# Patient Record
Sex: Female | Born: 1945 | Race: White | Hispanic: No | Smoking: Never smoker
Health system: Southern US, Community
[De-identification: ages and names within clinical notes are randomized; demographics above are authoritative.]

## PROBLEM LIST (undated history)

## (undated) DIAGNOSIS — E119 Type 2 diabetes mellitus without complications: Secondary | ICD-10-CM

## (undated) DIAGNOSIS — E785 Hyperlipidemia, unspecified: Secondary | ICD-10-CM

## (undated) DIAGNOSIS — L570 Actinic keratosis: Secondary | ICD-10-CM

## (undated) DIAGNOSIS — Z6841 Body Mass Index (BMI) 40.0 and over, adult: Secondary | ICD-10-CM

## (undated) DIAGNOSIS — Z87442 Personal history of urinary calculi: Secondary | ICD-10-CM

## (undated) DIAGNOSIS — K219 Gastro-esophageal reflux disease without esophagitis: Secondary | ICD-10-CM

## (undated) DIAGNOSIS — D649 Anemia, unspecified: Secondary | ICD-10-CM

## (undated) DIAGNOSIS — R011 Cardiac murmur, unspecified: Secondary | ICD-10-CM

## (undated) DIAGNOSIS — I10 Essential (primary) hypertension: Secondary | ICD-10-CM

## (undated) DIAGNOSIS — I350 Nonrheumatic aortic (valve) stenosis: Secondary | ICD-10-CM

## (undated) DIAGNOSIS — F419 Anxiety disorder, unspecified: Secondary | ICD-10-CM

## (undated) DIAGNOSIS — Z794 Long term (current) use of insulin: Secondary | ICD-10-CM

## (undated) DIAGNOSIS — H532 Diplopia: Secondary | ICD-10-CM

## (undated) DIAGNOSIS — I499 Cardiac arrhythmia, unspecified: Secondary | ICD-10-CM

## (undated) DIAGNOSIS — I4891 Unspecified atrial fibrillation: Secondary | ICD-10-CM

## (undated) HISTORY — DX: Hyperlipidemia, unspecified: E78.5

## (undated) HISTORY — DX: Diplopia: H53.2

## (undated) HISTORY — DX: Anxiety disorder, unspecified: F41.9

## (undated) HISTORY — DX: Type 2 diabetes mellitus without complications: Z79.4

## (undated) HISTORY — DX: Type 2 diabetes mellitus without complications: E11.9

## (undated) HISTORY — DX: Actinic keratosis: L57.0

## (undated) HISTORY — DX: Essential (primary) hypertension: I10

## (undated) HISTORY — DX: Gastro-esophageal reflux disease without esophagitis: K21.9

## (undated) HISTORY — DX: Morbid (severe) obesity due to excess calories: E66.01

## (undated) HISTORY — DX: Body Mass Index (BMI) 40.0 and over, adult: Z684

---

## 1995-03-30 HISTORY — PX: BREAST BIOPSY: SHX20

## 1997-07-30 ENCOUNTER — Other Ambulatory Visit: Admission: RE | Admit: 1997-07-30 | Discharge: 1997-07-30 | Payer: Self-pay | Admitting: Obstetrics and Gynecology

## 1997-10-27 ENCOUNTER — Emergency Department (HOSPITAL_COMMUNITY): Admission: EM | Admit: 1997-10-27 | Discharge: 1997-10-27 | Payer: Self-pay | Admitting: Emergency Medicine

## 1997-10-28 ENCOUNTER — Ambulatory Visit (HOSPITAL_COMMUNITY): Admission: RE | Admit: 1997-10-28 | Discharge: 1997-10-28 | Payer: Self-pay | Admitting: Emergency Medicine

## 1997-11-25 ENCOUNTER — Ambulatory Visit (HOSPITAL_COMMUNITY): Admission: RE | Admit: 1997-11-25 | Discharge: 1997-11-25 | Payer: Self-pay | Admitting: Urology

## 1998-08-01 ENCOUNTER — Other Ambulatory Visit: Admission: RE | Admit: 1998-08-01 | Discharge: 1998-08-01 | Payer: Self-pay | Admitting: Obstetrics and Gynecology

## 1999-03-03 ENCOUNTER — Encounter: Admission: RE | Admit: 1999-03-03 | Discharge: 1999-03-03 | Payer: Self-pay | Admitting: Family Medicine

## 1999-03-03 ENCOUNTER — Encounter: Payer: Self-pay | Admitting: Family Medicine

## 1999-08-03 ENCOUNTER — Other Ambulatory Visit: Admission: RE | Admit: 1999-08-03 | Discharge: 1999-08-03 | Payer: Self-pay | Admitting: Obstetrics and Gynecology

## 1999-09-18 ENCOUNTER — Other Ambulatory Visit: Admission: RE | Admit: 1999-09-18 | Discharge: 1999-09-18 | Payer: Self-pay | Admitting: Obstetrics and Gynecology

## 1999-09-18 ENCOUNTER — Encounter (INDEPENDENT_AMBULATORY_CARE_PROVIDER_SITE_OTHER): Payer: Self-pay

## 2000-03-03 ENCOUNTER — Encounter: Admission: RE | Admit: 2000-03-03 | Discharge: 2000-03-03 | Payer: Self-pay | Admitting: Obstetrics and Gynecology

## 2000-03-03 ENCOUNTER — Encounter: Payer: Self-pay | Admitting: Obstetrics and Gynecology

## 2000-08-08 ENCOUNTER — Other Ambulatory Visit: Admission: RE | Admit: 2000-08-08 | Discharge: 2000-08-08 | Payer: Self-pay | Admitting: Obstetrics and Gynecology

## 2001-03-14 ENCOUNTER — Encounter: Payer: Self-pay | Admitting: Obstetrics and Gynecology

## 2001-03-14 ENCOUNTER — Encounter: Admission: RE | Admit: 2001-03-14 | Discharge: 2001-03-14 | Payer: Self-pay | Admitting: Obstetrics and Gynecology

## 2001-07-24 ENCOUNTER — Encounter: Admission: RE | Admit: 2001-07-24 | Discharge: 2001-10-22 | Payer: Self-pay | Admitting: Family Medicine

## 2001-09-01 ENCOUNTER — Other Ambulatory Visit: Admission: RE | Admit: 2001-09-01 | Discharge: 2001-09-01 | Payer: Self-pay | Admitting: Obstetrics and Gynecology

## 2002-03-19 ENCOUNTER — Encounter: Payer: Self-pay | Admitting: Obstetrics and Gynecology

## 2002-03-19 ENCOUNTER — Encounter: Admission: RE | Admit: 2002-03-19 | Discharge: 2002-03-19 | Payer: Self-pay | Admitting: Obstetrics and Gynecology

## 2002-10-22 ENCOUNTER — Other Ambulatory Visit: Admission: RE | Admit: 2002-10-22 | Discharge: 2002-10-22 | Payer: Self-pay | Admitting: Obstetrics and Gynecology

## 2003-03-21 ENCOUNTER — Encounter: Admission: RE | Admit: 2003-03-21 | Discharge: 2003-03-21 | Payer: Self-pay | Admitting: Obstetrics and Gynecology

## 2003-11-12 ENCOUNTER — Other Ambulatory Visit: Admission: RE | Admit: 2003-11-12 | Discharge: 2003-11-12 | Payer: Self-pay | Admitting: Obstetrics and Gynecology

## 2004-03-27 ENCOUNTER — Ambulatory Visit (HOSPITAL_COMMUNITY): Admission: RE | Admit: 2004-03-27 | Discharge: 2004-03-27 | Payer: Self-pay | Admitting: Family Medicine

## 2004-12-09 ENCOUNTER — Other Ambulatory Visit: Admission: RE | Admit: 2004-12-09 | Discharge: 2004-12-09 | Payer: Self-pay | Admitting: Obstetrics and Gynecology

## 2005-04-01 ENCOUNTER — Ambulatory Visit (HOSPITAL_COMMUNITY): Admission: RE | Admit: 2005-04-01 | Discharge: 2005-04-01 | Payer: Self-pay | Admitting: Obstetrics and Gynecology

## 2007-02-09 ENCOUNTER — Ambulatory Visit (HOSPITAL_COMMUNITY): Admission: RE | Admit: 2007-02-09 | Discharge: 2007-02-09 | Payer: Self-pay | Admitting: Family Medicine

## 2008-02-12 ENCOUNTER — Ambulatory Visit (HOSPITAL_COMMUNITY): Admission: RE | Admit: 2008-02-12 | Discharge: 2008-02-12 | Payer: Self-pay | Admitting: Family Medicine

## 2009-02-12 ENCOUNTER — Ambulatory Visit (HOSPITAL_COMMUNITY): Admission: RE | Admit: 2009-02-12 | Discharge: 2009-02-12 | Payer: Self-pay | Admitting: Family Medicine

## 2011-07-12 DIAGNOSIS — G619 Inflammatory polyneuropathy, unspecified: Secondary | ICD-10-CM | POA: Diagnosis not present

## 2011-07-12 DIAGNOSIS — E119 Type 2 diabetes mellitus without complications: Secondary | ICD-10-CM | POA: Diagnosis not present

## 2011-07-12 DIAGNOSIS — G622 Polyneuropathy due to other toxic agents: Secondary | ICD-10-CM | POA: Diagnosis not present

## 2011-07-12 DIAGNOSIS — K219 Gastro-esophageal reflux disease without esophagitis: Secondary | ICD-10-CM | POA: Diagnosis not present

## 2011-07-12 DIAGNOSIS — I10 Essential (primary) hypertension: Secondary | ICD-10-CM | POA: Diagnosis not present

## 2011-07-12 DIAGNOSIS — E782 Mixed hyperlipidemia: Secondary | ICD-10-CM | POA: Diagnosis not present

## 2011-10-21 DIAGNOSIS — Z85828 Personal history of other malignant neoplasm of skin: Secondary | ICD-10-CM | POA: Diagnosis not present

## 2011-11-09 DIAGNOSIS — E119 Type 2 diabetes mellitus without complications: Secondary | ICD-10-CM | POA: Diagnosis not present

## 2011-11-09 DIAGNOSIS — H25019 Cortical age-related cataract, unspecified eye: Secondary | ICD-10-CM | POA: Diagnosis not present

## 2011-11-09 DIAGNOSIS — H2510 Age-related nuclear cataract, unspecified eye: Secondary | ICD-10-CM | POA: Diagnosis not present

## 2012-01-10 DIAGNOSIS — E782 Mixed hyperlipidemia: Secondary | ICD-10-CM | POA: Diagnosis not present

## 2012-01-10 DIAGNOSIS — G622 Polyneuropathy due to other toxic agents: Secondary | ICD-10-CM | POA: Diagnosis not present

## 2012-01-10 DIAGNOSIS — K219 Gastro-esophageal reflux disease without esophagitis: Secondary | ICD-10-CM | POA: Diagnosis not present

## 2012-01-10 DIAGNOSIS — Z23 Encounter for immunization: Secondary | ICD-10-CM | POA: Diagnosis not present

## 2012-01-10 DIAGNOSIS — G619 Inflammatory polyneuropathy, unspecified: Secondary | ICD-10-CM | POA: Diagnosis not present

## 2012-01-10 DIAGNOSIS — I10 Essential (primary) hypertension: Secondary | ICD-10-CM | POA: Diagnosis not present

## 2012-01-10 DIAGNOSIS — E119 Type 2 diabetes mellitus without complications: Secondary | ICD-10-CM | POA: Diagnosis not present

## 2012-04-18 DIAGNOSIS — Z124 Encounter for screening for malignant neoplasm of cervix: Secondary | ICD-10-CM | POA: Diagnosis not present

## 2012-04-18 DIAGNOSIS — Z1231 Encounter for screening mammogram for malignant neoplasm of breast: Secondary | ICD-10-CM | POA: Diagnosis not present

## 2012-06-30 DIAGNOSIS — E119 Type 2 diabetes mellitus without complications: Secondary | ICD-10-CM | POA: Diagnosis not present

## 2012-06-30 DIAGNOSIS — E782 Mixed hyperlipidemia: Secondary | ICD-10-CM | POA: Diagnosis not present

## 2012-06-30 DIAGNOSIS — I10 Essential (primary) hypertension: Secondary | ICD-10-CM | POA: Diagnosis not present

## 2012-10-23 DIAGNOSIS — L819 Disorder of pigmentation, unspecified: Secondary | ICD-10-CM | POA: Diagnosis not present

## 2012-10-23 DIAGNOSIS — D235 Other benign neoplasm of skin of trunk: Secondary | ICD-10-CM | POA: Diagnosis not present

## 2012-10-23 DIAGNOSIS — L570 Actinic keratosis: Secondary | ICD-10-CM | POA: Diagnosis not present

## 2012-10-23 DIAGNOSIS — Z85828 Personal history of other malignant neoplasm of skin: Secondary | ICD-10-CM | POA: Diagnosis not present

## 2012-11-08 DIAGNOSIS — E119 Type 2 diabetes mellitus without complications: Secondary | ICD-10-CM | POA: Diagnosis not present

## 2013-01-01 DIAGNOSIS — E782 Mixed hyperlipidemia: Secondary | ICD-10-CM | POA: Diagnosis not present

## 2013-01-01 DIAGNOSIS — I10 Essential (primary) hypertension: Secondary | ICD-10-CM | POA: Diagnosis not present

## 2013-01-01 DIAGNOSIS — Z23 Encounter for immunization: Secondary | ICD-10-CM | POA: Diagnosis not present

## 2013-01-01 DIAGNOSIS — IMO0001 Reserved for inherently not codable concepts without codable children: Secondary | ICD-10-CM | POA: Diagnosis not present

## 2013-04-24 DIAGNOSIS — Z01419 Encounter for gynecological examination (general) (routine) without abnormal findings: Secondary | ICD-10-CM | POA: Diagnosis not present

## 2013-04-24 DIAGNOSIS — Z1231 Encounter for screening mammogram for malignant neoplasm of breast: Secondary | ICD-10-CM | POA: Diagnosis not present

## 2013-04-25 DIAGNOSIS — M899 Disorder of bone, unspecified: Secondary | ICD-10-CM | POA: Diagnosis not present

## 2013-04-25 DIAGNOSIS — M949 Disorder of cartilage, unspecified: Secondary | ICD-10-CM | POA: Diagnosis not present

## 2013-04-25 DIAGNOSIS — N959 Unspecified menopausal and perimenopausal disorder: Secondary | ICD-10-CM | POA: Diagnosis not present

## 2013-07-09 DIAGNOSIS — I10 Essential (primary) hypertension: Secondary | ICD-10-CM | POA: Diagnosis not present

## 2013-07-09 DIAGNOSIS — K219 Gastro-esophageal reflux disease without esophagitis: Secondary | ICD-10-CM | POA: Diagnosis not present

## 2013-07-09 DIAGNOSIS — E1142 Type 2 diabetes mellitus with diabetic polyneuropathy: Secondary | ICD-10-CM | POA: Diagnosis not present

## 2013-07-09 DIAGNOSIS — E1149 Type 2 diabetes mellitus with other diabetic neurological complication: Secondary | ICD-10-CM | POA: Diagnosis not present

## 2013-07-09 DIAGNOSIS — E782 Mixed hyperlipidemia: Secondary | ICD-10-CM | POA: Diagnosis not present

## 2013-07-09 DIAGNOSIS — Z23 Encounter for immunization: Secondary | ICD-10-CM | POA: Diagnosis not present

## 2013-10-24 DIAGNOSIS — D235 Other benign neoplasm of skin of trunk: Secondary | ICD-10-CM | POA: Diagnosis not present

## 2013-10-24 DIAGNOSIS — L570 Actinic keratosis: Secondary | ICD-10-CM | POA: Diagnosis not present

## 2013-10-24 DIAGNOSIS — Z85828 Personal history of other malignant neoplasm of skin: Secondary | ICD-10-CM | POA: Diagnosis not present

## 2013-10-24 DIAGNOSIS — L819 Disorder of pigmentation, unspecified: Secondary | ICD-10-CM | POA: Diagnosis not present

## 2013-11-08 DIAGNOSIS — E119 Type 2 diabetes mellitus without complications: Secondary | ICD-10-CM | POA: Diagnosis not present

## 2014-01-15 DIAGNOSIS — E780 Pure hypercholesterolemia: Secondary | ICD-10-CM | POA: Diagnosis not present

## 2014-01-15 DIAGNOSIS — E1142 Type 2 diabetes mellitus with diabetic polyneuropathy: Secondary | ICD-10-CM | POA: Diagnosis not present

## 2014-01-15 DIAGNOSIS — I10 Essential (primary) hypertension: Secondary | ICD-10-CM | POA: Diagnosis not present

## 2014-02-06 DIAGNOSIS — Z23 Encounter for immunization: Secondary | ICD-10-CM | POA: Diagnosis not present

## 2014-02-28 DIAGNOSIS — J029 Acute pharyngitis, unspecified: Secondary | ICD-10-CM | POA: Diagnosis not present

## 2014-04-15 DIAGNOSIS — L570 Actinic keratosis: Secondary | ICD-10-CM | POA: Diagnosis not present

## 2014-05-07 DIAGNOSIS — N390 Urinary tract infection, site not specified: Secondary | ICD-10-CM | POA: Diagnosis not present

## 2014-05-15 DIAGNOSIS — N390 Urinary tract infection, site not specified: Secondary | ICD-10-CM | POA: Diagnosis not present

## 2014-06-11 DIAGNOSIS — Z124 Encounter for screening for malignant neoplasm of cervix: Secondary | ICD-10-CM | POA: Diagnosis not present

## 2014-06-11 DIAGNOSIS — Z1231 Encounter for screening mammogram for malignant neoplasm of breast: Secondary | ICD-10-CM | POA: Diagnosis not present

## 2014-06-11 DIAGNOSIS — Z6841 Body Mass Index (BMI) 40.0 and over, adult: Secondary | ICD-10-CM | POA: Diagnosis not present

## 2014-07-08 DIAGNOSIS — E1142 Type 2 diabetes mellitus with diabetic polyneuropathy: Secondary | ICD-10-CM | POA: Diagnosis not present

## 2014-07-08 DIAGNOSIS — F419 Anxiety disorder, unspecified: Secondary | ICD-10-CM | POA: Diagnosis not present

## 2014-07-08 DIAGNOSIS — E782 Mixed hyperlipidemia: Secondary | ICD-10-CM | POA: Diagnosis not present

## 2014-07-08 DIAGNOSIS — E119 Type 2 diabetes mellitus without complications: Secondary | ICD-10-CM | POA: Diagnosis not present

## 2014-07-08 DIAGNOSIS — I10 Essential (primary) hypertension: Secondary | ICD-10-CM | POA: Diagnosis not present

## 2014-07-08 DIAGNOSIS — J302 Other seasonal allergic rhinitis: Secondary | ICD-10-CM | POA: Diagnosis not present

## 2014-07-08 DIAGNOSIS — K219 Gastro-esophageal reflux disease without esophagitis: Secondary | ICD-10-CM | POA: Diagnosis not present

## 2014-07-23 DIAGNOSIS — Z1389 Encounter for screening for other disorder: Secondary | ICD-10-CM | POA: Diagnosis not present

## 2014-07-23 DIAGNOSIS — R350 Frequency of micturition: Secondary | ICD-10-CM | POA: Diagnosis not present

## 2014-07-23 DIAGNOSIS — R310 Gross hematuria: Secondary | ICD-10-CM | POA: Diagnosis not present

## 2014-07-23 DIAGNOSIS — N390 Urinary tract infection, site not specified: Secondary | ICD-10-CM | POA: Diagnosis not present

## 2014-10-30 DIAGNOSIS — E119 Type 2 diabetes mellitus without complications: Secondary | ICD-10-CM | POA: Diagnosis not present

## 2014-10-30 DIAGNOSIS — R319 Hematuria, unspecified: Secondary | ICD-10-CM | POA: Diagnosis not present

## 2014-10-30 DIAGNOSIS — E782 Mixed hyperlipidemia: Secondary | ICD-10-CM | POA: Diagnosis not present

## 2014-11-14 DIAGNOSIS — H2513 Age-related nuclear cataract, bilateral: Secondary | ICD-10-CM | POA: Diagnosis not present

## 2014-12-23 DIAGNOSIS — L570 Actinic keratosis: Secondary | ICD-10-CM | POA: Diagnosis not present

## 2014-12-23 DIAGNOSIS — L821 Other seborrheic keratosis: Secondary | ICD-10-CM | POA: Diagnosis not present

## 2014-12-23 DIAGNOSIS — D225 Melanocytic nevi of trunk: Secondary | ICD-10-CM | POA: Diagnosis not present

## 2014-12-23 DIAGNOSIS — L814 Other melanin hyperpigmentation: Secondary | ICD-10-CM | POA: Diagnosis not present

## 2014-12-30 DIAGNOSIS — E782 Mixed hyperlipidemia: Secondary | ICD-10-CM | POA: Diagnosis not present

## 2014-12-30 DIAGNOSIS — Z1211 Encounter for screening for malignant neoplasm of colon: Secondary | ICD-10-CM | POA: Diagnosis not present

## 2014-12-30 DIAGNOSIS — K219 Gastro-esophageal reflux disease without esophagitis: Secondary | ICD-10-CM | POA: Diagnosis not present

## 2014-12-30 DIAGNOSIS — F419 Anxiety disorder, unspecified: Secondary | ICD-10-CM | POA: Diagnosis not present

## 2014-12-30 DIAGNOSIS — Z23 Encounter for immunization: Secondary | ICD-10-CM | POA: Diagnosis not present

## 2014-12-30 DIAGNOSIS — I10 Essential (primary) hypertension: Secondary | ICD-10-CM | POA: Diagnosis not present

## 2014-12-30 DIAGNOSIS — E1142 Type 2 diabetes mellitus with diabetic polyneuropathy: Secondary | ICD-10-CM | POA: Diagnosis not present

## 2015-02-26 DIAGNOSIS — K5730 Diverticulosis of large intestine without perforation or abscess without bleeding: Secondary | ICD-10-CM | POA: Diagnosis not present

## 2015-02-26 DIAGNOSIS — K5520 Angiodysplasia of colon without hemorrhage: Secondary | ICD-10-CM | POA: Diagnosis not present

## 2015-02-26 DIAGNOSIS — K640 First degree hemorrhoids: Secondary | ICD-10-CM | POA: Diagnosis not present

## 2015-02-26 DIAGNOSIS — Z1211 Encounter for screening for malignant neoplasm of colon: Secondary | ICD-10-CM | POA: Diagnosis not present

## 2015-05-07 DIAGNOSIS — I10 Essential (primary) hypertension: Secondary | ICD-10-CM | POA: Diagnosis not present

## 2015-05-07 DIAGNOSIS — Z7984 Long term (current) use of oral hypoglycemic drugs: Secondary | ICD-10-CM | POA: Diagnosis not present

## 2015-05-07 DIAGNOSIS — Z6841 Body Mass Index (BMI) 40.0 and over, adult: Secondary | ICD-10-CM | POA: Diagnosis not present

## 2015-05-07 DIAGNOSIS — K219 Gastro-esophageal reflux disease without esophagitis: Secondary | ICD-10-CM | POA: Diagnosis not present

## 2015-05-07 DIAGNOSIS — E1142 Type 2 diabetes mellitus with diabetic polyneuropathy: Secondary | ICD-10-CM | POA: Diagnosis not present

## 2015-07-21 DIAGNOSIS — E782 Mixed hyperlipidemia: Secondary | ICD-10-CM | POA: Diagnosis not present

## 2015-07-21 DIAGNOSIS — E1142 Type 2 diabetes mellitus with diabetic polyneuropathy: Secondary | ICD-10-CM | POA: Diagnosis not present

## 2015-07-21 DIAGNOSIS — K219 Gastro-esophageal reflux disease without esophagitis: Secondary | ICD-10-CM | POA: Diagnosis not present

## 2015-07-21 DIAGNOSIS — Z23 Encounter for immunization: Secondary | ICD-10-CM | POA: Diagnosis not present

## 2015-07-21 DIAGNOSIS — Z6841 Body Mass Index (BMI) 40.0 and over, adult: Secondary | ICD-10-CM | POA: Diagnosis not present

## 2015-07-21 DIAGNOSIS — F411 Generalized anxiety disorder: Secondary | ICD-10-CM | POA: Diagnosis not present

## 2015-07-21 DIAGNOSIS — I10 Essential (primary) hypertension: Secondary | ICD-10-CM | POA: Diagnosis not present

## 2015-07-21 DIAGNOSIS — J309 Allergic rhinitis, unspecified: Secondary | ICD-10-CM | POA: Diagnosis not present

## 2015-08-04 DIAGNOSIS — N390 Urinary tract infection, site not specified: Secondary | ICD-10-CM | POA: Diagnosis not present

## 2015-08-06 DIAGNOSIS — Z1231 Encounter for screening mammogram for malignant neoplasm of breast: Secondary | ICD-10-CM | POA: Diagnosis not present

## 2015-08-06 DIAGNOSIS — M8588 Other specified disorders of bone density and structure, other site: Secondary | ICD-10-CM | POA: Diagnosis not present

## 2015-08-06 DIAGNOSIS — Z01419 Encounter for gynecological examination (general) (routine) without abnormal findings: Secondary | ICD-10-CM | POA: Diagnosis not present

## 2015-08-06 DIAGNOSIS — Z6841 Body Mass Index (BMI) 40.0 and over, adult: Secondary | ICD-10-CM | POA: Diagnosis not present

## 2015-08-06 DIAGNOSIS — N958 Other specified menopausal and perimenopausal disorders: Secondary | ICD-10-CM | POA: Diagnosis not present

## 2015-08-12 DIAGNOSIS — R6883 Chills (without fever): Secondary | ICD-10-CM | POA: Diagnosis not present

## 2015-11-18 DIAGNOSIS — E119 Type 2 diabetes mellitus without complications: Secondary | ICD-10-CM | POA: Diagnosis not present

## 2015-11-18 DIAGNOSIS — Z0100 Encounter for examination of eyes and vision without abnormal findings: Secondary | ICD-10-CM | POA: Diagnosis not present

## 2015-11-18 DIAGNOSIS — H25013 Cortical age-related cataract, bilateral: Secondary | ICD-10-CM | POA: Diagnosis not present

## 2015-11-18 DIAGNOSIS — H2513 Age-related nuclear cataract, bilateral: Secondary | ICD-10-CM | POA: Diagnosis not present

## 2016-01-14 DIAGNOSIS — I10 Essential (primary) hypertension: Secondary | ICD-10-CM | POA: Diagnosis not present

## 2016-01-14 DIAGNOSIS — Z6841 Body Mass Index (BMI) 40.0 and over, adult: Secondary | ICD-10-CM | POA: Diagnosis not present

## 2016-01-14 DIAGNOSIS — E782 Mixed hyperlipidemia: Secondary | ICD-10-CM | POA: Diagnosis not present

## 2016-01-14 DIAGNOSIS — E1142 Type 2 diabetes mellitus with diabetic polyneuropathy: Secondary | ICD-10-CM | POA: Diagnosis not present

## 2016-01-14 DIAGNOSIS — F411 Generalized anxiety disorder: Secondary | ICD-10-CM | POA: Diagnosis not present

## 2016-01-14 DIAGNOSIS — Z23 Encounter for immunization: Secondary | ICD-10-CM | POA: Diagnosis not present

## 2016-01-14 DIAGNOSIS — K219 Gastro-esophageal reflux disease without esophagitis: Secondary | ICD-10-CM | POA: Diagnosis not present

## 2016-04-21 DIAGNOSIS — E1142 Type 2 diabetes mellitus with diabetic polyneuropathy: Secondary | ICD-10-CM | POA: Diagnosis not present

## 2016-07-14 DIAGNOSIS — Z794 Long term (current) use of insulin: Secondary | ICD-10-CM | POA: Diagnosis not present

## 2016-07-14 DIAGNOSIS — E1142 Type 2 diabetes mellitus with diabetic polyneuropathy: Secondary | ICD-10-CM | POA: Diagnosis not present

## 2016-07-14 DIAGNOSIS — Z6841 Body Mass Index (BMI) 40.0 and over, adult: Secondary | ICD-10-CM | POA: Diagnosis not present

## 2016-07-14 DIAGNOSIS — Z0000 Encounter for general adult medical examination without abnormal findings: Secondary | ICD-10-CM | POA: Diagnosis not present

## 2016-07-14 DIAGNOSIS — I10 Essential (primary) hypertension: Secondary | ICD-10-CM | POA: Diagnosis not present

## 2016-07-14 DIAGNOSIS — E782 Mixed hyperlipidemia: Secondary | ICD-10-CM | POA: Diagnosis not present

## 2016-07-14 DIAGNOSIS — F411 Generalized anxiety disorder: Secondary | ICD-10-CM | POA: Diagnosis not present

## 2016-07-14 DIAGNOSIS — K219 Gastro-esophageal reflux disease without esophagitis: Secondary | ICD-10-CM | POA: Diagnosis not present

## 2016-11-17 DIAGNOSIS — H5203 Hypermetropia, bilateral: Secondary | ICD-10-CM | POA: Diagnosis not present

## 2016-11-17 DIAGNOSIS — H524 Presbyopia: Secondary | ICD-10-CM | POA: Diagnosis not present

## 2016-11-17 DIAGNOSIS — E119 Type 2 diabetes mellitus without complications: Secondary | ICD-10-CM | POA: Diagnosis not present

## 2016-11-17 DIAGNOSIS — H2513 Age-related nuclear cataract, bilateral: Secondary | ICD-10-CM | POA: Diagnosis not present

## 2016-12-13 DIAGNOSIS — Z6841 Body Mass Index (BMI) 40.0 and over, adult: Secondary | ICD-10-CM | POA: Diagnosis not present

## 2016-12-13 DIAGNOSIS — R351 Nocturia: Secondary | ICD-10-CM | POA: Diagnosis not present

## 2016-12-13 DIAGNOSIS — R3915 Urgency of urination: Secondary | ICD-10-CM | POA: Diagnosis not present

## 2016-12-13 DIAGNOSIS — R350 Frequency of micturition: Secondary | ICD-10-CM | POA: Diagnosis not present

## 2016-12-13 DIAGNOSIS — Z1231 Encounter for screening mammogram for malignant neoplasm of breast: Secondary | ICD-10-CM | POA: Diagnosis not present

## 2016-12-13 DIAGNOSIS — Z01419 Encounter for gynecological examination (general) (routine) without abnormal findings: Secondary | ICD-10-CM | POA: Diagnosis not present

## 2016-12-29 DIAGNOSIS — I200 Unstable angina: Secondary | ICD-10-CM | POA: Diagnosis not present

## 2016-12-30 ENCOUNTER — Telehealth: Payer: Self-pay | Admitting: Cardiology

## 2016-12-30 NOTE — Telephone Encounter (Signed)
Received records from [place] for appointment on 12/31/16 with Dr Ellyn Hack.  Records put with Dr Allison Quarry schedule for 12/31/16. lp

## 2016-12-31 ENCOUNTER — Encounter: Payer: Self-pay | Admitting: Cardiology

## 2016-12-31 ENCOUNTER — Ambulatory Visit (INDEPENDENT_AMBULATORY_CARE_PROVIDER_SITE_OTHER): Payer: Medicare Other | Admitting: Cardiology

## 2016-12-31 ENCOUNTER — Telehealth: Payer: Self-pay | Admitting: *Deleted

## 2016-12-31 VITALS — BP 146/70 | HR 76 | Ht 62.0 in | Wt 245.0 lb

## 2016-12-31 DIAGNOSIS — Z794 Long term (current) use of insulin: Secondary | ICD-10-CM | POA: Diagnosis not present

## 2016-12-31 DIAGNOSIS — E785 Hyperlipidemia, unspecified: Secondary | ICD-10-CM | POA: Diagnosis not present

## 2016-12-31 DIAGNOSIS — E118 Type 2 diabetes mellitus with unspecified complications: Secondary | ICD-10-CM | POA: Diagnosis not present

## 2016-12-31 DIAGNOSIS — I10 Essential (primary) hypertension: Secondary | ICD-10-CM | POA: Diagnosis not present

## 2016-12-31 DIAGNOSIS — D689 Coagulation defect, unspecified: Secondary | ICD-10-CM | POA: Diagnosis not present

## 2016-12-31 DIAGNOSIS — Z01818 Encounter for other preprocedural examination: Secondary | ICD-10-CM | POA: Diagnosis not present

## 2016-12-31 DIAGNOSIS — R079 Chest pain, unspecified: Secondary | ICD-10-CM | POA: Insufficient documentation

## 2016-12-31 DIAGNOSIS — I200 Unstable angina: Secondary | ICD-10-CM | POA: Diagnosis not present

## 2016-12-31 MED ORDER — CARVEDILOL 3.125 MG PO TABS: 3.1250 mg | ORAL_TABLET | Freq: Two times a day (BID) | ORAL | 3 refills | Status: DC

## 2016-12-31 MED ORDER — NITROGLYCERIN 0.4 MG SL SUBL: 0.4000 mg | SUBLINGUAL_TABLET | SUBLINGUAL | 5 refills | Status: AC | PRN

## 2016-12-31 NOTE — Progress Notes (Signed)
PCP: Orpah Melter, MD  Clinic Note: Chief Complaint  Patient presents with  . New Patient (Initial Visit)    "Unstable Angina"  . Chest Pain    Pressure with right jaw pain.    HPI: [name] is a 71 y.o. female who is being seen today for the evaluation of "Unstable angina " at the request of Starlyn Skeans, [place]. [name] is a morbidly obese woman with cardiac risk factors of insulin-dependent type 2 diabetes, hypertension, hyperlipidemia. She has a family history notable for her mother having a stroke and one of her 3 brothers having coronary disease as well as PAD. All of her brothers (3) and one sister have at least diabetes if not accommodation of diabetes, hypertension and hyperlipidemia.  [name] was last seen on October 3 by Starlyn Skeans, PA-C to evaluate jaw pain and chest heaviness. She was urgently referred for cardiology consultation.  Recent Hospitalizations: None  Studies Personally Reviewed - (if available, images/films reviewed: From Epic Chart or Care Everywhere)  None  Interval History: [name] presents here today stating that about a week ago she had a prolonged episode (5-10 minutes) of a heavy pressure sensation in the center of her chest that started off actually with pain in her right jaw. She initially thought that maybe this was a symptom of her blood sugars being reduced because she started feeling sweaty shaky and clammy. The significant discomfort went away after about 5 minutes, but her general feeling of the knees and jitteriness lasted about 2 hours. (He did not mention to me, but she did mention to [facility] that her chest heaviness and jaw pain then recurred later on that afternoon. At that time both her blood pressure heart rate and blood sugars were fine. This episode did not last as long and was not associated with nearly as much of the sweating and shakiness. The first episode occurred while she was crocheting a quilt. The  second episode was also at rest She did not notice significant dyspnea associated with these episodes, because she was really more focusing on her uneasiness. Her sister however did indicate that the one episode she saw (the longer lasting one) was associated with what looked like somewhat labored breathing. The second episode was not as prominent.  She has not had any further episodes, but has really been scared to do much of anything since. We didn't do much of any type of activity while she was feeling the symptoms therefore she is not able to tell me if they were exacerbated by exertion or not.  She has noted some shortness of breath lying down flat but has not had any PND symptoms. She has mild stable lower extremity swelling that goes down at night.   She notes occasional chest fluttering symptoms, but has not really noted any rapid irregular heartbeats or palpitations.  No syncope/near syncope. No TIA/amaurosis fugax symptoms. No melena, hematochezia, hematuria, or epstaxis. No claudication.  She notes that she was starting to have leg myalgias on atorvastatin and has recently switched to Livalo.  ROS: A comprehensive was performed. Review of Systems  Constitutional: Negative for malaise/fatigue and weight loss.  HENT: Negative for congestion.   Respiratory: Negative for cough and shortness of breath.   Cardiovascular:       Per history of present illness  Gastrointestinal: Negative for heartburn, nausea (Although she did feel somewhat nauseated when she was having the initial episode noted in history of present illness.) and vomiting.  Musculoskeletal: Negative  for joint pain and myalgias.  Neurological: Negative for dizziness and focal weakness.  Psychiatric/Behavioral: Negative for memory loss. The patient is nervous/anxious. The patient does not have insomnia.   All other systems reviewed and are negative.  I have reviewed and (if needed) personally updated the patient's  problem list, medications, allergies, past medical and surgical history, social and family history.   Past Medical History:  Diagnosis Date  . Actinic keratosis    Followed by Dr. Allyson Sabal  . Anxiety   . Diabetes mellitus, type II, insulin dependent ([place])    Associated with obesity: She takes Lantus, Victoza & Actos  . Essential hypertension   . GERD (gastroesophageal reflux disease)   . Hyperlipidemia   . Morbid obesity with BMI of 40.0-44.9, adult [facility])     Past Surgical History:  Procedure Laterality Date  . BREAST BIOPSY  1997    Current Meds  Medication Sig  . aspirin 325 MG tablet Take 325 mg by mouth daily as needed (chest pains).   Marland Kitchen LANTUS SOLOSTAR 100 UNIT/ML Solostar Pen Inject 50 Units into the skin daily. Before breakfast  . lisinopril-hydrochlorothiazide (PRINZIDE,ZESTORETIC) 20-12.5 MG tablet Take 1 tablet by mouth daily.  . pioglitazone (ACTOS) 30 MG tablet Take 30 mg by mouth every evening.   . [DISCONTINUED] Cholecalciferol (VITAMIN D PO) Take 1 tablet by mouth daily.  . [DISCONTINUED] Liraglutide (VICTOZA [place]) Inject 1.2 mg into the skin daily.  . [DISCONTINUED] Omega-3 Fatty Acids (FISH OIL PO) Take 1 tablet by mouth daily.  . [DISCONTINUED] Pitavastatin Calcium (LIVALO PO) Take 1 tablet by mouth daily.  . [DISCONTINUED] VITAMIN E PO Take 1 tablet by mouth daily.    Allergies  Allergen Reactions  . Sulfa Antibiotics Other (See Comments)    Itching and red spots.    Social History   Social History  . Marital status: Widowed    Spouse name: N/A  . Number of children: 3  . Years of education: 12   Occupational History  .      Retired   Social History Main Topics  . Smoking status: Never Smoker  . Smokeless tobacco: Never Used  . Alcohol use No  . Drug use: No  . Sexual activity: Not Currently   Other Topics Concern  . None   Social History Narrative   She is a widowed mother of 3 with 5 grandchildren. She lives with one of her daughters and  grandson. She is a coming by her sister. She tries to walk, but does not do it regularly.   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    family history includes Bladder Cancer in her brother; Coronary artery disease in her brother; Depression in her sister; Diabetes in her brother, brother, mother, and sister; Hyperlipidemia in her mother and sister; Hypertension in her sister; Kidney disease in her brother; Liver cancer in her father; Peripheral Artery Disease in her brother; Stroke (age of onset: 33) in her brother; Stroke (age of onset: 51) in her mother.  Wt Readings from Last 3 Encounters:  12/31/16 245 lb (111.1 kg)    PHYSICAL EXAM BP (!) 146/70   Pulse 76   Ht 5\' 2"  (1.575 m)   Wt 245 lb (111.1 kg)   BMI 44.81 kg/m  Physical Exam  Constitutional: She is oriented to person, place, and time. She appears well-developed and well-nourished. No distress.  Morbidly obese. Well groomed  HENT:  Head: Normocephalic and atraumatic.  Mouth/Throat: Oropharynx is  clear and moist. No oropharyngeal exudate.  Mallampati score = 2  Eyes: Pupils are equal, round, and reactive to light. Conjunctivae and EOM are normal. No scleral icterus.  Neck: Normal range of motion. Neck supple. No JVD (Unable to assess due to body habitus) present. Carotid bruit is not present.  Cardiovascular: Normal rate, regular rhythm, S1 normal, S2 normal, intact distal pulses and normal pulses.   No extrasystoles are present. PMI is not displaced (Unable to palpate).  Exam reveals distant heart sounds. Exam reveals no gallop.   No murmur heard. Pulmonary/Chest: Effort normal. No respiratory distress. She has no wheezes. She has no rales.  Distant breath sounds, but clear  Abdominal: Soft. Bowel sounds are normal. She exhibits no distension. There is no tenderness. There is no rebound.  Morbidly obese  Musculoskeletal: Normal range of motion. She exhibits no edema or deformity.  Neurological: She is alert  and oriented to person, place, and time. No cranial nerve deficit. She exhibits normal muscle tone. Coordination normal.  Skin: Skin is warm and dry. No rash noted. No erythema.  Psychiatric: She has a normal mood and affect. Her behavior is normal. Judgment and thought content normal.  Somewhat anxious  Nursing note and vitals reviewed.    Adult ECG Report Interpretation of EKG from PCP 12/29/2016: Rate: 69 ;  Rhythm: normal sinus rhythm and borderline low voltage in precordial leads (likely related to have his). As such there are nonspecific ST and T-wave changes.;   Narrative Interpretation: Essentially normal EKG.  Today: Rate: 76 ;  Rhythm: normal sinus rhythm and borderline voltage for LVH in limb leads. Nonspecific, but otherwise no significant ST and T-wave changes.;   Narrative Interpretation: Relatively normal EKG   Other studies Reviewed: Additional studies/ records that were reviewed today include:  Recent Labs:  None available   ASSESSMENT / PLAN: Problem List Items Addressed This Visit    DM (diabetes mellitus), type 2 with complications ([employer]) (Chronic)    She is on Lantus, Actos and Victoza  She will need to hold the Lantus and Actos, but not necessarily Victoza for her cath.      Relevant Medications   lisinopril-hydrochlorothiazide (PRINZIDE,ZESTORETIC) 20-12.5 MG tablet   pioglitazone (ACTOS) 30 MG tablet   LANTUS SOLOSTAR 100 UNIT/ML Solostar Pen   aspirin 325 MG tablet   Other Relevant Orders   EKG 12-Lead   LEFT HEART CATHETERIZATION WITH CORONARY ANGIOGRAM   Essential hypertension (Chronic)    Her current blood pressure is low but high and she is on lisinopril and HCTZ. I will add carvedilol 3.125 mg twice a day mostly for cardiac protection.      Relevant Medications   lisinopril-hydrochlorothiazide (PRINZIDE,ZESTORETIC) 20-12.5 MG tablet   aspirin 325 MG tablet   nitroGLYCERIN (NITROSTAT) 0.4 MG SL tablet   carvedilol (COREG) 3.125 MG tablet    Hyperlipidemia with target low density lipoprotein (LDL) cholesterol less than 70 mg/dL (Chronic)    Converted from atorvastatin to Livalo secondary to myalgias. She is also on fish oil.      Relevant Medications   lisinopril-hydrochlorothiazide (PRINZIDE,ZESTORETIC) 20-12.5 MG tablet   aspirin 325 MG tablet   nitroGLYCERIN (NITROSTAT) 0.4 MG SL tablet   carvedilol (COREG) 3.125 MG tablet   Other Relevant Orders   LEFT HEART CATHETERIZATION WITH CORONARY ANGIOGRAM   Unstable angina (HCC) - Primary    71 year old woman with multiple cardiac risk factors (hypertension, hyperlipidemia, diabetes mellitus on insulin and obesity = metabolic syndrome) with 1 prolonged followed  by a second less notable episode of substernal chest pressure radiating to the jaw associated with some dyspnea and other classic symptoms for angina at rest. She has not had any further episodes which is somewhat atypical, however these episodes were quite convincing.  I am concerned that she has a high likelihood for having coronary disease based on these symptoms and her risk factors. I'm also concerned that we may not get adequate evaluation from noninvasive testing: Likely poor images on CT as well as Myoview. Cannot guarantee good images on echocardiogram.  After discussing various options with the patient and voicing my opinion a preference, we decided to agree that the best course of action would be to proceed with diagnostic cardiac catheterization.  Plan: Schedule cardiac catheterization for next week.  start carvedilol 3.125 mg twice a day and provide sublingual nitroglycerin.   Continue daily aspirin  I also asked that she refrain from any significant activity until after the catheterization.  Hold Lantus AM of cath, but ok to take Victoza.  Hold Actos AM of cath.  CONSENT:  Performing MD:  Glenetta Hew, M.D., M.S.  Procedure:  LEFT HEART CATHETERIZATION WITH CORONARY ANGIOGRAPHY and possible  PERCUTANEOUS CORONARY INTERVENTION.  The procedure with Risks/Benefits/Alternatives and Indications was reviewed with the patient and her sister.  All questions were answered.    Risks / Complications include, but not limited to: Death, MI, CVA/TIA, VF/VT (with defibrillation), Bradycardia (need for temporary pacer placement), contrast induced nephropathy, bleeding / bruising / hematoma / pseudoaneurysm, vascular or coronary injury (with possible emergent CT or Vascular Surgery), adverse medication reactions, infection.  Additional risks involving the use of radiation with the possibility of radiation burns and cancer were explained in detail.  The patient (and SISTER) voice understanding and agree to proceed.         Relevant Medications   lisinopril-hydrochlorothiazide (PRINZIDE,ZESTORETIC) 20-12.5 MG tablet   aspirin 325 MG tablet   nitroGLYCERIN (NITROSTAT) 0.4 MG SL tablet   carvedilol (COREG) 3.125 MG tablet   Other Relevant Orders   EKG 12-Lead   LEFT HEART CATHETERIZATION WITH CORONARY ANGIOGRAM   Basic metabolic panel (Completed)   Protime-INR (Completed)   CBC (Completed)    Other Visit Diagnoses    Pre-op testing       Relevant Orders   Basic metabolic panel (Completed)   Protime-INR (Completed)   CBC (Completed)   Clotting disorder ([place])       Relevant Orders   Protime-INR (Completed)      Current medicines are reviewed at length with the patient today. (+/- concerns) none The following changes have been made: Add Coreg & PRN NTG.   Patient Instructions    [place] [address] [place] [address] Dept: [phone] Loc: [phone]  [id number]  12/31/2016  You are scheduled for a Cardiac Catheterization on Wednesday, October 10 with Dr. Glenetta Hew.  1. Please arrive at the [facility] (Main Entrance A) at [facility]: [address]  [place], [place] [address] at 6:30 AM (two hours before your procedure to ensure your preparation). Free valet parking service is available.   Special note: Every effort is made to have your procedure done on time. Please understand that emergencies sometimes delay scheduled procedures.  2. Diet: Do not eat or drink anything after midnight prior to your procedure except sips of water to take medications.  3. Labs: DO LABS TODAY -- BMP , CBC ,  PT/INR  4. Medication instructions in preparation for your procedure:  START TAKING CARVEDILOL 3.125 MG TWICE A DAY STARTING 12/31/16 Stop taking, ACTOS on Wednesday, October 9.    Do not take  Insulin dose(Victoza) the night before your procedure. Do not take any insulin ( LANTUS) on the day of the procedure.   On the morning of your procedure, take your Aspirin 325 MG and any morning medicines NOT listed above.  You may use sips of water.  5. Plan for one night stay--bring personal belongings. 6. Bring a current list of your medications and current insurance cards. 7. You MUST have a responsible person to drive you home. 8. Someone MUST be with you the first 24 hours after you arrive home or your discharge will be delayed. 9. Please wear clothes that are easy to get on and off and wear slip-on shoes.  Thank you for allowing [place] to care for you!   -- [facility]    MAY USE  NITROGLYCERIN 0.4 MG SUBLINGUAL IF NEEDED   Your physician recommends that you schedule a follow-up appointment in 2 [place].    Nitroglycerin sublingual tablets What is this medicine? NITROGLYCERIN (nye troe GLI ser in) is a type of vasodilator. It relaxes blood vessels, increasing the blood and oxygen supply to your heart. This medicine is used to relieve chest pain caused by angina. It is also used to prevent chest pain before activities like climbing stairs, going outdoors in cold weather, or sexual activity. This medicine may be  used for other purposes; ask your health care provider or pharmacist if you have questions. COMMON BRAND NAME(S): Nitroquick, Nitrostat, Nitrotab What should I tell my health care provider before I take this medicine? They need to know if you have any of these conditions: -anemia -head injury, recent stroke, or bleeding in the brain -liver disease -previous heart attack -an unusual or allergic reaction to nitroglycerin, other medicines, foods, dyes, or preservatives -pregnant or trying to get pregnant -breast-feeding How should I use this medicine? Take this medicine by mouth as needed. At the first sign of an angina attack (chest pain or tightness) place one tablet under your tongue. You can also take this medicine 5 to 10 minutes before an event likely to produce chest pain. Follow the directions on the prescription label. Let the tablet dissolve under the tongue. Do not swallow whole. Replace the dose if you accidentally swallow it. It will help if your mouth is not dry. Saliva around the tablet will help it to dissolve more quickly. Do not eat or drink, smoke or chew tobacco while a tablet is dissolving. If you are not better within 5 minutes after taking ONE dose of nitroglycerin, call 9-1-1 immediately to seek emergency medical care. Do not take more than 3 nitroglycerin tablets over 15 minutes. If you take this medicine often to relieve symptoms of angina, your doctor or health care professional may provide you with different instructions to manage your symptoms. If symptoms do not go away after following these instructions, it is important to call 9-1-1 immediately. Do not take more than 3 nitroglycerin tablets over 15 minutes. Talk to your pediatrician regarding the use of this medicine in children. Special care may be needed. Overdosage: If you think you have taken too much of this medicine contact a poison control center or emergency room at once. NOTE: This medicine is only for you. Do  not share this medicine with others. What  if I miss a dose? This does not apply. This medicine is only used as needed. What may interact with this medicine? Do not take this medicine with any of the following medications: -certain migraine medicines like ergotamine and dihydroergotamine (DHE) -medicines used to treat erectile dysfunction like sildenafil, tadalafil, and vardenafil -riociguat This medicine may also interact with the following medications: -alteplase -aspirin -heparin -medicines for high blood pressure -medicines for mental depression -other medicines used to treat angina -phenothiazines like chlorpromazine, mesoridazine, prochlorperazine, thioridazine This list may not describe all possible interactions. Give your health care provider a list of all the medicines, herbs, non-prescription drugs, or dietary supplements you use. Also tell them if you smoke, drink alcohol, or use illegal drugs. Some items may interact with your medicine. What should I watch for while using this medicine? Tell your doctor or health care professional if you feel your medicine is no longer working. Keep this medicine with you at all times. Sit or lie down when you take your medicine to prevent falling if you feel dizzy or faint after using it. Try to remain calm. This will help you to feel better faster. If you feel dizzy, take several deep breaths and lie down with your feet propped up, or bend forward with your head resting between your knees. You may get drowsy or dizzy. Do not drive, use machinery, or do anything that needs mental alertness until you know how this drug affects you. Do not stand or sit up quickly, especially if you are an older patient. This reduces the risk of dizzy or fainting spells. Alcohol can make you more drowsy and dizzy. Avoid alcoholic drinks. Do not treat yourself for coughs, colds, or pain while you are taking this medicine without asking your doctor or health care  professional for advice. Some ingredients may increase your blood pressure. What side effects may I notice from receiving this medicine? Side effects that you should report to your doctor or health care professional as soon as possible: -blurred vision -dry mouth -skin rash -sweating -the feeling of extreme pressure in the head -unusually weak or tired Side effects that usually do not require medical attention (report to your doctor or health care professional if they continue or are bothersome): -flushing of the face or neck -headache -irregular heartbeat, palpitations -nausea, vomiting This list may not describe all possible side effects. Call your doctor for medical advice about side effects. You may report side effects to FDA at 1-800-FDA-1088. Where should I keep my medicine? Keep out of the reach of children. Store at room temperature between 20 and 25 degrees C (68 and 77 degrees F). Store in [employer]. Protect from light and moisture. Keep tightly closed. Throw away any unused medicine after the expiration date. NOTE: This sheet is a summary. It may not cover all possible information. If you have questions about this medicine, talk to your doctor, pharmacist, or health care provider.  2018 Elsevier/Gold Standard (2013-01-11 17:57:36)    Studies Ordered:   Orders Placed This Encounter  Procedures  . Basic metabolic panel  . Protime-INR  . CBC  . EKG 12-Lead  . LEFT HEART CATHETERIZATION WITH CORONARY Illene Silver, M.D., M.S. Interventional Cardiologist   Pager # [phone] Phone # [phone] [address] [place], [place] [address]

## 2016-12-31 NOTE — Telephone Encounter (Signed)
Spoke to patient and sister after office visit.  informed not to take  VIctoza or Lantus  Doses  The night before or the morning of the left heart cath .  Per instruction of extender.   verbalized understanding.

## 2016-12-31 NOTE — Patient Instructions (Addendum)
[facility] [address] [place] [address] Dept: [phone] Loc: [phone]  [id number]  12/31/2016  You are scheduled for a Cardiac Catheterization on Wednesday, October 10 with Dr. Glenetta Hew.  1. Please arrive at the [facility] (Main Entrance A) at [facility]: [address] [place], [place] [address] at 6:30 AM (two hours before your procedure to ensure your preparation). Free valet parking service is available.   Special note: Every effort is made to have your procedure done on time. Please understand that emergencies sometimes delay scheduled procedures.  2. Diet: Do not eat or drink anything after midnight prior to your procedure except sips of water to take medications.  3. Labs: DO LABS TODAY -- BMP , CBC , PT/INR  4. Medication instructions in preparation for your procedure:  START TAKING CARVEDILOL 3.125 MG TWICE A DAY STARTING 12/31/16 Stop taking, ACTOS on Wednesday, October 9.    Do not take  Insulin dose(Victoza) the night before your procedure. Do not take any insulin ( LANTUS) on the day of the procedure.   On the morning of your procedure, take your Aspirin 325 MG and any morning medicines NOT listed above.  You may use sips of water.  5. Plan for one night stay--bring personal belongings. 6. Bring a current list of your medications and current insurance cards. 7. You MUST have a responsible person to drive you home. 8. Someone MUST be with you the first 24 hours after you arrive home or your discharge will be delayed. 9. Please wear clothes that are easy to get on and off and wear slip-on shoes.  Thank you for allowing [place] to care for you!   -- [facility]    MAY USE  NITROGLYCERIN 0.4 MG SUBLINGUAL IF NEEDED   Your physician recommends that you schedule a follow-up appointment in 2 [place].    Nitroglycerin sublingual tablets What is this medicine? NITROGLYCERIN (nye troe GLI ser in) is a type of vasodilator. It relaxes blood vessels, increasing the blood and oxygen supply to your heart. This medicine is used to relieve chest pain caused by angina. It is also used to prevent chest pain before activities like climbing stairs, going outdoors in cold weather, or sexual activity. This medicine may be used for other purposes; ask your health care provider or pharmacist if you have questions. COMMON BRAND NAME(S): Nitroquick, Nitrostat, Nitrotab What should I tell my health care provider before I take this medicine? They need to know if you have any of these conditions: -anemia -head injury, recent stroke, or bleeding in the brain -liver disease -previous heart attack -an unusual or allergic reaction to nitroglycerin, other medicines, foods, dyes, or preservatives -pregnant or trying to get pregnant -breast-feeding How should I use this medicine? Take this medicine by mouth as needed. At the first sign of an angina attack (chest pain or tightness) place one tablet under your tongue. You can also take this medicine 5 to 10 minutes before an event likely to produce chest pain. Follow the directions on the prescription label. Let the tablet dissolve under the tongue. Do not swallow whole. Replace the dose if you accidentally swallow it. It will help if your mouth is not dry. Saliva around the tablet will help it to dissolve more quickly. Do not eat or drink, smoke or chew tobacco while a tablet is dissolving. If you are  not better within 5 minutes after taking ONE dose of nitroglycerin, call 9-1-1 immediately to seek emergency medical care. Do not take more than 3 nitroglycerin tablets over 15 minutes. If you take this medicine often to relieve symptoms of angina, your doctor or health care professional may provide you with different instructions to manage your symptoms. If symptoms  do not go away after following these instructions, it is important to call 9-1-1 immediately. Do not take more than 3 nitroglycerin tablets over 15 minutes. Talk to your pediatrician regarding the use of this medicine in children. Special care may be needed. Overdosage: If you think you have taken too much of this medicine contact a poison control center or emergency room at once. NOTE: This medicine is only for you. Do not share this medicine with others. What if I miss a dose? This does not apply. This medicine is only used as needed. What may interact with this medicine? Do not take this medicine with any of the following medications: -certain migraine medicines like ergotamine and dihydroergotamine (DHE) -medicines used to treat erectile dysfunction like sildenafil, tadalafil, and vardenafil -riociguat This medicine may also interact with the following medications: -alteplase -aspirin -heparin -medicines for high blood pressure -medicines for mental depression -other medicines used to treat angina -phenothiazines like chlorpromazine, mesoridazine, prochlorperazine, thioridazine This list may not describe all possible interactions. Give your health care provider a list of all the medicines, herbs, non-prescription drugs, or dietary supplements you use. Also tell them if you smoke, drink alcohol, or use illegal drugs. Some items may interact with your medicine. What should I watch for while using this medicine? Tell your doctor or health care professional if you feel your medicine is no longer working. Keep this medicine with you at all times. Sit or lie down when you take your medicine to prevent falling if you feel dizzy or faint after using it. Try to remain calm. This will help you to feel better faster. If you feel dizzy, take several deep breaths and lie down with your feet propped up, or bend forward with your head resting between your knees. You may get drowsy or dizzy. Do not drive,  use machinery, or do anything that needs mental alertness until you know how this drug affects you. Do not stand or sit up quickly, especially if you are an older patient. This reduces the risk of dizzy or fainting spells. Alcohol can make you more drowsy and dizzy. Avoid alcoholic drinks. Do not treat yourself for coughs, colds, or pain while you are taking this medicine without asking your doctor or health care professional for advice. Some ingredients may increase your blood pressure. What side effects may I notice from receiving this medicine? Side effects that you should report to your doctor or health care professional as soon as possible: -blurred vision -dry mouth -skin rash -sweating -the feeling of extreme pressure in the head -unusually weak or tired Side effects that usually do not require medical attention (report to your doctor or health care professional if they continue or are bothersome): -flushing of the face or neck -headache -irregular heartbeat, palpitations -nausea, vomiting This list may not describe all possible side effects. Call your doctor for medical advice about side effects. You may report side effects to FDA at 1-800-FDA-1088. Where should I keep my medicine? Keep out of the reach of children. Store at room temperature between 20 and 25 degrees C (68 and 77 degrees F). Store in [employer]. Protect from light  and moisture. Keep tightly closed. Throw away any unused medicine after the expiration date. NOTE: This sheet is a summary. It may not cover all possible information. If you have questions about this medicine, talk to your doctor, pharmacist, or health care provider.  2018 Elsevier/Gold Standard (2013-01-11 17:57:36)

## 2017-01-01 LAB — BASIC METABOLIC PANEL
BUN / CREAT RATIO: 30 — AB (ref 12–28)
BUN: 24 mg/dL (ref 8–27)
CALCIUM: 9.5 mg/dL (ref 8.7–10.3)
CO2: 20 mmol/L (ref 20–29)
CREATININE: 0.79 mg/dL (ref 0.57–1.00)
Chloride: 104 mmol/L (ref 96–106)
GFR calc non Af Amer: 75 mL/min/{1.73_m2} (ref >59)
GFR, EST AFRICAN AMERICAN: 86 mL/min/{1.73_m2} (ref >59)
Glucose: 96 mg/dL (ref 65–99)
Potassium: 4.4 mmol/L (ref 3.5–5.2)
Sodium: 142 mmol/L (ref 134–144)

## 2017-01-01 LAB — CBC
HEMOGLOBIN: 13 g/dL (ref 11.1–15.9)
Hematocrit: 39.4 % (ref 34.0–46.6)
MCH: 30.5 pg (ref 26.6–33.0)
MCHC: 33 g/dL (ref 31.5–35.7)
MCV: 93 fL (ref 79–97)
Platelets: 281 10*3/uL (ref 150–379)
RBC: 4.26 x10E6/uL (ref 3.77–5.28)
RDW: 14 % (ref 12.3–15.4)
WBC: 9.2 10*3/uL (ref 3.4–10.8)

## 2017-01-01 LAB — PROTIME-INR
INR: 1.1 (ref 0.8–1.2)
Prothrombin Time: 11.2 s (ref 9.1–12.0)

## 2017-01-02 ENCOUNTER — Encounter: Payer: Self-pay | Admitting: Cardiology

## 2017-01-02 NOTE — Assessment & Plan Note (Addendum)
71 year old woman with multiple cardiac risk factors (hypertension, hyperlipidemia, diabetes mellitus on insulin and obesity = metabolic syndrome) with 1 prolonged followed by a second less notable episode of substernal chest pressure radiating to the jaw associated with some dyspnea and other classic symptoms for angina at rest. She has not had any further episodes which is somewhat atypical, however these episodes were quite convincing.  I am concerned that she has a high likelihood for having coronary disease based on these symptoms and her risk factors. I'm also concerned that we may not get adequate evaluation from noninvasive testing: Likely poor images on CT as well as Myoview. Cannot guarantee good images on echocardiogram.  After discussing various options with the patient and voicing my opinion a preference, we decided to agree that the best course of action would be to proceed with diagnostic cardiac catheterization.  Plan: Schedule cardiac catheterization for next week.  start carvedilol 3.125 mg twice a day and provide sublingual nitroglycerin.   Continue daily aspirin  I also asked that she refrain from any significant activity until after the catheterization.  Hold Lantus AM of cath, but ok to take Victoza.  Hold Actos AM of cath.  CONSENT:  Performing MD:  Glenetta Hew, M.D., M.S.  Procedure:  LEFT HEART CATHETERIZATION WITH CORONARY ANGIOGRAPHY and possible PERCUTANEOUS CORONARY INTERVENTION.  The procedure with Risks/Benefits/Alternatives and Indications was reviewed with the patient and her sister.  All questions were answered.    Risks / Complications include, but not limited to: Death, MI, CVA/TIA, VF/VT (with defibrillation), Bradycardia (need for temporary pacer placement), contrast induced nephropathy, bleeding / bruising / hematoma / pseudoaneurysm, vascular or coronary injury (with possible emergent CT or Vascular Surgery), adverse medication reactions,  infection.  Additional risks involving the use of radiation with the possibility of radiation burns and cancer were explained in detail.  The patient (and SISTER) voice understanding and agree to proceed.

## 2017-01-02 NOTE — Assessment & Plan Note (Signed)
Her current blood pressure is low but high and she is on lisinopril and HCTZ. I will add carvedilol 3.125 mg twice a day mostly for cardiac protection.

## 2017-01-02 NOTE — Assessment & Plan Note (Signed)
Converted from atorvastatin to Livalo secondary to myalgias. She is also on fish oil.

## 2017-01-02 NOTE — Assessment & Plan Note (Signed)
She is on Lantus, Actos and Victoza  She will need to hold the Lantus and Actos, but not necessarily Victoza for her cath.

## 2017-01-03 ENCOUNTER — Other Ambulatory Visit: Payer: Self-pay | Admitting: *Deleted

## 2017-01-03 DIAGNOSIS — I200 Unstable angina: Secondary | ICD-10-CM

## 2017-01-03 DIAGNOSIS — Z01818 Encounter for other preprocedural examination: Secondary | ICD-10-CM

## 2017-01-04 ENCOUNTER — Telehealth: Payer: Self-pay

## 2017-01-04 NOTE — Telephone Encounter (Signed)
Patient contacted pre-catheterization at [facility] scheduled for:  01/05/2017 @ 0830 Verified arrival time and place:  [facility] @ 0630 Confirmed AM meds to be taken pre-cath with sip of water: Take ASA Hold lisi/hctz, actos, lantus Night before-hold victoza Confirmed patient has responsible person to drive home post procedure and observe patient for 24 hours:  yes Addl concerns:  none

## 2017-01-05 ENCOUNTER — Encounter (HOSPITAL_COMMUNITY): Payer: Self-pay | Admitting: Cardiology

## 2017-01-05 ENCOUNTER — Ambulatory Visit (HOSPITAL_COMMUNITY)
Admission: RE | Disposition: A | Discharge status: Home / Self Care | Payer: Self-pay | Source: Ambulatory Visit | Attending: Cardiology

## 2017-01-05 ENCOUNTER — Ambulatory Visit (HOSPITAL_COMMUNITY)
Admission: RE | Admit: 2017-01-05 | Discharge: 2017-01-05 | Disposition: A | Discharge status: Home / Self Care | Payer: Medicare Other | Source: Ambulatory Visit | Attending: Cardiology | Admitting: Cardiology

## 2017-01-05 DIAGNOSIS — Z8052 Family history of malignant neoplasm of bladder: Secondary | ICD-10-CM | POA: Diagnosis not present

## 2017-01-05 DIAGNOSIS — Z01818 Encounter for other preprocedural examination: Secondary | ICD-10-CM

## 2017-01-05 DIAGNOSIS — I10 Essential (primary) hypertension: Secondary | ICD-10-CM | POA: Diagnosis present

## 2017-01-05 DIAGNOSIS — E118 Type 2 diabetes mellitus with unspecified complications: Secondary | ICD-10-CM | POA: Diagnosis not present

## 2017-01-05 DIAGNOSIS — Z7982 Long term (current) use of aspirin: Secondary | ICD-10-CM | POA: Diagnosis not present

## 2017-01-05 DIAGNOSIS — Z833 Family history of diabetes mellitus: Secondary | ICD-10-CM | POA: Insufficient documentation

## 2017-01-05 DIAGNOSIS — E8881 Metabolic syndrome: Secondary | ICD-10-CM | POA: Insufficient documentation

## 2017-01-05 DIAGNOSIS — R079 Chest pain, unspecified: Secondary | ICD-10-CM | POA: Diagnosis present

## 2017-01-05 DIAGNOSIS — I200 Unstable angina: Secondary | ICD-10-CM | POA: Insufficient documentation

## 2017-01-05 DIAGNOSIS — E785 Hyperlipidemia, unspecified: Secondary | ICD-10-CM | POA: Diagnosis not present

## 2017-01-05 DIAGNOSIS — Z882 Allergy status to sulfonamides status: Secondary | ICD-10-CM | POA: Diagnosis not present

## 2017-01-05 DIAGNOSIS — Z8249 Family history of ischemic heart disease and other diseases of the circulatory system: Secondary | ICD-10-CM | POA: Diagnosis not present

## 2017-01-05 DIAGNOSIS — Z818 Family history of other mental and behavioral disorders: Secondary | ICD-10-CM | POA: Insufficient documentation

## 2017-01-05 DIAGNOSIS — Z823 Family history of stroke: Secondary | ICD-10-CM | POA: Diagnosis not present

## 2017-01-05 DIAGNOSIS — Z794 Long term (current) use of insulin: Secondary | ICD-10-CM | POA: Diagnosis not present

## 2017-01-05 DIAGNOSIS — Z79899 Other long term (current) drug therapy: Secondary | ICD-10-CM | POA: Diagnosis not present

## 2017-01-05 DIAGNOSIS — Z800 Family history of malignant neoplasm of digestive organs: Secondary | ICD-10-CM | POA: Diagnosis not present

## 2017-01-05 DIAGNOSIS — F419 Anxiety disorder, unspecified: Secondary | ICD-10-CM | POA: Diagnosis not present

## 2017-01-05 DIAGNOSIS — Z6841 Body Mass Index (BMI) 40.0 and over, adult: Secondary | ICD-10-CM | POA: Diagnosis not present

## 2017-01-05 DIAGNOSIS — K219 Gastro-esophageal reflux disease without esophagitis: Secondary | ICD-10-CM | POA: Diagnosis not present

## 2017-01-05 DIAGNOSIS — R6884 Jaw pain: Secondary | ICD-10-CM | POA: Diagnosis not present

## 2017-01-05 HISTORY — PX: LEFT HEART CATH AND CORONARY ANGIOGRAPHY: CATH118249

## 2017-01-05 LAB — GLUCOSE, CAPILLARY
GLUCOSE-CAPILLARY: 119 mg/dL — AB (ref 65–99)
GLUCOSE-CAPILLARY: 116 mg/dL — AB (ref 65–99)

## 2017-01-05 SURGERY — LEFT HEART CATH AND CORONARY ANGIOGRAPHY
Anesthesia: LOCAL

## 2017-01-05 MED ORDER — HEPARIN SODIUM (PORCINE) 1000 UNIT/ML IJ SOLN
INTRAMUSCULAR | Status: AC
  Filled 2017-01-05: qty 1

## 2017-01-05 MED ORDER — VERAPAMIL HCL 2.5 MG/ML IV SOLN
INTRAVENOUS | Status: AC
  Filled 2017-01-05: qty 2

## 2017-01-05 MED ORDER — VERAPAMIL HCL 2.5 MG/ML IV SOLN
INTRAVENOUS | Status: DC | PRN
  Administered 2017-01-05: 10 mL via INTRA_ARTERIAL

## 2017-01-05 MED ORDER — HEPARIN (PORCINE) IN NACL 2-0.9 UNIT/ML-% IJ SOLN
INTRAMUSCULAR | Status: AC
  Filled 2017-01-05: qty 1000

## 2017-01-05 MED ORDER — IOPAMIDOL (ISOVUE-370) INJECTION 76%
INTRAVENOUS | Status: DC | PRN
  Administered 2017-01-05: 60 mL

## 2017-01-05 MED ORDER — SODIUM CHLORIDE 0.9% FLUSH: 3.0000 mL | INTRAVENOUS | Status: DC | PRN

## 2017-01-05 MED ORDER — IOPAMIDOL (ISOVUE-370) INJECTION 76%
INTRAVENOUS | Status: AC
  Filled 2017-01-05: qty 100

## 2017-01-05 MED ORDER — SODIUM CHLORIDE 0.9 % IV SOLN
INTRAVENOUS | Status: DC
  Administered 2017-01-05: 08:00:00 via INTRAVENOUS

## 2017-01-05 MED ORDER — LIDOCAINE HCL 2 % IJ SOLN
INTRAMUSCULAR | Status: AC
  Filled 2017-01-05: qty 10

## 2017-01-05 MED ORDER — MIDAZOLAM HCL 2 MG/2ML IJ SOLN
INTRAMUSCULAR | Status: AC
  Filled 2017-01-05: qty 2

## 2017-01-05 MED ORDER — ACETAMINOPHEN 325 MG PO TABS: 650.0000 mg | ORAL_TABLET | ORAL | Status: DC | PRN

## 2017-01-05 MED ORDER — SODIUM CHLORIDE 0.9 % IV SOLN: INTRAVENOUS | Status: AC

## 2017-01-05 MED ORDER — HEPARIN SODIUM (PORCINE) 1000 UNIT/ML IJ SOLN
INTRAMUSCULAR | Status: DC | PRN
  Administered 2017-01-05: 6000 [IU] via INTRAVENOUS

## 2017-01-05 MED ORDER — HEPARIN (PORCINE) IN NACL 2-0.9 UNIT/ML-% IJ SOLN
INTRAMUSCULAR | Status: AC | PRN
  Administered 2017-01-05: 1000 mL

## 2017-01-05 MED ORDER — LIDOCAINE HCL (PF) 1 % IJ SOLN
INTRAMUSCULAR | Status: DC | PRN
  Administered 2017-01-05: 3 mL

## 2017-01-05 MED ORDER — SODIUM CHLORIDE 0.9% FLUSH: 3.0000 mL | Freq: Two times a day (BID) | INTRAVENOUS | Status: DC

## 2017-01-05 MED ORDER — MIDAZOLAM HCL 2 MG/2ML IJ SOLN
INTRAMUSCULAR | Status: DC | PRN
  Administered 2017-01-05: 2 mg via INTRAVENOUS

## 2017-01-05 MED ORDER — FENTANYL CITRATE (PF) 100 MCG/2ML IJ SOLN
INTRAMUSCULAR | Status: AC
  Filled 2017-01-05: qty 2

## 2017-01-05 MED ORDER — SODIUM CHLORIDE 0.9 % IV SOLN: 250.0000 mL | INTRAVENOUS | Status: DC | PRN

## 2017-01-05 MED ORDER — ONDANSETRON HCL 4 MG/2ML IJ SOLN: 4.0000 mg | Freq: Four times a day (QID) | INTRAMUSCULAR | Status: DC | PRN

## 2017-01-05 MED ORDER — FENTANYL CITRATE (PF) 100 MCG/2ML IJ SOLN
INTRAMUSCULAR | Status: DC | PRN
  Administered 2017-01-05: 25 ug via INTRAVENOUS

## 2017-01-05 SURGICAL SUPPLY — 9 items
CATH IMPULSE 5F ANG/FL3.5 (CATHETERS) ×1 IMPLANT
DEVICE RAD COMP TR BAND LRG (VASCULAR PRODUCTS) ×1 IMPLANT
GLIDESHEATH SLEND SS 6F .021 (SHEATH) ×1 IMPLANT
GUIDEWIRE INQWIRE 1.5J.035X260 (WIRE) IMPLANT
INQWIRE 1.5J .035X260CM (WIRE) ×2
KIT HEART LEFT (KITS) ×2 IMPLANT
PACK CARDIAC CATHETERIZATION (CUSTOM PROCEDURE TRAY) ×2 IMPLANT
TRANSDUCER W/STOPCOCK (MISCELLANEOUS) ×2 IMPLANT
TUBING CIL FLEX 10 FLL-RA (TUBING) ×2 IMPLANT

## 2017-01-05 NOTE — H&P (View-Only) (Signed)
PCP: Kerry Melter, MD  Clinic Note: Chief Complaint  Patient presents with  . New Patient (Initial Visit)    "Unstable Angina"  . Chest Pain    Pressure with right jaw pain.    HPI: [name] is a 71 y.o. female who is being seen today for the evaluation of "Unstable angina " at the request of [name], [place]. [name] is a morbidly obese woman with cardiac risk factors of insulin-dependent type 2 diabetes, hypertension, hyperlipidemia. She has a family history notable for her mother having a stroke and one of her 3 brothers having coronary disease as well as PAD. All of her brothers (3) and one sister have at least diabetes if not accommodation of diabetes, hypertension and hyperlipidemia.  [name] was last seen on October 3 by [name] to evaluate jaw pain and chest heaviness. She was urgently referred for cardiology consultation.  Recent Hospitalizations: None  Studies Personally Reviewed - (if available, images/films reviewed: From Epic Chart or Care Everywhere)  None  Interval History: [name] presents here today stating that about a week ago she had a prolonged episode (5-10 minutes) of a heavy pressure sensation in the center of her chest that started off actually with pain in her right jaw. She initially thought that maybe this was a symptom of her blood sugars being reduced because she started feeling sweaty shaky and clammy. The significant discomfort went away after about 5 minutes, but her general feeling of the knees and jitteriness lasted about 2 hours. (He did not mention to me, but she did mention to [name] that her chest heaviness and jaw pain then recurred later on that afternoon. At that time both her blood pressure heart rate and blood sugars were fine. This episode did not last as long and was not associated with nearly as much of the sweating and shakiness. The first episode occurred while she was crocheting a quilt. The  second episode was also at rest She did not notice significant dyspnea associated with these episodes, because she was really more focusing on her uneasiness. Her sister however did indicate that the one episode she saw (the longer lasting one) was associated with what looked like somewhat labored breathing. The second episode was not as prominent.  She has not had any further episodes, but has really been scared to do much of anything since. We didn't do much of any type of activity while she was feeling the symptoms therefore she is not able to tell me if they were exacerbated by exertion or not.  She has noted some shortness of breath lying down flat but has not had any PND symptoms. She has mild stable lower extremity swelling that goes down at night.   She notes occasional chest fluttering symptoms, but has not really noted any rapid irregular heartbeats or palpitations.  No syncope/near syncope. No TIA/amaurosis fugax symptoms. No melena, hematochezia, hematuria, or epstaxis. No claudication.  She notes that she was starting to have leg myalgias on atorvastatin and has recently switched to Livalo.  ROS: A comprehensive was performed. Review of Systems  Constitutional: Negative for malaise/fatigue and weight loss.  HENT: Negative for congestion.   Respiratory: Negative for cough and shortness of breath.   Cardiovascular:       Per history of present illness  Gastrointestinal: Negative for heartburn, nausea (Although she did feel somewhat nauseated when she was having the initial episode noted in history of present illness.) and vomiting.  Musculoskeletal: Negative  for joint pain and myalgias.  Neurological: Negative for dizziness and focal weakness.  Psychiatric/Behavioral: Negative for memory loss. The patient is nervous/anxious. The patient does not have insomnia.   All other systems reviewed and are negative.  I have reviewed and (if needed) personally updated the patient's  problem list, medications, allergies, past medical and surgical history, social and family history.   Past Medical History:  Diagnosis Date  . Actinic keratosis    Followed by [name]  . Anxiety   . Diabetes mellitus, type II, insulin dependent ([place])    Associated with obesity: She takes Lantus, Victoza & Actos  . Essential hypertension   . GERD (gastroesophageal reflux disease)   . Hyperlipidemia   . Morbid obesity with BMI of 40.0-44.9, adult [facility])     Past Surgical History:  Procedure Laterality Date  . BREAST BIOPSY  1997    Current Meds  Medication Sig  . aspirin 325 MG tablet Take 325 mg by mouth daily as needed (chest pains).   Marland Kitchen LANTUS SOLOSTAR 100 UNIT/ML Solostar Pen Inject 50 Units into the skin daily. Before breakfast  . lisinopril-hydrochlorothiazide (PRINZIDE,ZESTORETIC) 20-12.5 MG tablet Take 1 tablet by mouth daily.  . pioglitazone (ACTOS) 30 MG tablet Take 30 mg by mouth every evening.   . [DISCONTINUED] Cholecalciferol (VITAMIN D PO) Take 1 tablet by mouth daily.  . [DISCONTINUED] Liraglutide (VICTOZA [place]) Inject 1.2 mg into the skin daily.  . [DISCONTINUED] Omega-3 Fatty Acids (FISH OIL PO) Take 1 tablet by mouth daily.  . [DISCONTINUED] Pitavastatin Calcium (LIVALO PO) Take 1 tablet by mouth daily.  . [DISCONTINUED] VITAMIN E PO Take 1 tablet by mouth daily.    Allergies  Allergen Reactions  . Sulfa Antibiotics Other (See Comments)    Itching and red spots.    Social History   Social History  . Marital status: Widowed    Spouse name: N/A  . Number of children: 3  . Years of education: 12   Occupational History  .      Retired   Social History Main Topics  . Smoking status: Never Smoker  . Smokeless tobacco: Never Used  . Alcohol use No  . Drug use: No  . Sexual activity: Not Currently   Other Topics Concern  . None   Social History Narrative   She is a widowed mother of 3 with 5 grandchildren. She lives with one of her daughters and  grandson. She is a coming by her sister. She tries to walk, but does not do it regularly.   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    family history includes Bladder Cancer in her brother; Coronary artery disease in her brother; Depression in her sister; Diabetes in her brother, brother, mother, and sister; Hyperlipidemia in her mother and sister; Hypertension in her sister; Kidney disease in her brother; Liver cancer in her father; Peripheral Artery Disease in her brother; Stroke (age of onset: 26) in her brother; Stroke (age of onset: 96) in her mother.  Wt Readings from Last 3 Encounters:  12/31/16 245 lb (111.1 kg)    PHYSICAL EXAM BP (!) 146/70   Pulse 76   Ht 5\' 2"  (1.575 m)   Wt 245 lb (111.1 kg)   BMI 44.81 kg/m  Physical Exam  Constitutional: She is oriented to person, place, and time. She appears well-developed and well-nourished. No distress.  Morbidly obese. Well groomed  HENT:  Head: Normocephalic and atraumatic.  Mouth/Throat: Oropharynx is  clear and moist. No oropharyngeal exudate.  Mallampati score = 2  Eyes: Pupils are equal, round, and reactive to light. Conjunctivae and EOM are normal. No scleral icterus.  Neck: Normal range of motion. Neck supple. No JVD (Unable to assess due to body habitus) present. Carotid bruit is not present.  Cardiovascular: Normal rate, regular rhythm, S1 normal, S2 normal, intact distal pulses and normal pulses.   No extrasystoles are present. PMI is not displaced (Unable to palpate).  Exam reveals distant heart sounds. Exam reveals no gallop.   No murmur heard. Pulmonary/Chest: Effort normal. No respiratory distress. She has no wheezes. She has no rales.  Distant breath sounds, but clear  Abdominal: Soft. Bowel sounds are normal. She exhibits no distension. There is no tenderness. There is no rebound.  Morbidly obese  Musculoskeletal: Normal range of motion. She exhibits no edema or deformity.  Neurological: She is alert  and oriented to person, place, and time. No cranial nerve deficit. She exhibits normal muscle tone. Coordination normal.  Skin: Skin is warm and dry. No rash noted. No erythema.  Psychiatric: She has a normal mood and affect. Her behavior is normal. Judgment and thought content normal.  Somewhat anxious  Nursing note and vitals reviewed.    Adult ECG Report Interpretation of EKG from PCP 12/29/2016: Rate: 69 ;  Rhythm: normal sinus rhythm and borderline low voltage in precordial leads (likely related to have his). As such there are nonspecific ST and T-wave changes.;   Narrative Interpretation: Essentially normal EKG.  Today: Rate: 76 ;  Rhythm: normal sinus rhythm and borderline voltage for LVH in limb leads. Nonspecific, but otherwise no significant ST and T-wave changes.;   Narrative Interpretation: Relatively normal EKG   Other studies Reviewed: Additional studies/ records that were reviewed today include:  Recent Labs:  None available   ASSESSMENT / PLAN: Problem List Items Addressed This Visit    DM (diabetes mellitus), type 2 with complications ([place]) (Chronic)    She is on Lantus, Actos and Victoza  She will need to hold the Lantus and Actos, but not necessarily Victoza for her cath.      Relevant Medications   lisinopril-hydrochlorothiazide (PRINZIDE,ZESTORETIC) 20-12.5 MG tablet   pioglitazone (ACTOS) 30 MG tablet   LANTUS SOLOSTAR 100 UNIT/ML Solostar Pen   aspirin 325 MG tablet   Other Relevant Orders   EKG 12-Lead   LEFT HEART CATHETERIZATION WITH CORONARY ANGIOGRAM   Essential hypertension (Chronic)    Her current blood pressure is low but high and she is on lisinopril and HCTZ. I will add carvedilol 3.125 mg twice a day mostly for cardiac protection.      Relevant Medications   lisinopril-hydrochlorothiazide (PRINZIDE,ZESTORETIC) 20-12.5 MG tablet   aspirin 325 MG tablet   nitroGLYCERIN (NITROSTAT) 0.4 MG SL tablet   carvedilol (COREG) 3.125 MG tablet    Hyperlipidemia with target low density lipoprotein (LDL) cholesterol less than 70 mg/dL (Chronic)    Converted from atorvastatin to Livalo secondary to myalgias. She is also on fish oil.      Relevant Medications   lisinopril-hydrochlorothiazide (PRINZIDE,ZESTORETIC) 20-12.5 MG tablet   aspirin 325 MG tablet   nitroGLYCERIN (NITROSTAT) 0.4 MG SL tablet   carvedilol (COREG) 3.125 MG tablet   Other Relevant Orders   LEFT HEART CATHETERIZATION WITH CORONARY ANGIOGRAM   Unstable angina (HCC) - Primary    71 year old woman with multiple cardiac risk factors (hypertension, hyperlipidemia, diabetes mellitus on insulin and obesity = metabolic syndrome) with 1 prolonged followed  by a second less notable episode of substernal chest pressure radiating to the jaw associated with some dyspnea and other classic symptoms for angina at rest. She has not had any further episodes which is somewhat atypical, however these episodes were quite convincing.  I am concerned that she has a high likelihood for having coronary disease based on these symptoms and her risk factors. I'm also concerned that we may not get adequate evaluation from noninvasive testing: Likely poor images on CT as well as Myoview. Cannot guarantee good images on echocardiogram.  After discussing various options with the patient and voicing my opinion a preference, we decided to agree that the best course of action would be to proceed with diagnostic cardiac catheterization.  Plan: Schedule cardiac catheterization for next week.  start carvedilol 3.125 mg twice a day and provide sublingual nitroglycerin.   Continue daily aspirin  I also asked that she refrain from any significant activity until after the catheterization.  Hold Lantus AM of cath, but ok to take Victoza.  Hold Actos AM of cath.  CONSENT:  Performing MD:  Glenetta Hew, M.D., M.S.  Procedure:  LEFT HEART CATHETERIZATION WITH CORONARY ANGIOGRAPHY and possible  PERCUTANEOUS CORONARY INTERVENTION.  The procedure with Risks/Benefits/Alternatives and Indications was reviewed with the patient and her sister.  All questions were answered.    Risks / Complications include, but not limited to: Death, MI, CVA/TIA, VF/VT (with defibrillation), Bradycardia (need for temporary pacer placement), contrast induced nephropathy, bleeding / bruising / hematoma / pseudoaneurysm, vascular or coronary injury (with possible emergent CT or Vascular Surgery), adverse medication reactions, infection.  Additional risks involving the use of radiation with the possibility of radiation burns and cancer were explained in detail.  The patient (and SISTER) voice understanding and agree to proceed.         Relevant Medications   lisinopril-hydrochlorothiazide (PRINZIDE,ZESTORETIC) 20-12.5 MG tablet   aspirin 325 MG tablet   nitroGLYCERIN (NITROSTAT) 0.4 MG SL tablet   carvedilol (COREG) 3.125 MG tablet   Other Relevant Orders   EKG 12-Lead   LEFT HEART CATHETERIZATION WITH CORONARY ANGIOGRAM   Basic metabolic panel (Completed)   Protime-INR (Completed)   CBC (Completed)    Other Visit Diagnoses    Pre-op testing       Relevant Orders   Basic metabolic panel (Completed)   Protime-INR (Completed)   CBC (Completed)   Clotting disorder ([place])       Relevant Orders   Protime-INR (Completed)      Current medicines are reviewed at length with the patient today. (+/- concerns) none The following changes have been made: Add Coreg & PRN NTG.   Patient Instructions    [facility] [address] [place] [address] Dept: [phone] Loc: [phone]  [id number]  12/31/2016  You are scheduled for a Cardiac Catheterization on Wednesday, October 10 with Dr. Glenetta Hew.  1. Please arrive at the [facility] (Main Entrance A) at [facility]: [address]  [place], [place] [address] at 6:30 AM (two hours before your procedure to ensure your preparation). Free valet parking service is available.   Special note: Every effort is made to have your procedure done on time. Please understand that emergencies sometimes delay scheduled procedures.  2. Diet: Do not eat or drink anything after midnight prior to your procedure except sips of water to take medications.  3. Labs: DO LABS TODAY -- BMP , CBC ,  PT/INR  4. Medication instructions in preparation for your procedure:  START TAKING CARVEDILOL 3.125 MG TWICE A DAY STARTING 12/31/16 Stop taking, ACTOS on Wednesday, October 9.    Do not take  Insulin dose(Victoza) the night before your procedure. Do not take any insulin ( LANTUS) on the day of the procedure.   On the morning of your procedure, take your Aspirin 325 MG and any morning medicines NOT listed above.  You may use sips of water.  5. Plan for one night stay--bring personal belongings. 6. Bring a current list of your medications and current insurance cards. 7. You MUST have a responsible person to drive you home. 8. Someone MUST be with you the first 24 hours after you arrive home or your discharge will be delayed. 9. Please wear clothes that are easy to get on and off and wear slip-on shoes.  Thank you for allowing [place] to care for you!   -- [facility]    MAY USE  NITROGLYCERIN 0.4 MG SUBLINGUAL IF NEEDED   Your physician recommends that you schedule a follow-up appointment in 2 [place].    Nitroglycerin sublingual tablets What is this medicine? NITROGLYCERIN (nye troe GLI ser in) is a type of vasodilator. It relaxes blood vessels, increasing the blood and oxygen supply to your heart. This medicine is used to relieve chest pain caused by angina. It is also used to prevent chest pain before activities like climbing stairs, going outdoors in cold weather, or sexual activity. This medicine may be  used for other purposes; ask your health care provider or pharmacist if you have questions. COMMON BRAND NAME(S): Nitroquick, Nitrostat, Nitrotab What should I tell my health care provider before I take this medicine? They need to know if you have any of these conditions: -anemia -head injury, recent stroke, or bleeding in the brain -liver disease -previous heart attack -an unusual or allergic reaction to nitroglycerin, other medicines, foods, dyes, or preservatives -pregnant or trying to get pregnant -breast-feeding How should I use this medicine? Take this medicine by mouth as needed. At the first sign of an angina attack (chest pain or tightness) place one tablet under your tongue. You can also take this medicine 5 to 10 minutes before an event likely to produce chest pain. Follow the directions on the prescription label. Let the tablet dissolve under the tongue. Do not swallow whole. Replace the dose if you accidentally swallow it. It will help if your mouth is not dry. Saliva around the tablet will help it to dissolve more quickly. Do not eat or drink, smoke or chew tobacco while a tablet is dissolving. If you are not better within 5 minutes after taking ONE dose of nitroglycerin, call 9-1-1 immediately to seek emergency medical care. Do not take more than 3 nitroglycerin tablets over 15 minutes. If you take this medicine often to relieve symptoms of angina, your doctor or health care professional may provide you with different instructions to manage your symptoms. If symptoms do not go away after following these instructions, it is important to call 9-1-1 immediately. Do not take more than 3 nitroglycerin tablets over 15 minutes. Talk to your pediatrician regarding the use of this medicine in children. Special care may be needed. Overdosage: If you think you have taken too much of this medicine contact a poison control center or emergency room at once. NOTE: This medicine is only for you. Do  not share this medicine with others. What  if I miss a dose? This does not apply. This medicine is only used as needed. What may interact with this medicine? Do not take this medicine with any of the following medications: -certain migraine medicines like ergotamine and dihydroergotamine (DHE) -medicines used to treat erectile dysfunction like sildenafil, tadalafil, and vardenafil -riociguat This medicine may also interact with the following medications: -alteplase -aspirin -heparin -medicines for high blood pressure -medicines for mental depression -other medicines used to treat angina -phenothiazines like chlorpromazine, mesoridazine, prochlorperazine, thioridazine This list may not describe all possible interactions. Give your health care provider a list of all the medicines, herbs, non-prescription drugs, or dietary supplements you use. Also tell them if you smoke, drink alcohol, or use illegal drugs. Some items may interact with your medicine. What should I watch for while using this medicine? Tell your doctor or health care professional if you feel your medicine is no longer working. Keep this medicine with you at all times. Sit or lie down when you take your medicine to prevent falling if you feel dizzy or faint after using it. Try to remain calm. This will help you to feel better faster. If you feel dizzy, take several deep breaths and lie down with your feet propped up, or bend forward with your head resting between your knees. You may get drowsy or dizzy. Do not drive, use machinery, or do anything that needs mental alertness until you know how this drug affects you. Do not stand or sit up quickly, especially if you are an older patient. This reduces the risk of dizzy or fainting spells. Alcohol can make you more drowsy and dizzy. Avoid alcoholic drinks. Do not treat yourself for coughs, colds, or pain while you are taking this medicine without asking your doctor or health care  professional for advice. Some ingredients may increase your blood pressure. What side effects may I notice from receiving this medicine? Side effects that you should report to your doctor or health care professional as soon as possible: -blurred vision -dry mouth -skin rash -sweating -the feeling of extreme pressure in the head -unusually weak or tired Side effects that usually do not require medical attention (report to your doctor or health care professional if they continue or are bothersome): -flushing of the face or neck -headache -irregular heartbeat, palpitations -nausea, vomiting This list may not describe all possible side effects. Call your doctor for medical advice about side effects. You may report side effects to FDA at 1-800-FDA-1088. Where should I keep my medicine? Keep out of the reach of children. Store at room temperature between 20 and 25 degrees C (68 and 77 degrees F). Store in [facility]. Protect from light and moisture. Keep tightly closed. Throw away any unused medicine after the expiration date. NOTE: This sheet is a summary. It may not cover all possible information. If you have questions about this medicine, talk to your doctor, pharmacist, or health care provider.  2018 Elsevier/Gold Standard (2013-01-11 17:57:36)    Studies Ordered:   Orders Placed This Encounter  Procedures  . Basic metabolic panel  . Protime-INR  . CBC  . EKG 12-Lead  . LEFT HEART CATHETERIZATION WITH CORONARY Illene Silver, M.D., M.S. Interventional Cardiologist   Pager # [phone] Phone # [phone] [address] [place], [place] [address]

## 2017-01-05 NOTE — Interval H&P Note (Signed)
History and Physical Interval Note:  01/05/2017 8:26 AM  [name]  has presented today for surgery, with the diagnosis of unstable angina.  The various methods of treatment have been discussed with the patient and family. After consideration of risks, benefits and other options for treatment, the patient has consented to  Procedure(s): LEFT HEART CATH AND CORONARY ANGIOGRAPHY (N/A) with possible PERCUTANEOUS CORONARY INTERVENTION as a surgical intervention .  The patient's history has been reviewed, patient examined, no change in status, stable for surgery.  I have reviewed the patient's chart and labs.  Questions were answered to the patient's satisfaction.     Cath Lab Visit (complete for each Cath Lab visit)  Clinical Evaluation Leading to the Procedure:   ACS: No.  Non-ACS:  MORE C/W PROGRESSIVE ANGINA  Anginal Classification: CCS IV  Anti-ischemic medical therapy: Minimal Therapy (1 class of medications)  Non-Invasive Test Results: No non-invasive testing performed  Prior CABG: No previous CABG   Glenetta Hew

## 2017-01-05 NOTE — Discharge Instructions (Signed)

## 2017-01-06 MED FILL — Lidocaine HCl Local Inj 2%: INTRAMUSCULAR | Qty: 10 | Status: AC

## 2017-01-12 DIAGNOSIS — E1142 Type 2 diabetes mellitus with diabetic polyneuropathy: Secondary | ICD-10-CM | POA: Diagnosis not present

## 2017-01-12 DIAGNOSIS — Z23 Encounter for immunization: Secondary | ICD-10-CM | POA: Diagnosis not present

## 2017-01-12 DIAGNOSIS — Z794 Long term (current) use of insulin: Secondary | ICD-10-CM | POA: Diagnosis not present

## 2017-01-12 DIAGNOSIS — E782 Mixed hyperlipidemia: Secondary | ICD-10-CM | POA: Diagnosis not present

## 2017-01-12 DIAGNOSIS — Z6841 Body Mass Index (BMI) 40.0 and over, adult: Secondary | ICD-10-CM | POA: Diagnosis not present

## 2017-01-12 DIAGNOSIS — F411 Generalized anxiety disorder: Secondary | ICD-10-CM | POA: Diagnosis not present

## 2017-01-12 DIAGNOSIS — I10 Essential (primary) hypertension: Secondary | ICD-10-CM | POA: Diagnosis not present

## 2017-01-24 ENCOUNTER — Ambulatory Visit (INDEPENDENT_AMBULATORY_CARE_PROVIDER_SITE_OTHER): Payer: Medicare Other | Admitting: Cardiology

## 2017-01-24 DIAGNOSIS — R079 Chest pain, unspecified: Secondary | ICD-10-CM

## 2017-01-24 DIAGNOSIS — I10 Essential (primary) hypertension: Secondary | ICD-10-CM

## 2017-01-24 DIAGNOSIS — I200 Unstable angina: Secondary | ICD-10-CM | POA: Diagnosis not present

## 2017-01-24 DIAGNOSIS — E785 Hyperlipidemia, unspecified: Secondary | ICD-10-CM

## 2017-01-24 NOTE — Patient Instructions (Addendum)
NO CHANGES WITH CURRENT MEDICATIONS  YOU MAY HOLD A DOSE OF CARVEDILOL IF YOU SOME DIZZINESS.   Your physician wants you to follow-up in [place]. IF YOU FEEL WELL,YOU MAY CANCEL APPOINTMENT. You will receive a reminder letter in the mail two months in advance. If you don't receive a letter, please call our office to schedule the follow-up appointment.

## 2017-01-24 NOTE — Progress Notes (Signed)
PCP: Orpah Melter, MD  Clinic Note: Chief Complaint  Patient presents with  . Follow-up    HPI: [name] is a 71 y.o. female with a PMH below who presents today for Post-cath follow-up.  [name] was initially seen on October 5 for the evaluation of chest pain concerning for unstable angina at the request of Orpah Melter, MD.  She had 2 concerning episodes for possible unstable angina and has had exertional dyspnea.  Therefore based on her presenting symptoms, we felt that the best course of action will be to proceed with cardiac catheterization that was done on October 10.  Recent Hospitalizations: Cardiac catheterization  Studies Personally Reviewed - (if available, images/films reviewed: From Epic Chart or Care Everywhere) LEFT HEART CATH AND CORONARY ANGIOGRAPHY: 01/05/2017  Angiographically normal coronary arteries with a right dominant system.  The left ventricular systolic function is normal.  LV end diastolic pressure is normal.  The left ventricular ejection fraction is 55-65% by visual estimate.  There is no mitral valve regurgitation.  There is no aortic valve stenosis. Angiographically normal coronary arteries premature excluding unstable angina.  Cannot exclude coronary spasm however.   Interval History: [name] returns today overall feeling quite well.  She has not had any further episodes of chest tightness or pressure with rest or exertion.  She is getting back to her baseline activity and denies any significant symptoms. No heart failure symptoms of PND, orthopnea or edema.  She actually feels better having started her beta-blocker. She still has occasional palpitations, but no rapid irregular heartbeats associated with lightheadedness, dizziness or syncope/near syncope.  No TIA or amaurosis fugax symptoms.  No claudication.  ROS: A comprehensive was performed. Review of Systems  Constitutional: Negative for malaise/fatigue.  HENT:  Negative for congestion and nosebleeds.   Respiratory: Negative for cough, shortness of breath and wheezing.   Cardiovascular:       Per HPI  Gastrointestinal: Negative for blood in stool, constipation and melena.  Genitourinary: Negative for hematuria.  Musculoskeletal: Negative for myalgias.  Neurological: Negative for dizziness and weakness.    I have reviewed and (if needed) personally updated the patient's problem list, medications, allergies, past medical and surgical history, social and family history.   Past Medical History:  Diagnosis Date  . Actinic keratosis    Followed by Dr. Allyson Sabal  . Anxiety   . Diabetes mellitus, type II, insulin dependent ([place])    Associated with obesity: She takes Lantus, Victoza & Actos  . Essential hypertension   . GERD (gastroesophageal reflux disease)   . Hyperlipidemia   . Morbid obesity with BMI of 40.0-44.9, adult [facility])     Past Surgical History:  Procedure Laterality Date  . BREAST BIOPSY  1997  . LEFT HEART CATH AND CORONARY ANGIOGRAPHY N/A 01/05/2017   Procedure: LEFT HEART CATH AND CORONARY ANGIOGRAPHY;  Surgeon: Leonie Man, MD;  Location: [place] [facility];  Service: Cardiovascular: Angiographically normal coronary arteries with a right dominant system.  Normal EF 55-65%.    Current Meds  Medication Sig  . aspirin 325 MG tablet Take 325 mg by mouth daily as needed (chest pains).   Marland Kitchen aspirin EC 81 MG tablet Take 81 mg by mouth daily.  . Calcium Carb-Cholecalciferol (HM CALCIUM 600 & VITAMIN D3) 600-800 MG-UNIT TABS Take 1 tablet by mouth 2 (two) times daily.  . carvedilol (COREG) 3.125 MG tablet Take 1 tablet (3.125 mg total) by mouth 2 (two) times daily.  . cholecalciferol (VITAMIN  D) 1000 units tablet Take 1,000 Units by mouth daily.  . Cranberry-Vitamin C (AZO CRANBERRY URINARY TRACT PO) Take 1 tablet by mouth 2 (two) times daily.  Marland Kitchen LANTUS SOLOSTAR 100 UNIT/ML Solostar Pen Inject 50 Units into the skin daily. Before  breakfast  . liraglutide (VICTOZA) 18 MG/3ML SOPN Inject 1.2 mg into the skin every evening.  Marland Kitchen lisinopril-hydrochlorothiazide (PRINZIDE,ZESTORETIC) 20-12.5 MG tablet Take 1 tablet by mouth daily.  . Naproxen Sod-Diphenhydramine (ALEVE PM) 220-25 MG TABS Take 2 tablets by mouth at bedtime as needed (sleep).  . nitroGLYCERIN (NITROSTAT) 0.4 MG SL tablet Place 1 tablet (0.4 mg total) under the tongue every 5 (five) minutes as needed for chest pain.  . Omega-3 Fatty Acids (FISH OIL) 1000 MG CAPS Take 1,000 mg by mouth every evening.  . pioglitazone (ACTOS) 30 MG tablet Take 30 mg by mouth every evening.   . ranitidine (ZANTAC) 150 MG tablet Take 300 mg by mouth 2 (two) times daily.  . Vitamin A 8000 units TABS Take 1 tablet by mouth daily.  . vitamin E 400 UNIT capsule Take 400 Units by mouth daily.    Allergies  Allergen Reactions  . Livalo [Pitavastatin]     MYALGIAS   . Sulfa Antibiotics Itching    Itching and red spots.    Social History   Social History  . Marital status: Widowed    Spouse name: N/A  . Number of children: 3  . Years of education: 12   Occupational History  .      Retired   Social History Main Topics  . Smoking status: Never Smoker  . Smokeless tobacco: Never Used  . Alcohol use No  . Drug use: No  . Sexual activity: Not Currently   Other Topics Concern  . None   Social History Narrative   She is a widowed mother of 3 with 5 grandchildren. She lives with one of her daughters and grandson. She is a coming by her sister. She tries to walk, but does not do it regularly.   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    family history includes Bladder Cancer in her brother; Coronary artery disease in her brother; Depression in her sister; Diabetes in her brother, brother, mother, and sister; Hyperlipidemia in her mother and sister; Hypertension in her sister; Kidney disease in her brother; Liver cancer in her father; Peripheral Artery Disease in  her brother; Stroke (age of onset: 21) in her brother; Stroke (age of onset: 16) in her mother.  Wt Readings from Last 3 Encounters:  01/24/17 248 lb (112.5 kg)  01/05/17 244 lb (110.7 kg)  12/31/16 245 lb (111.1 kg)    PHYSICAL EXAM BP 110/68   Pulse 74   Ht 5\' 2"  (1.575 m)   Wt 248 lb (112.5 kg)   BMI 45.36 kg/m  Physical Exam  Constitutional: She is oriented to person, place, and time. She appears well-developed and well-nourished. No distress.  Morbidly obese  HENT:  Head: Normocephalic and atraumatic.  Mouth/Throat: No oropharyngeal exudate.  Eyes: EOM are normal.  Neck: No hepatojugular reflux and no JVD present. Carotid bruit is not present.  Cardiovascular: Normal rate, regular rhythm and intact distal pulses.  Exam reveals no gallop and no friction rub.   No murmur heard. Pulmonary/Chest: Effort normal and breath sounds normal. No respiratory distress. She has no wheezes. She has no rales.  Musculoskeletal: Normal range of motion. She exhibits no edema.  Neurological: She is  alert and oriented to person, place, and time.  Skin: Skin is warm and dry. No erythema.  Psychiatric: She has a normal mood and affect. Her behavior is normal. Judgment and thought content normal.  Nursing note and vitals reviewed.   Adult ECG Report Not checked  Other studies Reviewed: Additional studies/ records that were reviewed today include:  Recent Labs: N/A    ASSESSMENT / PLAN: Problem List Items Addressed This Visit    Chest pain with low risk for cardiac etiology (Chronic)    Chest pain symptoms that were concerning for unstable angina, however normal coronary arteries.  Cannot exclude coronary spasm.  Her symptoms seem to have improved since starting the beta-blocker. We will continue current dose of beta-blocker, and aspirin.      Essential hypertension (Chronic)    Stable blood pressure on lisinopril and HCTZ along with carvedilol.      Relevant Medications   ezetimibe  (ZETIA) 10 MG tablet   Hyperlipidemia with target low density lipoprotein (LDL) cholesterol less than 70 mg/dL (Chronic)    Remains on Livalo and fish oil.  Followed by PCP.  Target LDL should be close to 100.      Relevant Medications   ezetimibe (ZETIA) 10 MG tablet      Current medicines are reviewed at length with the patient today. (+/- concerns) N/A The following changes have been made:N/A  Patient Instructions  NO CHANGES WITH CURRENT MEDICATIONS  YOU MAY HOLD A DOSE OF CARVEDILOL IF YOU SOME DIZZINESS.   Your physician wants you to follow-up in [place]. IF YOU FEEL WELL,YOU MAY CANCEL APPOINTMENT. You will receive a reminder letter in the mail two months in advance. If you don't receive a letter, please call our office to schedule the follow-up appointment.    Studies Ordered:   No orders of the defined types were placed in this encounter.     Glenetta Hew, M.D., M.S. Interventional Cardiologist   Pager # [phone] Phone # [phone] [address]. [place], [place] [address]

## 2017-01-25 ENCOUNTER — Encounter: Payer: Self-pay | Admitting: Cardiology

## 2017-01-25 NOTE — Assessment & Plan Note (Signed)
Stable blood pressure on lisinopril and HCTZ along with carvedilol.

## 2017-01-25 NOTE — Assessment & Plan Note (Signed)
Chest pain symptoms that were concerning for unstable angina, however normal coronary arteries.  Cannot exclude coronary spasm.  Her symptoms seem to have improved since starting the beta-blocker. We will continue current dose of beta-blocker, and aspirin.

## 2017-01-25 NOTE — Assessment & Plan Note (Signed)
Remains on Livalo and fish oil.  Followed by PCP.  Target LDL should be close to 100.

## 2017-07-18 DIAGNOSIS — E782 Mixed hyperlipidemia: Secondary | ICD-10-CM | POA: Diagnosis not present

## 2017-07-18 DIAGNOSIS — F411 Generalized anxiety disorder: Secondary | ICD-10-CM | POA: Diagnosis not present

## 2017-07-18 DIAGNOSIS — Z23 Encounter for immunization: Secondary | ICD-10-CM | POA: Diagnosis not present

## 2017-07-18 DIAGNOSIS — Z0000 Encounter for general adult medical examination without abnormal findings: Secondary | ICD-10-CM | POA: Diagnosis not present

## 2017-07-18 DIAGNOSIS — I10 Essential (primary) hypertension: Secondary | ICD-10-CM | POA: Diagnosis not present

## 2017-07-18 DIAGNOSIS — G720 Drug-induced myopathy: Secondary | ICD-10-CM | POA: Diagnosis not present

## 2017-07-18 DIAGNOSIS — E1142 Type 2 diabetes mellitus with diabetic polyneuropathy: Secondary | ICD-10-CM | POA: Diagnosis not present

## 2017-11-16 DIAGNOSIS — H524 Presbyopia: Secondary | ICD-10-CM | POA: Diagnosis not present

## 2017-11-16 DIAGNOSIS — H2513 Age-related nuclear cataract, bilateral: Secondary | ICD-10-CM | POA: Diagnosis not present

## 2017-11-16 DIAGNOSIS — H5203 Hypermetropia, bilateral: Secondary | ICD-10-CM | POA: Diagnosis not present

## 2017-11-16 DIAGNOSIS — E119 Type 2 diabetes mellitus without complications: Secondary | ICD-10-CM | POA: Diagnosis not present

## 2017-12-14 DIAGNOSIS — Z1231 Encounter for screening mammogram for malignant neoplasm of breast: Secondary | ICD-10-CM | POA: Diagnosis not present

## 2017-12-14 DIAGNOSIS — Z6841 Body Mass Index (BMI) 40.0 and over, adult: Secondary | ICD-10-CM | POA: Diagnosis not present

## 2017-12-14 DIAGNOSIS — Z01419 Encounter for gynecological examination (general) (routine) without abnormal findings: Secondary | ICD-10-CM | POA: Diagnosis not present

## 2017-12-14 DIAGNOSIS — N958 Other specified menopausal and perimenopausal disorders: Secondary | ICD-10-CM | POA: Diagnosis not present

## 2018-01-18 DIAGNOSIS — Z23 Encounter for immunization: Secondary | ICD-10-CM | POA: Diagnosis not present

## 2018-01-18 DIAGNOSIS — E782 Mixed hyperlipidemia: Secondary | ICD-10-CM | POA: Diagnosis not present

## 2018-01-18 DIAGNOSIS — I10 Essential (primary) hypertension: Secondary | ICD-10-CM | POA: Diagnosis not present

## 2018-01-18 DIAGNOSIS — E1142 Type 2 diabetes mellitus with diabetic polyneuropathy: Secondary | ICD-10-CM | POA: Diagnosis not present

## 2018-01-25 ENCOUNTER — Encounter: Payer: Self-pay | Admitting: Cardiology

## 2018-01-25 ENCOUNTER — Ambulatory Visit (INDEPENDENT_AMBULATORY_CARE_PROVIDER_SITE_OTHER): Payer: Medicare Other | Admitting: Cardiology

## 2018-01-25 VITALS — BP 142/70 | HR 86 | Ht 62.0 in | Wt 252.8 lb

## 2018-01-25 DIAGNOSIS — I10 Essential (primary) hypertension: Secondary | ICD-10-CM

## 2018-01-25 DIAGNOSIS — E785 Hyperlipidemia, unspecified: Secondary | ICD-10-CM

## 2018-01-25 MED ORDER — ROSUVASTATIN CALCIUM 5 MG PO TABS: 5.0000 mg | ORAL_TABLET | Freq: Every day | ORAL | 11 refills | Status: DC

## 2018-01-25 NOTE — Patient Instructions (Signed)
Medication Instructions:  CONTINUE CURRENT MEDICATIONS  --START ROSUVASTATIN ( CRESTOR) 5 MG  - FOR THE FIRST MONTH TAKE  1 TABLET TWICE A WEEK ,AFTER THE THE FIRST MONTH  INCREASE BY ONE TABLET  A DAY  EVERY 2 WEEKS UNTIL YOU ARE TAKING 5 MG DAILY. IF YOU START FEELING BAD AFTER THE INCREASE GO BACK TO THE PREVIOUS DOSE WEEKLY.   If you need a refill on your cardiac medications before your next appointment, please call your pharmacy.   Lab work: LIPID  LIVER PROFILE IN 4 MONTHS - WILL MAIL LABSLIP If you have labs (blood work) drawn today and your tests are completely normal, you will receive your results only by: Marland Kitchen MyChart Message (if you have MyChart) OR . A paper copy in the mail If you have any lab test that is abnormal or we need to change your treatment, we will call you to review the results.  Testing/Procedures: NOT NEEED  Follow-Up: At [facility], you and your health needs are our priority.  As part of our continuing mission to provide you with exceptional heart care, we have created designated Provider Care Teams.  These Care Teams include your primary Cardiologist (physician) and Advanced Practice Providers (APPs -  Physician Assistants and Nurse Practitioners) who all work together to provide you with the care you need, when you need it. You will need a follow up appointment in 12 months.  Please call our office 2 months in advance to schedule this appointment.  You may see Glenetta Hew, MD or one of the following Advanced Practice Providers on your designated Care Team:   Rosaria Ferries, PA-C . Jory Sims, DNP, ANP  Any Other Special Instructions Will Be Listed Below (If Applicable).  KEEP  RECORD OF  BLOOD PRESSURE - BLOOD PRESSURES SHOULD READ 140 OR LESS THE TOP NUMBER - IF CONCSISTENTLY ABOVE DISCUU WITH PRIMARY.

## 2018-01-25 NOTE — Progress Notes (Signed)
PCP: Orpah Melter, MD  Clinic Note: Chief Complaint  Patient presents with  . Follow-up    No more chest pain    HPI: [name] is a 72 y.o. female with a PMH below who presents today for annual Cardiology f/u.  [name] was initially seen on October 5 for the evaluation of chest pain concerning for unstable angina at the request of Orpah Melter, MD.  She had 2 concerning episodes for possible unstable angina and has had exertional dyspnea.  Therefore based on her presenting symptoms, we felt that the best course of action will be to proceed with cardiac catheterization that was done on January 05 2017 revealing no significant coronary disease.  Normal EF.Marland Kitchen  She was last seen in Late Oct 2018 following her Cardiac Cath.  She was doing quite well with no further episodes chest pain or pressure.  Was pre-much back to baseline.  Recent Hospitalizations: None  Studies Personally Reviewed - (if available, images/films reviewed: From Epic Chart or Care Everywhere)  None  Interval History: [name] returns today still doing quite well with no major cardiac complaints.  She has not had any further chest pain or pressure with rest or exertion.  Was so happy about the results of her test last year that she has become more active.  Despite this, she has not really lost any weight.  Her diabetes doctor has changed some medications on her, but otherwise not much is happening last year.  One new medicine was that she just started Zetia. No heart failure symptoms of PND, orthopnea or edema.  No rapid irregular heartbeats or palpitations.  Feels much better on beta-blocker than before. No syncope or near syncope, no TIA or amaurosis fugax. No claudication.  ROS: A comprehensive was performed. Review of Systems  Constitutional: Negative for malaise/fatigue.  HENT: Negative for congestion and nosebleeds.   Respiratory: Negative for cough, shortness of breath and wheezing.     Cardiovascular:       Per HPI  Gastrointestinal: Negative for blood in stool, constipation and melena.  Genitourinary: Negative for hematuria.  Musculoskeletal: Negative for myalgias.  Neurological: Negative for dizziness and weakness.    I have reviewed and (if needed) personally updated the patient's problem list, medications, allergies, past medical and surgical history, social and family history.   Past Medical History:  Diagnosis Date  . Actinic keratosis    Followed by Dr. Allyson Sabal  . Anxiety   . Diabetes mellitus, type II, insulin dependent ([place])    Associated with obesity: She takes Lantus, Victoza & Actos  . Essential hypertension   . GERD (gastroesophageal reflux disease)   . Hyperlipidemia   . Morbid obesity with BMI of 40.0-44.9, adult [facility])     Past Surgical History:  Procedure Laterality Date  . BREAST BIOPSY  1997  . LEFT HEART CATH AND CORONARY ANGIOGRAPHY N/A 01/05/2017   Procedure: LEFT HEART CATH AND CORONARY ANGIOGRAPHY;  Surgeon: Leonie Man, MD;  Location: [place] [facility];  Service: Cardiovascular: Angiographically normal coronary arteries with a right dominant system.  Normal EF 55-65%.    Current Meds  Medication Sig  . aspirin EC 81 MG tablet Take 81 mg by mouth daily.  . Calcium Carb-Cholecalciferol (HM CALCIUM 600 & VITAMIN D3) 600-800 MG-UNIT TABS Take 1 tablet by mouth 2 (two) times daily.  . carvedilol (COREG) 3.125 MG tablet Take 1 tablet (3.125 mg total) by mouth 2 (two) times daily.  . cholecalciferol (VITAMIN D)  1000 units tablet Take 1,000 Units by mouth daily.  . Cranberry-Vitamin C (AZO CRANBERRY URINARY TRACT PO) Take 1 tablet by mouth 2 (two) times daily.  Marland Kitchen ezetimibe (ZETIA) 10 MG tablet Take 10 mg by mouth daily.  Marland Kitchen LANTUS SOLOSTAR 100 UNIT/ML Solostar Pen Inject 50 Units into the skin daily. Before breakfast  . liraglutide (VICTOZA) 18 MG/3ML SOPN Inject 1.2 mg into the skin every evening.  Marland Kitchen lisinopril-hydrochlorothiazide  (PRINZIDE,ZESTORETIC) 20-12.5 MG tablet Take 1 tablet by mouth daily.  . Naproxen Sod-Diphenhydramine (ALEVE PM) 220-25 MG TABS Take 2 tablets by mouth at bedtime as needed (sleep).  . Omega-3 Fatty Acids (FISH OIL) 1000 MG CAPS Take 1,000 mg by mouth every evening.  . pioglitazone (ACTOS) 30 MG tablet Take 30 mg by mouth every evening.   . Vitamin A 8000 units TABS Take 1 tablet by mouth daily.  . vitamin E 400 UNIT capsule Take 400 Units by mouth daily.    Allergies  Allergen Reactions  . Livalo [Pitavastatin]     MYALGIAS   . Sulfa Antibiotics Itching    Itching and red spots.    Social History   Tobacco Use  . Smoking status: Never Smoker  . Smokeless tobacco: Never Used  Substance Use Topics  . Alcohol use: No  . Drug use: No   Social History   Social History Narrative   She is a widowed mother of 3 with 5 grandchildren. She lives with one of her daughters and grandson. She is a coming by her sister. She tries to walk, but does not do it regularly.   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   Family History family history includes Bladder Cancer in her brother; Coronary artery disease in her brother; Depression in her sister; Diabetes in her brother, brother, mother, and sister; Hyperlipidemia in her mother and sister; Hypertension in her sister; Kidney disease in her brother; Liver cancer in her father; Peripheral Artery Disease in her brother; Stroke (age of onset: 45) in her brother; Stroke (age of onset: 6) in her mother.  Wt Readings from Last 3 Encounters:  01/25/18 252 lb 12.8 oz (114.7 kg)  01/24/17 248 lb (112.5 kg)  01/05/17 244 lb (110.7 kg)    PHYSICAL EXAM BP (!) 142/70   Pulse 86   Ht 5\' 2"  (1.575 m)   Wt 252 lb 12.8 oz (114.7 kg)   BMI 46.24 kg/m  Physical Exam  Constitutional: She is oriented to person, place, and time. She appears well-developed and well-nourished. No distress.  Morbidly obese  HENT:  Head: Normocephalic and  atraumatic.  Mouth/Throat: No oropharyngeal exudate.  Eyes: EOM are normal.  Neck: Normal range of motion. Neck supple. No hepatojugular reflux and no JVD present. Carotid bruit is not present.  Cardiovascular: Normal rate, regular rhythm, S1 normal, S2 normal and intact distal pulses.  No extrasystoles are present. PMI is not displaced (Unable to palpate). Exam reveals distant heart sounds. Exam reveals no gallop and no friction rub.  No murmur heard. Pulmonary/Chest: Effort normal and breath sounds normal. No respiratory distress. She has no wheezes. She has no rales.  Abdominal: Soft. Bowel sounds are normal. She exhibits no distension. There is no tenderness.  Musculoskeletal: Normal range of motion. She exhibits no edema (Trivial ankle).  Neurological: She is alert and oriented to person, place, and time.  Psychiatric: She has a normal mood and affect. Her behavior is normal. Judgment and thought content normal.  Nursing note  and vitals reviewed.   Adult ECG Report NSR, rate 86.  Cannot rule out anterior MI, age undetermined with poor R wave progression.  Stable EKG  Other studies Reviewed: Additional studies/ records that were reviewed today include:  Recent Labs: From [facility] January 18, 2017  A1c 7.3.  Creatinine 0.7; TC 220, TG 147, HDL 48, LDL 142   ASSESSMENT / PLAN: Problem List Items Addressed This Visit    Essential hypertension (Chronic)    Borderline blood pressure control today with current dose of carvedilol, lisinopril and HCTZ.  Low threshold to increase carvedilol to 6.25 twice daily.      Relevant Medications   rosuvastatin (CRESTOR) 5 MG tablet   Other Relevant Orders   EKG 12-Lead (Completed)   Hyperlipidemia with target low density lipoprotein (LDL) cholesterol less than 100 mg/dL - Primary (Chronic)    Previously on Livalo.  No longer on it.  She is only on Zetia which was just started. Her LDL is 142, there is no way to get down without treatment. Plan:  Start rosuvastatin and titrate up.  --START ROSUVASTATIN ( CRESTOR) 5 MG  - FOR THE FIRST MONTH TAKE  1 TABLET TWICE A WEEK ,AFTER THE THE FIRST MONTH  INCREASE BY ONE TABLET  A DAY  EVERY 2 WEEKS UNTIL YOU ARE TAKING 5 MG DAILY. IF YOU START FEELING BAD AFTER THE INCREASE GO BACK TO THE PREVIOUS DOSE WEEKLY. Recheck lipids in 4 months      Relevant Medications   rosuvastatin (CRESTOR) 5 MG tablet   Other Relevant Orders   Lipid panel   Hepatic function panel   Morbid obesity ([place]) (Chronic)    With hypertension hyperlipidemia and diabetes, she does have metabolic syndrome which puts her at high risk for cardiac conditions.  We discussed the importance of actually really getting consistent with exercise and watching her diet.      Relevant Orders   EKG 12-Lead (Completed)   Lipid panel   Hepatic function panel     She is indicated that she would want to continue to follow-up with me at least annually.  Current medicines are reviewed at length with the patient today. (+/- concerns) N/A The following changes have been made:N/A  Patient Instructions  Medication Instructions:  CONTINUE CURRENT MEDICATIONS  --START ROSUVASTATIN ( CRESTOR) 5 MG  - FOR THE FIRST MONTH TAKE  1 TABLET TWICE A WEEK ,AFTER THE THE FIRST MONTH  INCREASE BY ONE TABLET  A DAY  EVERY 2 WEEKS UNTIL YOU ARE TAKING 5 MG DAILY. IF YOU START FEELING BAD AFTER THE INCREASE GO BACK TO THE PREVIOUS DOSE WEEKLY.   If you need a refill on your cardiac medications before your next appointment, please call your pharmacy.   Lab work: LIPID  LIVER PROFILE IN 4 MONTHS - WILL MAIL LABSLIP If you have labs (blood work) drawn today and your tests are completely normal, you will receive your results only by: Marland Kitchen MyChart Message (if you have MyChart) OR . A paper copy in the mail If you have any lab test that is abnormal or we need to change your treatment, we will call you to review the results.  Testing/Procedures: NOT  NEEED  Follow-Up: At [facility], you and your health needs are our priority.  As part of our continuing mission to provide you with exceptional heart care, we have created designated Provider Care Teams.  These Care Teams include your primary Cardiologist (physician) and Advanced Practice Providers (APPs -  Physician Assistants and Nurse Practitioners) who all work together to provide you with the care you need, when you need it. You will need a follow up appointment in 12 months.  Please call our office 2 months in advance to schedule this appointment.  You may see Glenetta Hew, MD or one of the following Advanced Practice Providers on your designated Care Team:   Rosaria Ferries, PA-C . Jory Sims, DNP, ANP  Any Other Special Instructions Will Be Listed Below (If Applicable).  KEEP  RECORD OF  BLOOD PRESSURE - BLOOD PRESSURES SHOULD READ 140 OR LESS THE TOP NUMBER - IF CONCSISTENTLY ABOVE DISCUU WITH PRIMARY.   Studies Ordered:   Orders Placed This Encounter  Procedures  . Lipid panel  . Hepatic function panel  . EKG 12-Lead      Glenetta Hew, M.D., M.S. Interventional Cardiologist   Pager # [phone] Phone # [phone] [address] [place], [place] [address]

## 2018-01-27 ENCOUNTER — Encounter: Payer: Self-pay | Admitting: Cardiology

## 2018-01-27 NOTE — Assessment & Plan Note (Signed)
Previously on Livalo.  No longer on it.  She is only on Zetia which was just started. Her LDL is 142, there is no way to get down without treatment. Plan: Start rosuvastatin and titrate up.  --START ROSUVASTATIN ( CRESTOR) 5 MG  - FOR THE FIRST MONTH TAKE  1 TABLET TWICE A WEEK ,AFTER THE THE FIRST MONTH  INCREASE BY ONE TABLET  A DAY  EVERY 2 WEEKS UNTIL YOU ARE TAKING 5 MG DAILY. IF YOU START FEELING BAD AFTER THE INCREASE GO BACK TO THE PREVIOUS DOSE WEEKLY. Recheck lipids in 4 months

## 2018-01-27 NOTE — Assessment & Plan Note (Signed)
Borderline blood pressure control today with current dose of carvedilol, lisinopril and HCTZ.  Low threshold to increase carvedilol to 6.25 twice daily.

## 2018-01-27 NOTE — Assessment & Plan Note (Signed)
With hypertension hyperlipidemia and diabetes, she does have metabolic syndrome which puts her at high risk for cardiac conditions.  We discussed the importance of actually really getting consistent with exercise and watching her diet.

## 2018-05-08 ENCOUNTER — Other Ambulatory Visit: Payer: Self-pay | Admitting: *Deleted

## 2018-05-08 ENCOUNTER — Telehealth: Payer: Self-pay | Admitting: *Deleted

## 2018-05-08 DIAGNOSIS — E785 Hyperlipidemia, unspecified: Secondary | ICD-10-CM

## 2018-05-08 NOTE — Telephone Encounter (Signed)
-----   Message from Raiford Simmonds, RN sent at 01/25/2018 11:32 AM EDT ----- LAB LIPID ,LIVER PROFILE @3 /1/20 WILL  MAIL @ 04/29/18

## 2018-05-08 NOTE — Telephone Encounter (Signed)
MAILED LETTER AND LABSLIP 

## 2018-05-22 DIAGNOSIS — E785 Hyperlipidemia, unspecified: Secondary | ICD-10-CM | POA: Diagnosis not present

## 2018-05-22 LAB — LIPID PANEL
CHOL/HDL RATIO: 3.4 ratio (ref 0.0–4.4)
CHOLESTEROL TOTAL: 161 mg/dL (ref 100–199)
HDL: 48 mg/dL (ref >39)
LDL Calculated: 91 mg/dL (ref 0–99)
TRIGLYCERIDES: 112 mg/dL (ref 0–149)
VLDL Cholesterol Cal: 22 mg/dL (ref 5–40)

## 2018-05-22 LAB — HEPATIC FUNCTION PANEL
ALK PHOS: 92 IU/L (ref 39–117)
ALT: 19 IU/L (ref 0–32)
AST: 11 IU/L (ref 0–40)
Albumin: 4 g/dL (ref 3.7–4.7)
BILIRUBIN TOTAL: 0.5 mg/dL (ref 0.0–1.2)
BILIRUBIN, DIRECT: 0.14 mg/dL (ref 0.00–0.40)
Total Protein: 6.3 g/dL (ref 6.0–8.5)

## 2018-11-20 DIAGNOSIS — H52203 Unspecified astigmatism, bilateral: Secondary | ICD-10-CM | POA: Diagnosis not present

## 2018-11-20 DIAGNOSIS — E119 Type 2 diabetes mellitus without complications: Secondary | ICD-10-CM | POA: Diagnosis not present

## 2018-11-20 DIAGNOSIS — H5203 Hypermetropia, bilateral: Secondary | ICD-10-CM | POA: Diagnosis not present

## 2018-12-08 DIAGNOSIS — I10 Essential (primary) hypertension: Secondary | ICD-10-CM | POA: Diagnosis not present

## 2018-12-08 DIAGNOSIS — E1165 Type 2 diabetes mellitus with hyperglycemia: Secondary | ICD-10-CM | POA: Diagnosis not present

## 2018-12-08 DIAGNOSIS — E1142 Type 2 diabetes mellitus with diabetic polyneuropathy: Secondary | ICD-10-CM | POA: Diagnosis not present

## 2018-12-08 DIAGNOSIS — Z0000 Encounter for general adult medical examination without abnormal findings: Secondary | ICD-10-CM | POA: Diagnosis not present

## 2018-12-08 DIAGNOSIS — E782 Mixed hyperlipidemia: Secondary | ICD-10-CM | POA: Diagnosis not present

## 2018-12-08 DIAGNOSIS — Z23 Encounter for immunization: Secondary | ICD-10-CM | POA: Diagnosis not present

## 2019-01-03 DIAGNOSIS — Z6839 Body mass index (BMI) 39.0-39.9, adult: Secondary | ICD-10-CM | POA: Diagnosis not present

## 2019-01-03 DIAGNOSIS — Z1231 Encounter for screening mammogram for malignant neoplasm of breast: Secondary | ICD-10-CM | POA: Diagnosis not present

## 2019-01-03 DIAGNOSIS — Z01419 Encounter for gynecological examination (general) (routine) without abnormal findings: Secondary | ICD-10-CM | POA: Diagnosis not present

## 2019-01-12 ENCOUNTER — Telehealth: Payer: Self-pay | Admitting: *Deleted

## 2019-01-12 NOTE — Telephone Encounter (Signed)
SPOKE TO PATIENT - IN REGARDS TO  SWITCHING 10/19/ 20  VISIT TO VIRTUAL  PATIENT PREFERRED TO HAVE IN OFFICE VISIT. APPOINTMENT RESCHEDULE TO 02/12/19  AT 9 AM   PATIENT VERBALIZED UNDERSTANDING

## 2019-01-15 ENCOUNTER — Ambulatory Visit: Payer: Medicare Other | Admitting: Cardiology

## 2019-02-12 ENCOUNTER — Ambulatory Visit (INDEPENDENT_AMBULATORY_CARE_PROVIDER_SITE_OTHER): Payer: Medicare Other | Admitting: Cardiology

## 2019-02-12 ENCOUNTER — Other Ambulatory Visit: Payer: Self-pay

## 2019-02-12 VITALS — BP 155/81 | HR 63 | Temp 97.0°F | Ht 61.0 in | Wt 204.0 lb

## 2019-02-12 DIAGNOSIS — E785 Hyperlipidemia, unspecified: Secondary | ICD-10-CM

## 2019-02-12 DIAGNOSIS — R079 Chest pain, unspecified: Secondary | ICD-10-CM | POA: Diagnosis not present

## 2019-02-12 DIAGNOSIS — I10 Essential (primary) hypertension: Secondary | ICD-10-CM | POA: Diagnosis not present

## 2019-02-12 NOTE — Assessment & Plan Note (Signed)
No more CP symptoms.
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
## 2019-02-12 NOTE — Assessment & Plan Note (Signed)
~  48 lb wgt loss since last year -- Excellent work!!  Congratulated her on her efforts, keep it up.  Feels much better.
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
## 2019-02-12 NOTE — Patient Instructions (Signed)
Medication Instructions:  NO CHANGES   Lab Work: NOT NEEDED  Testing/Procedures: NOT NEEDED  Follow-Up: At [facility], you and your health needs are our priority.  As part of our continuing mission to provide you with exceptional heart care, we have created designated Provider Care Teams.  These Care Teams include your primary Cardiologist (physician) and Advanced Practice Providers (APPs -  Physician Assistants and Nurse Practitioners) who all work together to provide you with the care you need, when you need it.  Your next appointment:   AS NEEDED  The format for your next appointment:   In Person AS NEEDED  Provider:   Glenetta Hew, MD  Other Instructions

## 2019-02-12 NOTE — Assessment & Plan Note (Signed)
Lipid panel looks great -- likely related to combination of Zetia/rosuvastatin & ~48 lb wgt loss.  Tolerating Rx - no change.

## 2019-02-14 ENCOUNTER — Encounter: Payer: Self-pay | Admitting: Cardiology

## 2019-03-30 DIAGNOSIS — I639 Cerebral infarction, unspecified: Secondary | ICD-10-CM

## 2019-03-30 HISTORY — DX: Cerebral infarction, unspecified: I63.9

## 2019-07-31 ENCOUNTER — Encounter (HOSPITAL_COMMUNITY): Payer: Self-pay | Admitting: Emergency Medicine

## 2019-07-31 ENCOUNTER — Inpatient Hospital Stay (HOSPITAL_COMMUNITY)
Admission: EM | Admit: 2019-07-31 | Discharge: 2019-08-02 | DRG: 042 | Disposition: A | Discharge status: Home / Self Care | Payer: Medicare Other | Source: Ambulatory Visit | Attending: Internal Medicine | Admitting: Internal Medicine

## 2019-07-31 ENCOUNTER — Emergency Department (HOSPITAL_COMMUNITY): Payer: Medicare Other

## 2019-07-31 ENCOUNTER — Other Ambulatory Visit: Payer: Self-pay

## 2019-07-31 DIAGNOSIS — I6389 Other cerebral infarction: Secondary | ICD-10-CM | POA: Diagnosis not present

## 2019-07-31 DIAGNOSIS — R824 Acetonuria: Secondary | ICD-10-CM | POA: Diagnosis present

## 2019-07-31 DIAGNOSIS — Z794 Long term (current) use of insulin: Secondary | ICD-10-CM

## 2019-07-31 DIAGNOSIS — I63441 Cerebral infarction due to embolism of right cerebellar artery: Secondary | ICD-10-CM | POA: Diagnosis present

## 2019-07-31 DIAGNOSIS — Z8249 Family history of ischemic heart disease and other diseases of the circulatory system: Secondary | ICD-10-CM | POA: Diagnosis not present

## 2019-07-31 DIAGNOSIS — R29700 NIHSS score 0: Secondary | ICD-10-CM | POA: Diagnosis present

## 2019-07-31 DIAGNOSIS — Z20822 Contact with and (suspected) exposure to covid-19: Secondary | ICD-10-CM | POA: Diagnosis present

## 2019-07-31 DIAGNOSIS — Z882 Allergy status to sulfonamides status: Secondary | ICD-10-CM | POA: Diagnosis not present

## 2019-07-31 DIAGNOSIS — Z79899 Other long term (current) drug therapy: Secondary | ICD-10-CM | POA: Diagnosis not present

## 2019-07-31 DIAGNOSIS — R2689 Other abnormalities of gait and mobility: Secondary | ICD-10-CM | POA: Diagnosis present

## 2019-07-31 DIAGNOSIS — Z83438 Family history of other disorder of lipoprotein metabolism and other lipidemia: Secondary | ICD-10-CM

## 2019-07-31 DIAGNOSIS — E785 Hyperlipidemia, unspecified: Secondary | ICD-10-CM | POA: Diagnosis present

## 2019-07-31 DIAGNOSIS — Z841 Family history of disorders of kidney and ureter: Secondary | ICD-10-CM | POA: Diagnosis not present

## 2019-07-31 DIAGNOSIS — I361 Nonrheumatic tricuspid (valve) insufficiency: Secondary | ICD-10-CM | POA: Diagnosis not present

## 2019-07-31 DIAGNOSIS — E118 Type 2 diabetes mellitus with unspecified complications: Secondary | ICD-10-CM | POA: Diagnosis present

## 2019-07-31 DIAGNOSIS — Z888 Allergy status to other drugs, medicaments and biological substances status: Secondary | ICD-10-CM | POA: Diagnosis not present

## 2019-07-31 DIAGNOSIS — Z800 Family history of malignant neoplasm of digestive organs: Secondary | ICD-10-CM | POA: Diagnosis not present

## 2019-07-31 DIAGNOSIS — Z823 Family history of stroke: Secondary | ICD-10-CM | POA: Diagnosis not present

## 2019-07-31 DIAGNOSIS — I071 Rheumatic tricuspid insufficiency: Secondary | ICD-10-CM | POA: Diagnosis present

## 2019-07-31 DIAGNOSIS — I10 Essential (primary) hypertension: Secondary | ICD-10-CM | POA: Diagnosis present

## 2019-07-31 DIAGNOSIS — Z8052 Family history of malignant neoplasm of bladder: Secondary | ICD-10-CM | POA: Diagnosis not present

## 2019-07-31 DIAGNOSIS — I639 Cerebral infarction, unspecified: Secondary | ICD-10-CM | POA: Diagnosis not present

## 2019-07-31 DIAGNOSIS — E1165 Type 2 diabetes mellitus with hyperglycemia: Secondary | ICD-10-CM | POA: Diagnosis present

## 2019-07-31 DIAGNOSIS — D72829 Elevated white blood cell count, unspecified: Secondary | ICD-10-CM | POA: Diagnosis present

## 2019-07-31 DIAGNOSIS — K219 Gastro-esophageal reflux disease without esophagitis: Secondary | ICD-10-CM | POA: Diagnosis present

## 2019-07-31 DIAGNOSIS — Z833 Family history of diabetes mellitus: Secondary | ICD-10-CM | POA: Diagnosis not present

## 2019-07-31 DIAGNOSIS — R81 Glycosuria: Secondary | ICD-10-CM | POA: Diagnosis present

## 2019-07-31 DIAGNOSIS — R42 Dizziness and giddiness: Secondary | ICD-10-CM

## 2019-07-31 LAB — URINALYSIS, ROUTINE W REFLEX MICROSCOPIC
Bacteria, UA: NONE SEEN
Bilirubin Urine: NEGATIVE
Glucose, UA: >=500 mg/dL — AB
Hgb urine dipstick: NEGATIVE
Ketones, ur: 20 mg/dL — AB
Leukocytes,Ua: NEGATIVE
Nitrite: NEGATIVE
Protein, ur: NEGATIVE mg/dL
Specific Gravity, Urine: 1.038 — ABNORMAL HIGH (ref 1.005–1.030)
pH: 6 (ref 5.0–8.0)

## 2019-07-31 LAB — CBC
HCT: 44.7 % (ref 36.0–46.0)
Hemoglobin: 14.5 g/dL (ref 12.0–15.0)
MCH: 31.2 pg (ref 26.0–34.0)
MCHC: 32.4 g/dL (ref 30.0–36.0)
MCV: 96.1 fL (ref 80.0–100.0)
Platelets: 291 10*3/uL (ref 150–400)
RBC: 4.65 MIL/uL (ref 3.87–5.11)
RDW: 12 % (ref 11.5–15.5)
WBC: 12.3 10*3/uL — ABNORMAL HIGH (ref 4.0–10.5)
nRBC: 0 % (ref 0.0–0.2)

## 2019-07-31 LAB — BASIC METABOLIC PANEL
Anion gap: 14 (ref 5–15)
BUN: 15 mg/dL (ref 8–23)
CO2: 22 mmol/L (ref 22–32)
Calcium: 9.3 mg/dL (ref 8.9–10.3)
Chloride: 99 mmol/L (ref 98–111)
Creatinine, Ser: 0.66 mg/dL (ref 0.44–1.00)
GFR calc Af Amer: >60 mL/min (ref >60)
GFR calc non Af Amer: >60 mL/min (ref >60)
Glucose, Bld: 392 mg/dL — ABNORMAL HIGH (ref 70–99)
Potassium: 4.2 mmol/L (ref 3.5–5.1)
Sodium: 135 mmol/L (ref 135–145)

## 2019-07-31 LAB — GLUCOSE, CAPILLARY: Glucose-Capillary: 233 mg/dL — ABNORMAL HIGH (ref 70–99)

## 2019-07-31 LAB — CBG MONITORING, ED: Glucose-Capillary: 375 mg/dL — ABNORMAL HIGH (ref 70–99)

## 2019-07-31 MED ORDER — ASPIRIN 325 MG PO TABS
325.0000 mg | ORAL_TABLET | Freq: Every day | ORAL | Status: DC
  Administered 2019-07-31: 325 mg via ORAL
  Filled 2019-07-31: qty 1

## 2019-07-31 MED ORDER — ASPIRIN 300 MG RE SUPP: 300.0000 mg | Freq: Every day | RECTAL | Status: DC

## 2019-07-31 MED ORDER — SODIUM CHLORIDE 0.9 % IV BOLUS
1000.0000 mL | Freq: Once | INTRAVENOUS | Status: AC
  Administered 2019-07-31: 1000 mL via INTRAVENOUS

## 2019-07-31 MED ORDER — INSULIN ASPART 100 UNIT/ML ~~LOC~~ SOLN
0.0000 [IU] | Freq: Three times a day (TID) | SUBCUTANEOUS | Status: DC
  Administered 2019-08-01 (×2): 3 [IU] via SUBCUTANEOUS
  Administered 2019-08-02: 5 [IU] via SUBCUTANEOUS
  Administered 2019-08-02: 8 [IU] via SUBCUTANEOUS

## 2019-07-31 MED ORDER — STROKE: EARLY STAGES OF RECOVERY BOOK
Freq: Once | Status: AC
  Administered 2019-07-31: 1
  Filled 2019-07-31: qty 1

## 2019-07-31 MED ORDER — MECLIZINE HCL 25 MG PO TABS
12.5000 mg | ORAL_TABLET | Freq: Once | ORAL | Status: AC
  Administered 2019-07-31: 12.5 mg via ORAL
  Filled 2019-07-31: qty 1

## 2019-07-31 MED ORDER — SODIUM CHLORIDE 0.9% FLUSH
3.0000 mL | Freq: Once | INTRAVENOUS | Status: AC
  Administered 2019-07-31: 14:00:00 3 mL via INTRAVENOUS

## 2019-07-31 NOTE — H&P (Signed)
History and Physical    [name] DOB: 04-24-1945 DOA: 07/31/2019  PCP: Orpah Melter, MD  Patient coming from: Home.  I have personally briefly reviewed patient's old medical records in [place]  Chief Complaint: Dizziness.  HPI: [name] is a 74 y.o. female with medical history significant of actinic keratosis, type 2 diabetes, essential hypertension, GERD, hyperlipidemia, morbid obesity who is coming to the emergency department due to on and off dizziness for the past few months, but this has been particularly very intense over the last 2 days.  She denies nausea, emesis, vertigo sensation, does state that she feels unsteady when walking.  No headache, blurred vision, slurred speech, confusion, asymmetrical facial expression or focal weakness.  She denies fever, chills, sore throat, rhinorrhea, wheezing or hemoptysis.  No chest pain, palpitations, diaphoresis, PND, orthopnea or recent pitting edema of the lower extremities.  She denies abdominal pain, diarrhea, constipation, melena or hematochezia.  No dysuria, frequency or materia.  No polyuria, polydipsia, polyphagia or blurred vision.  ED Course: Initial vital signs temperature 97.7 F, pulse 74, respirations 18, blood pressure 154/84 mmHg and O2 sat 94% on room air.  The patient was given 12.5 mg of Antivert and a 1000 mL NS bolus.  Urinalysis showed increase in a specific gravity, glucosuria more than 500 and ketonuria of 20 mg/dL.  CBC shows a white count 12.3, hemoglobin 14.5 g/dL and platelets 291.BMP shows a glucose of 392 mg/dL, but all other values are normal.  SARS coronavirus 2 was negative.  CT head without contrast did not show any acute intracranial findings.  However, MRI of brain without contrast showed multiple small acute right cerebellar infarcts.  Minor chronic microvascular ischemic changes.  Please see images and full detailed report for further detail.  Review of Systems: As per  HPI otherwise all other systems reviewed and are negative.  Past Medical History:  Diagnosis Date  . Actinic keratosis    Followed by Dr. Allyson Sabal  . Anxiety   . Diabetes mellitus, type II, insulin dependent ([place])    Associated with obesity: She takes Lantus, Victoza & Actos  . Essential hypertension   . GERD (gastroesophageal reflux disease)   . Hyperlipidemia   . Morbid obesity with BMI of 40.0-44.9, adult [facility])     Past Surgical History:  Procedure Laterality Date  . BREAST BIOPSY  1997  . LEFT HEART CATH AND CORONARY ANGIOGRAPHY N/A 01/05/2017   Procedure: LEFT HEART CATH AND CORONARY ANGIOGRAPHY;  Surgeon: Leonie Man, MD;  Location: [facility];  Service: Cardiovascular: Angiographically normal coronary arteries with a right dominant system.  Normal EF 55-65%.    Social History  reports that she has never smoked. She has never used smokeless tobacco. She reports that she does not drink alcohol or use drugs.  Allergies  Allergen Reactions  . Livalo [Pitavastatin]     MYALGIAS   . Sulfa Antibiotics Itching    Itching and red spots.    Family History  Problem Relation Age of Onset  . Diabetes Mother   . Hyperlipidemia Mother   . Stroke Mother 49  . Liver cancer Father   . Hypertension Sister   . Hyperlipidemia Sister   . Diabetes Sister        On insulin  . Depression Sister   . Kidney disease Brother        After dialysis he had renal transplant  . Diabetes Brother   . Coronary artery disease Brother   .  Peripheral Artery Disease Brother   . Stroke Brother 78  . Bladder Cancer Brother   . Diabetes Brother    Prior to Admission medications   Medication Sig Start Date End Date Taking? Authorizing Provider  LANTUS SOLOSTAR 100 UNIT/ML Solostar Pen Inject 50 Units into the skin daily. Before breakfast 12/17/16  Yes [provider]  liraglutide (VICTOZA) 18 MG/3ML SOPN Inject 1.2 mg into the skin every evening.   Yes [provider]    Naproxen Sod-Diphenhydramine (ALEVE PM) 220-25 MG TABS Take 2 tablets by mouth at bedtime as needed (sleep).   Yes [provider]  carvedilol (COREG) 3.125 MG tablet Take 1 tablet (3.125 mg total) by mouth 2 (two) times daily. 12/31/16 02/12/19  Leonie Man, MD  lisinopril-hydrochlorothiazide (PRINZIDE,ZESTORETIC) 20-12.5 MG tablet Take 1 tablet by mouth daily. 12/27/16   [provider]  nitroGLYCERIN (NITROSTAT) 0.4 MG SL tablet Place 1 tablet (0.4 mg total) under the tongue every 5 (five) minutes as needed for chest pain. 12/31/16 02/12/19  Leonie Man, MD  pioglitazone (ACTOS) 30 MG tablet Take 30 mg by mouth every evening.  12/27/16   [provider]    Physical Exam: Vitals:   07/31/19 2000 07/31/19 2100 07/31/19 2116 07/31/19 2221  BP:   (!) 176/70 (!) 158/51  Pulse: 67 65 74 70  Resp: 11 13 17 18   Temp:    98.3 F (36.8 C)  TempSrc:    Axillary  SpO2: 100% 97% 98% 97%    Constitutional: NAD, calm, comfortable Eyes: PERRL, lids and conjunctivae normal ENMT: Mucous membranes are moist. Posterior pharynx clear of any exudate or lesions. Neck: normal, supple, no masses, no thyromegaly Respiratory: clear to auscultation bilaterally, no wheezing, no crackles. Normal respiratory effort. No accessory muscle use.  Cardiovascular: Regular rate and rhythm, no murmurs / rubs / gallops. No extremity edema. 2+ pedal pulses. No carotid bruits.  Abdomen: Obese, nondistended.  BS positive, soft no tenderness, no masses palpated. No hepatosplenomegaly. Musculoskeletal: no clubbing / cyanosis. Good ROM, no contractures. Normal muscle tone.  Skin: no clinically significant rashes, lesions, ulcers on very limited dermatological examination. Neurologic: CN 2-12 grossly intact. Sensation intact, DTR normal. Strength 5/5 in all 4.  Psychiatric: Normal judgment and insight. Alert and oriented x 3. Normal mood.  Romberg test and gait deferred.  Labs on Admission: I  have personally reviewed following labs and imaging studies  CBC: Recent Labs  Lab 07/31/19 1113  WBC 12.3*  HGB 14.5  HCT 44.7  MCV 96.1  PLT [id number]    Basic Metabolic Panel: Recent Labs  Lab 07/31/19 1113  NA 135  K 4.2  CL 99  CO2 22  GLUCOSE 392*  BUN 15  CREATININE 0.66  CALCIUM 9.3    GFR: CrCl cannot be calculated (Unknown ideal weight.).  Liver Function Tests: No results for input(s): AST, ALT, ALKPHOS, BILITOT, PROT, ALBUMIN in the last 168 hours.  Urine analysis:    Component Value Date/Time   COLORURINE YELLOW 07/31/2019 1245   APPEARANCEUR CLEAR 07/31/2019 1245   LABSPEC 1.038 (H) 07/31/2019 1245   PHURINE 6.0 07/31/2019 1245   GLUCOSEU >=500 (A) 07/31/2019 1245   HGBUR NEGATIVE 07/31/2019 1245   BILIRUBINUR NEGATIVE 07/31/2019 1245   KETONESUR 20 (A) 07/31/2019 1245   PROTEINUR NEGATIVE 07/31/2019 1245   NITRITE NEGATIVE 07/31/2019 1245   LEUKOCYTESUR NEGATIVE 07/31/2019 1245    Radiological Exams on Admission: CT Head Wo Contrast  Result Date: 07/31/2019 CLINICAL DATA:  Dizziness EXAM: CT HEAD WITHOUT CONTRAST TECHNIQUE: Contiguous axial images were obtained from the base of the skull through the vertex without intravenous contrast. COMPARISON:  None. FINDINGS: Brain: No evidence of acute infarction, hemorrhage, hydrocephalus, extra-axial collection or mass lesion/mass effect. Cavum septum pellucidum. Mild cerebral volume loss. Vascular: Atherosclerotic calcifications involving the large vessels of the skull base. No unexpected hyperdense vessel. Skull: Normal. Negative for fracture or focal lesion. Sinuses/Orbits: No acute finding. Other: None. IMPRESSION: 1. No acute intracranial findings. 2. Mild age related cerebral volume loss. Electronically Signed   By: Davina Poke D.O.   On: 07/31/2019 14:11   MR BRAIN WO CONTRAST  Result Date: 07/31/2019 CLINICAL DATA:  Worsening dizziness EXAM: MRI HEAD WITHOUT CONTRAST TECHNIQUE: Multiplanar, multiecho  pulse sequences of the brain and surrounding structures were obtained without intravenous contrast. COMPARISON:  None. FINDINGS: Brain: Small subcentimeter foci of restricted diffusion are present within the right cerebellar hemisphere. There is no evidence of intracranial hemorrhage. Patchy small foci of T2 hyperintensity in the supratentorial white matter are nonspecific but may reflect minor chronic microvascular ischemic changes. There is no intracranial mass, mass effect, or edema. There is no hydrocephalus or extra-axial fluid collection. Ventricles and sulci are within normal limits in size and configuration, noting presence of a cavum septum pellucidum et vergae. Vascular: Major vessel flow voids at the skull base are preserved. Skull and upper cervical spine: Normal marrow signal is preserved. Sinuses/Orbits: Minor mucosal thickening.  Orbits are unremarkable. Other: Sella is unremarkable.  Mastoid air cells are clear. IMPRESSION: Multiple small acute right cerebellar infarcts. Minor chronic microvascular ischemic changes. Electronically Signed   By: Macy Mis M.D.   On: 07/31/2019 16:51   EKG: Independently reviewed.  Vent. rate 77 BPM PR interval 160 ms QRS duration 80 ms QT/QTc 378/427 ms P-R-T axes 63 7 59 Normal sinus rhythm Cannot rule out Anterior infarct , age undetermined Abnormal ECG (Normal coronary arteries on 01/05/2017 cardiac cath)  Assessment/Plan Principal Problem:   Acute cerebrovascular accident of cerebellum ([place]) Admit to telemetry/inpatient. Frequent neuro checks. Check swallow screen. PT/OT/SLP. Check fasting lipids. Check hemoglobin A1c. Check carotid Doppler. Check echocardiogram. CTA head/neck. Start aspirin. Neuro thinks that she may need DAPT.  Active Problems:   Essential hypertension Allow permissive hypertension. Resume antihypertensives per neurology.    Hyperlipidemia Check fasting lipids in a.m. Start atorvastatin 80 mg p.o. daily per  neuro.    DM (diabetes mellitus), type 2 with complications (HCC) Carbohydrate modified diet. Continue Lantus 15 units nightly in the morning. Continue Victoza in the evenings. CBG monitoring with RI SS. Check hemoglobin A1c.    Morbid obesity ([place]) Needs lifestyle modifications.    GERD (gastroesophageal reflux disease) Famotidine 20 mg p.o. twice daily.    Leukocytosis No fever, no chills or infectious process. Likely stress-induced. Follow WBC.    DVT prophylaxis: SCDs. Code Status:   Full code. Family Communication: Disposition Plan:   Patient is from:  Home.  Anticipated DC to:  Home.  Anticipated DC date:  08/02/2019.  Anticipated DC barriers: Work-up and clinical improvement.  Consults called:  Neuro hospitalist team. Admission status:  Inpatient/telemetry.  Severity of Illness:  Moderate severity.  Reubin Milan MD [facility]  How to contact the [facility] Attending or Consulting provider [place] or covering provider during after hours [place], for this patient?   1. Check the care team in [facility] and look for a) attending/consulting TR[facility] provider listed and b) the [facility] team listed 2.  Log into [email or web address] and use [place]'s universal password to access. If you do not have the password, please contact the hospital operator. 3. Locate the [facility] provider you are looking for under [facility] and page to a number that you can be directly reached. 4. If you still have difficulty reaching the provider, please page the [facility] (Director on Call) for the Hospitalists listed on amion for assistance.  07/31/2019, 10:28 PM   This document was prepared using Dragon voice recognition software and may contain some unintended transcription errors.

## 2019-07-31 NOTE — ED Notes (Signed)
Helped patient to the bathroom patient did well patient is now back in bed on the monitor call bell in reach and family at bedside

## 2019-07-31 NOTE — ED Triage Notes (Signed)
Pt states that she has been dizzy off and on for the past few months with dizziness being worse the past few days, went to pcp and BP was elevated into the 190s so she was sent here for further eval. Pt a/ox4, speech clear, face symmetrical, grip strength equal.

## 2019-07-31 NOTE — ED Notes (Signed)
Attempted IV stick twice without success, another RN to try

## 2019-07-31 NOTE — ED Provider Notes (Signed)
Patient received in sign out from K. Abrizze, PA-C. Patient presents for evaluation of dizziness x 2 weeks, described as "off-balance". No acute findings on CT. MRI pending. Anticipate discharge home if negative.  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 Discussed with neurology ([place])--will see in consultation. Admit to medicine.  Results for orders placed or performed during the hospital encounter of 0000000  Basic metabolic panel  Result Value Ref Range   Sodium 135 135 - 145 mmol/L   Potassium 4.2 3.5 - 5.1 mmol/L   Chloride 99 98 - 111 mmol/L   CO2 22 22 - 32 mmol/L   Glucose, Bld 392 (H) 70 - 99 mg/dL   BUN 15 8 - 23 mg/dL   Creatinine, Ser 0.66 0.44 - 1.00 mg/dL   Calcium 9.3 8.9 - 10.3 mg/dL   GFR calc non Af Amer >60 >60 mL/min   GFR calc Af Amer >60 >60 mL/min   Anion gap 14 5 - 15  CBC  Result Value Ref Range   WBC 12.3 (H) 4.0 - 10.5 K/uL   RBC 4.65 3.87 - 5.11 MIL/uL   Hemoglobin 14.5 12.0 - 15.0 g/dL   HCT 44.7 36.0 - 46.0 %   MCV 96.1 80.0 - 100.0 fL   MCH 31.2 26.0 - 34.0 pg   MCHC 32.4 30.0 - 36.0 g/dL   RDW 12.0 11.5 - 15.5 %   Platelets 291 150 - 400 K/uL   nRBC 0.0 0.0 - 0.2 %  Urinalysis, Routine w reflex microscopic  Result Value Ref Range   Color, Urine YELLOW YELLOW   APPearance CLEAR CLEAR   Specific Gravity, Urine 1.038 (H) 1.005 - 1.030   pH 6.0 5.0 - 8.0   Glucose, UA >=500 (A) NEGATIVE mg/dL   Hgb urine dipstick NEGATIVE NEGATIVE   Bilirubin Urine NEGATIVE NEGATIVE   Ketones, ur 20 (A) NEGATIVE mg/dL   Protein, ur NEGATIVE NEGATIVE mg/dL   Nitrite NEGATIVE NEGATIVE   Leukocytes,Ua NEGATIVE NEGATIVE   RBC / HPF 0-5 0 - 5 RBC/hpf   WBC, UA 0-5 0 - 5 WBC/hpf   Bacteria, UA NONE SEEN NONE SEEN   Squamous Epithelial / LPF 0-5 0 - 5  Glucose, capillary  Result Value Ref Range   Glucose-Capillary 233 (H) 70 - 99 mg/dL  CBG monitoring, ED  Result Value Ref Range   Glucose-Capillary 375 (H) 70 - 99 mg/dL   CT Head Wo  Contrast  Result Date: 07/31/2019 CLINICAL DATA:  Dizziness EXAM: CT HEAD WITHOUT CONTRAST TECHNIQUE: Contiguous axial images were obtained from the base of the skull through the vertex without intravenous contrast. COMPARISON:  None. FINDINGS: Brain: No evidence of acute infarction, hemorrhage, hydrocephalus, extra-axial collection or mass lesion/mass effect. Cavum septum pellucidum. Mild cerebral volume loss. Vascular: Atherosclerotic calcifications involving the large vessels of the skull base. No unexpected hyperdense vessel. Skull: Normal. Negative for fracture or focal lesion. Sinuses/Orbits: No acute finding. Other: None. IMPRESSION: 1. No acute intracranial findings. 2. Mild age related cerebral volume loss. Electronically Signed   By: Davina Poke D.O.   On: 07/31/2019 14:11   MR BRAIN WO CONTRAST  Result Date: 07/31/2019 CLINICAL DATA:  Worsening dizziness EXAM: MRI HEAD WITHOUT CONTRAST TECHNIQUE: Multiplanar, multiecho pulse sequences of the brain and surrounding structures were obtained without intravenous contrast. COMPARISON:  None. FINDINGS: Brain: Small subcentimeter foci of restricted diffusion are present within the right cerebellar hemisphere. There is no evidence of intracranial hemorrhage. Patchy small foci of T2 hyperintensity in the  supratentorial white matter are nonspecific but may reflect minor chronic microvascular ischemic changes. There is no intracranial mass, mass effect, or edema. There is no hydrocephalus or extra-axial fluid collection. Ventricles and sulci are within normal limits in size and configuration, noting presence of a cavum septum pellucidum et vergae. Vascular: Major vessel flow voids at the skull base are preserved. Skull and upper cervical spine: Normal marrow signal is preserved. Sinuses/Orbits: Minor mucosal thickening.  Orbits are unremarkable. Other: Sella is unremarkable.  Mastoid air cells are clear. IMPRESSION: Multiple small acute right cerebellar  infarcts. Minor chronic microvascular ischemic changes. Electronically Signed   By: Macy Mis M.D.   On: 07/31/2019 16:51     Etta Quill, NP 07/31/19 2324    Tegeler, Gwenyth Allegra, MD 08/01/19 [phone]

## 2019-07-31 NOTE — ED Provider Notes (Signed)
[facility] EMERGENCY DEPARTMENT Provider Note   CSN: [id number] Arrival date & time: 07/31/19  1100     History Chief Complaint  Patient presents with  . Dizziness    [name] is a 74 y.o. female with past medical history significant for anxiety, type 2 diabetes, hypertension, hyperlipidemia, GERD presents to emergency department today with chief complaint of progressively worsening dizziness x 2 days. Patient states the intermittent dizziness actually started x 2 weeks ago. She describes it as feeling off balance as if she is going to fall. She reports the dizziness is worse when changing positions. She has history of similar sensation in the distant past but states it never lasted more than a day. She has not had evaluation of dizziness in the past. She went to her pcp prior to arrival and was found to have systolic pressure of [id number]. She was sent to the emergency department for further evaluation. She did not take any medications for her symptoms prior to arrival. She denies fever, chills, headache, neck pain, weakness, numbness, double vision, syncope, palpitations, abdominal pain, nausea, vomiting, urinary symptoms, diarrhea.   Past Medical History:  Diagnosis Date  . Actinic keratosis    Followed by Dr. Allyson Sabal  . Anxiety   . Diabetes mellitus, type II, insulin dependent ([place])    Associated with obesity: She takes Lantus, Victoza & Actos  . Essential hypertension   . GERD (gastroesophageal reflux disease)   . Hyperlipidemia   . Morbid obesity with BMI of 40.0-44.9, adult [facility])     Patient Active Problem List   Diagnosis Date Noted  . Morbid obesity ([place]) 01/25/2018  . Chest pain with low risk for cardiac etiology 12/31/2016  . Essential hypertension 12/31/2016  . Hyperlipidemia with target low density lipoprotein (LDL) cholesterol less than 100 mg/dL 12/31/2016  . DM (diabetes mellitus), type 2 with complications ([place]) [id number]    Past Surgical  History:  Procedure Laterality Date  . BREAST BIOPSY  1997  . LEFT HEART CATH AND CORONARY ANGIOGRAPHY N/A 01/05/2017   Procedure: LEFT HEART CATH AND CORONARY ANGIOGRAPHY;  Surgeon: Leonie Man, MD;  Location: [facility];  Service: Cardiovascular: Angiographically normal coronary arteries with a right dominant system.  Normal EF 55-65%.     OB History   No obstetric history on file.     Family History  Problem Relation Age of Onset  . Diabetes Mother   . Hyperlipidemia Mother   . Stroke Mother 43  . Liver cancer Father   . Hypertension Sister   . Hyperlipidemia Sister   . Diabetes Sister        On insulin  . Depression Sister   . Kidney disease Brother        After dialysis he had renal transplant  . Diabetes Brother   . Coronary artery disease Brother   . Peripheral Artery Disease Brother   . Stroke Brother 26  . Bladder Cancer Brother   . Diabetes Brother     Social History   Tobacco Use  . Smoking status: Never Smoker  . Smokeless tobacco: Never Used  Substance Use Topics  . Alcohol use: No  . Drug use: No    Home Medications Prior to Admission medications   Medication Sig Start Date End Date Taking? Authorizing Provider  LANTUS SOLOSTAR 100 UNIT/ML Solostar Pen Inject 50 Units into the skin daily. Before breakfast 12/17/16  Yes [provider]  liraglutide (VICTOZA) 18 MG/3ML SOPN Inject 1.2  mg into the skin every evening.   Yes [provider]  Naproxen Sod-Diphenhydramine (ALEVE PM) 220-25 MG TABS Take 2 tablets by mouth at bedtime as needed (sleep).   Yes [provider]  carvedilol (COREG) 3.125 MG tablet Take 1 tablet (3.125 mg total) by mouth 2 (two) times daily. 12/31/16 02/12/19  [place]ie Man, MD  lisinopril-hydrochlorothiazide (PRINZIDE,ZESTORETIC) 20-12.5 MG tablet Take 1 tablet by mouth daily. 12/27/16   [provider]  nitroGLYCERIN (NITROSTAT) 0.4 MG SL tablet Place 1 tablet (0.4 mg total) under  the tongue every 5 (five) minutes as needed for chest pain. 12/31/16 02/12/19  [place]ie Man, MD  pioglitazone (ACTOS) 30 MG tablet Take 30 mg by mouth every evening.  12/27/16   [provider]    Allergies    Livalo [pitavastatin] and Sulfa antibiotics  Review of Systems   Review of Systems  All other systems are reviewed and are negative for acute change except as noted in the HPI.   Physical Exam Updated Vital Signs BP (!) 168/68   Pulse 71   Temp 97.7 F (36.5 C) (Oral)   Resp 12   SpO2 97%   Physical Exam Vitals and nursing note reviewed.  Constitutional:      General: She is not in acute distress.    Appearance: She is not ill-appearing.  HENT:     Head: Normocephalic and atraumatic.     Right Ear: Tympanic membrane and external ear normal.     Left Ear: Tympanic membrane and external ear normal.     Nose: Nose normal.     Mouth/Throat:     Mouth: Mucous membranes are moist.     Pharynx: Oropharynx is clear.  Eyes:     General: No scleral icterus.       Right eye: No discharge.        Left eye: No discharge.     Extraocular Movements: Extraocular movements intact.     Conjunctiva/sclera: Conjunctivae normal.     Pupils: Pupils are equal, round, and reactive to light.     Comments: No nystagmus  Neck:     Vascular: No JVD.  Cardiovascular:     Rate and Rhythm: Normal rate and regular rhythm.     Pulses: Normal pulses.          Radial pulses are 2+ on the right side and 2+ on the left side.     Heart sounds: Normal heart sounds.  Pulmonary:     Comments: Lungs clear to auscultation in all fields. Symmetric chest rise. No wheezing, rales, or rhonchi. Abdominal:     Comments: Abdomen is soft, non-distended, and non-tender in all quadrants. No rigidity, no guarding. No peritoneal signs.  Musculoskeletal:        General: Normal range of motion.     Cervical back: Normal range of motion.  Skin:    General: Skin is warm and dry.     Capillary Refill:  Capillary refill takes less than 2 seconds.  Neurological:     Mental Status: She is oriented to person, place, and time.     GCS: GCS eye subscore is 4. GCS verbal subscore is 5. GCS motor subscore is 6.     Comments: Mental Status:  Alert, oriented, thought content appropriate, able to give a coherent history. Speech fluent without evidence of aphasia. Able to follow 2 step commands without difficulty.  Cranial Nerves:  II:  Peripheral visual fields grossly normal, pupils equal, round,  reactive to light III,IV, VI: ptosis not present, extra-ocular motions intact bilaterally  V,VII: smile symmetric, facial light touch sensation equal VIII: hearing grossly normal to voice  X: uvula elevates symmetrically  XI: bilateral shoulder shrug symmetric and strong XII: midline tongue extension without fassiculations Motor:  Normal tone. 5/5 in upper and lower extremities bilaterally including strong and equal grip strength and dorsiflexion/plantar flexion Sensory: Pinprick and light touch normal in all extremities.  Deep Tendon Reflexes: 2+ and symmetric in the biceps and patella Cerebellar: normal finger-to-nose with bilateral upper extremities Patient feels off balance when attempting to walk CV: distal pulses palpable throughout      Psychiatric:        Behavior: Behavior normal.     ED Results / Procedures / Treatments   Labs (all labs ordered are listed, but only abnormal results are displayed) Labs Reviewed  BASIC METABOLIC PANEL - Abnormal; Notable for the following components:      Result Value   Glucose, Bld 392 (*)    All other components within normal limits  CBC - Abnormal; Notable for the following components:   WBC 12.3 (*)    All other components within normal limits  URINALYSIS, ROUTINE W REFLEX MICROSCOPIC - Abnormal; Notable for the following components:   Specific Gravity, Urine 1.038 (*)    Glucose, UA >=500 (*)    Ketones, ur 20 (*)    All other components  within normal limits  CBG MONITORING, ED - Abnormal; Notable for the following components:   Glucose-Capillary 375 (*)    All other components within normal limits    EKG EKG Interpretation  Date/Time:  Tuesday Jul 31 2019 11:02:26 EDT Ventricular Rate:  77 PR Interval:  160 QRS Duration: 80 QT Interval:  378 QTC Calculation: 427 R Axis:   7 Text Interpretation: Normal sinus rhythm Cannot rule out Anterior infarct , age undetermined Abnormal ECG Confirmed by Gerlene Fee [phone]) on 07/31/2019 12:32:40 PM   Radiology CT Head Wo Contrast  Result Date: 07/31/2019 CLINICAL DATA:  Dizziness EXAM: CT HEAD WITHOUT CONTRAST TECHNIQUE: Contiguous axial images were obtained from the base of the skull through the vertex without intravenous contrast. COMPARISON:  None. FINDINGS: Brain: No evidence of acute infarction, hemorrhage, hydrocephalus, extra-axial collection or mass lesion/mass effect. Cavum septum pellucidum. Mild cerebral volume loss. Vascular: Atherosclerotic calcifications involving the large vessels of the skull base. No unexpected hyperdense vessel. Skull: Normal. Negative for fracture or focal lesion. Sinuses/Orbits: No acute finding. Other: None. IMPRESSION: 1. No acute intracranial findings. 2. Mild age related cerebral volume loss. Electronically Signed   By: Davina Poke D.O.   On: 07/31/2019 14:11    Procedures Procedures (including critical care time)  Medications Ordered in ED Medications  sodium chloride flush (NS) 0.9 % injection 3 mL (3 mLs Intravenous Given 07/31/19 1422)  sodium chloride 0.9 % bolus 1,000 mL (1,000 mLs Intravenous New Bag/Given 07/31/19 1418)  meclizine (ANTIVERT) tablet 12.5 mg (12.5 mg Oral Given 07/31/19 1500)    ED Course  I have reviewed the triage vital signs and the nursing notes.  Pertinent labs & imaging results that were available during my care of the patient were reviewed by me and considered in my medical decision making (see chart for  details).    MDM Rules/Calculators/A&P                      History provided by patient with additional history obtained from chart review.  Patient seen and examined. Patient presents awake, alert, hemodynamically stable, afebrile, non toxic. Attempted to ambulate patient and she is unable to ambulate because she feels unbalanced. Neuro exam is otherwise normal. Labs show leukocytosis 12.3, no anemia, no severe electrolyte derangements. UA without signs of infection. There is possible dehydration, IVF given. Head CT is negative for bleed or cva. Given her continued symptoms will proceed with MR brain. Patient also given 12.5 mg Antivert in attempt to control symptoms. The patient was discussed with and seen by Dr. Sedonia Small who agrees with the treatment plan.  Patient care transferred to D. Smith NP at the end of my shift pending MR brain. Patient presentation, ED course, and plan of care discussed with review of all pertinent labs and imaging. Please see his note for further details regarding further ED course and disposition. If MRI negative anticipate discharge home.   Portions of this note were generated with [employer]. Dictation errors may occur despite best attempts at proofreading.  Final Clinical Impression(s) / ED Diagnoses Final diagnoses:  Dizziness    Rx / DC Orders ED Discharge Orders    None       Cherre Robins, PA-C 07/31/19 1610    Maudie Flakes, MD 08/03/19 1529

## 2019-07-31 NOTE — ED Notes (Signed)
Pt transported to CT ?

## 2019-07-31 NOTE — ED Notes (Signed)
Got patient undress into a gown on the monitor patient is resting with call bell in reach and family at bedside

## 2019-07-31 NOTE — ED Notes (Signed)
Pt in MRI.

## 2019-07-31 NOTE — Consult Note (Signed)
Neurology Consultation  Reason for Consult: Dizziness, stroke Referring Physician: Dr. Sherry Ruffing, EDP  CC: Dizziness  History is obtained from: Patient, chart  HPI: [name] is a 74 y.o. female past medical history of diabetes, obesity, hypertension, hyperlipidemia, off-and-on dizziness for many months to years presenting to the emergency room for evaluation of acute episode of dizziness that has not resolved. She reports that she was last known normal Saturday night Jul 28, 2019 when she went to bed and woke up the next morning with some dizziness and gait instability. She felt like she was swerving to the right and then swerving to the left and attempting to walk. She also reported as if she was on a boat and was feeling unsteady on her feet. The symptoms did not resolve and that is why she came into the emergency room. An MRI of the brain was done that showed right cerebellar acute infarcts. Neurological consultation for further management Denies any visual changes.  Denies any clumsiness of the hands.  Denies any tingling.  Denies any focal weakness.  Denies any headaches.  Denies chest pain shortness of breath fevers chills nausea vomiting.  LKW: 2 to 3 days ago minimum. tpa given?: no, outside the window Premorbid modified Rankin scale (mRS):0  ROS: Review of systems performed and negative except for as noted in HPI  Past Medical History:  Diagnosis Date  . Actinic keratosis    Followed by Dr. Allyson Sabal  . Anxiety   . Diabetes mellitus, type II, insulin dependent ([place])    Associated with obesity: She takes Lantus, Victoza & Actos  . Essential hypertension   . GERD (gastroesophageal reflux disease)   . Hyperlipidemia   . Morbid obesity with BMI of 40.0-44.9, adult ([facility])     Family History  Problem Relation Age of Onset  . Diabetes Mother   . Hyperlipidemia Mother   . Stroke Mother 75  . Liver cancer Father   . Hypertension Sister   . Hyperlipidemia Sister   .  Diabetes Sister        On insulin  . Depression Sister   . Kidney disease Brother        After dialysis he had renal transplant  . Diabetes Brother   . Coronary artery disease Brother   . Peripheral Artery Disease Brother   . Stroke Brother 23  . Bladder Cancer Brother   . Diabetes Brother     Social History:   reports that she has never smoked. She has never used smokeless tobacco. She reports that she does not drink alcohol or use drugs.  Medications No current facility-administered medications for this encounter.  Current Outpatient Medications:  .  LANTUS SOLOSTAR 100 UNIT/ML Solostar Pen, Inject 50 Units into the skin daily. Before breakfast, Disp: , Rfl:  .  liraglutide (VICTOZA) 18 MG/3ML SOPN, Inject 1.2 mg into the skin every evening., Disp: , Rfl:  .  Naproxen Sod-Diphenhydramine (ALEVE PM) 220-25 MG TABS, Take 2 tablets by mouth at bedtime as needed (sleep)., Disp: , Rfl:  .  carvedilol (COREG) 3.125 MG tablet, Take 1 tablet (3.125 mg total) by mouth 2 (two) times daily., Disp: 180 tablet, Rfl: 3 .  lisinopril-hydrochlorothiazide (PRINZIDE,ZESTORETIC) 20-12.5 MG tablet, Take 1 tablet by mouth daily., Disp: , Rfl:  .  nitroGLYCERIN (NITROSTAT) 0.4 MG SL tablet, Place 1 tablet (0.4 mg total) under the tongue every 5 (five) minutes as needed for chest pain., Disp: 25 tablet, Rfl: 5 .  pioglitazone (ACTOS) 30 MG tablet,  Take 30 mg by mouth every evening. , Disp: , Rfl:   Exam: Current vital signs: BP (!) 150/58   Pulse 65   Temp 97.7 F (36.5 C) (Oral)   Resp (!) 25   SpO2 98%  Vital signs in last 24 hours: Temp:  [97.7 F (36.5 C)] 97.7 F (36.5 C) (05/04 1103) Pulse Rate:  [65-79] 65 (05/04 1800) Resp:  [12-25] 25 (05/04 1800) BP: (150-168)/(58-84) 150/58 (05/04 1800) SpO2:  [94 %-99 %] 98 % (05/04 1800) GENERAL: Awake, alert in NAD HEENT: - Normocephalic and atraumatic, dry mm, no LN++, no Thyromegally LUNGS - Clear to auscultation bilaterally with no  wheezes CV - S1S2 RRR, no m/r/g, equal pulses bilaterally. ABDOMEN - Soft, nontender, nondistended with normoactive BS Ext: warm, well perfused, intact peripheral pulses, is pedal edema bilaterally, chronic venous stasis changes seen. NEURO:  Mental Status: AA&Ox3  Language: speech is clear with no evidence of aphasia or dysarthria.  Naming, repetition, fluency, and comprehension intact. Cranial Nerves: PERRLmm/brisk. EOMI, visual fields full, no facial asymmetry, facial sensation intact, hearing intact, tongue/uvula/soft palate midline, normal sternocleidomastoid and trapezius muscle strength. No evidence of tongue atrophy or fibrillations Motor: No drift in any of the 4 extremities on raising them against gravity. Tone: is normal and bulk is normal Sensation- Intact to light touch bilaterally with no extinction Coordination: FTN intact bilaterally, no ataxia in BLE. Gait- deferred  NIHSS-0   Labs I have reviewed labs in epic and the results pertinent to this consultation are:  CBC    Component Value Date/Time   WBC 12.3 (H) 07/31/2019 1113   RBC 4.65 07/31/2019 1113   HGB 14.5 07/31/2019 1113   HGB 13.0 12/31/2016 1410   HCT 44.7 07/31/2019 1113   HCT 39.4 12/31/2016 1410   PLT 291 07/31/2019 1113   PLT 281 12/31/2016 1410   MCV 96.1 07/31/2019 1113   MCV 93 12/31/2016 1410   MCH 31.2 07/31/2019 1113   MCHC 32.4 07/31/2019 1113   RDW 12.0 07/31/2019 1113   RDW 14.0 12/31/2016 1410    CMP     Component Value Date/Time   NA 135 07/31/2019 1113   NA 142 12/31/2016 1410   K 4.2 07/31/2019 1113   CL 99 07/31/2019 1113   CO2 22 07/31/2019 1113   GLUCOSE 392 (H) 07/31/2019 1113   BUN 15 07/31/2019 1113   BUN 24 12/31/2016 1410   CREATININE 0.66 07/31/2019 1113   CALCIUM 9.3 07/31/2019 1113   PROT 6.3 05/22/2018 1013   ALBUMIN 4.0 05/22/2018 1013   AST 11 05/22/2018 1013   ALT 19 05/22/2018 1013   ALKPHOS 92 05/22/2018 1013   BILITOT 0.5 05/22/2018 1013   GFRNONAA  >60 07/31/2019 1113   GFRAA >60 07/31/2019 1113    Lipid Panel     Component Value Date/Time   CHOL 161 05/22/2018 1013   TRIG 112 05/22/2018 1013   HDL 48 05/22/2018 1013   CHOLHDL 3.4 05/22/2018 1013   LDLCALC 91 05/22/2018 1013     Imaging I have reviewed the images obtained: MRI examination of the brain shows multiple acute right cerebellar infarcts.  Assessment: 74 year old woman with past medical history as above with 3 to 4 days worth of dizziness and gait instability found to have multiple acute right cerebellar infarct. No mass-effect or edema. Likely atheroembolic versus cardioembolic versus small vessel disease. Needs further work-up.  Impression: Acute ischemic stroke-etiology under investigation  Recommendations: Admit to hospitalist Frequent neurochecks Telemetry CTA head and  neck 2D echo A1c Lipid panel Allow for permissive hypertension for next 24 to 48 hours, treating only if systolic blood pressures are greater than 220 on a as needed basis. Prophylactic medication: Aspirin 325 for now.  Based on vascular imaging, might need dual antiplatelets. Statin-high intensity statin-atorvastatin 80 mg now and daily. PT OT Speech therapy Bedside swallow eval-n.p.o. until clears the bedside swallow evaluation. Counseled on the importance of regular exercise and weight loss along with carb modified heart healthy diet.  Stroke team will follow with you. -- Amie Portland, MD [facility] Pager: [phone] If 7pm to 7am, please call on call as listed on AMION.'

## 2019-07-31 NOTE — Discharge Instructions (Addendum)
Wound care instructions Keep incision clean and dry for 3 days. You can remove outer dressing tomorrow. Leave steri-strips (little pieces of tape) on until seen in the office for wound check appointment. Call the office ([phone]) for redness, drainage, swelling, or fever.  

## 2019-08-01 ENCOUNTER — Inpatient Hospital Stay (HOSPITAL_COMMUNITY): Payer: Medicare Other

## 2019-08-01 ENCOUNTER — Encounter (HOSPITAL_COMMUNITY)
Admission: EM | Disposition: A | Discharge status: Home / Self Care | Payer: Self-pay | Source: Ambulatory Visit | Attending: Internal Medicine

## 2019-08-01 ENCOUNTER — Encounter (HOSPITAL_COMMUNITY): Payer: Self-pay | Admitting: Internal Medicine

## 2019-08-01 DIAGNOSIS — I6389 Other cerebral infarction: Secondary | ICD-10-CM

## 2019-08-01 DIAGNOSIS — I639 Cerebral infarction, unspecified: Secondary | ICD-10-CM

## 2019-08-01 DIAGNOSIS — I10 Essential (primary) hypertension: Secondary | ICD-10-CM

## 2019-08-01 DIAGNOSIS — I361 Nonrheumatic tricuspid (valve) insufficiency: Secondary | ICD-10-CM | POA: Diagnosis not present

## 2019-08-01 HISTORY — PX: LOOP RECORDER INSERTION: EP1214

## 2019-08-01 LAB — LIPID PANEL
Cholesterol: 193 mg/dL (ref 0–200)
HDL: 56 mg/dL (ref >40)
LDL Cholesterol: 118 mg/dL — ABNORMAL HIGH (ref 0–99)
Total CHOL/HDL Ratio: 3.4 RATIO
Triglycerides: 97 mg/dL (ref <150)
VLDL: 19 mg/dL (ref 0–40)

## 2019-08-01 LAB — COMPREHENSIVE METABOLIC PANEL
ALT: 19 U/L (ref 0–44)
AST: 17 U/L (ref 15–41)
Albumin: 2.9 g/dL — ABNORMAL LOW (ref 3.5–5.0)
Alkaline Phosphatase: 83 U/L (ref 38–126)
Anion gap: 11 (ref 5–15)
BUN: 17 mg/dL (ref 8–23)
CO2: 24 mmol/L (ref 22–32)
Calcium: 9 mg/dL (ref 8.9–10.3)
Chloride: 104 mmol/L (ref 98–111)
Creatinine, Ser: 0.59 mg/dL (ref 0.44–1.00)
GFR calc Af Amer: >60 mL/min (ref >60)
GFR calc non Af Amer: >60 mL/min (ref >60)
Glucose, Bld: 207 mg/dL — ABNORMAL HIGH (ref 70–99)
Potassium: 4.1 mmol/L (ref 3.5–5.1)
Sodium: 139 mmol/L (ref 135–145)
Total Bilirubin: 0.9 mg/dL (ref 0.3–1.2)
Total Protein: 5.7 g/dL — ABNORMAL LOW (ref 6.5–8.1)

## 2019-08-01 LAB — GLUCOSE, CAPILLARY
Glucose-Capillary: 171 mg/dL — ABNORMAL HIGH (ref 70–99)
Glucose-Capillary: 183 mg/dL — ABNORMAL HIGH (ref 70–99)
Glucose-Capillary: 239 mg/dL — ABNORMAL HIGH (ref 70–99)
Glucose-Capillary: 221 mg/dL — ABNORMAL HIGH (ref 70–99)

## 2019-08-01 LAB — CBC
HCT: 40.2 % (ref 36.0–46.0)
Hemoglobin: 13.4 g/dL (ref 12.0–15.0)
MCH: 31.9 pg (ref 26.0–34.0)
MCHC: 33.3 g/dL (ref 30.0–36.0)
MCV: 95.7 fL (ref 80.0–100.0)
Platelets: 264 10*3/uL (ref 150–400)
RBC: 4.2 MIL/uL (ref 3.87–5.11)
RDW: 12.3 % (ref 11.5–15.5)
WBC: 9.4 10*3/uL (ref 4.0–10.5)
nRBC: 0 % (ref 0.0–0.2)

## 2019-08-01 LAB — ECHOCARDIOGRAM COMPLETE

## 2019-08-01 LAB — HEMOGLOBIN A1C
Hgb A1c MFr Bld: 10.4 % — ABNORMAL HIGH (ref 4.8–5.6)
Mean Plasma Glucose: 251.78 mg/dL

## 2019-08-01 LAB — SARS CORONAVIRUS 2 (TAT 6-24 HRS): SARS Coronavirus 2: NEGATIVE

## 2019-08-01 SURGERY — LOOP RECORDER INSERTION

## 2019-08-01 MED ORDER — ATORVASTATIN CALCIUM 40 MG PO TABS
40.0000 mg | ORAL_TABLET | Freq: Every day | ORAL | Status: DC
  Administered 2019-08-01 – 2019-08-02 (×2): 40 mg via ORAL
  Filled 2019-08-01 (×2): qty 1

## 2019-08-01 MED ORDER — IOHEXOL 350 MG/ML SOLN
75.0000 mL | Freq: Once | INTRAVENOUS | Status: AC | PRN
  Administered 2019-08-01: 75 mL via INTRAVENOUS

## 2019-08-01 MED ORDER — CLOPIDOGREL BISULFATE 75 MG PO TABS
75.0000 mg | ORAL_TABLET | Freq: Every day | ORAL | Status: DC
  Administered 2019-08-01 – 2019-08-02 (×2): 75 mg via ORAL
  Filled 2019-08-01 (×2): qty 1

## 2019-08-01 MED ORDER — ASPIRIN EC 81 MG PO TBEC
81.0000 mg | DELAYED_RELEASE_TABLET | Freq: Every day | ORAL | Status: DC
  Administered 2019-08-01 – 2019-08-02 (×2): 81 mg via ORAL
  Filled 2019-08-01 (×2): qty 1

## 2019-08-01 MED ORDER — LIDOCAINE-EPINEPHRINE 1 %-1:100000 IJ SOLN
INTRAMUSCULAR | Status: AC
  Filled 2019-08-01: qty 1

## 2019-08-01 MED ORDER — LIDOCAINE-EPINEPHRINE 1 %-1:100000 IJ SOLN
INTRAMUSCULAR | Status: DC | PRN
  Administered 2019-08-01: 30 mL

## 2019-08-01 SURGICAL SUPPLY — 2 items
MONITOR REVEAL LINQ II (Prosthesis & Implant Heart) ×2 IMPLANT
PACK LOOP INSERTION (CUSTOM PROCEDURE TRAY) ×3 IMPLANT

## 2019-08-01 NOTE — Evaluation (Signed)
Speech Language Pathology Evaluation Patient Details Name: Miria Finnie MRN: [id number] DOB: 1945/05/04 Today's Date: 08/01/2019 Time: [id number] SLP Time Calculation (min) (ACUTE ONLY): 28 min  Problem List:  Patient Active Problem List   Diagnosis Date Noted  . Acute cerebrovascular accident of cerebellum ([place]) 07/31/2019  . GERD (gastroesophageal reflux disease)   . Leukocytosis   . Morbid obesity ([place]) 01/25/2018  . Chest pain with low risk for cardiac etiology 12/31/2016  . Essential hypertension 12/31/2016  . Hyperlipidemia 12/31/2016  . DM (diabetes mellitus), type 2 with complications ([place]) 123XX123   Past Medical History:  Past Medical History:  Diagnosis Date  . Actinic keratosis    Followed by Dr. Allyson Sabal  . Anxiety   . Diabetes mellitus, type II, insulin dependent ([place])    Associated with obesity: She takes Lantus, Victoza & Actos  . Essential hypertension   . GERD (gastroesophageal reflux disease)   . Hyperlipidemia   . Morbid obesity with BMI of 40.0-44.9, adult [facility])    Past Surgical History:  Past Surgical History:  Procedure Laterality Date  . BREAST BIOPSY  1997  . LEFT HEART CATH AND CORONARY ANGIOGRAPHY N/A 01/05/2017   Procedure: LEFT HEART CATH AND CORONARY ANGIOGRAPHY;  Surgeon: Leonie Man, MD;  Location: [place] [facility];  Service: Cardiovascular: Angiographically normal coronary arteries with a right dominant system.  Normal EF 55-65%.   HPI:  Genovia Moldenhauer is a 74 y.o. female with medical history significant of actinic keratosis, type 2 diabetes, essential hypertension, GERD, hyperlipidemia, morbid obesity who is coming to the emergency department due to on and off dizziness for the past few months, but this has been particularly very intense over the last 2 days. CT revealed R cerebellar infarct.   Assessment / Plan / Recommendation Clinical Impression  Mrs. Veazie was seen for a cognitive-linguistic evaluation, which  revealed cognitive functions WNL for age and education. Pt was assessed with the [facility] Mental Status Examination, with a total score of 27/30, falling WNL. Errors noted were with calculations and paragraph memory recall. Pt was noted with good insight and self-monitoring. She i'ly corrected her clock drawing prior to handing it back to SLP after she had switched the hands. Pt/caregiver deny any changes in cognition or speech/language abilities. She also denies any swallowing difficulty and she previously passed the [employer] Protocol 3 oz challenge. Based on today's findings, no further ST indicated.    SLP Assessment  SLP Recommendation/Assessment: Patient does not need any further Speech Lanaguage Pathology Services SLP Visit Diagnosis: Cognitive communication deficit (R41.841)   SLUMS: Orientation: 3/3 Calculations: 2/3 Naming: 3/3 Attention: 2/2 Visuospatial Processing: 6/6 Paragraph Recall: 6/8 Total: 27/30; WNL   Follow Up Recommendations  None       SLP Evaluation Cognition  Overall Cognitive Status: Within Functional Limits for tasks assessed Arousal/Alertness: Awake/alert Orientation Level: Oriented X4 Attention: Focused Focused Attention: Appears intact Memory: Appears intact Awareness: Appears intact Problem Solving: Appears intact Executive Function: Reasoning;Sequencing;Decision Making;Initiating;Self Correcting;Self Monitoring Reasoning: Appears intact Sequencing: Appears intact Decision Making: Appears intact Initiating: Appears intact Self Monitoring: Appears intact Self Correcting: Appears intact Safety/Judgment: Appears intact [employer] Scales of Cognitive Functioning: Purposeful/appropriate       Expression Written Expression Dominant Hand: Right   Oral / Motor  Oral Motor/Sensory Function Overall Oral Motor/Sensory Function: Within functional limits Motor Speech Overall Motor Speech: Appears within functional limits for  tasks assessed Respiration: Within functional limits Phonation: Normal Resonance: Within functional limits Articulation: Within  functional limitis Intelligibility: Intelligible Motor Planning: Witnin functional limits    P. , M.S., CCC-SLP Speech-Language Pathologist Acute Rehabilitation Services Pager: [place] 08/01/2019, 10:23 AM

## 2019-08-01 NOTE — Progress Notes (Signed)
Inpatient Diabetes Program Recommendations  AACE/ADA: New Consensus Statement on Inpatient Glycemic Control (2015)  Target Ranges:  Prepandial:   less than 140 mg/dL      Peak postprandial:   less than 180 mg/dL (1-2 hours)      Critically ill patients:  140 - 180 mg/dL   Lab Results  Component Value Date   GLUCAP 183 (H) 08/01/2019   HGBA1C 10.4 (H) 08/01/2019    Review of Glycemic Control Results for [name], [name] (MRN [id number]) as of 08/01/2019 15:13  Ref. Range 07/31/2019 11:54 07/31/2019 22:36 08/01/2019 06:19 08/01/2019 12:19  Glucose-Capillary Latest Ref Range: 70 - 99 mg/dL 375 (H) 233 (H) 171 (H) 183 (H)   Diabetes history: DM 2 Outpatient Diabetes medications:  Lantus 50 units daily, Victoza 1.2 mg q PM, Actos 30 mg daily Current orders for Inpatient glycemic control:  Novolog 0-15 units tid with meals Inpatient Diabetes Program Recommendations:   Please consider adding Lantus 20 units daily.   Thanks,  Adah Perl, RN, BC-ADM Inpatient Diabetes Coordinator Pager [phone] (8a-5p)

## 2019-08-01 NOTE — Progress Notes (Signed)
  Echocardiogram 2D Echocardiogram has been performed.  [name] 08/01/2019, 11:06 AM

## 2019-08-01 NOTE — Evaluation (Signed)
Occupational Therapy Evaluation Patient Details Name: [name] MRN: [id number] DOB: 08-06-1945 Today's Date: 08/01/2019    History of Present Illness [name] is a 74 y.o. female with medical history significant of actinic keratosis, type 2 diabetes, essential hypertension, GERD, hyperlipidemia, morbid obesity who is coming to the emergency department due to on and off dizziness for the past few months, but this has been particularly very intense over the last 2 days. CT revealed R cerebellar infarct.   Clinical Impression   This 74 y/o female presents with the above. PTA pt reports independence with ADL, iADL and functional mobility. Pt completing room level mobility without AD and overall close minguard assist level this session; pt with x1 LOB to her left requiring minA to correct. Pt requiring supervision for toileting and standing grooming ADL, minA for LB ADL. Pt reports she lives with family at home who can assist intermittently PRN. She will benefit from continued acute OT services to maximize her overall safety and independence with ADL and mobility; do not anticipate pt will require follow up OT services.     Follow Up Recommendations  No OT follow up;Supervision/Assistance - 24 hour(close to 24hr initially)    Equipment Recommendations  None recommended by OT(pt has seat she can use in shower)           Precautions / Restrictions Precautions Precautions: Fall Restrictions Weight Bearing Restrictions: No      Mobility Bed Mobility Overal bed mobility: Modified Independent             General bed mobility comments: HOB elevated (has sleep number at home) increased time, no use of bed rail, partly lAbored by body habitus  Transfers Overall transfer level: Needs assistance Equipment used: None Transfers: Sit to/from Stand Sit to Stand: Min guard         General transfer comment: min guard for safety due to noted dizziness, pt guarded but no  physical assist needed    Balance Overall balance assessment: Needs assistance Sitting-balance support: Feet unsupported;No upper extremity supported Sitting balance-Leahy Scale: Fair     Standing balance support: No upper extremity supported Standing balance-Leahy Scale: Fair Standing balance comment: pt with episode of LOB requiring minA                           ADL either performed or assessed with clinical judgement   ADL Overall ADL's : Needs assistance/impaired Eating/Feeding: Modified independent;Sitting   Grooming: Wash/dry hands;Supervision/safety;Standing   Upper Body Bathing: Set up;Supervision/ safety;Sitting   Lower Body Bathing: Supervison/ safety;Sitting/lateral leans;Sit to/from stand   Upper Body Dressing : Set up;Sitting   Lower Body Dressing: Minimal assistance;Sit to/from stand Lower Body Dressing Details (indicate cue type and reason): pt reports increased difficulty with socks this AM, difficulty this PM due to LUE iv site uncomfortable Toilet Transfer: Min guard;Minimal assistance;Ambulation Toilet Transfer Details (indicate cue type and reason): x1 LOB to the L with mobility to bathroom, minA to correct Toileting- Clothing Manipulation and Hygiene: Supervision/safety;Sitting/lateral lean;Sit to/from stand     Tub/Shower Transfer Details (indicate cue type and reason): discussed use of seat in her shower given ongoing dizziness and LOB, pt in agreement, she reports she has a chair at home she can use  Functional mobility during ADLs: Min guard;Minimal assistance       Vision Baseline Vision/History: Wears glasses Wears Glasses: At all times Patient Visual Report: Diplopia(intermittent diplopia) Vision Assessment?: Yes Eye Alignment: Within Functional Limits  Ocular Range of Motion: Within Functional Limits Alignment/Gaze Preference: Within Defined Limits Tracking/Visual Pursuits: Decreased smoothness of horizontal tracking Additional  Comments: x1 instance of beating nystagmus with horizontal tracking from R>L     Perception     Praxis      Pertinent Vitals/Pain Pain Assessment: Faces Faces Pain Scale: Hurts a little bit Pain Location: LUE (at iv site) Pain Descriptors / Indicators: Discomfort Pain Intervention(s): Monitored during session     Hand Dominance Right   Extremity/Trunk Assessment Upper Extremity Assessment Upper Extremity Assessment: Overall WFL for tasks assessed;LUE deficits/detail LUE Deficits / Details: mild weakness, grossly 4/5 compared to R, limited ROM due to IV site   Lower Extremity Assessment Lower Extremity Assessment: Defer to PT evaluation   Cervical / Trunk Assessment Cervical / Trunk Assessment: Normal   Communication Communication Communication: No difficulties   Cognition Arousal/Alertness: Awake/alert Behavior During Therapy: WFL for tasks assessed/performed Overall Cognitive Status: Within Functional Limits for tasks assessed                                     General Comments  HR in the 80s-90s with activity     Exercises     Shoulder Instructions      Home Living Family/patient expects to be discharged to:: Private residence Living Arrangements: Children(dtr and grandson) Available Help at Discharge: Family;Available 24 hours/day Type of Home: House Home Access: Stairs to enter [employer] of Steps: 3 Entrance Stairs-Rails: Right Home Layout: One level;Laundry or work area in basement     [employer]: Occupational psychologist: [place]: None          Prior Functioning/Environment Level of Independence: Independent        Comments: driving        OT Problem List: Decreased activity tolerance;Impaired balance (sitting and/or standing);Impaired vision/perception;Obesity      OT Treatment/Interventions: Self-care/ADL training;Therapeutic exercise;Energy conservation;DME and/or AE  instruction;Therapeutic activities;Cognitive remediation/compensation;Patient/family education;Balance training    OT Goals(Current goals can be found in the care plan section) Acute Rehab OT Goals Patient Stated Goal: home OT Goal Formulation: With patient Time For Goal Achievement: 08/15/19 Potential to Achieve Goals: Good  OT Frequency: Min 2X/week   Barriers to D/C:            Co-evaluation              AM-PAC OT "6 Clicks" Daily Activity     Outcome Measure Help from another person eating meals?: None Help from another person taking care of personal grooming?: A Little Help from another person toileting, which includes using toliet, bedpan, or urinal?: A Little Help from another person bathing (including washing, rinsing, drying)?: A Little Help from another person to put on and taking off regular upper body clothing?: A Little Help from another person to put on and taking off regular lower body clothing?: A Little 6 Click Score: 19   End of Session Equipment Utilized During Treatment: Gait belt Nurse Communication: Mobility status  Activity Tolerance: Patient tolerated treatment well Patient left: in bed;with call bell/phone within reach;with bed alarm set  OT Visit Diagnosis: Unsteadiness on feet (R26.81);Dizziness and giddiness (R42)                Time: [id number] OT Time Calculation (min): 16 min Charges:  OT General Charges $OT Visit: 1 Visit OT Evaluation $OT Eval Moderate  Complexity: [place], OT Acute Rehabilitation Services Pager [phone] Office [phone]   Raymondo Band 08/01/2019, 3:58 PM

## 2019-08-01 NOTE — TOC Initial Note (Signed)
Transition of Care [facility]) - Initial/Assessment Note    Patient Details  Name: [name] MRN: [id number] Date of Birth: 1945-05-26  Transition of Care [facility]) CM/SW Contact:    Pollie Friar, RN Phone Number: 08/01/2019, 11:32 AM  Clinical Narrative:                 PT recommending Outpatient therapy. Pt is in agreement and would like to attend at [facility]. Orders in Epic and information on the AVS.  Awaiting OT eval.  Currently no DME needs.  Pt states she will have transportation to rehab and can have supervision at home.  Pt states she has been noncompliant with her medications at home and plans on doing better in the future.  PCP: [facility] Pt has transportation home when medically ready.   Expected Discharge Plan: OP Rehab Barriers to Discharge: Continued Medical Work up   Patient Goals and CMS Choice     Choice offered to / list presented to : Patient  Expected Discharge Plan and Services Expected Discharge Plan: OP Rehab   Discharge Planning Services: CM Consult   Living arrangements for the past 2 months: Single Family Home                                      Prior Living Arrangements/Services Living arrangements for the past 2 months: Single Family Home Lives with:: Self Patient language and need for interpreter reviewed:: Yes Do you feel safe going back to the place where you live?: Yes        Care giver support system in place?: Yes (comment)(pt states she has family that can provided needed supervision)   Criminal Activity/Legal Involvement Pertinent to Current Situation/Hospitalization: No - Comment as needed  Activities of Daily Living Home Assistive Devices/Equipment: Bedside commode/3-in-1 ADL Screening (condition at time of admission) Patient's cognitive ability adequate to safely complete daily activities?: Yes Is the patient deaf or have difficulty hearing?: No Does the patient have difficulty seeing,  even when wearing glasses/contacts?: No Does the patient have difficulty concentrating, remembering, or making decisions?: No Patient able to express need for assistance with ADLs?: Yes Does the patient have difficulty dressing or bathing?: No Independently performs ADLs?: Yes (appropriate for developmental age) Does the patient have difficulty walking or climbing stairs?: No Weakness of Legs: Both Weakness of Arms/Hands: None  Permission Sought/Granted                  Emotional Assessment Appearance:: Appears stated age Attitude/Demeanor/Rapport: Engaged Affect (typically observed): Accepting Orientation: : Oriented to Self, Oriented to Place, Oriented to  Time, Oriented to Situation   Psych Involvement: No (comment)  Admission diagnosis:  Dizziness [R42] Cerebellar infarct [facility]) [I63.9] Acute cerebrovascular accident of cerebellum [facility]) [I63.9] Patient Active Problem List   Diagnosis Date Noted  . Acute cerebrovascular accident of cerebellum ([place]) 07/31/2019  . GERD (gastroesophageal reflux disease)   . Leukocytosis   . Morbid obesity ([place]) 01/25/2018  . Chest pain with low risk for cardiac etiology 12/31/2016  . Essential hypertension 12/31/2016  . Hyperlipidemia 12/31/2016  . DM (diabetes mellitus), type 2 with complications ([place]) [id number]   PCP:  Orpah Melter, MD Pharmacy:   [place], [place] - [place] [address] [place] [address] [place] [address] Phone: [phone] Fax: [phone]     Social Determinants of Health (SDOH) Interventions  Readmission Risk Interventions No flowsheet data found.

## 2019-08-01 NOTE — Plan of Care (Signed)
  Problem: Education: Goal: Knowledge of disease or condition will improve Outcome: Progressing Goal: Knowledge of secondary prevention will improve Outcome: Progressing   Problem: Coping: Goal: Will verbalize positive feelings about self Outcome: Progressing   Problem: Self-Care: Goal: Ability to communicate needs accurately will improve Outcome: Progressing

## 2019-08-01 NOTE — Consult Note (Addendum)
ELECTROPHYSIOLOGY CONSULT NOTE  Patient ID: [name] MRN: [id number], DOB/AGE: [birth year]   Admit date: 07/31/2019 Date of Consult: 08/01/2019  Primary Physician: Orpah Melter, MD Primary Cardiologist: Dr. Ellyn Hack Reason for Consultation: Cryptogenic stroke ; recommendations regarding Implantable Loop Recorder, requested by Dr. Erlinda Hong  History of Present Illness [name] was admitted on 07/31/2019 with recent dizziness with acute increase in the couple days prior    PMHx noted for actinic keratosis, type 2 diabetes, essential hypertension, GERD, hyperlipidemia, morbid obesity  Imaging demonstrated Multiple small acute right cerebellar infarcts. Minor chronic microvascular ischemic changes.  she has undergone workup for stroke including echocardiogram and carotid angio.  The patient has been monitored on telemetry which has demonstrated sinus rhythm with no arrhythmias.    Neurology has deferred TEE.  I have spoken with neurology NP, who confirms request/recommendation for loop implant.     Echocardiogram this admission demonstrated   IMPRESSIONS   1. Left ventricular ejection fraction, by estimation, is 60 to 65%. The  left ventricle has normal function. The left ventricle has no regional  wall motion abnormalities. Left ventricular diastolic parameters are  consistent with Grade I diastolic  dysfunction (impaired relaxation). Elevated left atrial pressure.   2. Right ventricular systolic function is normal. The right ventricular  size is normal. There is normal pulmonary artery systolic pressure. The  estimated right ventricular systolic pressure is [id number] mmHg.   3. The mitral valve is normal in structure. Trivial mitral valve  regurgitation. No evidence of mitral stenosis.   4. Tricuspid valve regurgitation is moderate.   5. The aortic valve is normal in structure. Aortic valve regurgitation is  not visualized. No aortic stenosis is present.   6. The inferior  vena cava is normal in size with greater than 50%  respiratory variability, suggesting right atrial pressure of 3 mmHg.   Conclusion(s)/Recommendation(s): No intracardiac source of embolism  detected on this transthoracic study. A transesophageal echocardiogram is  recommended to exclude cardiac source of embolism if clinically indicated.    Lab work is reviewed.    Prior to admission, the patient denies chest pain, shortness of breath, dizziness, palpitations, or syncope.  They are recovering from their stroke with plans to home at discharge.      Past Medical History:  Diagnosis Date   Actinic keratosis    Followed by Dr. Allyson Sabal   Anxiety    Diabetes mellitus, type II, insulin dependent ([place])    Associated with obesity: She takes Lantus, Victoza & Actos   Essential hypertension    GERD (gastroesophageal reflux disease)    Hyperlipidemia    Morbid obesity with BMI of 40.0-44.9, adult [facility])      Surgical History:  Past Surgical History:  Procedure Laterality Date   BREAST BIOPSY  1997   LEFT HEART CATH AND CORONARY ANGIOGRAPHY N/A 01/05/2017   Procedure: LEFT HEART CATH AND CORONARY ANGIOGRAPHY;  Surgeon: Leonie Man, MD;  Location: [facility];  Service: Cardiovascular: Angiographically normal coronary arteries with a right dominant system.  Normal EF 55-65%.     Medications Prior to Admission  Medication Sig Dispense Refill Last Dose   LANTUS SOLOSTAR 100 UNIT/ML Solostar Pen Inject 50 Units into the skin daily. Before breakfast   07/31/2019 at Unknown time   liraglutide (VICTOZA) 18 MG/3ML SOPN Inject 1.2 mg into the skin every evening.   07/30/2019 at Unknown time   Naproxen Sod-Diphenhydramine (ALEVE PM) 220-25 MG TABS Take 2 tablets by mouth at  bedtime as needed (sleep).   Past Month at Unknown time   carvedilol (COREG) 3.125 MG tablet Take 1 tablet (3.125 mg total) by mouth 2 (two) times daily. 180 tablet 3    lisinopril-hydrochlorothiazide  (PRINZIDE,ZESTORETIC) 20-12.5 MG tablet Take 1 tablet by mouth daily.   Not Taking at Unknown time   nitroGLYCERIN (NITROSTAT) 0.4 MG SL tablet Place 1 tablet (0.4 mg total) under the tongue every 5 (five) minutes as needed for chest pain. 25 tablet 5    pioglitazone (ACTOS) 30 MG tablet Take 30 mg by mouth every evening.    Not Taking at Unknown time    Inpatient Medications:   aspirin EC  81 mg Oral Daily   atorvastatin  40 mg Oral Daily   clopidogrel  75 mg Oral Daily   insulin aspart  0-15 Units Subcutaneous TID WC    Allergies:  Allergies  Allergen Reactions   Livalo [Pitavastatin]     MYALGIAS    Sulfa Antibiotics Itching    Itching and red spots.    Social History   Socioeconomic History   Marital status: Widowed    Spouse name: Not on file   Number of children: 3   Years of education: 11   Highest education level: Not on file  Occupational History    Comment: Retired  Tobacco Use   Smoking status: Never Smoker   Smokeless tobacco: Never Used  Substance and Sexual Activity   Alcohol use: No   Drug use: No   Sexual activity: Not Currently  Other Topics Concern   Not on file  Social History Narrative   She is a widowed mother of 3 with 5 grandchildren. She lives with one of her daughters and grandson. She is a coming by her sister. She tries to walk, but does not do it regularly.   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   Social Determinants of Health   Financial Resource Strain:    Difficulty of Paying Living Expenses:   Food Insecurity:    Worried About Charity fundraiser in the Last Year:    Arboriculturist in the Last Year:   Transportation Needs:    Film/video editor (Medical):    Lack of Transportation (Non-Medical):   Physical Activity:    Days of Exercise per Week:    Minutes of Exercise per Session:   Stress:    Feeling of Stress :   Social Connections:    Frequency of Communication with Friends and Family:    Frequency  of Social Gatherings with Friends and Family:    Attends Religious Services:    Active Member of Clubs or Organizations:    Attends Music therapist:    Marital Status:   Intimate Partner Violence:    Fear of Current or Ex-Partner:    Emotionally Abused:    Physically Abused:    Sexually Abused:      Family History  Problem Relation Age of Onset   Diabetes Mother    Hyperlipidemia Mother    Stroke Mother 27   Liver cancer Father    Hypertension Sister    Hyperlipidemia Sister    Diabetes Sister        On insulin   Depression Sister    Kidney disease Brother        After dialysis he had renal transplant   Diabetes Brother    Coronary artery disease Brother    Peripheral Artery  Disease Brother    Stroke Brother 75   Bladder Cancer Brother    Diabetes Brother       Review of Systems: All other systems reviewed and are otherwise negative except as noted above.  Physical Exam: Vitals:   08/01/19 0423 08/01/19 0630 08/01/19 0737 08/01/19 1218  BP: (!) 130/52 (!) 137/52 (!) 148/54 (!) 152/53  Pulse:   72 61  Resp: 16 16 16 16   Temp: 98 F (36.7 C) 98.2 F (36.8 C) 98.2 F (36.8 C) 98.1 F (36.7 C)  TempSrc: Oral Oral Oral Oral  SpO2: 97% 98% 95% 100%    GEN- The patient is well appearing, alert and oriented x 3 today.   Head- normocephalic, atraumatic Eyes-  Sclera clear, conjunctiva pink Ears- hearing intact Oropharynx- clear Neck- supple Lungs- CTA b/l, normal work of breathing Heart- RRR, no murmurs, rubs or gallops  GI- soft, NT, ND Extremities- no clubbing, cyanosis, or edema MS- no significant deformity or atrophy Skin- no rash or lesion Psych- euthymic mood, full affect   Labs:   Lab Results  Component Value Date   WBC 9.4 08/01/2019   HGB 13.4 08/01/2019   HCT 40.2 08/01/2019   MCV 95.7 08/01/2019   PLT 264 08/01/2019    Recent Labs  Lab 08/01/19 0456  NA 139  K 4.1  CL 104  CO2 24  BUN 17  CREATININE 0.59  CALCIUM  9.0  PROT 5.7*  BILITOT 0.9  ALKPHOS 83  ALT 19  AST 17  GLUCOSE 207*   No results found for: CKTOTAL, CKMB, CKMBINDEX, TROPONINI Lab Results  Component Value Date   CHOL 193 08/01/2019   CHOL 161 05/22/2018   Lab Results  Component Value Date   HDL 56 08/01/2019   HDL 48 05/22/2018   Lab Results  Component Value Date   LDLCALC 118 (H) 08/01/2019   LDLCALC 91 05/22/2018   Lab Results  Component Value Date   TRIG 97 08/01/2019   TRIG 112 05/22/2018   Lab Results  Component Value Date   CHOLHDL 3.4 08/01/2019   CHOLHDL 3.4 05/22/2018   No results found for: LDLDIRECT  No results found for: DDIMER   Radiology/Studies:   CT ANGIO HEAD W OR WO CONTRAST Result Date: 08/01/2019 CLINICAL DATA:  Follow-up examination for acute stroke. EXAM: CT ANGIOGRAPHY HEAD AND NECK TECHNIQUE: Multidetector CT imaging of the head and neck was performed using the standard protocol during bolus administration of intravenous contrast. Multiplanar CT image reconstructions and MIPs were obtained to evaluate the vascular anatomy. Carotid stenosis measurements (when applicable) are obtained utilizing NASCET criteria, using the distal internal carotid diameter as the denominator. CONTRAST:  26mL OMNIPAQUE IOHEXOL 350 MG/ML SOLN COMPARISON:  Prior MRI from 07/31/2019. FINDINGS: CTA NECK FINDINGS Aortic arch: Partially visualized aortic arch of normal caliber with normal branch pattern. Origin of the great vessels incompletely evaluated on this exam. Mild atheromatous plaque within the arch itself. Visualized subclavian arteries widely patent. Right carotid system: Right common and internal carotid arteries widely patent without stenosis, dissection, or occlusion. Left carotid system: Left common and internal carotid arteries widely patent without stenosis, dissection or occlusion. Vertebral arteries: Left vertebral artery hypoplastic and arises directly from the aortic arch. Strongly dominant right vertebral  artery. Vertebral arteries widely patent without stenosis, dissection or occlusion. Skeleton: No acute osseous abnormality. No discrete or worrisome osseous lesions. Moderate cervical spondylosis noted at C5-6 and C6-7, with grade 1 anterolisthesis of C4 on C5. Other neck:  No other acute soft tissue abnormality within the neck. No mass lesion or adenopathy. Upper chest: 5 mm right upper lobe nodule (series 5, image 146), indeterminate. Visualized upper chest otherwise unremarkable without acute abnormality. Review of the MIP images confirms the above findings CTA HEAD FINDINGS Anterior circulation: Petrous segments widely patent bilaterally. Scattered atheromatous plaque within the cavernous/supraclinoid ICAs with associated mild to moderate multifocal narrowing, most pronounced at the para clinoid right ICA. ICA termini well perfused. A1 segments patent bilaterally. Normal anterior communicating artery. Anterior cerebral arteries widely patent to their distal aspects. No M1 stenosis or occlusion. Normal MCA bifurcations. Distal MCA branches well perfused and symmetric. Posterior circulation: Minimal non stenotic plaque noted within the proximal right V4 segment without stenosis. Vertebral arteries otherwise widely patent to the vertebrobasilar junction. Both picas patent proximally. Basilar patent to its distal aspect without stenosis. Superior cerebral arteries patent bilaterally. Left PCA supplied via the basilar. Fetal type origin of the right PCA. Both PCAs well perfused to their distal aspects. Venous sinuses: Grossly patent allowing for timing the contrast bolus. Anatomic variants: Fetal type right PCA.  No intracranial aneurysm. Review of the MIP images confirms the above findings IMPRESSION: 1. Negative CTA for large vessel occlusion or other acute vascular abnormality. 2. Atheromatous change about the carotid siphons with associated mild to moderate multifocal narrowing, greater on the right. 3. No other  hemodynamically significant or correctable stenosis about the major arterial vasculature of the head and neck. 4. 5 mm right upper lobe nodule, indeterminate. No follow-up needed if patient is low-risk. Non-contrast chest CT can be considered in 12 months if patient is high-risk. This recommendation follows the consensus statement: Guidelines for Management of Incidental Pulmonary Nodules Detected on CT Images: From the Fleischner Society 2017; Radiology 2017; 284:228-243. Electronically Signed   By: Jeannine Boga M.D.   On: 08/01/2019 03:55    CT Head Wo Contrast Result Date: 07/31/2019 CLINICAL DATA:  Dizziness EXAM: CT HEAD WITHOUT CONTRAST TECHNIQUE: Contiguous axial images were obtained from the base of the skull through the vertex without intravenous contrast. COMPARISON:  None. FINDINGS: Brain: No evidence of acute infarction, hemorrhage, hydrocephalus, extra-axial collection or mass lesion/mass effect. Cavum septum pellucidum. Mild cerebral volume loss. Vascular: Atherosclerotic calcifications involving the large vessels of the skull base. No unexpected hyperdense vessel. Skull: Normal. Negative for fracture or focal lesion. Sinuses/Orbits: No acute finding. Other: None. IMPRESSION: 1. No acute intracranial findings. 2. Mild age related cerebral volume loss. Electronically Signed   By: Davina Poke D.O.   On: 07/31/2019 14:11     MR BRAIN WO CONTRAST Result Date: 07/31/2019 CLINICAL DATA:  Worsening dizziness EXAM: MRI HEAD WITHOUT CONTRAST TECHNIQUE: Multiplanar, multiecho pulse sequences of the brain and surrounding structures were obtained without intravenous contrast. COMPARISON:  None. FINDINGS: Brain: Small subcentimeter foci of restricted diffusion are present within the right cerebellar hemisphere. There is no evidence of intracranial hemorrhage. Patchy small foci of T2 hyperintensity in the supratentorial white matter are nonspecific but may reflect minor chronic microvascular  ischemic changes. There is no intracranial mass, mass effect, or edema. There is no hydrocephalus or extra-axial fluid collection. Ventricles and sulci are within normal limits in size and configuration, noting presence of a cavum septum pellucidum et vergae. Vascular: Major vessel flow voids at the skull base are preserved. Skull and upper cervical spine: Normal marrow signal is preserved. Sinuses/Orbits: Minor mucosal thickening.  Orbits are unremarkable. Other: Sella is unremarkable.  Mastoid air cells are clear. IMPRESSION: Multiple  small acute right cerebellar infarcts. Minor chronic microvascular ischemic changes. Electronically Signed   By: Macy Mis M.D.   On: 07/31/2019 16:51     VAS [place] LOWER EXTREMITY VENOUS (DVT) Result Date: 08/01/2019  Lower Venous DVTStudy Indications: Stroke.  Comparison Study: No prior study Performing Technologist: Maudry Mayhew MHA, RDMS, RVT, RDCS  Examination Guidelines: A complete evaluation includes B-mode imaging, spectral Doppler, color Doppler, and power Doppler as needed of all accessible portions of each vessel. Bilateral testing is considered an integral part of a complete examination. Limited examinations for reoccurring indications may be performed as noted. The reflux portion of the exam is performed with the patient in reverse Trendelenburg.                       Summary: RIGHT: - There is no evidence of deep vein thrombosis in the lower extremity.  - No cystic structure found in the popliteal fossa.  LEFT: - There is no evidence of deep vein thrombosis in the lower extremity.  - No cystic structure found in the popliteal fossa.  *See table(s) above for measurements and observations.    Preliminary     12-lead ECG SR All prior EKG's in EPIC reviewed with no documented atrial fibrillation  Telemetry SR  Assessment and Plan:  1. Cryptogenic stroke The patient presents with cryptogenic stroke.  Dr. Curt Bears has seen and examined the patient.  he  spoke at length with the patient about monitoring for afib with either a 30 day event monitor or an implantable loop recorder.  Risks, benefits, and alteratives to implantable loop recorder were discussed with the patient today.   At this time, the patient is very clear in her decision to proceed with implantable loop recorder.   Wound care was reviewed with the patient (keep incision clean and dry for 3 days).  Wound check  be scheduled for the patient  Please call with questions.   Baldwin Jamaica, PA-C 08/01/2019  I have seen and examined this patient with Tommye Standard.  Agree with above, note added to reflect my findings.  On exam, RRR, no murmurs, lungs clear.  Patient presented to the hospital with cryptogenic stroke. To date, no cause has been found. TEE planned for today. If unrevealing,  plan for LINQ monitor to look for atrial fibrillation. Risks and benefits discussed. Risks include but not limited to bleeding and infection. The patient understands the risks and has agreed to the procedure.   M.  MD 08/01/2019 1:16 PM

## 2019-08-01 NOTE — Social Work (Signed)
CSW met with pt at bedside. CSW introduced self and explained her role. CSW completed sbirt with pt.  Pt scored a 0 on the sbirt scale. Pt denied alcohol use. Pt denied substance use. Pt did not need resources at this time.  Emeterio Reeve, Latanya Presser, [place] Social Worker [phone]

## 2019-08-01 NOTE — Evaluation (Signed)
Physical Therapy Evaluation Patient Details Name: [name] MRN: [id number] DOB: 10-23-1945 Today's Date: 08/01/2019   History of Present Illness  [name] is a 74 y.o. female with medical history significant of actinic keratosis, type 2 diabetes, essential hypertension, GERD, hyperlipidemia, morbid obesity who is coming to the emergency department due to on and off dizziness for the past few months, but this has been particularly very intense over the last 2 days. CT revealed R cerebellar infarct.    Clinical Impression  Pt admitted with above. Pt presenting with altered equilibrium and impaired balance requiring assist for safe ambulation. Pt at moderate falls risk as indicated by score of 14 on the DGI. Son present and discussed that pt will need 24/7 assist initially and would need a ride to outpatient PT. Son agreed. Acute PT to cont to follow.    Follow Up Recommendations Outpatient PT;Supervision/Assistance - 24 hour    Equipment Recommendations  None recommended by PT    Recommendations for Other Services       Precautions / Restrictions Precautions Precautions: Fall Restrictions Weight Bearing Restrictions: No      Mobility  Bed Mobility Overal bed mobility: Modified Independent             General bed mobility comments: HOB elevated (has sleep number at home) increased time, no use of bed rail, partly lAbored by body habitus  Transfers Overall transfer level: Needs assistance Equipment used: None Transfers: Sit to/from Stand Sit to Stand: Min guard         General transfer comment: min guard for safety due to noted dizziness, pt guarded but no physical assist needed  Ambulation/Gait Ambulation/Gait assistance: Min guard;Min assist Gait Distance (Feet): 200 Feet Assistive device: None Gait Pattern/deviations: Step-through pattern;Decreased stride length;Drifts right/left Gait velocity: decreased Gait velocity interpretation: <1.31  ft/sec, indicative of household ambulator General Gait Details: pt slow and guarded compared to normal, pt unable to lift head up to ceiling without LOB, pt vearing to the L predominately, pt with 2 episdoes of dizziness causing LOB requiring minA to maintain balance.   Stairs Stairs: Yes Stairs assistance: Min guard Stair Management: One rail Right Number of Stairs: 12 General stair comments: educated on ascending with stronger leg and descending the weaker leg as pt reports "my R knee is bad".   Wheelchair Mobility    Modified Rankin (Stroke Patients Only) Modified Rankin (Stroke Patients Only) Pre-Morbid Rankin Score: No symptoms Modified Rankin: Moderate disability     Balance Overall balance assessment: Needs assistance Sitting-balance support: Feet unsupported;No upper extremity supported Sitting balance-Leahy Scale: Fair Sitting balance - Comments: pt able to don socks at EOB but had to bring LE up to opposite knee so pt not bending over   Standing balance support: No upper extremity supported Standing balance-Leahy Scale: Fair Standing balance comment: pt with episodes of LOB requiring minA                 Standardized Balance Assessment Standardized Balance Assessment : Dynamic Gait Index   Dynamic Gait Index Level Surface: Mild Impairment Change in Gait Speed: Moderate Impairment Gait with Horizontal Head Turns: Mild Impairment Gait with Vertical Head Turns: Moderate Impairment Gait and Pivot Turn: Mild Impairment Step Over Obstacle: Mild Impairment Step Around Obstacles: Mild Impairment Steps: Mild Impairment Total Score: 14       Pertinent Vitals/Pain Pain Assessment: No/denies pain    Home Living Family/patient expects to be discharged to:: Private residence Living Arrangements: Parent(dtr and grandson) Available  Help at Discharge: Family;Available 24 hours/day Type of Home: House Home Access: Stairs to enter Entrance Stairs-Rails:  Right Entrance Stairs-Number of Steps: 3 Home Layout: One level;Laundry or work area in [employer]: None      Prior Function Level of Independence: Independent         Comments: driving     Hand Dominance   Dominant Hand: Right    Extremity/Trunk Assessment   Upper Extremity Assessment Upper Extremity Assessment: LUE deficits/detail LUE Deficits / Details: mild weakness, grossly 4/5 compared to R    Lower Extremity Assessment Lower Extremity Assessment: Overall WFL for tasks assessed    Cervical / Trunk Assessment Cervical / Trunk Assessment: Normal  Communication   Communication: No difficulties  Cognition Arousal/Alertness: Awake/alert Behavior During Therapy: WFL for tasks assessed/performed Overall Cognitive Status: Within Functional Limits for tasks assessed                                 General Comments: pt fearful of falling due to onset of dizziness      General Comments General comments (skin integrity, edema, etc.): pt reports some blurred vision, occasional double vision, pt assisted to bathroom, pt indep with pericare in sitting. pt able to stand at sink and wash hands with min guard assist    Exercises     Assessment/Plan    PT Assessment Patient needs continued PT services  PT Problem List Decreased strength;Decreased activity tolerance;Decreased balance;Decreased mobility;Decreased coordination;Decreased cognition;Decreased knowledge of use of DME;Decreased safety awareness       PT Treatment Interventions DME instruction;Gait training;Stair training;Functional mobility training;Therapeutic activities;Therapeutic exercise;Balance training;Neuromuscular re-education    PT Goals (Current goals can be found in the Care Plan section)  Acute Rehab PT Goals Patient Stated Goal: home PT Goal Formulation: With patient Time For Goal Achievement: 08/15/19 Potential to Achieve Goals: Good Additional Goals Additional Goal  #1: Pt to score >19 on DGI to indicate minimal falls risk.    Frequency Min 4X/week   Barriers to discharge        Co-evaluation               AM-PAC PT "6 Clicks" Mobility  Outcome Measure Help needed turning from your back to your side while in a flat bed without using bedrails?: None Help needed moving from lying on your back to sitting on the side of a flat bed without using bedrails?: None Help needed moving to and from a bed to a chair (including a wheelchair)?: None Help needed standing up from a chair using your arms (e.g., wheelchair or bedside chair)?: None Help needed to walk in hospital room?: A Little Help needed climbing 3-5 steps with a railing? : A Little 6 Click Score: 22    End of Session Equipment Utilized During Treatment: Gait belt Activity Tolerance: Patient tolerated treatment well Patient left: in chair;with call bell/phone within reach;with family/visitor present Nurse Communication: Mobility status PT Visit Diagnosis: Unsteadiness on feet (R26.81);Difficulty in walking, not elsewhere classified (R26.2)    Time: [id number] PT Time Calculation (min) (ACUTE ONLY): 28 min   Charges:   PT Evaluation $PT Eval Moderate Complexity: 1 Mod PT Treatments $Gait Training: 8-22 mins        Kittie Plater, PT, DPT Acute Rehabilitation Services Pager #: [phone] Office #: [phone]   Berline Lopes 08/01/2019, 8:50 AM

## 2019-08-01 NOTE — Progress Notes (Signed)
Bilateral lower extremity venous duplex completed. Refer to "CV Proc" under chart review to view preliminary results.  08/01/2019 11:16 AM Kelby Aline., MHA, RVT, RDCS, RDMS

## 2019-08-01 NOTE — Progress Notes (Signed)
OT Cancellation Note  Patient Details Name: [name] MRN: [id number] DOB: 1945/06/19   Cancelled Treatment:    Reason Eval/Treat Not Completed: Patient at procedure or test/ unavailable. Plan to reattempt.  Tyrone Schimke, OT Acute [facility] Services Pager: [phone] Office: [phone]  08/01/2019, 10:38 AM

## 2019-08-01 NOTE — Progress Notes (Signed)
PROGRESS NOTE    [name]  [id number] DOB: 03-06-1946 DOA: 07/31/2019 PCP: Orpah Melter, MD    Brief Narrative:  74 year old female with history of diabetes, obesity, hypertension hyperlipidemia who was having on and off dizziness for last many months presented to the emergency room for evaluation of acute episode of dizziness that has been persistent for the last few days.  She was basically having difficulty balancing on either side while walking.  Persistent symptoms brought her to the ER. In the emergency room, hemodynamically stable.  No focal motor and sensory deficit.  An MRI of the brain showed right cerebellar acute infarcts.  Outside of TPA window.  Admitted to the hospital for stroke work-up.   Assessment & Plan:   Principal Problem:   Acute cerebrovascular accident of cerebellum ([place]) Active Problems:   Essential hypertension   Hyperlipidemia   DM (diabetes mellitus), type 2 with complications (HCC)   Morbid obesity (HCC)   GERD (gastroesophageal reflux disease)   Leukocytosis  Acute ischemic stroke: Multiple right cerebellar stroke: Clinical findings, dizziness and gait instability. CT head findings, no acute findings. MRI of the brain, multiple small acute right cerebellar infarcts.  Minor chronic ischemic changes. CTA of the head and neck: Negative for large vessel occlusion.  Atherosclerotic changes. 2D echocardiogram, pending. Antiplatelet therapy, not on any treatment at home.  Currently on aspirin and Plavix. LDL 118.  Started on high-dose statin. Hemoglobin A1c, 10.4.  On insulin at home. DVT prophylaxis, Lovenox subcu Therapy recommendations, outpatient therapies Planned for implantable loop recorder today. Follow neurology.  Essential hypertension: Fairly stable.  Allowing permissive hypertension.  Hyperlipidemia: Not on treatment at home.  LDL 118.  Goal less than 70.  Started on atorvastatin.  Type 2 diabetes, uncontrolled with  hyperglycemia: Recently on increasing dose of Lantus and Victoza.  Continue insulin.  Hemoglobin A1c is 10.  Goal is less than 7.  Leukocytosis: With no evidence of localized infection.  Probably stress reaction.  Normalized.   DVT prophylaxis: SCDs Code Status: Full code Family Communication: Son at the bedside Disposition Plan: Status is: Inpatient  Remains inpatient appropriate because:Ongoing diagnostic testing needed not appropriate for outpatient work up   Dispo: The patient is from: Home              Anticipated d/c is to: Home              Anticipated d/c date is: 1 day              Patient currently is not medically stable to d/c.          Consultants:   Neurology  Cardiology  Procedures:   None  Antimicrobials:   None   Subjective: Patient seen and examined.  No overnight events.  She did work with therapies, she was able to ambulate still with some balance issues.  Son at the bedside.  Objective: Vitals:   08/01/19 0423 08/01/19 0630 08/01/19 0737 08/01/19 1218  BP: (!) 130/52 (!) 137/52 (!) 148/54 (!) 152/53  Pulse:   72 61  Resp: 16 16 16 16   Temp: 98 F (36.7 C) 98.2 F (36.8 C) 98.2 F (36.8 C) 98.1 F (36.7 C)  TempSrc: Oral Oral Oral Oral  SpO2: 97% 98% 95% 100%    Intake/Output Summary (Last 24 hours) at 08/01/2019 1326 Last data filed at 08/01/2019 0738 Gross per 24 hour  Intake 1480 ml  Output --  Net 1480 ml   There were no vitals  filed for this visit.  Examination:  General exam: Appears calm and comfortable  Respiratory system: Clear to auscultation. Respiratory effort normal. Cardiovascular system: S1 & S2 heard, RRR. No JVD, murmurs, rubs, gallops or clicks. No pedal edema. Gastrointestinal system: Abdomen is nondistended, soft and nontender. No organomegaly or masses felt. Normal bowel sounds heard. Central nervous system: Alert and oriented. No focal neurological deficits. Gait and balance not examined at this  time. Extremities: Symmetric 5 x 5 power. Skin: No rashes, lesions or ulcers Psychiatry: Judgement and insight appear normal. Mood & affect appropriate.     Data Reviewed: I have personally reviewed following labs and imaging studies  CBC: Recent Labs  Lab 07/31/19 1113 08/01/19 0456  WBC 12.3* 9.4  HGB 14.5 13.4  HCT 44.7 40.2  MCV 96.1 95.7  PLT 291 [id number]   Basic Metabolic Panel: Recent Labs  Lab 07/31/19 1113 08/01/19 0456  NA 135 139  K 4.2 4.1  CL 99 104  CO2 22 24  GLUCOSE 392* 207*  BUN 15 17  CREATININE 0.66 0.59  CALCIUM 9.3 9.0   GFR: CrCl cannot be calculated (Unknown ideal weight.). Liver Function Tests: Recent Labs  Lab 08/01/19 0456  AST 17  ALT 19  ALKPHOS 83  BILITOT 0.9  PROT 5.7*  ALBUMIN 2.9*   No results for input(s): LIPASE, AMYLASE in the last 168 hours. No results for input(s): AMMONIA in the last 168 hours. Coagulation Profile: No results for input(s): INR, PROTIME in the last 168 hours. Cardiac Enzymes: No results for input(s): CKTOTAL, CKMB, CKMBINDEX, TROPONINI in the last 168 hours. BNP (last 3 results) No results for input(s): PROBNP in the last 8760 hours. HbA1C: Recent Labs    08/01/19 0456  HGBA1C 10.4*   CBG: Recent Labs  Lab 07/31/19 1154 07/31/19 2236 08/01/19 0619 08/01/19 1219  GLUCAP 375* 233* 171* 183*   Lipid Profile: Recent Labs    08/01/19 0456  CHOL 193  HDL 56  LDLCALC 118*  TRIG 97  CHOLHDL 3.4   Thyroid Function Tests: No results for input(s): TSH, T4TOTAL, FREET4, T3FREE, THYROIDAB in the last 72 hours. Anemia Panel: No results for input(s): VITAMINB12, FOLATE, FERRITIN, TIBC, IRON, RETICCTPCT in the last 72 hours. Sepsis Labs: No results for input(s): PROCALCITON, LATICACIDVEN in the last 168 hours.  Recent Results (from the past 240 hour(s))  SARS CORONAVIRUS 2 (TAT 6-24 HRS) Nasopharyngeal Nasopharyngeal Swab     Status: None   Collection Time: 07/31/19  9:00 PM   Specimen:  Nasopharyngeal Swab  Result Value Ref Range Status   SARS Coronavirus 2 NEGATIVE NEGATIVE Final    Comment: (NOTE) SARS-CoV-2 target nucleic acids are NOT DETECTED. The SARS-CoV-2 RNA is generally detectable in upper and lower respiratory specimens during the acute phase of infection. Negative results do not preclude SARS-CoV-2 infection, do not rule out co-infections with other pathogens, and should not be used as the sole basis for treatment or other patient management decisions. Negative results must be combined with clinical observations, patient history, and epidemiological information. The expected result is Negative. Fact Sheet for Patients: SugarRoll.be Fact Sheet for Healthcare Providers: https://www.woods-mathews.com/ This test is not yet approved or cleared by the [place] FDA and  has been authorized for detection and/or diagnosis of SARS-CoV-2 by FDA under an Emergency Use Authorization (EUA). This EUA will remain  in effect (meaning this test can be used) for the duration of the COVID-19 declaration under Section 56 4(b)(1) of the Act, 21 U.S.C. section  360bbb-3(b)(1), unless the authorization is terminated or revoked sooner. Performed at [facility], [place] [address], [place], [place] [address]          Radiology Studies: CT ANGIO HEAD W OR WO CONTRAST  Result Date: 08/01/2019 CLINICAL DATA:  Follow-up examination for acute stroke. EXAM: CT ANGIOGRAPHY HEAD AND NECK TECHNIQUE: Multidetector CT imaging of the head and neck was performed using the standard protocol during bolus administration of intravenous contrast. Multiplanar CT image reconstructions and MIPs were obtained to evaluate the vascular anatomy. Carotid stenosis measurements (when applicable) are obtained utilizing NASCET criteria, using the distal internal carotid diameter as the denominator. CONTRAST:  15mL OMNIPAQUE IOHEXOL 350 MG/ML SOLN COMPARISON:   Prior MRI from 07/31/2019. FINDINGS: CTA NECK FINDINGS Aortic arch: Partially visualized aortic arch of normal caliber with normal branch pattern. Origin of the great vessels incompletely evaluated on this exam. Mild atheromatous plaque within the arch itself. Visualized subclavian arteries widely patent. Right carotid system: Right common and internal carotid arteries widely patent without stenosis, dissection, or occlusion. Left carotid system: Left common and internal carotid arteries widely patent without stenosis, dissection or occlusion. Vertebral arteries: Left vertebral artery hypoplastic and arises directly from the aortic arch. Strongly dominant right vertebral artery. Vertebral arteries widely patent without stenosis, dissection or occlusion. Skeleton: No acute osseous abnormality. No discrete or worrisome osseous lesions. Moderate cervical spondylosis noted at C5-6 and C6-7, with grade 1 anterolisthesis of C4 on C5. Other neck: No other acute soft tissue abnormality within the neck. No mass lesion or adenopathy. Upper chest: 5 mm right upper lobe nodule (series 5, image 146), indeterminate. Visualized upper chest otherwise unremarkable without acute abnormality. Review of the MIP images confirms the above findings CTA HEAD FINDINGS Anterior circulation: Petrous segments widely patent bilaterally. Scattered atheromatous plaque within the cavernous/supraclinoid ICAs with associated mild to moderate multifocal narrowing, most pronounced at the para clinoid right ICA. ICA termini well perfused. A1 segments patent bilaterally. Normal anterior communicating artery. Anterior cerebral arteries widely patent to their distal aspects. No M1 stenosis or occlusion. Normal MCA bifurcations. Distal MCA branches well perfused and symmetric. Posterior circulation: Minimal non stenotic plaque noted within the proximal right V4 segment without stenosis. Vertebral arteries otherwise widely patent to the vertebrobasilar  junction. Both picas patent proximally. Basilar patent to its distal aspect without stenosis. Superior cerebral arteries patent bilaterally. Left PCA supplied via the basilar. Fetal type origin of the right PCA. Both PCAs well perfused to their distal aspects. Venous sinuses: Grossly patent allowing for timing the contrast bolus. Anatomic variants: Fetal type right PCA.  No intracranial aneurysm. Review of the MIP images confirms the above findings IMPRESSION: 1. Negative CTA for large vessel occlusion or other acute vascular abnormality. 2. Atheromatous change about the carotid siphons with associated mild to moderate multifocal narrowing, greater on the right. 3. No other hemodynamically significant or correctable stenosis about the major arterial vasculature of the head and neck. 4. 5 mm right upper lobe nodule, indeterminate. No follow-up needed if patient is low-risk. Non-contrast chest CT can be considered in 12 months if patient is high-risk. This recommendation follows the consensus statement: Guidelines for Management of Incidental Pulmonary Nodules Detected on CT Images: From the Fleischner Society 2017; Radiology 2017; 284:228-243. Electronically Signed   By: Jeannine Boga M.D.   On: 08/01/2019 03:55   CT Head Wo Contrast  Result Date: 07/31/2019 CLINICAL DATA:  Dizziness EXAM: CT HEAD WITHOUT CONTRAST TECHNIQUE: Contiguous axial images were obtained from the  base of the skull through the vertex without intravenous contrast. COMPARISON:  None. FINDINGS: Brain: No evidence of acute infarction, hemorrhage, hydrocephalus, extra-axial collection or mass lesion/mass effect. Cavum septum pellucidum. Mild cerebral volume loss. Vascular: Atherosclerotic calcifications involving the large vessels of the skull base. No unexpected hyperdense vessel. Skull: Normal. Negative for fracture or focal lesion. Sinuses/Orbits: No acute finding. Other: None. IMPRESSION: 1. No acute intracranial findings. 2. Mild age  related cerebral volume loss. Electronically Signed   By: Davina Poke D.O.   On: 07/31/2019 14:11   CT ANGIO NECK W OR WO CONTRAST  Result Date: 08/01/2019 CLINICAL DATA:  Follow-up examination for acute stroke. EXAM: CT ANGIOGRAPHY HEAD AND NECK TECHNIQUE: Multidetector CT imaging of the head and neck was performed using the standard protocol during bolus administration of intravenous contrast. Multiplanar CT image reconstructions and MIPs were obtained to evaluate the vascular anatomy. Carotid stenosis measurements (when applicable) are obtained utilizing NASCET criteria, using the distal internal carotid diameter as the denominator. CONTRAST:  45mL OMNIPAQUE IOHEXOL 350 MG/ML SOLN COMPARISON:  Prior MRI from 07/31/2019. FINDINGS: CTA NECK FINDINGS Aortic arch: Partially visualized aortic arch of normal caliber with normal branch pattern. Origin of the great vessels incompletely evaluated on this exam. Mild atheromatous plaque within the arch itself. Visualized subclavian arteries widely patent. Right carotid system: Right common and internal carotid arteries widely patent without stenosis, dissection, or occlusion. Left carotid system: Left common and internal carotid arteries widely patent without stenosis, dissection or occlusion. Vertebral arteries: Left vertebral artery hypoplastic and arises directly from the aortic arch. Strongly dominant right vertebral artery. Vertebral arteries widely patent without stenosis, dissection or occlusion. Skeleton: No acute osseous abnormality. No discrete or worrisome osseous lesions. Moderate cervical spondylosis noted at C5-6 and C6-7, with grade 1 anterolisthesis of C4 on C5. Other neck: No other acute soft tissue abnormality within the neck. No mass lesion or adenopathy. Upper chest: 5 mm right upper lobe nodule (series 5, image 146), indeterminate. Visualized upper chest otherwise unremarkable without acute abnormality. Review of the MIP images confirms the  above findings CTA HEAD FINDINGS Anterior circulation: Petrous segments widely patent bilaterally. Scattered atheromatous plaque within the cavernous/supraclinoid ICAs with associated mild to moderate multifocal narrowing, most pronounced at the para clinoid right ICA. ICA termini well perfused. A1 segments patent bilaterally. Normal anterior communicating artery. Anterior cerebral arteries widely patent to their distal aspects. No M1 stenosis or occlusion. Normal MCA bifurcations. Distal MCA branches well perfused and symmetric. Posterior circulation: Minimal non stenotic plaque noted within the proximal right V4 segment without stenosis. Vertebral arteries otherwise widely patent to the vertebrobasilar junction. Both picas patent proximally. Basilar patent to its distal aspect without stenosis. Superior cerebral arteries patent bilaterally. Left PCA supplied via the basilar. Fetal type origin of the right PCA. Both PCAs well perfused to their distal aspects. Venous sinuses: Grossly patent allowing for timing the contrast bolus. Anatomic variants: Fetal type right PCA.  No intracranial aneurysm. Review of the MIP images confirms the above findings IMPRESSION: 1. Negative CTA for large vessel occlusion or other acute vascular abnormality. 2. Atheromatous change about the carotid siphons with associated mild to moderate multifocal narrowing, greater on the right. 3. No other hemodynamically significant or correctable stenosis about the major arterial vasculature of the head and neck. 4. 5 mm right upper lobe nodule, indeterminate. No follow-up needed if patient is low-risk. Non-contrast chest CT can be considered in 12 months if patient is high-risk. This recommendation follows the consensus statement:  Guidelines for Management of Incidental Pulmonary Nodules Detected on CT Images: From the Fleischner Society 2017; Radiology 2017; 284:228-243. Electronically Signed   By: Jeannine Boga M.D.   On: 08/01/2019  03:55   MR BRAIN WO CONTRAST  Result Date: 07/31/2019 CLINICAL DATA:  Worsening dizziness EXAM: MRI HEAD WITHOUT CONTRAST TECHNIQUE: Multiplanar, multiecho pulse sequences of the brain and surrounding structures were obtained without intravenous contrast. COMPARISON:  None. FINDINGS: Brain: Small subcentimeter foci of restricted diffusion are present within the right cerebellar hemisphere. There is no evidence of intracranial hemorrhage. Patchy small foci of T2 hyperintensity in the supratentorial white matter are nonspecific but may reflect minor chronic microvascular ischemic changes. There is no intracranial mass, mass effect, or edema. There is no hydrocephalus or extra-axial fluid collection. Ventricles and sulci are within normal limits in size and configuration, noting presence of a cavum septum pellucidum et vergae. Vascular: Major vessel flow voids at the skull base are preserved. Skull and upper cervical spine: Normal marrow signal is preserved. Sinuses/Orbits: Minor mucosal thickening.  Orbits are unremarkable. Other: Sella is unremarkable.  Mastoid air cells are clear. IMPRESSION: Multiple small acute right cerebellar infarcts. Minor chronic microvascular ischemic changes. Electronically Signed   By: Macy Mis M.D.   On: 07/31/2019 16:51   ECHOCARDIOGRAM COMPLETE  Result Date: 08/01/2019    ECHOCARDIOGRAM REPORT   Patient Name:   SHERYLYN BLICK Date of Exam: 08/01/2019 Medical Rec #:  [id number]          Height:       61.0 in Accession #:    [id number]         Weight:       204.0 lb Date of Birth:  02-Aug-1945          BSA:          1.905 m Patient Age:    60 years           BP:           148/54 mmHg Patient Gender: F                  HR:           61 bpm. Exam Location:  Inpatient Procedure: 2D Echo, Cardiac Doppler and Color Doppler Indications:    Stroke 434.91 / I163.9  History:        Patient has no prior history of Echocardiogram examinations.                 Signs/Symptoms:Chest Pain;  Risk Factors:Hypertension, Diabetes,                 Dyslipidemia and Non-Smoker. GERD.  Sonographer:    Vickie Epley RDCS Referring Phys: [id number] [place]  1. Left ventricular ejection fraction, by estimation, is 60 to 65%. The left ventricle has normal function. The left ventricle has no regional wall motion abnormalities. Left ventricular diastolic parameters are consistent with Grade I diastolic dysfunction (impaired relaxation). Elevated left atrial pressure.  2. Right ventricular systolic function is normal. The right ventricular size is normal. There is normal pulmonary artery systolic pressure. The estimated right ventricular systolic pressure is Q000111Q mmHg.  3. The mitral valve is normal in structure. Trivial mitral valve regurgitation. No evidence of mitral stenosis.  4. Tricuspid valve regurgitation is moderate.  5. The aortic valve is normal in structure. Aortic valve regurgitation is not visualized. No aortic stenosis is present.  6. The inferior vena cava is normal in  size with greater than 50% respiratory variability, suggesting right atrial pressure of 3 mmHg. Conclusion(s)/Recommendation(s): No intracardiac source of embolism detected on this transthoracic study. A transesophageal echocardiogram is recommended to exclude cardiac source of embolism if clinically indicated. FINDINGS  Left Ventricle: Left ventricular ejection fraction, by estimation, is 60 to 65%. The left ventricle has normal function. The left ventricle has no regional wall motion abnormalities. The left ventricular internal cavity size was normal in size. There is  no left ventricular hypertrophy. Left ventricular diastolic parameters are consistent with Grade I diastolic dysfunction (impaired relaxation). Elevated left atrial pressure. Right Ventricle: The right ventricular size is normal. No increase in right ventricular wall thickness. Right ventricular systolic function is normal. There is normal pulmonary  artery systolic pressure. The tricuspid regurgitant velocity is 2.60 m/s, and  with an assumed right atrial pressure of 3 mmHg, the estimated right ventricular systolic pressure is Q000111Q mmHg. Left Atrium: Left atrial size was normal in size. Right Atrium: Right atrial size was normal in size. Pericardium: Trivial pericardial effusion is present. The pericardial effusion is posterior to the left ventricle. Mitral Valve: The mitral valve is normal in structure. Normal mobility of the mitral valve leaflets. Moderate mitral annular calcification. Trivial mitral valve regurgitation. No evidence of mitral valve stenosis. Tricuspid Valve: The tricuspid valve is normal in structure. Tricuspid valve regurgitation is moderate . No evidence of tricuspid stenosis. Aortic Valve: The aortic valve is normal in structure. Aortic valve regurgitation is not visualized. No aortic stenosis is present. Pulmonic Valve: The pulmonic valve was normal in structure. Pulmonic valve regurgitation is trivial. No evidence of pulmonic stenosis. Aorta: The aortic root is normal in size and structure. Venous: The inferior vena cava is normal in size with greater than 50% respiratory variability, suggesting right atrial pressure of 3 mmHg. IAS/Shunts: No atrial level shunt detected by color flow Doppler.  LEFT VENTRICLE PLAX 2D LVIDd:         4.70 cm      Diastology LVIDs:         2.80 cm      LV e' lateral:   5.44 cm/s LV PW:         0.80 cm      LV E/e' lateral: 21.0 LV IVS:        0.80 cm      LV e' medial:    3.92 cm/s LVOT diam:     1.80 cm      LV E/e' medial:  29.1 LV SV:         64 LV SV Index:   34 LVOT Area:     2.54 cm  LV Volumes (MOD) LV vol d, MOD A2C: 102.0 ml LV vol d, MOD A4C: 87.6 ml LV vol s, MOD A2C: 32.1 ml LV vol s, MOD A4C: 30.5 ml LV SV MOD A2C:     69.9 ml LV SV MOD A4C:     87.6 ml LV SV MOD BP:      66.6 ml RIGHT VENTRICLE RV S prime:     18.20 cm/s TAPSE (M-mode): 2.3 cm LEFT ATRIUM             Index       RIGHT ATRIUM            Index LA diam:        3.80 cm 2.00 cm/m  RA Area:     11.50 cm LA Vol (A2C):   40.7 ml 21.37 ml/m RA  Volume:   24.20 ml  12.71 ml/m LA Vol (A4C):   30.8 ml 16.17 ml/m LA Biplane Vol: 35.9 ml 18.85 ml/m  AORTIC VALVE LVOT Vmax:   102.00 cm/s LVOT Vmean:  71.800 cm/s LVOT VTI:    0.251 m  AORTA Ao Root diam: 2.80 cm MITRAL VALVE                TRICUSPID VALVE MV Area (PHT): 2.87 cm     TR Peak grad:   27.0 mmHg MV Decel Time: 264 msec     TR Vmax:        260.00 cm/s MV E velocity: 114.00 cm/s MV A velocity: 121.00 cm/s  SHUNTS MV E/A ratio:  0.94         Systemic VTI:  0.25 m                             Systemic Diam: 1.80 cm Candee Furbish MD Electronically signed by Candee Furbish MD Signature Date/Time: 08/01/2019/11:48:52 AM    Final    VAS [place] LOWER EXTREMITY VENOUS (DVT)  Result Date: 08/01/2019  Lower Venous DVTStudy Indications: Stroke.  Comparison Study: No prior study Performing Technologist: Maudry Mayhew MHA, RDMS, RVT, RDCS  Examination Guidelines: A complete evaluation includes B-mode imaging, spectral Doppler, color Doppler, and power Doppler as needed of all accessible portions of each vessel. Bilateral testing is considered an integral part of a complete examination. Limited examinations for reoccurring indications may be performed as noted. The reflux portion of the exam is performed with the patient in reverse Trendelenburg.  +---------+---------------+---------+-----------+----------+--------------+ RIGHT    CompressibilityPhasicitySpontaneityPropertiesThrombus Aging +---------+---------------+---------+-----------+----------+--------------+ CFV      Full           Yes      Yes                                 +---------+---------------+---------+-----------+----------+--------------+ SFJ      Full                                                        +---------+---------------+---------+-----------+----------+--------------+ FV Prox  Full                                                         +---------+---------------+---------+-----------+----------+--------------+ FV Mid   Full                                                        +---------+---------------+---------+-----------+----------+--------------+ FV DistalFull                                                        +---------+---------------+---------+-----------+----------+--------------+ PFV      Full                                                        +---------+---------------+---------+-----------+----------+--------------+  POP      Full           Yes      Yes                                 +---------+---------------+---------+-----------+----------+--------------+ PTV      Full                                                        +---------+---------------+---------+-----------+----------+--------------+ PERO     Full                                                        +---------+---------------+---------+-----------+----------+--------------+   +---------+---------------+---------+-----------+----------+--------------+ LEFT     CompressibilityPhasicitySpontaneityPropertiesThrombus Aging +---------+---------------+---------+-----------+----------+--------------+ CFV      Full           Yes      Yes                                 +---------+---------------+---------+-----------+----------+--------------+ SFJ      Full                                                        +---------+---------------+---------+-----------+----------+--------------+ FV Prox  Full                                                        +---------+---------------+---------+-----------+----------+--------------+ FV Mid   Full                                                        +---------+---------------+---------+-----------+----------+--------------+ FV DistalFull                                                         +---------+---------------+---------+-----------+----------+--------------+ PFV      Full                                                        +---------+---------------+---------+-----------+----------+--------------+ POP      Full           Yes      Yes                                 +---------+---------------+---------+-----------+----------+--------------+  PTV      Full                                                        +---------+---------------+---------+-----------+----------+--------------+ PERO     Full                                                        +---------+---------------+---------+-----------+----------+--------------+     Summary: RIGHT: - There is no evidence of deep vein thrombosis in the lower extremity.  - No cystic structure found in the popliteal fossa.  LEFT: - There is no evidence of deep vein thrombosis in the lower extremity.  - No cystic structure found in the popliteal fossa.  *See table(s) above for measurements and observations.    Preliminary         Scheduled Meds: . aspirin EC  81 mg Oral Daily  . atorvastatin  40 mg Oral Daily  . clopidogrel  75 mg Oral Daily  . insulin aspart  0-15 Units Subcutaneous TID WC   Continuous Infusions:   LOS: 1 day    Time spent: 25 minutes    Barb Merino, MD [facility] Pager [phone]

## 2019-08-01 NOTE — Progress Notes (Signed)
STROKE TEAM PROGRESS NOTE   INTERVAL HISTORY Pt lying in bed, daughter at bedside.  Patient awake alert, recounted HPI with me.  She stated that she has baseline on and off imbalance on walking, sometimes veering to the left and sometimes veering to the right, mostly at a time when she standing up walking.  2-3 times per day, each time lasting seconds to minutes.  No nausea vomiting or headache.  Monday, she was walking outside started feeling nauseous and dizziness.  She went home started to have nauseous vomiting, she slept all day long but second day she is to have dizzy but not room spinning, she came to ER for evaluation.  Currently, she denies any dizziness, vertigo or lightheadedness.  She felt she is back to baseline.  PT OT recommend outpatient PT/OT.  I discussed about MRI findings with her and recommend loop recorder.  She is in agreement.  Of note, her A1c 10.4 and patient is educated on stroke risk factor modification.  Vitals:   08/01/19 0423 08/01/19 0630 08/01/19 0737 08/01/19 1218  BP: (!) 130/52 (!) 137/52 (!) 148/54 (!) 152/53  Pulse:   72 61  Resp: 16 16 16 16   Temp: 98 F (36.7 C) 98.2 F (36.8 C) 98.2 F (36.8 C) 98.1 F (36.7 C)  TempSrc: Oral Oral Oral Oral  SpO2: 97% 98% 95% 100%    CBC:  Recent Labs  Lab 07/31/19 1113 08/01/19 0456  WBC 12.3* 9.4  HGB 14.5 13.4  HCT 44.7 40.2  MCV 96.1 95.7  PLT 291 [id number]    Basic Metabolic Panel:  Recent Labs  Lab 07/31/19 1113 08/01/19 0456  NA 135 139  K 4.2 4.1  CL 99 104  CO2 22 24  GLUCOSE 392* 207*  BUN 15 17  CREATININE 0.66 0.59  CALCIUM 9.3 9.0   Lipid Panel:     Component Value Date/Time   CHOL 193 08/01/2019 0456   CHOL 161 05/22/2018 1013   TRIG 97 08/01/2019 0456   HDL 56 08/01/2019 0456   HDL 48 05/22/2018 1013   CHOLHDL 3.4 08/01/2019 0456   VLDL 19 08/01/2019 0456   LDLCALC 118 (H) 08/01/2019 0456   LDLCALC 91 05/22/2018 1013   HgbA1c:  Lab Results  Component Value Date   HGBA1C  10.4 (H) 08/01/2019   Urine Drug Screen: No results found for: LABOPIA, COCAINSCRNUR, LABBENZ, AMPHETMU, THCU, LABBARB  Alcohol Level No results found for: ETH  IMAGING past 24 hours CT ANGIO HEAD W OR WO CONTRAST  Result Date: 08/01/2019 CLINICAL DATA:  Follow-up examination for acute stroke. EXAM: CT ANGIOGRAPHY HEAD AND NECK TECHNIQUE: Multidetector CT imaging of the head and neck was performed using the standard protocol during bolus administration of intravenous contrast. Multiplanar CT image reconstructions and MIPs were obtained to evaluate the vascular anatomy. Carotid stenosis measurements (when applicable) are obtained utilizing NASCET criteria, using the distal internal carotid diameter as the denominator. CONTRAST:  68mL OMNIPAQUE IOHEXOL 350 MG/ML SOLN COMPARISON:  Prior MRI from 07/31/2019. FINDINGS: CTA NECK FINDINGS Aortic arch: Partially visualized aortic arch of normal caliber with normal branch pattern. Origin of the great vessels incompletely evaluated on this exam. Mild atheromatous plaque within the arch itself. Visualized subclavian arteries widely patent. Right carotid system: Right common and internal carotid arteries widely patent without stenosis, dissection, or occlusion. Left carotid system: Left common and internal carotid arteries widely patent without stenosis, dissection or occlusion. Vertebral arteries: Left vertebral artery hypoplastic and arises directly from the  aortic arch. Strongly dominant right vertebral artery. Vertebral arteries widely patent without stenosis, dissection or occlusion. Skeleton: No acute osseous abnormality. No discrete or worrisome osseous lesions. Moderate cervical spondylosis noted at C5-6 and C6-7, with grade 1 anterolisthesis of C4 on C5. Other neck: No other acute soft tissue abnormality within the neck. No mass lesion or adenopathy. Upper chest: 5 mm right upper lobe nodule (series 5, image 146), indeterminate. Visualized upper chest otherwise  unremarkable without acute abnormality. Review of the MIP images confirms the above findings CTA HEAD FINDINGS Anterior circulation: Petrous segments widely patent bilaterally. Scattered atheromatous plaque within the cavernous/supraclinoid ICAs with associated mild to moderate multifocal narrowing, most pronounced at the para clinoid right ICA. ICA termini well perfused. A1 segments patent bilaterally. Normal anterior communicating artery. Anterior cerebral arteries widely patent to their distal aspects. No M1 stenosis or occlusion. Normal MCA bifurcations. Distal MCA branches well perfused and symmetric. Posterior circulation: Minimal non stenotic plaque noted within the proximal right V4 segment without stenosis. Vertebral arteries otherwise widely patent to the vertebrobasilar junction. Both picas patent proximally. Basilar patent to its distal aspect without stenosis. Superior cerebral arteries patent bilaterally. Left PCA supplied via the basilar. Fetal type origin of the right PCA. Both PCAs well perfused to their distal aspects. Venous sinuses: Grossly patent allowing for timing the contrast bolus. Anatomic variants: Fetal type right PCA.  No intracranial aneurysm. Review of the MIP images confirms the above findings IMPRESSION: 1. Negative CTA for large vessel occlusion or other acute vascular abnormality. 2. Atheromatous change about the carotid siphons with associated mild to moderate multifocal narrowing, greater on the right. 3. No other hemodynamically significant or correctable stenosis about the major arterial vasculature of the head and neck. 4. 5 mm right upper lobe nodule, indeterminate. No follow-up needed if patient is low-risk. Non-contrast chest CT can be considered in 12 months if patient is high-risk. This recommendation follows the consensus statement: Guidelines for Management of Incidental Pulmonary Nodules Detected on CT Images: From the Fleischner Society 2017; Radiology 2017;  284:228-243. Electronically Signed   By: Jeannine Boga M.D.   On: 08/01/2019 03:55   CT ANGIO NECK W OR WO CONTRAST  Result Date: 08/01/2019 CLINICAL DATA:  Follow-up examination for acute stroke. EXAM: CT ANGIOGRAPHY HEAD AND NECK TECHNIQUE: Multidetector CT imaging of the head and neck was performed using the standard protocol during bolus administration of intravenous contrast. Multiplanar CT image reconstructions and MIPs were obtained to evaluate the vascular anatomy. Carotid stenosis measurements (when applicable) are obtained utilizing NASCET criteria, using the distal internal carotid diameter as the denominator. CONTRAST:  15mL OMNIPAQUE IOHEXOL 350 MG/ML SOLN COMPARISON:  Prior MRI from 07/31/2019. FINDINGS: CTA NECK FINDINGS Aortic arch: Partially visualized aortic arch of normal caliber with normal branch pattern. Origin of the great vessels incompletely evaluated on this exam. Mild atheromatous plaque within the arch itself. Visualized subclavian arteries widely patent. Right carotid system: Right common and internal carotid arteries widely patent without stenosis, dissection, or occlusion. Left carotid system: Left common and internal carotid arteries widely patent without stenosis, dissection or occlusion. Vertebral arteries: Left vertebral artery hypoplastic and arises directly from the aortic arch. Strongly dominant right vertebral artery. Vertebral arteries widely patent without stenosis, dissection or occlusion. Skeleton: No acute osseous abnormality. No discrete or worrisome osseous lesions. Moderate cervical spondylosis noted at C5-6 and C6-7, with grade 1 anterolisthesis of C4 on C5. Other neck: No other acute soft tissue abnormality within the neck. No mass lesion or  adenopathy. Upper chest: 5 mm right upper lobe nodule (series 5, image 146), indeterminate. Visualized upper chest otherwise unremarkable without acute abnormality. Review of the MIP images confirms the above findings  CTA HEAD FINDINGS Anterior circulation: Petrous segments widely patent bilaterally. Scattered atheromatous plaque within the cavernous/supraclinoid ICAs with associated mild to moderate multifocal narrowing, most pronounced at the para clinoid right ICA. ICA termini well perfused. A1 segments patent bilaterally. Normal anterior communicating artery. Anterior cerebral arteries widely patent to their distal aspects. No M1 stenosis or occlusion. Normal MCA bifurcations. Distal MCA branches well perfused and symmetric. Posterior circulation: Minimal non stenotic plaque noted within the proximal right V4 segment without stenosis. Vertebral arteries otherwise widely patent to the vertebrobasilar junction. Both picas patent proximally. Basilar patent to its distal aspect without stenosis. Superior cerebral arteries patent bilaterally. Left PCA supplied via the basilar. Fetal type origin of the right PCA. Both PCAs well perfused to their distal aspects. Venous sinuses: Grossly patent allowing for timing the contrast bolus. Anatomic variants: Fetal type right PCA.  No intracranial aneurysm. Review of the MIP images confirms the above findings IMPRESSION: 1. Negative CTA for large vessel occlusion or other acute vascular abnormality. 2. Atheromatous change about the carotid siphons with associated mild to moderate multifocal narrowing, greater on the right. 3. No other hemodynamically significant or correctable stenosis about the major arterial vasculature of the head and neck. 4. 5 mm right upper lobe nodule, indeterminate. No follow-up needed if patient is low-risk. Non-contrast chest CT can be considered in 12 months if patient is high-risk. This recommendation follows the consensus statement: Guidelines for Management of Incidental Pulmonary Nodules Detected on CT Images: From the Fleischner Society 2017; Radiology 2017; 284:228-243. Electronically Signed   By: Jeannine Boga M.D.   On: 08/01/2019 03:55   MR BRAIN  WO CONTRAST  Result Date: 07/31/2019 CLINICAL DATA:  Worsening dizziness EXAM: MRI HEAD WITHOUT CONTRAST TECHNIQUE: Multiplanar, multiecho pulse sequences of the brain and surrounding structures were obtained without intravenous contrast. COMPARISON:  None. FINDINGS: Brain: Small subcentimeter foci of restricted diffusion are present within the right cerebellar hemisphere. There is no evidence of intracranial hemorrhage. Patchy small foci of T2 hyperintensity in the supratentorial white matter are nonspecific but may reflect minor chronic microvascular ischemic changes. There is no intracranial mass, mass effect, or edema. There is no hydrocephalus or extra-axial fluid collection. Ventricles and sulci are within normal limits in size and configuration, noting presence of a cavum septum pellucidum et vergae. Vascular: Major vessel flow voids at the skull base are preserved. Skull and upper cervical spine: Normal marrow signal is preserved. Sinuses/Orbits: Minor mucosal thickening.  Orbits are unremarkable. Other: Sella is unremarkable.  Mastoid air cells are clear. IMPRESSION: Multiple small acute right cerebellar infarcts. Minor chronic microvascular ischemic changes. Electronically Signed   By: Macy Mis M.D.   On: 07/31/2019 16:51   ECHOCARDIOGRAM COMPLETE  Result Date: 08/01/2019    ECHOCARDIOGRAM REPORT   Patient Name:   [name] Date of Exam: 08/01/2019 Medical Rec #:  [id number]          Height:       61.0 in Accession #:    [id number]         Weight:       204.0 lb Date of Birth:  06-26-1945          BSA:          1.905 m Patient Age:    40 years  BP:           148/54 mmHg Patient Gender: F                  HR:           61 bpm. Exam Location:  Inpatient Procedure: 2D Echo, Cardiac Doppler and Color Doppler Indications:    Stroke 434.91 / I163.9  History:        Patient has no prior history of Echocardiogram examinations.                 Signs/Symptoms:Chest Pain; Risk  Factors:Hypertension, Diabetes,                 Dyslipidemia and Non-Smoker. GERD.  Sonographer:    Vickie Epley RDCS Referring Phys: [id number] [place]  1. Left ventricular ejection fraction, by estimation, is 60 to 65%. The left ventricle has normal function. The left ventricle has no regional wall motion abnormalities. Left ventricular diastolic parameters are consistent with Grade I diastolic dysfunction (impaired relaxation). Elevated left atrial pressure.  2. Right ventricular systolic function is normal. The right ventricular size is normal. There is normal pulmonary artery systolic pressure. The estimated right ventricular systolic pressure is Q000111Q mmHg.  3. The mitral valve is normal in structure. Trivial mitral valve regurgitation. No evidence of mitral stenosis.  4. Tricuspid valve regurgitation is moderate.  5. The aortic valve is normal in structure. Aortic valve regurgitation is not visualized. No aortic stenosis is present.  6. The inferior vena cava is normal in size with greater than 50% respiratory variability, suggesting right atrial pressure of 3 mmHg. Conclusion(s)/Recommendation(s): No intracardiac source of embolism detected on this transthoracic study. A transesophageal echocardiogram is recommended to exclude cardiac source of embolism if clinically indicated. FINDINGS  Left Ventricle: Left ventricular ejection fraction, by estimation, is 60 to 65%. The left ventricle has normal function. The left ventricle has no regional wall motion abnormalities. The left ventricular internal cavity size was normal in size. There is  no left ventricular hypertrophy. Left ventricular diastolic parameters are consistent with Grade I diastolic dysfunction (impaired relaxation). Elevated left atrial pressure. Right Ventricle: The right ventricular size is normal. No increase in right ventricular wall thickness. Right ventricular systolic function is normal. There is normal pulmonary artery  systolic pressure. The tricuspid regurgitant velocity is 2.60 m/s, and  with an assumed right atrial pressure of 3 mmHg, the estimated right ventricular systolic pressure is Q000111Q mmHg. Left Atrium: Left atrial size was normal in size. Right Atrium: Right atrial size was normal in size. Pericardium: Trivial pericardial effusion is present. The pericardial effusion is posterior to the left ventricle. Mitral Valve: The mitral valve is normal in structure. Normal mobility of the mitral valve leaflets. Moderate mitral annular calcification. Trivial mitral valve regurgitation. No evidence of mitral valve stenosis. Tricuspid Valve: The tricuspid valve is normal in structure. Tricuspid valve regurgitation is moderate . No evidence of tricuspid stenosis. Aortic Valve: The aortic valve is normal in structure. Aortic valve regurgitation is not visualized. No aortic stenosis is present. Pulmonic Valve: The pulmonic valve was normal in structure. Pulmonic valve regurgitation is trivial. No evidence of pulmonic stenosis. Aorta: The aortic root is normal in size and structure. Venous: The inferior vena cava is normal in size with greater than 50% respiratory variability, suggesting right atrial pressure of 3 mmHg. IAS/Shunts: No atrial level shunt detected by color flow Doppler.  LEFT VENTRICLE PLAX 2D LVIDd:  4.70 cm      Diastology LVIDs:         2.80 cm      LV e' lateral:   5.44 cm/s LV PW:         0.80 cm      LV E/e' lateral: 21.0 LV IVS:        0.80 cm      LV e' medial:    3.92 cm/s LVOT diam:     1.80 cm      LV E/e' medial:  29.1 LV SV:         64 LV SV Index:   34 LVOT Area:     2.54 cm  LV Volumes (MOD) LV vol d, MOD A2C: 102.0 ml LV vol d, MOD A4C: 87.6 ml LV vol s, MOD A2C: 32.1 ml LV vol s, MOD A4C: 30.5 ml LV SV MOD A2C:     69.9 ml LV SV MOD A4C:     87.6 ml LV SV MOD BP:      66.6 ml RIGHT VENTRICLE RV S prime:     18.20 cm/s TAPSE (M-mode): 2.3 cm LEFT ATRIUM             Index       RIGHT ATRIUM            Index LA diam:        3.80 cm 2.00 cm/m  RA Area:     11.50 cm LA Vol (A2C):   40.7 ml 21.37 ml/m RA Volume:   24.20 ml  12.71 ml/m LA Vol (A4C):   30.8 ml 16.17 ml/m LA Biplane Vol: 35.9 ml 18.85 ml/m  AORTIC VALVE LVOT Vmax:   102.00 cm/s LVOT Vmean:  71.800 cm/s LVOT VTI:    0.251 m  AORTA Ao Root diam: 2.80 cm MITRAL VALVE                TRICUSPID VALVE MV Area (PHT): 2.87 cm     TR Peak grad:   27.0 mmHg MV Decel Time: 264 msec     TR Vmax:        260.00 cm/s MV E velocity: 114.00 cm/s MV A velocity: 121.00 cm/s  SHUNTS MV E/A ratio:  0.94         Systemic VTI:  0.25 m                             Systemic Diam: 1.80 cm Candee Furbish MD Electronically signed by Candee Furbish MD Signature Date/Time: 08/01/2019/11:48:52 AM    Final    VAS [place] LOWER EXTREMITY VENOUS (DVT)  Result Date: 08/01/2019  Lower Venous DVTStudy Indications: Stroke.  Comparison Study: No prior study Performing Technologist: Maudry Mayhew MHA, RDMS, RVT, RDCS  Examination Guidelines: A complete evaluation includes B-mode imaging, spectral Doppler, color Doppler, and power Doppler as needed of all accessible portions of each vessel. Bilateral testing is considered an integral part of a complete examination. Limited examinations for reoccurring indications may be performed as noted. The reflux portion of the exam is performed with the patient in reverse Trendelenburg.  +---------+---------------+---------+-----------+----------+--------------+ RIGHT    CompressibilityPhasicitySpontaneityPropertiesThrombus Aging +---------+---------------+---------+-----------+----------+--------------+ CFV      Full           Yes      Yes                                 +---------+---------------+---------+-----------+----------+--------------+  SFJ      Full                                                        +---------+---------------+---------+-----------+----------+--------------+ FV Prox  Full                                                         +---------+---------------+---------+-----------+----------+--------------+ FV Mid   Full                                                        +---------+---------------+---------+-----------+----------+--------------+ FV DistalFull                                                        +---------+---------------+---------+-----------+----------+--------------+ PFV      Full                                                        +---------+---------------+---------+-----------+----------+--------------+ POP      Full           Yes      Yes                                 +---------+---------------+---------+-----------+----------+--------------+ PTV      Full                                                        +---------+---------------+---------+-----------+----------+--------------+ PERO     Full                                                        +---------+---------------+---------+-----------+----------+--------------+   +---------+---------------+---------+-----------+----------+--------------+ LEFT     CompressibilityPhasicitySpontaneityPropertiesThrombus Aging +---------+---------------+---------+-----------+----------+--------------+ CFV      Full           Yes      Yes                                 +---------+---------------+---------+-----------+----------+--------------+ SFJ      Full                                                        +---------+---------------+---------+-----------+----------+--------------+  FV Prox  Full                                                        +---------+---------------+---------+-----------+----------+--------------+ FV Mid   Full                                                        +---------+---------------+---------+-----------+----------+--------------+ FV DistalFull                                                         +---------+---------------+---------+-----------+----------+--------------+ PFV      Full                                                        +---------+---------------+---------+-----------+----------+--------------+ POP      Full           Yes      Yes                                 +---------+---------------+---------+-----------+----------+--------------+ PTV      Full                                                        +---------+---------------+---------+-----------+----------+--------------+ PERO     Full                                                        +---------+---------------+---------+-----------+----------+--------------+     Summary: RIGHT: - There is no evidence of deep vein thrombosis in the lower extremity.  - No cystic structure found in the popliteal fossa.  LEFT: - There is no evidence of deep vein thrombosis in the lower extremity.  - No cystic structure found in the popliteal fossa.  *See table(s) above for measurements and observations.    Preliminary     PHYSICAL EXAM  Temp:  [98 F (36.7 C)-98.6 F (37 C)] 98.6 F (37 C) (05/05 1550) Pulse Rate:  [61-74] 63 (05/05 1550) Resp:  [10-25] 16 (05/05 1550) BP: (125-176)/(37-70) 152/54 (05/05 1550) SpO2:  [95 %-100 %] 97 % (05/05 1550)  General - Well nourished, well developed, in no apparent distress.  Ophthalmologic - fundi not visualized due to noncooperation.  Cardiovascular - Regular rhythm and rate.  Mental Status -  Level of arousal and orientation to time, place, and person were intact. Language including expression, naming, repetition, comprehension was assessed and found intact. Fund of Knowledge was assessed  and was intact.  Cranial Nerves II - XII - II - Visual field intact OU. III, IV, VI - Extraocular movements intact. V - Facial sensation intact bilaterally. VII - Facial movement intact bilaterally. VIII - Hearing & vestibular intact bilaterally. X - Palate  elevates symmetrically. XI - Chin turning & shoulder shrug intact bilaterally. XII - Tongue protrusion intact.  Motor Strength - The patient's strength was normal in all extremities and pronator drift was absent.  Bulk was normal and fasciculations were absent.   Motor Tone - Muscle tone was assessed at the neck and appendages and was normal.  Reflexes - The patient's reflexes were symmetrical in all extremities and she had no pathological reflexes.  Sensory - Light touch, temperature/pinprick were assessed and were symmetrical.    Coordination - The patient had normal movements in the hands and feet with no ataxia or dysmetria.  Tremor was absent.  Gait and Station - deferred.   ASSESSMENT/PLAN [name] is a 74 y.o. female with history of diabetes, obesity, hypertension, hyperlipidemia, off-and-on dizziness for many months to years presenting with unresolved dizziness.   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  CT head No acute abnormality. Atrophy.   MRI  Multiple small R cerebellar infarcts. Minor small vessel disease.   CTA head & neck no LVO. R>L ICA siphon mild to moderate multifocal narrowing.    LE Doppler  No DVT  2D Echo EF 60-65%. No source of embolus   A [facility] [address] electrophysiologist will consult and consider placement of an implantable loop recorder to evaluate for atrial fibrillation as etiology of stroke. This has been explained to patient/family by Dr. Erlinda Hong and they are agreeable.   LDL 118  HgbA1c 10.4  SCDs DVT for VTE prophylaxis  No antithrombotic prior to admission, now on aspirin 81 mg daily and clopidogrel 75 mg daily. Continue DAPT x 3 weeks then aspirin alone    Therapy recommendations:  OP PT  Disposition:  Return home  [place] for d/c following loop placement from stroke perspective Follow-up Stroke Clinic at [facility] in 4 weeks. Office will call  with appointment date and time. Order placed.  Hypertension  Stable . Permissive hypertension (OK if < 220/120) but gradually normalize in 5-7 days . Long-term BP goal normotensive  Hyperlipidemia  Home meds:  No statin  Now on Lipitor 40  LDL 118, goal < 70  Continue statin at discharge  Diabetes type II Uncontrolled  HgbA1c 10.4, goal < 7.0  CBGs  SSI  Close PCP follow-up for better DM control  Other Stroke Risk Factors  Advanced age  Morbid Obesity, There is no height or weight on file to calculate BMI., recommend weight loss, diet and exercise as appropriate   Family hx stroke (mother, brother)  Other Active Problems Leukocytosis WBC 12.3  Hospital day # 1  Neurology will sign off. Please call with questions. Pt will follow up with stroke clinic NP at [facility] in about 4 weeks. Thanks for the consult.  Rosalin Hawking, MD PhD Stroke Neurology 08/01/2019 4:38 PM  To contact Stroke Continuity provider, please refer to [email or web address] After hours, contact General Neurology

## 2019-08-02 LAB — GLUCOSE, CAPILLARY
Glucose-Capillary: 218 mg/dL — ABNORMAL HIGH (ref 70–99)
Glucose-Capillary: 270 mg/dL — ABNORMAL HIGH (ref 70–99)

## 2019-08-02 MED ORDER — NOVOLIN N FLEXPEN RELION 100 UNIT/ML ~~LOC~~ SUPN: 10.0000 [IU] | PEN_INJECTOR | Freq: Two times a day (BID) | SUBCUTANEOUS | 11 refills | Status: DC

## 2019-08-02 MED ORDER — CARVEDILOL 3.125 MG PO TABS: 3.1250 mg | ORAL_TABLET | Freq: Two times a day (BID) | ORAL | 2 refills | Status: DC

## 2019-08-02 MED ORDER — "RELION INSULIN SYRINGE 29G X 1/2"" 0.3 ML MISC": 1.0000 | Freq: Two times a day (BID) | 0 refills | Status: AC

## 2019-08-02 MED ORDER — ASPIRIN 81 MG PO TBEC: 81.0000 mg | DELAYED_RELEASE_TABLET | Freq: Every day | ORAL | 2 refills | Status: DC

## 2019-08-02 MED ORDER — ATORVASTATIN CALCIUM 40 MG PO TABS: 40.0000 mg | ORAL_TABLET | Freq: Every day | ORAL | 2 refills | Status: AC

## 2019-08-02 MED ORDER — BLOOD GLUCOSE MONITOR KIT: PACK | 0 refills | Status: AC

## 2019-08-02 MED ORDER — CLOPIDOGREL BISULFATE 75 MG PO TABS: 75.0000 mg | ORAL_TABLET | Freq: Every day | ORAL | 0 refills | Status: DC

## 2019-08-02 NOTE — Progress Notes (Signed)
Patient IV removed without complications. Personal belongings collected from the room by patient. This author checked closet and drawers no personal belongings in those areas. Discharge instructions reviewed with patient and patient verbalized understanding, all questions were answered. Patient discharged via wheelchair to private vehicle driven by patient's son.

## 2019-08-02 NOTE — Progress Notes (Signed)
Inpatient Diabetes Program Recommendations  AACE/ADA: New Consensus Statement on Inpatient Glycemic Control (2015)  Target Ranges:  Prepandial:   less than 140 mg/dL      Peak postprandial:   less than 180 mg/dL (1-2 hours)      Critically ill patients:  140 - 180 mg/dL   Lab Results  Component Value Date   GLUCAP 270 (H) 08/02/2019   HGBA1C 10.4 (H) 08/01/2019    Review of Glycemic Control Results for [name], [name] (MRN [id number]) as of 08/02/2019 11:29  Ref. Range 08/01/2019 12:19 08/01/2019 15:51 08/01/2019 21:27 08/02/2019 06:11 08/02/2019 11:14  Glucose-Capillary Latest Ref Range: 70 - 99 mg/dL 183 (H) 239 (H) 221 (H) 218 (H) 270 (H)   Diabetes history: DM 2 Outpatient Diabetes medications:  Lantus 50 units daily, Victoza 1.2 mg daily- PATIENT NOT TAKING DUE TO COST Actos 30 mg daily Current orders for Inpatient glycemic control:  Novolog moderate tid with meals  Inpatient Diabetes Program Recommendations:    Per MD patient is unable to afford DM medications and thus she has not taken since December 2020.  Called and discussed with patient.  She states that she went to pick up medicine from pharmacy in December, and the cost was 800$.  She states "it would take my entire social security check to pay this".  It appears that Lantus is on formulary, however ? If she was in the doughnut hole.  There is also no generic for Victoza at this time.      For now, recommend NPH (RELI-ON Insulin pens) 10 units bid.  Discussed the differences between NPH and Lantus and that dose if given BID.  Also discussed that NPH will peak, so it is important to eat consistently.  Patient states "I eat whatever I want" however she does only drink water.  Discussed goal blood sugars and the importance of glycemic control.  Encouraged her to check her blood sugars tid with meals and HS.  Discussed goal blood sugars (80-120 fasting and less than 180 mg/dl post-meal).  We also reviewed signs and symptoms of low blood  sugars and treatment.  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 She now always has snack and juice with her.  Encouraged close f/u with PCP.  Patient verbalized understanding.   Thanks  Adah Perl, RN, BC-ADM Inpatient Diabetes Coordinator Pager [phone] (8a-5p)

## 2019-08-02 NOTE — Discharge Summary (Signed)
Physician Discharge Summary  [name] GGE:[id number] DOB: 1945-09-09 DOA: 07/31/2019  PCP: Orpah Melter, MD  Admit date: 07/31/2019 Discharge date: 08/02/2019  Admitted From: -Home Disposition: Home  Recommendations for Outpatient Follow-up:  1. Follow up with PCP in 1-2 weeks 2. Please obtain BMP/CBC in one week 3. Keep your blood sugar log at home, bring it to primary care physician's office. 4. You have been prescribed generic medications, start taking.  Discharge Condition: Stable CODE STATUS: Full code Diet recommendation: Low-salt, low-carb diet  Discharge summary:  Patient with history of type 2 diabetes, uncontrolled with hyperglycemia currently not taking any treatment, hypertension, not taking any medication since at least Christmas time presented to the emergency room with loss of balance at least ongoing for the last so many days. In the emergency room hemodynamically stable.  MRI with acute right cerebellar stroke.  Hemoglobin A1c more than 10.  Treated for acute thrombotic stroke.  Further plan as below.  Acute ischemic stroke: Multiple right cerebellar stroke: Clinical findings, dizziness and gait instability. CT head findings, no acute findings. MRI of the brain, multiple small acute right cerebellar infarcts.  Minor chronic ischemic changes. CTA of the head and neck: Negative for large vessel occlusion.  Atherosclerotic changes. 2D echocardiogram, normal. Antiplatelet therapy, not on any treatment at home.  Currently on aspirin and Plavix. Plan is for aspirin and Plavix for 21 days, then aspirin alone. LDL 118.  Started on high-dose statin.  Minor intolerance to previous a statin.  We will continue Lipitor. Hemoglobin A1c, 10.4.    Not on treatment at home.  See below for treatment options. Therapy recommendations, outpatient therapies Received implantable loop recorder for event monitoring.  Essential hypertension: Fairly stable.    Resume home  medications including beta-blockers and lisinopril.  Hyperlipidemia: Not on treatment at home.  LDL 118.  Goal less than 70.  Started on atorvastatin.  Type 2 diabetes, uncontrolled with hyperglycemia: Apparently not taking any medicine including insulin or Victoza since Christmas 2020 due to coverage issue.  Hemoglobin A1c is 10.  Goal is less than 7. Discussed in detail.  Different options discussed including generic insulin options.  Seen by diabetic educator. Prescribed NPH pen , 10 units twice daily.this is generic medication.  Continue Actos.  She will also start keeping a log of blood sugars at home and bring it to her primary care physician's office for follow-up.  She may need further escalation of doses depends upon her blood sugars.  She is able to go home today with family.  Referral for outpatient rehab.     Discharge Diagnoses:  Principal Problem:   Acute cerebrovascular accident of cerebellum ([place]) Active Problems:   Essential hypertension   Hyperlipidemia   DM (diabetes mellitus), type 2 with complications ([place])   Morbid obesity ([place])   GERD (gastroesophageal reflux disease)   Leukocytosis    Discharge Instructions  Discharge Instructions    Ambulatory referral to Neurology   Complete by: As directed    Follow up in stroke clinic at [facility] with Frann Rider, NP in about 4 weeks. If not available, consider Dr. Antony Contras, Dr. Bess Harvest, or Dr. Sarina Ill.   Ambulatory referral to Physical Therapy   Complete by: As directed    Call MD for:  persistant dizziness or light-headedness   Complete by: As directed    Diet - low sodium heart healthy   Complete by: As directed    Diet Carb Modified   Complete by:  As directed    Discharge instructions   Complete by: As directed    Start taking new prescription of insulin.  Close follow-up of blood sugars at home. If you have medication not covered by your insurance company,  discussed with your primary care physician for alternatives.   Increase activity slowly   Complete by: As directed      Allergies as of 08/02/2019      Reactions   Livalo [pitavastatin]    MYALGIAS   Sulfa Antibiotics Itching   Itching and red spots.      Medication List    STOP taking these medications   Lantus SoloStar 100 UNIT/ML Solostar Pen Generic drug: insulin glargine   Victoza 18 MG/3ML Sopn Generic drug: liraglutide     TAKE these medications   Aleve PM 220-25 MG Tabs Generic drug: Naproxen Sod-diphenhydrAMINE Take 2 tablets by mouth at bedtime as needed (sleep).   aspirin 81 MG EC tablet Take 1 tablet (81 mg total) by mouth daily. Start taking on: Aug 03, 2019   atorvastatin 40 MG tablet Commonly known as: LIPITOR Take 1 tablet (40 mg total) by mouth daily. Start taking on: Aug 03, 2019   blood glucose meter kit and supplies Kit Dispense based on patient and insurance preference. Use up to four times daily as directed. (FOR ICD-9 250.00, 250.01).   carvedilol 3.125 MG tablet Commonly known as: COREG Take 1 tablet (3.125 mg total) by mouth 2 (two) times daily.   clopidogrel 75 MG tablet Commonly known as: PLAVIX Take 1 tablet (75 mg total) by mouth daily for 21 days. Start taking on: Aug 03, 2019   lisinopril-hydrochlorothiazide 20-12.5 MG tablet Commonly known as: ZESTORETIC Take 1 tablet by mouth daily.   nitroGLYCERIN 0.4 MG SL tablet Commonly known as: NITROSTAT Place 1 tablet (0.4 mg total) under the tongue every 5 (five) minutes as needed for chest pain.   NovoLIN N FlexPen ReliOn 100 UNIT/ML Kiwkpen Generic drug: Insulin NPH (Human) (Isophane) Inject 10 Units into the skin 2 (two) times daily at 8 am and 10 pm.   pioglitazone 30 MG tablet Commonly known as: ACTOS Take 30 mg by mouth every evening.   RELION INSULIN SYR .3CC/29G 29G X 1/2" 0.3 ML Misc Generic drug: Insulin Syringe-Needle U-100 1 each by Does not apply route in the morning  and at bedtime.      Follow-up Information    [facility] up.   Specialty: Rehabilitation Why: The outpatient rehab will contact you for the first appointment Contact information: [place] [phone]       [facility] Follow up.   Specialty: Cardiology Why: 08/09/2019 @ 12:30PM, wound check visit Contact information: [address], [place] [address] [phone]       [facility] Follow up in 4 week(s).   Specialty: Neurology Why: stroke clinic. office will call with appt date and time.  Contact information: [place] [phone]         Allergies  Allergen Reactions  . Livalo [Pitavastatin]     MYALGIAS   . Sulfa Antibiotics Itching    Itching and red spots.    Consultations:  EP cardiology  Neurology  Rehab   Procedures/Studies: CT ANGIO HEAD W OR WO CONTRAST  Result Date: 08/01/2019 CLINICAL DATA:  Follow-up examination for acute stroke. EXAM: CT ANGIOGRAPHY HEAD  AND NECK TECHNIQUE: Multidetector CT imaging of the head and neck was performed using the standard protocol during bolus administration of intravenous contrast. Multiplanar CT image reconstructions and MIPs were obtained to evaluate the vascular anatomy. Carotid stenosis measurements (when applicable) are obtained utilizing NASCET criteria, using the distal internal carotid diameter as the denominator. CONTRAST:  79m OMNIPAQUE IOHEXOL 350 MG/ML SOLN COMPARISON:  Prior MRI from 07/31/2019. FINDINGS: CTA NECK FINDINGS Aortic arch: Partially visualized aortic arch of normal caliber with normal branch pattern. Origin of the great vessels incompletely evaluated on this exam. Mild atheromatous plaque within the arch itself. Visualized subclavian arteries widely patent. Right  carotid system: Right common and internal carotid arteries widely patent without stenosis, dissection, or occlusion. Left carotid system: Left common and internal carotid arteries widely patent without stenosis, dissection or occlusion. Vertebral arteries: Left vertebral artery hypoplastic and arises directly from the aortic arch. Strongly dominant right vertebral artery. Vertebral arteries widely patent without stenosis, dissection or occlusion. Skeleton: No acute osseous abnormality. No discrete or worrisome osseous lesions. Moderate cervical spondylosis noted at C5-6 and C6-7, with grade 1 anterolisthesis of C4 on C5. Other neck: No other acute soft tissue abnormality within the neck. No mass lesion or adenopathy. Upper chest: 5 mm right upper lobe nodule (series 5, image 146), indeterminate. Visualized upper chest otherwise unremarkable without acute abnormality. Review of the MIP images confirms the above findings CTA HEAD FINDINGS Anterior circulation: Petrous segments widely patent bilaterally. Scattered atheromatous plaque within the cavernous/supraclinoid ICAs with associated mild to moderate multifocal narrowing, most pronounced at the para clinoid right ICA. ICA termini well perfused. A1 segments patent bilaterally. Normal anterior communicating artery. Anterior cerebral arteries widely patent to their distal aspects. No M1 stenosis or occlusion. Normal MCA bifurcations. Distal MCA branches well perfused and symmetric. Posterior circulation: Minimal non stenotic plaque noted within the proximal right V4 segment without stenosis. Vertebral arteries otherwise widely patent to the vertebrobasilar junction. Both picas patent proximally. Basilar patent to its distal aspect without stenosis. Superior cerebral arteries patent bilaterally. Left PCA supplied via the basilar. Fetal type origin of the right PCA. Both PCAs well perfused to their distal aspects. Venous sinuses: Grossly patent allowing for timing the  contrast bolus. Anatomic variants: Fetal type right PCA.  No intracranial aneurysm. Review of the MIP images confirms the above findings IMPRESSION: 1. Negative CTA for large vessel occlusion or other acute vascular abnormality. 2. Atheromatous change about the carotid siphons with associated mild to moderate multifocal narrowing, greater on the right. 3. No other hemodynamically significant or correctable stenosis about the major arterial vasculature of the head and neck. 4. 5 mm right upper lobe nodule, indeterminate. No follow-up needed if patient is low-risk. Non-contrast chest CT can be considered in 12 months if patient is high-risk. This recommendation follows the consensus statement: Guidelines for Management of Incidental Pulmonary Nodules Detected on CT Images: From the Fleischner Society 2017; Radiology 2017; 284:228-243. Electronically Signed   By: BJeannine BogaM.D.   On: 08/01/2019 03:55   CT Head Wo Contrast  Result Date: 07/31/2019 CLINICAL DATA:  Dizziness EXAM: CT HEAD WITHOUT CONTRAST TECHNIQUE: Contiguous axial images were obtained from the base of the skull through the vertex without intravenous contrast. COMPARISON:  None. FINDINGS: Brain: No evidence of acute infarction, hemorrhage, hydrocephalus, extra-axial collection or mass lesion/mass effect. Cavum septum pellucidum. Mild cerebral volume loss. Vascular: Atherosclerotic calcifications involving the large vessels of the skull base. No unexpected hyperdense vessel. Skull: Normal. Negative for fracture or  focal lesion. Sinuses/Orbits: No acute finding. Other: None. IMPRESSION: 1. No acute intracranial findings. 2. Mild age related cerebral volume loss. Electronically Signed   By: Davina Poke D.O.   On: 07/31/2019 14:11   CT ANGIO NECK W OR WO CONTRAST  Result Date: 08/01/2019 CLINICAL DATA:  Follow-up examination for acute stroke. EXAM: CT ANGIOGRAPHY HEAD AND NECK TECHNIQUE: Multidetector CT imaging of the head and neck was  performed using the standard protocol during bolus administration of intravenous contrast. Multiplanar CT image reconstructions and MIPs were obtained to evaluate the vascular anatomy. Carotid stenosis measurements (when applicable) are obtained utilizing NASCET criteria, using the distal internal carotid diameter as the denominator. CONTRAST:  92m OMNIPAQUE IOHEXOL 350 MG/ML SOLN COMPARISON:  Prior MRI from 07/31/2019. FINDINGS: CTA NECK FINDINGS Aortic arch: Partially visualized aortic arch of normal caliber with normal branch pattern. Origin of the great vessels incompletely evaluated on this exam. Mild atheromatous plaque within the arch itself. Visualized subclavian arteries widely patent. Right carotid system: Right common and internal carotid arteries widely patent without stenosis, dissection, or occlusion. Left carotid system: Left common and internal carotid arteries widely patent without stenosis, dissection or occlusion. Vertebral arteries: Left vertebral artery hypoplastic and arises directly from the aortic arch. Strongly dominant right vertebral artery. Vertebral arteries widely patent without stenosis, dissection or occlusion. Skeleton: No acute osseous abnormality. No discrete or worrisome osseous lesions. Moderate cervical spondylosis noted at C5-6 and C6-7, with grade 1 anterolisthesis of C4 on C5. Other neck: No other acute soft tissue abnormality within the neck. No mass lesion or adenopathy. Upper chest: 5 mm right upper lobe nodule (series 5, image 146), indeterminate. Visualized upper chest otherwise unremarkable without acute abnormality. Review of the MIP images confirms the above findings CTA HEAD FINDINGS Anterior circulation: Petrous segments widely patent bilaterally. Scattered atheromatous plaque within the cavernous/supraclinoid ICAs with associated mild to moderate multifocal narrowing, most pronounced at the para clinoid right ICA. ICA termini well perfused. A1 segments patent  bilaterally. Normal anterior communicating artery. Anterior cerebral arteries widely patent to their distal aspects. No M1 stenosis or occlusion. Normal MCA bifurcations. Distal MCA branches well perfused and symmetric. Posterior circulation: Minimal non stenotic plaque noted within the proximal right V4 segment without stenosis. Vertebral arteries otherwise widely patent to the vertebrobasilar junction. Both picas patent proximally. Basilar patent to its distal aspect without stenosis. Superior cerebral arteries patent bilaterally. Left PCA supplied via the basilar. Fetal type origin of the right PCA. Both PCAs well perfused to their distal aspects. Venous sinuses: Grossly patent allowing for timing the contrast bolus. Anatomic variants: Fetal type right PCA.  No intracranial aneurysm. Review of the MIP images confirms the above findings IMPRESSION: 1. Negative CTA for large vessel occlusion or other acute vascular abnormality. 2. Atheromatous change about the carotid siphons with associated mild to moderate multifocal narrowing, greater on the right. 3. No other hemodynamically significant or correctable stenosis about the major arterial vasculature of the head and neck. 4. 5 mm right upper lobe nodule, indeterminate. No follow-up needed if patient is low-risk. Non-contrast chest CT can be considered in 12 months if patient is high-risk. This recommendation follows the consensus statement: Guidelines for Management of Incidental Pulmonary Nodules Detected on CT Images: From the Fleischner Society 2017; Radiology 2017; 284:228-243. Electronically Signed   By: BJeannine BogaM.D.   On: 08/01/2019 03:55   MR BRAIN WO CONTRAST  Result Date: 07/31/2019 CLINICAL DATA:  Worsening dizziness EXAM: MRI HEAD WITHOUT CONTRAST TECHNIQUE: Multiplanar, multiecho  pulse sequences of the brain and surrounding structures were obtained without intravenous contrast. COMPARISON:  None. FINDINGS: Brain: Small subcentimeter foci  of restricted diffusion are present within the right cerebellar hemisphere. There is no evidence of intracranial hemorrhage. Patchy small foci of T2 hyperintensity in the supratentorial white matter are nonspecific but may reflect minor chronic microvascular ischemic changes. There is no intracranial mass, mass effect, or edema. There is no hydrocephalus or extra-axial fluid collection. Ventricles and sulci are within normal limits in size and configuration, noting presence of a cavum septum pellucidum et vergae. Vascular: Major vessel flow voids at the skull base are preserved. Skull and upper cervical spine: Normal marrow signal is preserved. Sinuses/Orbits: Minor mucosal thickening.  Orbits are unremarkable. Other: Sella is unremarkable.  Mastoid air cells are clear. IMPRESSION: Multiple small acute right cerebellar infarcts. Minor chronic microvascular ischemic changes. Electronically Signed   By: Macy Mis M.D.   On: 07/31/2019 16:51   EP PPM/ICD IMPLANT  Result Date: 08/01/2019 SURGEON:  Allegra Lai, MD   PREPROCEDURE DIAGNOSIS:  Cryptogenic Stroke   POSTPROCEDURE DIAGNOSIS:  Cryptogenic Stroke    PROCEDURES:  1. Implantable loop recorder implantation   INTRODUCTION:  [name] is a 74 y.o. female with a history of unexplained stroke who presents today for implantable loop implantation.  The patient has had a cryptogenic stroke.  Despite an extensive workup by neurology, no reversible causes have been identified.  she has worn telemetry during which she did not have arrhythmias.  There is significant concern for possible atrial fibrillation as the cause for the patients stroke.  The patient therefore presents today for implantable loop implantation.   DESCRIPTION OF PROCEDURE:  Informed written consent was obtained, and the patient was brought to the electrophysiology lab in a fasting state.  The patient required no sedation for the procedure today.  Mapping over the patient's chest was  performed by the EP lab staff to identify the area where electrograms were most prominent for ILR recording.  This area was found to be the left parasternal region over the 3rd-4th intercostal space. The patients left chest was therefore prepped and draped in the usual sterile fashion by the EP lab staff. The skin overlying the left parasternal region was infiltrated with lidocaine for local analgesia.  A 0.5-cm incision was made over the left parasternal region over the 3rd intercostal space.  A subcutaneous ILR pocket was fashioned using a combination of sharp and blunt dissection.  A Medtronic Reveal [place] model [id number] implantable loop recorder was then placed into the pocket  R waves were very prominent and measured 0.34m. EBL<1 ml.  Steri- Strips and a sterile dressing were then applied.  There were no early apparent complications.   CONCLUSIONS:  1. Successful implantation of a Medtronic Reveal LINQ implantable loop recorder for cryptogenic stroke  2. No early apparent complications.   ECHOCARDIOGRAM COMPLETE  Result Date: 08/01/2019    ECHOCARDIOGRAM REPORT   Patient Name:   Kerry OCONNORDate of Exam: 08/01/2019 Medical Rec #:  [id number]         Height:       61.0 in Accession #:    [id number]        Weight:       204.0 lb Date of Birth:  826-Jun-1947         BSA:          1.905 m Patient Age:    89years  BP:           148/54 mmHg Patient Gender: F                  HR:           61 bpm. Exam Location:  Inpatient Procedure: 2D Echo, Cardiac Doppler and Color Doppler Indications:    Stroke 434.91 / I163.9  History:        Patient has no prior history of Echocardiogram examinations.                 Signs/Symptoms:Chest Pain; Risk Factors:Hypertension, Diabetes,                 Dyslipidemia and Non-Smoker. GERD.  Sonographer:    Vickie Epley RDCS Referring Phys: [id number] [place]  1. Left ventricular ejection fraction, by estimation, is 60 to 65%. The left  ventricle has normal function. The left ventricle has no regional wall motion abnormalities. Left ventricular diastolic parameters are consistent with Grade I diastolic dysfunction (impaired relaxation). Elevated left atrial pressure.  2. Right ventricular systolic function is normal. The right ventricular size is normal. There is normal pulmonary artery systolic pressure. The estimated right ventricular systolic pressure is 81.1 mmHg.  3. The mitral valve is normal in structure. Trivial mitral valve regurgitation. No evidence of mitral stenosis.  4. Tricuspid valve regurgitation is moderate.  5. The aortic valve is normal in structure. Aortic valve regurgitation is not visualized. No aortic stenosis is present.  6. The inferior vena cava is normal in size with greater than 50% respiratory variability, suggesting right atrial pressure of 3 mmHg. Conclusion(s)/Recommendation(s): No intracardiac source of embolism detected on this transthoracic study. A transesophageal echocardiogram is recommended to exclude cardiac source of embolism if clinically indicated. FINDINGS  Left Ventricle: Left ventricular ejection fraction, by estimation, is 60 to 65%. The left ventricle has normal function. The left ventricle has no regional wall motion abnormalities. The left ventricular internal cavity size was normal in size. There is  no left ventricular hypertrophy. Left ventricular diastolic parameters are consistent with Grade I diastolic dysfunction (impaired relaxation). Elevated left atrial pressure. Right Ventricle: The right ventricular size is normal. No increase in right ventricular wall thickness. Right ventricular systolic function is normal. There is normal pulmonary artery systolic pressure. The tricuspid regurgitant velocity is 2.60 m/s, and  with an assumed right atrial pressure of 3 mmHg, the estimated right ventricular systolic pressure is 91.4 mmHg. Left Atrium: Left atrial size was normal in size. Right Atrium:  Right atrial size was normal in size. Pericardium: Trivial pericardial effusion is present. The pericardial effusion is posterior to the left ventricle. Mitral Valve: The mitral valve is normal in structure. Normal mobility of the mitral valve leaflets. Moderate mitral annular calcification. Trivial mitral valve regurgitation. No evidence of mitral valve stenosis. Tricuspid Valve: The tricuspid valve is normal in structure. Tricuspid valve regurgitation is moderate . No evidence of tricuspid stenosis. Aortic Valve: The aortic valve is normal in structure. Aortic valve regurgitation is not visualized. No aortic stenosis is present. Pulmonic Valve: The pulmonic valve was normal in structure. Pulmonic valve regurgitation is trivial. No evidence of pulmonic stenosis. Aorta: The aortic root is normal in size and structure. Venous: The inferior vena cava is normal in size with greater than 50% respiratory variability, suggesting right atrial pressure of 3 mmHg. IAS/Shunts: No atrial level shunt detected by color flow Doppler.  LEFT VENTRICLE PLAX 2D LVIDd:  4.70 cm      Diastology LVIDs:         2.80 cm      LV e' lateral:   5.44 cm/s LV PW:         0.80 cm      LV E/e' lateral: 21.0 LV IVS:        0.80 cm      LV e' medial:    3.92 cm/s LVOT diam:     1.80 cm      LV E/e' medial:  29.1 LV SV:         64 LV SV Index:   34 LVOT Area:     2.54 cm  LV Volumes (MOD) LV vol d, MOD A2C: 102.0 ml LV vol d, MOD A4C: 87.6 ml LV vol s, MOD A2C: 32.1 ml LV vol s, MOD A4C: 30.5 ml LV SV MOD A2C:     69.9 ml LV SV MOD A4C:     87.6 ml LV SV MOD BP:      66.6 ml RIGHT VENTRICLE RV S prime:     18.20 cm/s TAPSE (M-mode): 2.3 cm LEFT ATRIUM             Index       RIGHT ATRIUM           Index LA diam:        3.80 cm 2.00 cm/m  RA Area:     11.50 cm LA Vol (A2C):   40.7 ml 21.37 ml/m RA Volume:   24.20 ml  12.71 ml/m LA Vol (A4C):   30.8 ml 16.17 ml/m LA Biplane Vol: 35.9 ml 18.85 ml/m  AORTIC VALVE LVOT Vmax:   102.00 cm/s  LVOT Vmean:  71.800 cm/s LVOT VTI:    0.251 m  AORTA Ao Root diam: 2.80 cm MITRAL VALVE                TRICUSPID VALVE MV Area (PHT): 2.87 cm     TR Peak grad:   27.0 mmHg MV Decel Time: 264 msec     TR Vmax:        260.00 cm/s MV E velocity: 114.00 cm/s MV A velocity: 121.00 cm/s  SHUNTS MV E/A ratio:  0.94         Systemic VTI:  0.25 m                             Systemic Diam: 1.80 cm Candee Furbish MD Electronically signed by Candee Furbish MD Signature Date/Time: 08/01/2019/11:48:52 AM    Final    VAS [place] LOWER EXTREMITY VENOUS (DVT)  Result Date: 08/01/2019  Lower Venous DVTStudy Indications: Stroke.  Comparison Study: No prior study Performing Technologist: Maudry Mayhew MHA, RDMS, RVT, RDCS  Examination Guidelines: A complete evaluation includes B-mode imaging, spectral Doppler, color Doppler, and power Doppler as needed of all accessible portions of each vessel. Bilateral testing is considered an integral part of a complete examination. Limited examinations for reoccurring indications may be performed as noted. The reflux portion of the exam is performed with the patient in reverse Trendelenburg.  +---------+---------------+---------+-----------+----------+--------------+ RIGHT    CompressibilityPhasicitySpontaneityPropertiesThrombus Aging +---------+---------------+---------+-----------+----------+--------------+ CFV      Full           Yes      Yes                                 +---------+---------------+---------+-----------+----------+--------------+  SFJ      Full                                                        +---------+---------------+---------+-----------+----------+--------------+ FV Prox  Full                                                        +---------+---------------+---------+-----------+----------+--------------+ FV Mid   Full                                                         +---------+---------------+---------+-----------+----------+--------------+ FV DistalFull                                                        +---------+---------------+---------+-----------+----------+--------------+ PFV      Full                                                        +---------+---------------+---------+-----------+----------+--------------+ POP      Full           Yes      Yes                                 +---------+---------------+---------+-----------+----------+--------------+ PTV      Full                                                        +---------+---------------+---------+-----------+----------+--------------+ PERO     Full                                                        +---------+---------------+---------+-----------+----------+--------------+   +---------+---------------+---------+-----------+----------+--------------+ LEFT     CompressibilityPhasicitySpontaneityPropertiesThrombus Aging +---------+---------------+---------+-----------+----------+--------------+ CFV      Full           Yes      Yes                                 +---------+---------------+---------+-----------+----------+--------------+ SFJ      Full                                                        +---------+---------------+---------+-----------+----------+--------------+   FV Prox  Full                                                        +---------+---------------+---------+-----------+----------+--------------+ FV Mid   Full                                                        +---------+---------------+---------+-----------+----------+--------------+ FV DistalFull                                                        +---------+---------------+---------+-----------+----------+--------------+ PFV      Full                                                         +---------+---------------+---------+-----------+----------+--------------+ POP      Full           Yes      Yes                                 +---------+---------------+---------+-----------+----------+--------------+ PTV      Full                                                        +---------+---------------+---------+-----------+----------+--------------+ PERO     Full                                                        +---------+---------------+---------+-----------+----------+--------------+     Summary: RIGHT: - There is no evidence of deep vein thrombosis in the lower extremity.  - No cystic structure found in the popliteal fossa.  LEFT: - There is no evidence of deep vein thrombosis in the lower extremity.  - No cystic structure found in the popliteal fossa.  *See table(s) above for measurements and observations. Electronically signed by Harold Barban MD on 08/01/2019 at 8:41:57 PM.    Final      Subjective: Patient seen and examined.  No overnight events.  Was able to go to the bathroom and walk around with minimal problem with the balance. Today, she explains to me that she has not been taking any medications during blood pressure medicine or diabetes medicine since last Christmas. Eager to go home.  Has adequate family support.   Discharge Exam: Vitals:   08/02/19 0350 08/02/19 0800  BP: (!) 151/70 (!) 144/60  Pulse: 72 78  Resp: 18 16  Temp: 98.5 F (36.9 C) 98 F (36.7 C)  SpO2: 94% 98%   Vitals:   08/01/19 1938 08/02/19 0006 08/02/19 0350 08/02/19 0800  BP: (!) 154/58 (!) 184/75 (!) 151/70 (!) 144/60  Pulse: 75 69 72 78  Resp: _0 Temp: 98.7 F (37.1 C) 98 F (36.7 C) 98.5 F (36.9 C) 98 F (36.7 C)  TempSrc: Oral Oral Oral Oral  SpO2: 97% 96% 94% 98%    General: Pt is alert, awake, not in acute distress Cardiovascular: RRR, S1/S2 +, no rubs, no gallops Respiratory: CTA bilaterally, no wheezing, no rhonchi Abdominal: Soft, NT,  ND, bowel sounds + Extremities: no edema, no cyanosis No obvious neurological deficit on examination.    The results of significant diagnostics from this hospitalization (including imaging, microbiology, ancillary and laboratory) are listed below for reference.     Microbiology: Recent Results (from the past 240 hour(s))  SARS CORONAVIRUS 2 (TAT 6-24 HRS) Nasopharyngeal Nasopharyngeal Swab     Status: None   Collection Time: 07/31/19  9:00 PM   Specimen: Nasopharyngeal Swab  Result Value Ref [place] Status   SARS Coronavirus 2 NEGATIVE NEGATIVE Final    Comment: (NOTE) SARS-CoV-2 target nucleic acids are NOT DETECTED. The SARS-CoV-2 RNA is generally detectable in upper and lower respiratory specimens during the acute phase of infection. Negative results do not preclude SARS-CoV-2 infection, do not rule out co-infections with other pathogens, and should not be used as the sole basis for treatment or other patient management decisions. Negative results must be combined with clinical observations, patient history, and epidemiological information. The expected result is Negative. Fact Sheet for Patients: SugarRoll.be Fact Sheet for Healthcare Providers: https://www.woods-mathews.com/ This test is not yet approved or cleared by the [place] FDA and  has been authorized for detection and/or diagnosis of SARS-CoV-2 by FDA under an Emergency Use Authorization (EUA). This EUA will remain  in effect (meaning this test can be used) for the duration of the COVID-19 declaration under Section 56 4(b)(1) of the Act, 21 U.S.C. section 360bbb-3(b)(1), unless the authorization is terminated or revoked sooner. Performed at [facility], [place] [address] [place], [place] [address]      Labs: BNP (last 3 results) No results for input(s): BNP in the last 8760 hours. Basic Metabolic Panel: Recent Labs  Lab 07/31/19 1113 08/01/19 0456  NA 135  139  K 4.2 4.1  CL 99 104  CO2 22 24  GLUCOSE 392* 207*  BUN 15 17  CREATININE 0.66 0.59  CALCIUM 9.3 9.0   Liver Function Tests: Recent Labs  Lab 08/01/19 0456  AST 17  ALT 19  ALKPHOS 83  BILITOT 0.9  PROT 5.7*  ALBUMIN 2.9*   No results for input(s): LIPASE, AMYLASE in the last 168 hours. No results for input(s): AMMONIA in the last 168 hours. CBC: Recent Labs  Lab 07/31/19 1113 08/01/19 0456  WBC 12.3* 9.4  HGB 14.5 13.4  HCT 44.7 40.2  MCV 96.1 95.7  PLT 291 264   Cardiac Enzymes: No results for input(s): CKTOTAL, CKMB, CKMBINDEX, TROPONINI in the last 168 hours. BNP: Invalid input(s): POCBNP CBG: Recent Labs  Lab 08/01/19 1219 08/01/19 1551 08/01/19 2127 08/02/19 0611 08/02/19 1114  GLUCAP 183* 239* 221* 218* 270*   D-Dimer No results for input(s): DDIMER in the last 72 hours. Hgb A1c Recent Labs    08/01/19 0456  HGBA1C 10.4*   Lipid Profile Recent Labs    08/01/19 0456  CHOL 193  HDL 56  LDLCALC 118*  TRIG  97  CHOLHDL 3.4   Thyroid function studies No results for input(s): TSH, T4TOTAL, T3FREE, THYROIDAB in the last 72 hours.  Invalid input(s): FREET3 Anemia work up No results for input(s): VITAMINB12, FOLATE, FERRITIN, TIBC, IRON, RETICCTPCT in the last 72 hours. Urinalysis    Component Value Date/Time   COLORURINE YELLOW 07/31/2019 1245   APPEARANCEUR CLEAR 07/31/2019 1245   LABSPEC 1.038 (H) 07/31/2019 1245   PHURINE 6.0 07/31/2019 1245   GLUCOSEU >=500 (A) 07/31/2019 1245   HGBUR NEGATIVE 07/31/2019 1245   BILIRUBINUR NEGATIVE 07/31/2019 1245   KETONESUR 20 (A) 07/31/2019 1245   PROTEINUR NEGATIVE 07/31/2019 1245   NITRITE NEGATIVE 07/31/2019 1245   LEUKOCYTESUR NEGATIVE 07/31/2019 1245   Sepsis Labs Invalid input(s): PROCALCITONIN,  WBC,  LACTICIDVEN Microbiology Recent Results (from the past 240 hour(s))  SARS CORONAVIRUS 2 (TAT 6-24 HRS) Nasopharyngeal Nasopharyngeal Swab     Status: None   Collection Time:  07/31/19  9:00 PM   Specimen: Nasopharyngeal Swab  Result Value Ref [place] Status   SARS Coronavirus 2 NEGATIVE NEGATIVE Final    Comment: (NOTE) SARS-CoV-2 target nucleic acids are NOT DETECTED. The SARS-CoV-2 RNA is generally detectable in upper and lower respiratory specimens during the acute phase of infection. Negative results do not preclude SARS-CoV-2 infection, do not rule out co-infections with other pathogens, and should not be used as the sole basis for treatment or other patient management decisions. Negative results must be combined with clinical observations, patient history, and epidemiological information. The expected result is Negative. Fact Sheet for Patients: SugarRoll.be Fact Sheet for Healthcare Providers: https://www.woods-mathews.com/ This test is not yet approved or cleared by the [place] FDA and  has been authorized for detection and/or diagnosis of SARS-CoV-2 by FDA under an Emergency Use Authorization (EUA). This EUA will remain  in effect (meaning this test can be used) for the duration of the COVID-19 declaration under Section 56 4(b)(1) of the Act, 21 U.S.C. section 360bbb-3(b)(1), unless the authorization is terminated or revoked sooner. Performed at [facility], [place] [address] [place], [place] [address]      Time coordinating discharge: 40 minutes  SIGNED:   Barb Merino, MD  [facility] 08/02/2019, 11:40 AM

## 2019-08-02 NOTE — Plan of Care (Signed)
  Problem: Education: Goal: Knowledge of disease or condition will improve Outcome: Progressing Goal: Knowledge of secondary prevention will improve Outcome: Progressing Goal: Knowledge of patient specific risk factors addressed and post discharge goals established will improve Outcome: Progressing Goal: Individualized Educational Video(s) Outcome: Progressing   Problem: Coping: Goal: Will verbalize positive feelings about self Outcome: Progressing   Problem: Health Behavior/Discharge Planning: Goal: Ability to manage health-related needs will improve Outcome: Progressing   Problem: Self-Care: Goal: Ability to communicate needs accurately will improve Outcome: Progressing   Problem: Nutrition: Goal: Dietary intake will improve Outcome: Progressing

## 2019-08-02 NOTE — TOC Transition Note (Signed)
Transition of Care [facility]) - CM/SW Discharge Note   Patient Details  Name: [name] MRN: [id number] Date of Birth: March 06, 1946  Transition of Care [facility]) CM/SW Contact:  Pollie Friar, RN Phone Number: 08/02/2019, 1:34 PM   Clinical Narrative:    Pt discharging home with outpatient therapy arranged through [facility]. Information on the AVS.  Pt has transportation home.    Final next level of care: OP Rehab Barriers to Discharge: No Barriers Identified   Patient Goals and CMS Choice     Choice offered to / list presented to : Patient  Discharge Placement                       Discharge Plan and Services   Discharge Planning Services: CM Consult                                 Social Determinants of Health (SDOH) Interventions     Readmission Risk Interventions No flowsheet data found.

## 2019-08-07 ENCOUNTER — Telehealth: Payer: Self-pay

## 2019-08-07 NOTE — Telephone Encounter (Signed)
[place] alert received- 2 possible AF episodes yesterday 5/10 at 0741 and 1317.  Linq recently implanted following CVA, no documented AF as of yet.  Pt is not on [place].  Current meds include- ASA81 mg, Plavix 75mg  daily, carvedilol 3.125mg  BID.    Spoke with pt, she denies any symptoms yesterday, however today she reports she is symptomatic, her legs feels wobbly and walking is difficult.  When I asked if she is experiencing light headedness or dizziness she stated today she is.  AT time of call pt is at her PCP office waiting to be seen by MD. Advised I would put Strips into chart in case they are able to access her EMR.    Will review strips with covering MD this afternoon.

## 2019-08-07 NOTE — Progress Notes (Signed)
Primary Care Physician: Orpah Melter, MD Primary Cardiologist: Dr Ellyn Hack Primary Electrophysiologist: Dr Curt Bears Referring Physician: Dr Evie Lacks [name] is a 74 y.o. female with a history of DM, HTN, GERD, HLD, prior CVA, and new onset paroxysmal atrial fibrillation who presents for follow up in the [facility]. 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 The device clinic received an alert for two, 4-minute episodes of afib on 08/06/19. Patient has a CHADS2VASC score of 6. She was unaware of her arrhythmia. She does state that since her stroke she has had issues with balance and nausea. She denies significant alcohol use or snoring.   Today, she denies symptoms of palpitations, chest pain, shortness of breath, orthopnea, PND, lower extremity edema, presyncope, syncope, snoring, daytime somnolence, bleeding, or neurologic sequela. The patient is tolerating medications without difficulties and is otherwise without complaint today. +nausea   Atrial Fibrillation Risk Factors:  she does not have symptoms or diagnosis of sleep apnea. she does not have a history of rheumatic fever. she does not have a history of alcohol use. The patient does not have a history of early familial atrial fibrillation or other arrhythmias.  she has a BMI of Body mass index is 37.03 kg/m.Marland Kitchen Filed Weights   08/08/19 0848  Weight: 88.9 kg    Family History  Problem Relation Age of Onset  . Diabetes Mother   . Hyperlipidemia Mother   . Stroke Mother 20  . Liver cancer Father   . Hypertension Sister   . Hyperlipidemia Sister   . Diabetes Sister        On insulin  . Depression Sister   . Kidney disease Brother        After dialysis he had renal transplant  . Diabetes Brother   . Coronary artery disease Brother   . Peripheral Artery Disease Brother   . Stroke Brother 84  . Bladder Cancer Brother   . Diabetes  Brother      Atrial Fibrillation Management history:  Previous antiarrhythmic drugs: none Previous cardioversions: none Previous ablations: none CHADS2VASC score: 6 Anticoagulation history: none, on ASA and Plavix   Past Medical History:  Diagnosis Date  . Actinic keratosis    Followed by Dr. Allyson Sabal  . Anxiety   . Diabetes mellitus, type II, insulin dependent ([place])    Associated with obesity: She takes Lantus, Victoza & Actos  . Essential hypertension   . GERD (gastroesophageal reflux disease)   . Hyperlipidemia   . Morbid obesity with BMI of 40.0-44.9, adult [facility])    Past Surgical History:  Procedure Laterality Date  . BREAST BIOPSY  1997  . LEFT HEART CATH AND CORONARY ANGIOGRAPHY N/A 01/05/2017   Procedure: LEFT HEART CATH AND CORONARY ANGIOGRAPHY;  Surgeon: Leonie Man, MD;  Location: [facility];  Service: Cardiovascular: Angiographically normal coronary arteries with a right dominant system.  Normal EF 55-65%.  Marland Kitchen LOOP RECORDER INSERTION N/A 08/01/2019   Procedure: LOOP RECORDER INSERTION;  Surgeon: Constance Haw, MD;  Location: [facility];  Service: Cardiovascular;  Laterality: N/A;    Current Outpatient Medications  Medication Sig Dispense Refill  . atorvastatin (LIPITOR) 40 MG tablet Take 1 tablet (40 mg total) by mouth daily. 30 tablet 2  . blood glucose meter kit and supplies KIT Dispense based on patient and insurance preference. Use up to four times daily as directed. (FOR ICD-9 250.00, 250.01).  1 each 0  . carvedilol (COREG) 3.125 MG tablet Take 1 tablet (3.125 mg total) by mouth 2 (two) times daily. 60 tablet 2  . Insulin NPH, Human,, Isophane, (NOVOLIN N FLEXPEN RELION) 100 UNIT/ML Kiwkpen Inject 10 Units into the skin 2 (two) times daily at 8 am and 10 pm. 15 mL 11  . Insulin Syringe-Needle U-100 (RELION INSULIN SYR .3CC/29G) 29G X 1/2" 0.3 ML MISC 1 each by Does not apply route in the morning and at bedtime. 100 each 0  . Lancets  (ONETOUCH DELICA PLUS [id number]) MISC USE 1 LANCET TO CHECK GLUCOSE UP TO 4 TIMES DAILY    . lisinopril-hydrochlorothiazide (PRINZIDE,ZESTORETIC) 20-12.5 MG tablet Take 1 tablet by mouth daily.    . Naproxen Sod-Diphenhydramine (ALEVE PM) 220-25 MG TABS Take 2 tablets by mouth at bedtime as needed (sleep).    . nitroGLYCERIN (NITROSTAT) 0.4 MG SL tablet Place 1 tablet (0.4 mg total) under the tongue every 5 (five) minutes as needed for chest pain. 25 tablet 5  . ONETOUCH VERIO test strip USE 1 STRIP TO CHECK GLUCOSE UP TO 4 TIMES DAILY AS DIRECTED    . pioglitazone (ACTOS) 30 MG tablet Take 30 mg by mouth every evening.     Marland Kitchen RELION PEN NEEDLES 31G X 6 MM MISC USE 1 IN THE MORNING AND AT BEDTIME    . apixaban (ELIQUIS) 5 MG TABS tablet Take 1 tablet (5 mg total) by mouth 2 (two) times daily. 60 tablet 3   No current facility-administered medications for this encounter.    Allergies  Allergen Reactions  . Livalo [Pitavastatin]     MYALGIAS   . Sulfa Antibiotics Itching    Itching and red spots.    Social History   Socioeconomic History  . Marital status: Widowed    Spouse name: Not on file  . Number of children: 3  . Years of education: 25  . Highest education level: Not on file  Occupational History    Comment: Retired  Tobacco Use  . Smoking status: Never Smoker  . Smokeless tobacco: Never Used  Substance and Sexual Activity  . Alcohol use: No  . Drug use: No  . Sexual activity: Not Currently  Other Topics Concern  . Not on file  Social History Narrative   She is a widowed mother of 3 with 5 grandchildren. She lives with one of her daughters and grandson. She is a coming by her sister. She tries to walk, but does not do it regularly.   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   Social Determinants of Health   Financial Resource Strain:   . Difficulty of Paying Living Expenses:   Food Insecurity:   . Worried About Charity fundraiser in the Last Year:     . Arboriculturist in the Last Year:   Transportation Needs:   . Film/video editor (Medical):   Marland Kitchen Lack of Transportation (Non-Medical):   Physical Activity:   . Days of Exercise per Week:   . Minutes of Exercise per Session:   Stress:   . Feeling of Stress :   Social Connections:   . Frequency of Communication with Friends and Family:   . Frequency of Social Gatherings with Friends and Family:   . Attends Religious Services:   . Active Member of Clubs or Organizations:   . Attends Archivist Meetings:   Marland Kitchen Marital Status:   Intimate Partner Violence:   .  Fear of Current or Ex-Partner:   . Emotionally Abused:   Marland Kitchen Physically Abused:   . Sexually Abused:      ROS- All systems are reviewed and negative except as per the HPI above.  Physical Exam: Vitals:   08/08/19 0848  BP: 128/78  Pulse: 75  Weight: 88.9 kg  Height: _0  (1.549 m)    GEN- The patient is well appearing obese elderly female, alert and oriented x 3 today.   Head- normocephalic, atraumatic Eyes-  Sclera clear, conjunctiva pink Ears- hearing intact Oropharynx- clear Neck- supple  Lungs- Clear to ausculation bilaterally, normal work of breathing Heart- Regular rate and rhythm, occasional ectopic beat heard, no murmurs, rubs or gallops  GI- soft, NT, ND, + BS Extremities- no clubbing, cyanosis, or edema MS- no significant deformity or atrophy Skin- no rash or lesion Psych- euthymic mood, full affect Neuro- strength and sensation are intact  Wt Readings from Last 3 Encounters:  08/08/19 88.9 kg  02/12/19 92.5 kg  01/25/18 114.7 kg    EKG today demonstrates SR HR 75, frequent PACs, PR 160, QTc 453  Echo 08/01/19 demonstrated  1. Left ventricular ejection fraction, by estimation, is 60 to 65%. The  left ventricle has normal function. The left ventricle has no regional  wall motion abnormalities. Left ventricular diastolic parameters are  consistent with Grade I diastolic  dysfunction  (impaired relaxation). Elevated left atrial pressure.  2. Right ventricular systolic function is normal. The right ventricular  size is normal. There is normal pulmonary artery systolic pressure. The  estimated right ventricular systolic pressure is 38.8 mmHg.  3. The mitral valve is normal in structure. Trivial mitral valve  regurgitation. No evidence of mitral stenosis.  4. Tricuspid valve regurgitation is moderate.  5. The aortic valve is normal in structure. Aortic valve regurgitation is  not visualized. No aortic stenosis is present.  6. The inferior vena cava is normal in size with greater than 50%  respiratory variability, suggesting right atrial pressure of 3 mmHg.   Conclusion(s)/Recommendation(s): No intracardiac source of embolism  detected on this transthoracic study. A transesophageal echocardiogram is  recommended to exclude cardiac source of embolism if clinically indicated.   Epic records are reviewed at length today  CHA2DS2-VASc Score = 6  The patient's score is based upon: CHF History: 0 HTN History: 1 Age : 1 Diabetes History: 1 Stroke History: 2 Vascular Disease History: 0 Gender: 1      ASSESSMENT AND PLAN: 1. Paroxysmal Atrial Fibrillation (ICD10:  I48.0) The patient's CHA2DS2-VASc score is 6, indicating a 9.7% annual risk of stroke.   General education about afib provided and questions answered. We also discussed her stroke risk and the risks and benefits of anticoagulation.  Will plan to start Eliquis 5 mg BID. Will stop ASA and Plavix. Recheck CBC/bmet on follow up. Continue Coreg 3.125 mg BID  2. Secondary Hypercoagulable State (ICD10:  D68.69) The patient is at significant risk for stroke/thromboembolism based upon her CHA2DS2-VASc Score of 6.  Start Apixaban (Eliquis).   3. Obesity Body mass index is 37.03 kg/m. Lifestyle modification was discussed at length including regular exercise and weight reduction.  4. HTN Stable, no changes  today.   Follow up in the AF clinic in one month.    [facility] [address] [place], [place] [address] [phone] 08/08/2019 9:31 AM

## 2019-08-07 NOTE — Telephone Encounter (Signed)
Transmission reviewed by Dr. Caryl Comes, AF confirmed, both short episodes.  AF clinic recommended.  Spoke with pt, she has returned home from PCP office.  States she feels better now, but did need more assistance than usual to get to her car.  She is agreeable to AF clinic, advised she will receive contact to set up consult.

## 2019-08-07 NOTE — Telephone Encounter (Signed)
Called and spoke with patient.  She is aware of appt 5/12 @8 :30 with Adline Peals, PA.

## 2019-08-08 ENCOUNTER — Encounter (HOSPITAL_COMMUNITY): Payer: Self-pay | Admitting: Physician Assistant

## 2019-08-08 ENCOUNTER — Ambulatory Visit (HOSPITAL_COMMUNITY)
Admission: RE | Admit: 2019-08-08 | Discharge: 2019-08-08 | Disposition: A | Discharge status: Home / Self Care | Payer: Medicare Other | Source: Ambulatory Visit | Attending: Physician Assistant | Admitting: Physician Assistant

## 2019-08-08 ENCOUNTER — Other Ambulatory Visit: Payer: Self-pay

## 2019-08-08 VITALS — BP 128/78 | HR 75 | Ht 61.0 in | Wt 196.0 lb

## 2019-08-08 DIAGNOSIS — Z7982 Long term (current) use of aspirin: Secondary | ICD-10-CM | POA: Diagnosis not present

## 2019-08-08 DIAGNOSIS — Z888 Allergy status to other drugs, medicaments and biological substances status: Secondary | ICD-10-CM | POA: Diagnosis not present

## 2019-08-08 DIAGNOSIS — D6869 Other thrombophilia: Secondary | ICD-10-CM

## 2019-08-08 DIAGNOSIS — I480 Paroxysmal atrial fibrillation: Secondary | ICD-10-CM | POA: Insufficient documentation

## 2019-08-08 DIAGNOSIS — Z794 Long term (current) use of insulin: Secondary | ICD-10-CM | POA: Diagnosis not present

## 2019-08-08 DIAGNOSIS — E669 Obesity, unspecified: Secondary | ICD-10-CM | POA: Diagnosis not present

## 2019-08-08 DIAGNOSIS — E119 Type 2 diabetes mellitus without complications: Secondary | ICD-10-CM | POA: Insufficient documentation

## 2019-08-08 DIAGNOSIS — Z881 Allergy status to other antibiotic agents status: Secondary | ICD-10-CM | POA: Insufficient documentation

## 2019-08-08 DIAGNOSIS — Z7902 Long term (current) use of antithrombotics/antiplatelets: Secondary | ICD-10-CM | POA: Insufficient documentation

## 2019-08-08 DIAGNOSIS — E785 Hyperlipidemia, unspecified: Secondary | ICD-10-CM | POA: Insufficient documentation

## 2019-08-08 DIAGNOSIS — I10 Essential (primary) hypertension: Secondary | ICD-10-CM | POA: Insufficient documentation

## 2019-08-08 DIAGNOSIS — Z8673 Personal history of transient ischemic attack (TIA), and cerebral infarction without residual deficits: Secondary | ICD-10-CM | POA: Insufficient documentation

## 2019-08-08 DIAGNOSIS — Z79899 Other long term (current) drug therapy: Secondary | ICD-10-CM | POA: Diagnosis not present

## 2019-08-08 DIAGNOSIS — Z6837 Body mass index (BMI) 37.0-37.9, adult: Secondary | ICD-10-CM | POA: Insufficient documentation

## 2019-08-08 MED ORDER — APIXABAN 5 MG PO TABS
5.0000 mg | ORAL_TABLET | Freq: Two times a day (BID) | ORAL | 3 refills | Status: DC
Start: 2019-08-08 — End: 2019-12-06

## 2019-08-08 NOTE — Patient Instructions (Signed)
Stop aspirin  Stop plavix   Start Eliquis 5mg twice a day 

## 2019-08-09 ENCOUNTER — Other Ambulatory Visit: Payer: Self-pay

## 2019-08-09 ENCOUNTER — Ambulatory Visit (INDEPENDENT_AMBULATORY_CARE_PROVIDER_SITE_OTHER): Payer: Medicare Other | Admitting: Emergency Medicine

## 2019-08-09 DIAGNOSIS — I480 Paroxysmal atrial fibrillation: Secondary | ICD-10-CM

## 2019-08-09 DIAGNOSIS — Z95818 Presence of other cardiac implants and grafts: Secondary | ICD-10-CM

## 2019-08-09 LAB — CUP PACEART INCLINIC DEVICE CHECK
Date Time Interrogation Session: 20210513164139
Implantable Pulse Generator Implant Date: 20210505

## 2019-08-09 NOTE — Patient Instructions (Signed)
Do not come into the office for "remote pacer check" appointments.  These are automatic transmissions through your home monitor.  Please call the [facility] at [phone] with any questions.

## 2019-08-09 NOTE — Progress Notes (Signed)
ILR wound check in clinic. Steri strips removed by patient prior to visit. Wound well healed. Home monitor transmitting nightly. No tachy episodes. 2 AF episodes (<0.1%), both 67min duration and previously reviewed by MD, Eliquis initiated 08/08/19 during AF Clinic appointment. Patient and daughter educated about wound care and Carelink monitor. Monthly summary reports and ROV with Dr. Curt Bears PRN.

## 2019-08-16 ENCOUNTER — Other Ambulatory Visit: Payer: Self-pay

## 2019-08-16 ENCOUNTER — Ambulatory Visit: Payer: Medicare Other | Attending: Internal Medicine

## 2019-08-16 VITALS — BP 140/86 | HR 80

## 2019-08-16 DIAGNOSIS — M6281 Muscle weakness (generalized): Secondary | ICD-10-CM | POA: Insufficient documentation

## 2019-08-16 DIAGNOSIS — R2689 Other abnormalities of gait and mobility: Secondary | ICD-10-CM | POA: Diagnosis not present

## 2019-08-16 DIAGNOSIS — R2681 Unsteadiness on feet: Secondary | ICD-10-CM | POA: Diagnosis present

## 2019-08-16 NOTE — Therapy (Signed)
[name] [address] [place], [place], [address] Phone: [phone]   Fax:  [phone]  Physical Therapy Evaluation  Patient Details  Name: [name] MRN: [id number] Date of Birth: 17-Oct-1945 Referring Provider (PT): [name] will be following   Encounter Date: 08/16/2019  PT End of Session - 08/16/19 1445    Visit Number  1    Number of Visits  17    Date for PT Re-Evaluation  73/53/29   90 day cert, 60 day poc   Authorization Type  [employer] medicare so 10th visit progress note, FOTO    PT Start Time  1400    PT Stop Time  1445    PT Time Calculation (min)  45 min    Equipment Utilized During Treatment  Gait belt    Activity Tolerance  Patient tolerated treatment well    Behavior During Therapy  WFL for tasks assessed/performed       Past Medical History:  Diagnosis Date  . Actinic keratosis    Followed by [name]  . Anxiety   . Diabetes mellitus, type II, insulin dependent ([place])    Associated with obesity: She takes Lantus, Victoza & Actos  . Essential hypertension   . GERD (gastroesophageal reflux disease)   . Hyperlipidemia   . Morbid obesity with BMI of 40.0-44.9, adult [facility])     Past Surgical History:  Procedure Laterality Date  . BREAST BIOPSY  1997  . LEFT HEART CATH AND CORONARY ANGIOGRAPHY N/A 01/05/2017   Procedure: LEFT HEART CATH AND CORONARY ANGIOGRAPHY;  Surgeon: Kerry Man, MD;  Location: [facility];  Service: Cardiovascular: Angiographically normal coronary arteries with a right dominant system.  Normal EF 55-65%.  Marland Kitchen LOOP RECORDER INSERTION N/A 08/01/2019   Procedure: LOOP RECORDER INSERTION;  Surgeon: Kerry Haw, MD;  Location: [facility];  Service: Cardiovascular;  Laterality: N/A;    Vitals:   08/16/19 1427  BP: 140/86  Pulse: 80     Subjective Assessment - 08/16/19 1403    Subjective  Pt was hospitalized 5/4 to 5/6 with cerebellar  infarct. She reports that she had had some balance issues for months. She thought it was her medicine. [facility] been at [facility] and they noted BP very high and sent her to hospital. That was wheen CVA discovered. Had loop recorder placed.  Pt had been using walker when she needed at home some since stroke. Reports that it all depends how she is feeling. She reports balance is worse later in day and always seems to veer to the left. Pt denies any dizziness just describes more as unsteady. Pt was holding son's arm when she came in to therapy.  Pt reports she was independent prior to this.    Patient is accompained by:  Family member   son, Kit   Pertinent History  PMH: type 2 diabetes, uncontrolled with hyperglycemia currently not taking any treatment, hypertension, not taking any medication since at least Christmas time, morbid obesity, A-fib    Patient Stated Goals  Pt wants to get better so she can be independent.    Currently in Pain?  No/denies         [facility] PT Assessment - 08/16/19 1409      Assessment   Medical Diagnosis  cerebellar infarct    Referring Provider (PT)  [name] will be following    Onset Date/Surgical Date  07/31/19    Hand Dominance  Right  Next MD Visit  09/04/19    Prior Therapy  Only PT evaluation in hospital      Precautions   Precautions  Fall    Precaution Comments  pt has loop recorder      Balance Screen   Has the patient fallen in the past 6 months  No    Has the patient had a decrease in activity level because of a fear of falling?   Yes    Is the patient reluctant to leave their home because of a fear of falling?   Yes      [place];Other relatives   daughter and grandaughter   Available Help at Discharge  Family    Type of [place] to enter    Entrance Stairs-Number of Steps  3    Entrance Stairs-Rails  Right    Home Layout  One level    [place] - 2 wheels;Shower seat;Grab bars - tub/shower      Prior Function   Level of Independence  Independent    Vocation  Retired    Leisure  Pt reports that she "kept the road out". She enjoys going to [place]   Overall Cognitive Status  Within Functional Limits for tasks assessed      Observation/Other Assessments   Observations  occasional double vision. None reported today. Peripheral vision intact.      Sensation   Light Touch  Appears Intact    Additional Comments  Intact light touch in feet.      Coordination   Gross Motor Movements are Fluid and Coordinated  Yes   RAMs intact   Fine Motor Movements are Fluid and Coordinated  Yes   finger opposition intact   Finger Nose Finger Test  intact      ROM / Strength   AROM / PROM / Strength  Strength      Strength   Strength Assessment Site  Shoulder;Elbow;Hand;Hip;Knee;Ankle    Right/Left Shoulder  Right;Left    Right Shoulder Flexion  5/5    Left Shoulder Flexion  5/5    Right/Left Elbow  Right;Left    Right Elbow Flexion  5/5    Right Elbow Extension  5/5    Left Elbow Flexion  5/5    Left Elbow Extension  5/5    Right/Left hand  Right;Left    Right Hand Gross Grasp  Functional    Left Hand Gross Grasp  Functional    Right/Left Hip  Right;Left    Right Hip Flexion  5/5    Right Hip ABduction  4+/5    Left Hip Flexion  5/5    Left Hip ABduction  4+/5    Right/Left Knee  Right;Left    Right Knee Flexion  4+/5    Right Knee Extension  5/5    Left Knee Flexion  4+/5    Left Knee Extension  5/5    Right/Left Ankle  Right;Left    Right Ankle Dorsiflexion  5/5    Right Ankle Plantar Flexion  5/5    Left Ankle Dorsiflexion  5/5    Left Ankle Plantar Flexion  5/5      Transfers   Transfers  Sit to Stand;Stand to Sit    Sit to Stand  5: Supervision    Sit  to Stand Details (indicate cue type and reason)  with UE support    Five time sit to stand comments   20.39 sec from mat  with hands    Stand to Sit  5: Supervision      Ambulation/Gait   Ambulation/Gait  Yes    Ambulation/Gait Assistance  4: Min guard    Ambulation Distance (Feet)  100 Feet    Assistive device  None    Gait Pattern  Step-through pattern;Decreased step length - right;Decreased step length - left;Decreased arm swing - right;Decreased arm swing - left;Decreased trunk rotation    Ambulation Surface  Level;Indoor    Gait velocity  11.29 sec=0.47ms    Stairs  Yes    Stairs Assistance  5: Supervision    Stairs Assistance Details (indicate cue type and reason)  Reciprocal up with 1 hand, step-to down with 1 rail or reciprocal if 2 rails    Stair Management Technique  Alternating pattern;Step to pattern;One rail Right;Two rails    Number of Stairs  4      Functional Gait  Assessment   Gait assessed   Yes    Gait Level Surface  Walks 20 ft, slow speed, abnormal gait pattern, evidence for imbalance or deviates 10-15 in outside of the 12 in walkway width. Requires more than 7 sec to ambulate 20 ft.    Change in Gait Speed  Makes only minor adjustments to walking speed, or accomplishes a change in speed with significant gait deviations, deviates 10-15 in outside the 12 in walkway width, or changes speed but loses balance but is able to recover and continue walking.    Gait with Horizontal Head Turns  Performs head turns with moderate changes in gait velocity, slows down, deviates 10-15 in outside 12 in walkway width but recovers, can continue to walk.    Gait with Vertical Head Turns  Performs task with moderate change in gait velocity, slows down, deviates 10-15 in outside 12 in walkway width but recovers, can continue to walk.    Gait and Pivot Turn  Turns slowly, requires verbal cueing, or requires several small steps to catch balance following turn and stop    Step Over Obstacle  Cannot perform without assistance.    Gait with Narrow Base of Support  Ambulates less than 4 steps heel to toe or cannot  perform without assistance.    Gait with Eyes Closed  Cannot walk 20 ft without assistance, severe gait deviations or imbalance, deviates greater than 15 in outside 12 in walkway width or will not attempt task.    Ambulating Backwards  Walks 20 ft, slow speed, abnormal gait pattern, evidence for imbalance, deviates 10-15 in outside 12 in walkway width.    Steps  Two feet to a stair, must use rail.    Total Score  7                  Objective measurements completed on examination: See above findings.              PT Education - 08/16/19 1917    Education Details  PT plan of care.    Person(s) Educated  Patient;Child(ren)    Methods  Explanation    Comprehension  Verbalized understanding       PT Short Term Goals - 08/16/19 1927      PT SHORT TERM GOAL #1   Title  Pt will be able to perform initial HEP with supervision of family for  balance.    Time  4    Period  Weeks    Status  New    Target Date  09/15/19      PT SHORT TERM GOAL #2   Title  Pt will ambulate >300' on level surfaces with LRAD versus no device mod I for improved household mobility.    Time  4    Period  Weeks    Status  New    Target Date  09/15/19      PT SHORT TERM GOAL #3   Title  Pt will increase FGA from 7 to >12/30 for improved balance and gait safety.    Baseline  7/30 on 08/16/19    Time  4    Period  Weeks    Status  New    Target Date  09/15/19        PT Long Term Goals - 08/16/19 1929      PT LONG TERM GOAL #1   Title  Pt will be independent with progressive balance program for continued gains on own.    Time  8    Period  Weeks    Status  New    Target Date  10/15/19      PT LONG TERM GOAL #2   Title  Pt will ambulate >500' on varied surfaces independently for improved community mobility.    Time  8    Period  Weeks    Status  New    Target Date  10/15/19      PT LONG TERM GOAL #3   Title  Pt will decrease 5 x sit to stand from 20.39 sec to <16 sec from mat  with hands for improved balance and functional strength.    Baseline  20.39 sec from mat with hands    Time  8    Period  Weeks    Status  New    Target Date  10/15/19      PT LONG TERM GOAL #4   Title  Pt will increase FGA from 7/30 to >19/30 for improved balance and gait safety.    Baseline  7/30 on 08/16/19    Time  8    Period  Weeks    Status  New    Target Date  10/15/19             Plan - 08/16/19 1919    Clinical Impression Statement  Pt is 74 y/o female who presents after cerebellar infarct with loop recorder placed. Pt with balance deficits as evident by 5 x sit to stand of 20.39 sec and FGA score of 7/30 indicating high fall risk. Pt needs close SBA/CGA with gait for safety without AD with abnormal gait. Pt is ambulating at gait speed of 0.6ms which is decreased from normal community ambulator. Pt will benefit from skilled PT to address balance and functional mobility deficits.    Personal Factors and Comorbidities  Comorbidity 3+    Comorbidities  DM2, HTN, morbid obesity, a-fib    Examination-Activity Limitations  Stairs;Transfers;Locomotion Level    Examination-Participation Restrictions  Community Activity;Cleaning    Stability/Clinical Decision Making  Evolving/Moderate complexity    Clinical Decision Making  Moderate    Rehab Potential  Good    PT Frequency  2x / week    PT Duration  8 weeks    PT Treatment/Interventions  ADLs/Self Care Home Management;Aquatic Therapy;DME Instruction;Therapeutic activities;Functional mobility training;Stair training;Gait training;Therapeutic exercise;Balance training;Neuromuscular re-education;Vestibular;Patient/family education  PT Next Visit Plan  Check standing balance feet together and on compliant surfaces, gait training, balance exercises    Consulted and Agree with Plan of Care  Patient;Family member/caregiver    Family Member Consulted  son, Kit       Patient will benefit from skilled therapeutic intervention in  order to improve the following deficits and impairments:  Abnormal gait, Decreased activity tolerance, Decreased balance, Decreased mobility, Decreased knowledge of use of DME  Visit Diagnosis: Other abnormalities of gait and mobility  Unsteadiness on feet     Problem List Patient Active Problem List   Diagnosis Date Noted  . Paroxysmal atrial fibrillation ([place]) 08/08/2019  . Secondary hypercoagulable state ([place]) 08/08/2019  . Acute cerebrovascular accident of cerebellum ([place]) 07/31/2019  . GERD (gastroesophageal reflux disease)   . Leukocytosis   . Morbid obesity ([place]) 01/25/2018  . Chest pain with low risk for cardiac etiology 12/31/2016  . Essential hypertension 12/31/2016  . Hyperlipidemia 12/31/2016  . DM (diabetes mellitus), type 2 with complications ([place]) 45/05/8880    Electa Sniff, PT, DPT, NCS 08/16/2019, 7:33 PM  [facility] [address] [place], [place], [address] Phone: [phone]   Fax:  [phone]  Name: [name] MRN: [id number] Date of Birth: 1945-09-06

## 2019-08-21 ENCOUNTER — Other Ambulatory Visit: Payer: Self-pay

## 2019-08-21 ENCOUNTER — Ambulatory Visit: Payer: Medicare Other

## 2019-08-21 DIAGNOSIS — R2689 Other abnormalities of gait and mobility: Secondary | ICD-10-CM

## 2019-08-21 DIAGNOSIS — R2681 Unsteadiness on feet: Secondary | ICD-10-CM

## 2019-08-21 DIAGNOSIS — M6281 Muscle weakness (generalized): Secondary | ICD-10-CM

## 2019-08-21 NOTE — Therapy (Signed)
[facility] [address] [place], [place], [address] Phone: [phone]   Fax:  [phone]  Physical Therapy Treatment  Patient Details  Name: [name] MRN: [id number] Date of Birth: Oct 11, 1945 Referring Provider (PT): Frann Rider will be following   Encounter Date: 08/21/2019  PT End of Session - 08/21/19 1706    Visit Number  2    Number of Visits  17    Date for PT Re-Evaluation  54/00/86   90 day cert, 60 day poc   Authorization Type  [employer] medicare so 10th visit progress note, FOTO    PT Start Time  1705    PT Stop Time  1741   stopped early as pt not feeling well   PT Time Calculation (min)  36 min    Equipment Utilized During Treatment  Gait belt    Activity Tolerance  Patient tolerated treatment well    Behavior During Therapy  WFL for tasks assessed/performed       Past Medical History:  Diagnosis Date  . Actinic keratosis    Followed by Dr. Allyson Sabal  . Anxiety   . Diabetes mellitus, type II, insulin dependent ([place])    Associated with obesity: She takes Lantus, Victoza & Actos  . Essential hypertension   . GERD (gastroesophageal reflux disease)   . Hyperlipidemia   . Morbid obesity with BMI of 40.0-44.9, adult [facility])     Past Surgical History:  Procedure Laterality Date  . BREAST BIOPSY  1997  . LEFT HEART CATH AND CORONARY ANGIOGRAPHY N/A 01/05/2017   Procedure: LEFT HEART CATH AND CORONARY ANGIOGRAPHY;  Surgeon: Leonie Man, MD;  Location: [facility];  Service: Cardiovascular: Angiographically normal coronary arteries with a right dominant system.  Normal EF 55-65%.  Marland Kitchen LOOP RECORDER INSERTION N/A 08/01/2019   Procedure: LOOP RECORDER INSERTION;  Surgeon: Constance Haw, MD;  Location: [facility];  Service: Cardiovascular;  Laterality: N/A;    There were no vitals filed for this visit.  Subjective Assessment - 08/21/19 1707    Subjective  Pt reports that she  went to some yard sales this weekend. She still feels "swimmy headed" and less steady but did have a good day on Friday with less dizziness. Pt reports that in general is worse later in the day. Pt reports that she has been sitting quite a bit.    Patient is accompained by:  Family member   son, Kit   Pertinent History  PMH: type 2 diabetes, uncontrolled with hyperglycemia currently not taking any treatment, hypertension, not taking any medication since at least Christmas time, morbid obesity, A-fib    Patient Stated Goals  Pt wants to get better so she can be independent.    Currently in Pain?  No/denies                        [facility] Adult PT Treatment/Exercise - 08/21/19 1710      Ambulation/Gait   Ambulation/Gait  Yes    Ambulation/Gait Assistance  4: Min guard    Ambulation/Gait Assistance Details  Around in clinic for activities with hand held assist of son when walked in.     Assistive device  None    Gait Pattern  Step-through pattern;Decreased step length - right;Decreased step length - left;Wide base of support    Ambulation Surface  Level;Indoor      High Level Balance   High Level Balance Comments  Modified CTSIB 1/4 with standing on firm surface the only one she held 30 sec but had sway with that as well.      Neuro Re-ed    Neuro Re-ed Details   Pt performed standing on ground feet apart without UE support x 30 sec then x 30 sec eyes closed with CGA/min assist as pt had LOB multiple times with eyes closed posterior and to left. Eyes open with head turns left/right x 10. Pt had decreased stability. On airex with head turns left/right x 10 with increased sway noted CGA/min assist. Standing on airex feet apart eyes open x 30 sec with CGA with increased sway, eyes closed with frequent LOB and increased sway. Standing on airex with feet apart eyes open x 30 sec then pt reported suddenly feeling hot and nauseated. Assisted her to sit in chair and supplied bag for patient.  Pt had a couple dry heaves. Reports she had episode like this at [facility] office the other week when a-fib was acting up. Checked all vitals and they were normal. BP=138/78, HR=80, O2=96%. Pt reported feeling better so worked a little more on gait in // bars without UE support x 3 laps with taking her time on turns. At end of session pt feeling warm again. PT discussed with patient and son checking blood sugar when she gets home to be sure that is not affecting her as she had not eaten since 12. They reported they would and would let PT know next time.               PT Short Term Goals - 08/16/19 1927      PT SHORT TERM GOAL #1   Title  Pt will be able to perform initial HEP with supervision of family for balance.    Time  4    Period  Weeks    Status  New    Target Date  09/15/19      PT SHORT TERM GOAL #2   Title  Pt will ambulate >300' on level surfaces with LRAD versus no device mod I for improved household mobility.    Time  4    Period  Weeks    Status  New    Target Date  09/15/19      PT SHORT TERM GOAL #3   Title  Pt will increase FGA from 7 to >12/30 for improved balance and gait safety.    Baseline  7/30 on 08/16/19    Time  4    Period  Weeks    Status  New    Target Date  09/15/19        PT Long Term Goals - 08/16/19 1929      PT LONG TERM GOAL #1   Title  Pt will be independent with progressive balance program for continued gains on own.    Time  8    Period  Weeks    Status  New    Target Date  10/15/19      PT LONG TERM GOAL #2   Title  Pt will ambulate >500' on varied surfaces independently for improved community mobility.    Time  8    Period  Weeks    Status  New    Target Date  10/15/19      PT LONG TERM GOAL #3   Title  Pt will decrease 5 x sit to stand from 20.39 sec to <16 sec from mat with hands for  improved balance and functional strength.    Baseline  20.39 sec from mat with hands    Time  8    Period  Weeks    Status  New    Target Date   10/15/19      PT LONG TERM GOAL #4   Title  Pt will increase FGA from 7/30 to >19/30 for improved balance and gait safety.    Baseline  7/30 on 08/16/19    Time  8    Period  Weeks    Status  New    Target Date  10/15/19            Plan - 08/21/19 1808    Clinical Impression Statement  Pt was challenged with static balance assessment with 1/4 on Modified CTSIB. Relies heavily on vision and poor vestibular input. Pt became nauseated during session so ended early.    Personal Factors and Comorbidities  Comorbidity 3+    Comorbidities  DM2, HTN, morbid obesity, a-fib    Examination-Activity Limitations  Stairs;Transfers;Locomotion Level    Examination-Participation Restrictions  Community Activity;Cleaning    Stability/Clinical Decision Making  Evolving/Moderate complexity    Rehab Potential  Good    PT Frequency  2x / week    PT Duration  8 weeks    PT Treatment/Interventions  ADLs/Self Care Home Management;Aquatic Therapy;DME Instruction;Therapeutic activities;Functional mobility training;Stair training;Gait training;Therapeutic exercise;Balance training;Neuromuscular re-education;Vestibular;Patient/family education    PT Next Visit Plan  How was blood sugar after last visit? gait training, balance exercises. Monitor HR.    Consulted and Agree with Plan of Care  Patient;Family member/caregiver    Family Member Consulted  son, Kit       Patient will benefit from skilled therapeutic intervention in order to improve the following deficits and impairments:  Abnormal gait, Decreased activity tolerance, Decreased balance, Decreased mobility, Decreased knowledge of use of DME  Visit Diagnosis: Other abnormalities of gait and mobility  Muscle weakness (generalized)  Unsteadiness on feet     Problem List Patient Active Problem List   Diagnosis Date Noted  . Paroxysmal atrial fibrillation ([place]) 08/08/2019  . Secondary hypercoagulable state ([place]) 08/08/2019  . Acute  cerebrovascular accident of cerebellum ([place]) 07/31/2019  . GERD (gastroesophageal reflux disease)   . Leukocytosis   . Morbid obesity ([place]) 01/25/2018  . Chest pain with low risk for cardiac etiology 12/31/2016  . Essential hypertension 12/31/2016  . Hyperlipidemia 12/31/2016  . DM (diabetes mellitus), type 2 with complications ([place]) 70/96/4383    Electa Sniff, PT, DPT, NCS 08/21/2019, 6:11 PM  [facility] [address] [place], [place], [address] Phone: [phone]   Fax:  [phone]  Name: Kairee Kozma MRN: [id number] Date of Birth: [birth year]

## 2019-08-23 ENCOUNTER — Other Ambulatory Visit: Payer: Self-pay

## 2019-08-23 ENCOUNTER — Ambulatory Visit: Payer: Medicare Other

## 2019-08-23 DIAGNOSIS — R2681 Unsteadiness on feet: Secondary | ICD-10-CM

## 2019-08-23 DIAGNOSIS — R2689 Other abnormalities of gait and mobility: Secondary | ICD-10-CM

## 2019-08-23 DIAGNOSIS — M6281 Muscle weakness (generalized): Secondary | ICD-10-CM

## 2019-08-23 NOTE — Therapy (Signed)
[facility] [address] [place], [place], [address] Phone: [phone]   Fax:  [phone]  Physical Therapy Treatment  Patient Details  Name: [name] MRN: [id number] Date of Birth: [birth year] Referring Provider (PT): Frann Rider will be following   Encounter Date: 08/23/2019  PT End of Session - 08/23/19 1659    Visit Number  3    Number of Visits  17    Date for PT Re-Evaluation  96/22/29   90 day cert, 60 day poc   Authorization Type  [employer] medicare so 10th visit progress note, FOTO    PT Start Time  1655    PT Stop Time  1738    PT Time Calculation (min)  43 min    Equipment Utilized During Treatment  Gait belt    Activity Tolerance  Patient tolerated treatment well    Behavior During Therapy  WFL for tasks assessed/performed       Past Medical History:  Diagnosis Date  . Actinic keratosis    Followed by Dr. Allyson Sabal  . Anxiety   . Diabetes mellitus, type II, insulin dependent ([place])    Associated with obesity: She takes Lantus, Victoza & Actos  . Essential hypertension   . GERD (gastroesophageal reflux disease)   . Hyperlipidemia   . Morbid obesity with BMI of 40.0-44.9, adult [facility])     Past Surgical History:  Procedure Laterality Date  . BREAST BIOPSY  1997  . LEFT HEART CATH AND CORONARY ANGIOGRAPHY N/A 01/05/2017   Procedure: LEFT HEART CATH AND CORONARY ANGIOGRAPHY;  Surgeon: Leonie Man, MD;  Location: [place] [facility];  Service: Cardiovascular: Angiographically normal coronary arteries with a right dominant system.  Normal EF 55-65%.  Marland Kitchen LOOP RECORDER INSERTION N/A 08/01/2019   Procedure: LOOP RECORDER INSERTION;  Surgeon: Constance Haw, MD;  Location: [place] [facility];  Service: Cardiovascular;  Laterality: N/A;    There were no vitals filed for this visit.  Subjective Assessment - 08/23/19 1655    Subjective  Pt reports that she was walking fine this morning but then  this afternoon she is less steady with walking in. Blood sugar was 350 when she got home the other day.    Patient is accompained by:  Family member   son, Kit   Pertinent History  PMH: type 2 diabetes, uncontrolled with hyperglycemia currently not taking any treatment, hypertension, not taking any medication since at least Christmas time, morbid obesity, A-fib    Patient Stated Goals  Pt wants to get better so she can be independent.    Currently in Pain?  No/denies                        [facility] Adult PT Treatment/Exercise - 08/23/19 1704      Transfers   Transfers  Sit to Stand;Stand to Sit    Sit to Stand  5: Supervision    Stand to Sit  5: Supervision    Comments  Pt performed sit to stand 5 x 2 without hands from mat supervision. Pt had increased sway initially but improved as went on.      Ambulation/Gait   Ambulation/Gait  Yes    Ambulation/Gait Assistance  5: Supervision;4: Min guard    Ambulation/Gait Assistance Details  HR=86 after. Pt reports arms get tired as she goes on and needs to rest. Felt pretty good balance wise. Pt has reciprocal pattern with RW with improved  stability. Without AD needs CGA with decreased cadence and step length and no arm swing despite cues to try to relax arms.    Ambulation Distance (Feet)  230 Feet   230' without AD CGA   Assistive device  Rolling walker    Gait Pattern  Step-through pattern;Decreased arm swing - right;Decreased arm swing - left;Decreased step length - right;Decreased step length - left    Ambulation Surface  Level;Indoor      Neuro Re-ed    Neuro Re-ed Details   Standing with feet together x 30 sec without hands, feet apart with reaching across body for trunk rotation x 10 each side. Seated rest break then standing at walker alternating toe taps on 4" step. Pt needed bilateral UE support to perform. Staggered stance x 30 sec each position with increased sway.  CGA with all activities for safety. Pt felt warm halfway  through again and needed to sit and used wet cloth and drank some cold water to help. PT continues to be concerned that blood sugar may be high. Advised patient to check prior to coming to therapy next session.             PT Education - 08/23/19 2145    Education Details  Issued initial HEP. Pt instructed to check blood sugar prior to therapy. Explained that is contraindicated if sugar >300 so would need to hold if is. Encouraged her to watch her carb intake. Will be seeing MD soon to discuss possible med changes. PT also recommended walker use at this time.    Person(s) Educated  Patient;Child(ren)    Methods  Explanation;Demonstration;Handout    Comprehension  Verbalized understanding       PT Short Term Goals - 08/16/19 1927      PT SHORT TERM GOAL #1   Title  Pt will be able to perform initial HEP with supervision of family for balance.    Time  4    Period  Weeks    Status  New    Target Date  09/15/19      PT SHORT TERM GOAL #2   Title  Pt will ambulate >300' on level surfaces with LRAD versus no device mod I for improved household mobility.    Time  4    Period  Weeks    Status  New    Target Date  09/15/19      PT SHORT TERM GOAL #3   Title  Pt will increase FGA from 7 to >12/30 for improved balance and gait safety.    Baseline  7/30 on 08/16/19    Time  4    Period  Weeks    Status  New    Target Date  09/15/19        PT Long Term Goals - 08/16/19 1929      PT LONG TERM GOAL #1   Title  Pt will be independent with progressive balance program for continued gains on own.    Time  8    Period  Weeks    Status  New    Target Date  10/15/19      PT LONG TERM GOAL #2   Title  Pt will ambulate >500' on varied surfaces independently for improved community mobility.    Time  8    Period  Weeks    Status  New    Target Date  10/15/19      PT LONG TERM GOAL #3  Title  Pt will decrease 5 x sit to stand from 20.39 sec to <16 sec from mat with hands for  improved balance and functional strength.    Baseline  20.39 sec from mat with hands    Time  8    Period  Weeks    Status  New    Target Date  10/15/19      PT LONG TERM GOAL #4   Title  Pt will increase FGA from 7/30 to >19/30 for improved balance and gait safety.    Baseline  7/30 on 08/16/19    Time  8    Period  Weeks    Status  New    Target Date  10/15/19            Plan - 08/23/19 2147    Clinical Impression Statement  Pt had improved stability with use of RW. Advised to use at this time. Pt continues to have hot flashes. PT concerned blood sugar running high as has been in evenings when she has been checking.    Personal Factors and Comorbidities  Comorbidity 3+    Comorbidities  DM2, HTN, morbid obesity, a-fib    Examination-Activity Limitations  Stairs;Transfers;Locomotion Level    Examination-Participation Restrictions  Community Activity;Cleaning    Stability/Clinical Decision Making  Evolving/Moderate complexity    Rehab Potential  Good    PT Frequency  2x / week    PT Duration  8 weeks    PT Treatment/Interventions  ADLs/Self Care Home Management;Aquatic Therapy;DME Instruction;Therapeutic activities;Functional mobility training;Stair training;Gait training;Therapeutic exercise;Balance training;Neuromuscular re-education;Vestibular;Patient/family education    PT Next Visit Plan  How was blood sugar after last visit? gait training, balance exercises. Monitor HR.    Consulted and Agree with Plan of Care  Patient;Family member/caregiver    Family Member Consulted  son, Kit       Patient will benefit from skilled therapeutic intervention in order to improve the following deficits and impairments:  Abnormal gait, Decreased activity tolerance, Decreased balance, Decreased mobility, Decreased knowledge of use of DME  Visit Diagnosis: Other abnormalities of gait and mobility  Muscle weakness (generalized)  Unsteadiness on feet     Problem List Patient Active  Problem List   Diagnosis Date Noted  . Paroxysmal atrial fibrillation ([place]) 08/08/2019  . Secondary hypercoagulable state ([place]) 08/08/2019  . Acute cerebrovascular accident of cerebellum ([place]) 07/31/2019  . GERD (gastroesophageal reflux disease)   . Leukocytosis   . Morbid obesity ([place]) 01/25/2018  . Chest pain with low risk for cardiac etiology 12/31/2016  . Essential hypertension 12/31/2016  . Hyperlipidemia 12/31/2016  . DM (diabetes mellitus), type 2 with complications ([place]) 09/31/1216    Electa Sniff, PT, DPT, NCS 08/23/2019, 9:49 PM  [facility] [address] [place], [place], [address] Phone: [phone]   Fax:  [phone]  Name: [name] MRN: [id number] Date of Birth: [birth year]

## 2019-08-23 NOTE — Patient Instructions (Signed)
Access Code: [email or web address] URL: [email or web address] Date: 08/23/2019 Prepared by: Cherly Anderson  Exercises Sit to Stand - 2 x daily - 7 x weekly - 2 sets - 5 reps Standing eyes open - 2 x daily - 7 x weekly - 1 sets - 3 reps - 30 sec hold

## 2019-08-28 ENCOUNTER — Ambulatory Visit: Payer: Medicare Other | Attending: Internal Medicine

## 2019-08-28 ENCOUNTER — Other Ambulatory Visit: Payer: Self-pay

## 2019-08-28 DIAGNOSIS — M6281 Muscle weakness (generalized): Secondary | ICD-10-CM | POA: Insufficient documentation

## 2019-08-28 DIAGNOSIS — R2689 Other abnormalities of gait and mobility: Secondary | ICD-10-CM | POA: Insufficient documentation

## 2019-08-28 DIAGNOSIS — R2681 Unsteadiness on feet: Secondary | ICD-10-CM | POA: Diagnosis present

## 2019-08-28 NOTE — Therapy (Signed)
[facility] [address] [place], [place], [address] Phone: [phone]   Fax:  [phone]  Physical Therapy Treatment  Patient Details  Name: [name] MRN: [id number] Date of Birth: 01-30-1946 Referring Provider (PT): Frann Rider will be following   Encounter Date: 08/28/2019  PT End of Session - 08/28/19 1706    Visit Number  4    Number of Visits  17    Date for PT Re-Evaluation  43/15/40   90 day cert, 60 day poc   Authorization Type  [employer] medicare so 10th visit progress note, FOTO    PT Start Time  1700    PT Stop Time  1740    PT Time Calculation (min)  40 min    Equipment Utilized During Treatment  Gait belt    Activity Tolerance  Patient tolerated treatment well    Behavior During Therapy  WFL for tasks assessed/performed       Past Medical History:  Diagnosis Date  . Actinic keratosis    Followed by Dr. Allyson Sabal  . Anxiety   . Diabetes mellitus, type II, insulin dependent ([place])    Associated with obesity: She takes Lantus, Victoza & Actos  . Essential hypertension   . GERD (gastroesophageal reflux disease)   . Hyperlipidemia   . Morbid obesity with BMI of 40.0-44.9, adult [facility])     Past Surgical History:  Procedure Laterality Date  . BREAST BIOPSY  1997  . LEFT HEART CATH AND CORONARY ANGIOGRAPHY N/A 01/05/2017   Procedure: LEFT HEART CATH AND CORONARY ANGIOGRAPHY;  Surgeon: Leonie Man, MD;  Location: [facility];  Service: Cardiovascular: Angiographically normal coronary arteries with a right dominant system.  Normal EF 55-65%.  Marland Kitchen LOOP RECORDER INSERTION N/A 08/01/2019   Procedure: LOOP RECORDER INSERTION;  Surgeon: Constance Haw, MD;  Location: [facility];  Service: Cardiovascular;  Laterality: N/A;    There were no vitals filed for this visit.  Subjective Assessment - 08/28/19 1705    Subjective  Pt reports that she is doing pretty good today. Did check her  blood sugar and was 206 at 3pm today.    Patient is accompained by:  Family member   son, Kit   Pertinent History  PMH: type 2 diabetes, uncontrolled with hyperglycemia currently not taking any treatment, hypertension, not taking any medication since at least Christmas time, morbid obesity, A-fib    Patient Stated Goals  Pt wants to get better so she can be independent.    Currently in Pain?  No/denies                        [facility] Adult PT Treatment/Exercise - 08/28/19 1706      Transfers   Transfers  Sit to Stand;Stand to Sit    Sit to Stand  5: Supervision    Stand to Sit  5: Supervision    Comments  Pt performed sit to stand x 10 from mat without UE support      Ambulation/Gait   Ambulation/Gait  Yes    Ambulation/Gait Assistance  5: Supervision;4: Min guard    Ambulation/Gait Assistance Details  HR=87, O2 sat=99%. Pt was able to demonstrate increased step length compared to last time. Verbal cues to try to relax arms some.    Ambulation Distance (Feet)  345 Feet    Assistive device  None    Gait Pattern  Step-through pattern;Decreased arm swing -  right;Decreased arm swing - left    Ambulation Surface  Level;Indoor      Neuro Re-ed    Neuro Re-ed Details   In // bars: side stepping without UE support 6' x 6, marching gait fowards then backwards gait 6' x 6 (occassional fingertip support with march), tandem gait with 1 UE support 6' x 4. Standing without UE support x 30 sec eyes open then 30 sec x 2 eyes closed CGA. Pt had 2 sways posterior but improved as she went on. Standing on blue mat feet apart eyes open x 30 sec then eyes closed x 30 sec then staggered stance x 30 sec each position. Legs a little shaky with staggered stance. HR=80 after. Pt was provided 2 seated rest breaks throughout. Standing with reaching across for trunk rotation x 10 each side. Standing with head turns left/right x 10. Pt reported some fuzzy vision/double vision raiting 6/10. Resided pretty  quickly when she sat.              PT Education - 08/28/19 2023    Education Details  Pt to continue with current HEP.    Person(s) Educated  Patient;Child(ren)    Methods  Explanation    Comprehension  Verbalized understanding       PT Short Term Goals - 08/16/19 1927      PT SHORT TERM GOAL #1   Title  Pt will be able to perform initial HEP with supervision of family for balance.    Time  4    Period  Weeks    Status  New    Target Date  09/15/19      PT SHORT TERM GOAL #2   Title  Pt will ambulate >300' on level surfaces with LRAD versus no device mod I for improved household mobility.    Time  4    Period  Weeks    Status  New    Target Date  09/15/19      PT SHORT TERM GOAL #3   Title  Pt will increase FGA from 7 to >12/30 for improved balance and gait safety.    Baseline  7/30 on 08/16/19    Time  4    Period  Weeks    Status  New    Target Date  09/15/19        PT Long Term Goals - 08/16/19 1929      PT LONG TERM GOAL #1   Title  Pt will be independent with progressive balance program for continued gains on own.    Time  8    Period  Weeks    Status  New    Target Date  10/15/19      PT LONG TERM GOAL #2   Title  Pt will ambulate >500' on varied surfaces independently for improved community mobility.    Time  8    Period  Weeks    Status  New    Target Date  10/15/19      PT LONG TERM GOAL #3   Title  Pt will decrease 5 x sit to stand from 20.39 sec to <16 sec from mat with hands for improved balance and functional strength.    Baseline  20.39 sec from mat with hands    Time  8    Period  Weeks    Status  New    Target Date  10/15/19      PT LONG TERM GOAL #4  Title  Pt will increase FGA from 7/30 to >19/30 for improved balance and gait safety.    Baseline  7/30 on 08/16/19    Time  8    Period  Weeks    Status  New    Target Date  10/15/19            Plan - 08/28/19 2023    Clinical Impression Statement  Pt with much improved  stability with activities today with less dizziness. Blood sugar was running better than last week prior to visit.    Personal Factors and Comorbidities  Comorbidity 3+    Comorbidities  DM2, HTN, morbid obesity, a-fib    Examination-Activity Limitations  Stairs;Transfers;Locomotion Level    Examination-Participation Restrictions  Community Activity;Cleaning    Stability/Clinical Decision Making  Evolving/Moderate complexity    Rehab Potential  Good    PT Frequency  2x / week    PT Duration  8 weeks    PT Treatment/Interventions  ADLs/Self Care Home Management;Aquatic Therapy;DME Instruction;Therapeutic activities;Functional mobility training;Stair training;Gait training;Therapeutic exercise;Balance training;Neuromuscular re-education;Vestibular;Patient/family education    PT Next Visit Plan  How is blood sugar doing? gait training, balance exercises. Monitor HR. Can we add in balance exercises if showing some consistency in presentation.    Consulted and Agree with Plan of Care  Patient;Family member/caregiver    Family Member Consulted  son, Kit       Patient will benefit from skilled therapeutic intervention in order to improve the following deficits and impairments:  Abnormal gait, Decreased activity tolerance, Decreased balance, Decreased mobility, Decreased knowledge of use of DME  Visit Diagnosis: Other abnormalities of gait and mobility  Muscle weakness (generalized)  Unsteadiness on feet     Problem List Patient Active Problem List   Diagnosis Date Noted  . Paroxysmal atrial fibrillation ([place]) 08/08/2019  . Secondary hypercoagulable state ([place]) 08/08/2019  . Acute cerebrovascular accident of cerebellum ([place]) 07/31/2019  . GERD (gastroesophageal reflux disease)   . Leukocytosis   . Morbid obesity ([place]) 01/25/2018  . Chest pain with low risk for cardiac etiology 12/31/2016  . Essential hypertension 12/31/2016  . Hyperlipidemia 12/31/2016  . DM (diabetes mellitus), type  2 with complications ([place]) 28/20/6015    Electa Sniff, PT, DPT, NCS 08/28/2019, 8:25 PM  [facility] [address] [place], [place], [address] Phone: [phone]   Fax:  [phone]  Name: [name] MRN: [id number] Date of Birth: [birth year]

## 2019-08-30 ENCOUNTER — Other Ambulatory Visit: Payer: Self-pay

## 2019-08-30 ENCOUNTER — Ambulatory Visit: Payer: Medicare Other

## 2019-08-30 DIAGNOSIS — R2681 Unsteadiness on feet: Secondary | ICD-10-CM

## 2019-08-30 DIAGNOSIS — R2689 Other abnormalities of gait and mobility: Secondary | ICD-10-CM | POA: Diagnosis not present

## 2019-08-30 DIAGNOSIS — M6281 Muscle weakness (generalized): Secondary | ICD-10-CM

## 2019-08-30 NOTE — Patient Instructions (Signed)
Access Code: [email or web address] URL: [email or web address] Date: 08/30/2019 Prepared by: Cherly Anderson  Exercises Sit to Stand - 2 x daily - 7 x weekly - 2 sets - 5 reps Standing eyes open - 2 x daily - 7 x weekly - 1 sets - 3 reps - 30 sec hold Standing Romberg to 1/2 Tandem Stance - 2 x daily - 7 x weekly - 1 sets - 3 reps - 30 sec hold Standing with Head Rotation - 2 x daily - 7 x weekly - 2 sets - 5 reps

## 2019-08-30 NOTE — Therapy (Signed)
[facility] [address] [place], [place], [address] Phone: [phone]   Fax:  [phone]  Physical Therapy Treatment  Patient Details  Name: [name] MRN: [id number] Date of Birth: [birth year] Referring Provider (PT): Frann Rider will be following   Encounter Date: 08/30/2019  PT End of Session - 08/30/19 1622    Visit Number  5    Number of Visits  17    Date for PT Re-Evaluation  90/30/09   90 day cert, 60 day poc   Authorization Type  [employer] medicare so 10th visit progress note, FOTO    PT Start Time  1616    PT Stop Time  1655    PT Time Calculation (min)  39 min    Equipment Utilized During Treatment  Gait belt    Activity Tolerance  Patient tolerated treatment well    Behavior During Therapy  WFL for tasks assessed/performed       Past Medical History:  Diagnosis Date  . Actinic keratosis    Followed by Dr. Allyson Sabal  . Anxiety   . Diabetes mellitus, type II, insulin dependent ([place])    Associated with obesity: She takes Lantus, Victoza & Actos  . Essential hypertension   . GERD (gastroesophageal reflux disease)   . Hyperlipidemia   . Morbid obesity with BMI of 40.0-44.9, adult [facility])     Past Surgical History:  Procedure Laterality Date  . BREAST BIOPSY  1997  . LEFT HEART CATH AND CORONARY ANGIOGRAPHY N/A 01/05/2017   Procedure: LEFT HEART CATH AND CORONARY ANGIOGRAPHY;  Surgeon: Leonie Man, MD;  Location: [facility];  Service: Cardiovascular: Angiographically normal coronary arteries with a right dominant system.  Normal EF 55-65%.  Marland Kitchen LOOP RECORDER INSERTION N/A 08/01/2019   Procedure: LOOP RECORDER INSERTION;  Surgeon: Constance Haw, MD;  Location: [place] [facility];  Service: Cardiovascular;  Laterality: N/A;    There were no vitals filed for this visit.  Subjective Assessment - 08/30/19 1622    Subjective  Pt reports that sugar was around 250 at 2pm today. She has  felt a little more wiggly the last 2 days.    Patient is accompained by:  Family member   son, Kit   Pertinent History  PMH: type 2 diabetes, uncontrolled with hyperglycemia currently not taking any treatment, hypertension, not taking any medication since at least Christmas time, morbid obesity, A-fib    Patient Stated Goals  Pt wants to get better so she can be independent.    Currently in Pain?  No/denies                        [facility] Adult PT Treatment/Exercise - 08/30/19 1623      Transfers   Transfers  Sit to Stand;Stand to Sit    Sit to Stand  5: Supervision    Sit to Stand Details (indicate cue type and reason)  Pt was cued to stand for a minute prior to moving away from chair if feeling lightheaded or unsteady.    Stand to Sit  5: Supervision      Ambulation/Gait   Ambulation/Gait  Yes    Ambulation/Gait Assistance  5: Supervision;4: Min guard    Ambulation/Gait Assistance Details  HR=90 after gait. Pt was cued to try to relax arms with gait to get more arm swing. Did improve some with second bout.    Ambulation Distance (Feet)  345 Feet  at end of session pt ambulated another 300'   Assistive device  None    Gait Pattern  Step-through pattern;Decreased arm swing - right;Decreased arm swing - left    Ambulation Surface  Level;Indoor      Neuro Re-ed    Neuro Re-ed Details   Standing without UE support with head turns left/right x 5 with cues to find a target each way to focus on, staggered stance x 30 sec each position. Along counter: side stepping, marching, gait with head turns left/right, gait forwards and backwards walk. CGA with all activities for safety.  Pt denies any dizziness just feeling unsteady.             PT Education - 08/30/19 1829    Education Details  Added balance exercises to perform in corner with family assist.    Person(s) Educated  Patient;Child(ren)    Methods  Explanation;Demonstration;Handout    Comprehension  Verbalized  understanding;Returned demonstration       PT Short Term Goals - 08/16/19 1927      PT SHORT TERM GOAL #1   Title  Pt will be able to perform initial HEP with supervision of family for balance.    Time  4    Period  Weeks    Status  New    Target Date  09/15/19      PT SHORT TERM GOAL #2   Title  Pt will ambulate >300' on level surfaces with LRAD versus no device mod I for improved household mobility.    Time  4    Period  Weeks    Status  New    Target Date  09/15/19      PT SHORT TERM GOAL #3   Title  Pt will increase FGA from 7 to >12/30 for improved balance and gait safety.    Baseline  7/30 on 08/16/19    Time  4    Period  Weeks    Status  New    Target Date  09/15/19        PT Long Term Goals - 08/16/19 1929      PT LONG TERM GOAL #1   Title  Pt will be independent with progressive balance program for continued gains on own.    Time  8    Period  Weeks    Status  New    Target Date  10/15/19      PT LONG TERM GOAL #2   Title  Pt will ambulate >500' on varied surfaces independently for improved community mobility.    Time  8    Period  Weeks    Status  New    Target Date  10/15/19      PT LONG TERM GOAL #3   Title  Pt will decrease 5 x sit to stand from 20.39 sec to <16 sec from mat with hands for improved balance and functional strength.    Baseline  20.39 sec from mat with hands    Time  8    Period  Weeks    Status  New    Target Date  10/15/19      PT LONG TERM GOAL #4   Title  Pt will increase FGA from 7/30 to >19/30 for improved balance and gait safety.    Baseline  7/30 on 08/16/19    Time  8    Period  Weeks    Status  New    Target Date  10/15/19  Plan - 08/30/19 1830    Clinical Impression Statement  Pt was able to show improvement in gait stability throughout session with relaxing arms more. Pt denied any dizziness just feeling unsteady with activities.    Personal Factors and Comorbidities  Comorbidity 3+     Comorbidities  DM2, HTN, morbid obesity, a-fib    Examination-Activity Limitations  Stairs;Transfers;Locomotion Level    Examination-Participation Restrictions  Community Activity;Cleaning    Stability/Clinical Decision Making  Evolving/Moderate complexity    Rehab Potential  Good    PT Frequency  2x / week    PT Duration  8 weeks    PT Treatment/Interventions  ADLs/Self Care Home Management;Aquatic Therapy;DME Instruction;Therapeutic activities;Functional mobility training;Stair training;Gait training;Therapeutic exercise;Balance training;Neuromuscular re-education;Vestibular;Patient/family education    PT Next Visit Plan  How is blood sugar doing? gait training, balance exercises. Monitor HR. How are balance exercises going?    Consulted and Agree with Plan of Care  Patient;Family member/caregiver    Family Member Consulted  son, Kit       Patient will benefit from skilled therapeutic intervention in order to improve the following deficits and impairments:  Abnormal gait, Decreased activity tolerance, Decreased balance, Decreased mobility, Decreased knowledge of use of DME  Visit Diagnosis: Other abnormalities of gait and mobility  Muscle weakness (generalized)  Unsteadiness on feet     Problem List Patient Active Problem List   Diagnosis Date Noted  . Paroxysmal atrial fibrillation ([place]) 08/08/2019  . Secondary hypercoagulable state ([place]) 08/08/2019  . Acute cerebrovascular accident of cerebellum ([place]) 07/31/2019  . GERD (gastroesophageal reflux disease)   . Leukocytosis   . Morbid obesity ([place]) 01/25/2018  . Chest pain with low risk for cardiac etiology 12/31/2016  . Essential hypertension 12/31/2016  . Hyperlipidemia 12/31/2016  . DM (diabetes mellitus), type 2 with complications ([place]) 76/80/8811    Electa Sniff, PT, DPT, NCS 08/30/2019, 6:32 PM  [facility] [address] [place], [place], [address] Phone:  [phone]   Fax:  [phone]  Name: [name] MRN: [id number] Date of Birth: Jan 20, 1946

## 2019-09-03 ENCOUNTER — Ambulatory Visit (INDEPENDENT_AMBULATORY_CARE_PROVIDER_SITE_OTHER): Payer: Medicare Other | Admitting: *Deleted

## 2019-09-03 DIAGNOSIS — I639 Cerebral infarction, unspecified: Secondary | ICD-10-CM

## 2019-09-03 LAB — CUP PACEART REMOTE DEVICE CHECK
Date Time Interrogation Session: 20210606190359
Implantable Pulse Generator Implant Date: 20210505

## 2019-09-04 ENCOUNTER — Ambulatory Visit: Payer: Medicare Other

## 2019-09-04 ENCOUNTER — Encounter: Payer: Self-pay | Admitting: Adult Health

## 2019-09-04 ENCOUNTER — Ambulatory Visit: Payer: Medicare Other | Admitting: Adult Health

## 2019-09-04 ENCOUNTER — Other Ambulatory Visit: Payer: Self-pay

## 2019-09-04 ENCOUNTER — Telehealth: Payer: Self-pay

## 2019-09-04 VITALS — BP 124/61 | HR 65 | Ht 60.0 in | Wt 203.0 lb

## 2019-09-04 DIAGNOSIS — R2689 Other abnormalities of gait and mobility: Secondary | ICD-10-CM | POA: Diagnosis not present

## 2019-09-04 DIAGNOSIS — I69398 Other sequelae of cerebral infarction: Secondary | ICD-10-CM | POA: Diagnosis not present

## 2019-09-04 DIAGNOSIS — I639 Cerebral infarction, unspecified: Secondary | ICD-10-CM | POA: Diagnosis not present

## 2019-09-04 DIAGNOSIS — I4891 Unspecified atrial fibrillation: Secondary | ICD-10-CM

## 2019-09-04 DIAGNOSIS — M6281 Muscle weakness (generalized): Secondary | ICD-10-CM

## 2019-09-04 DIAGNOSIS — I10 Essential (primary) hypertension: Secondary | ICD-10-CM | POA: Diagnosis not present

## 2019-09-04 DIAGNOSIS — E1165 Type 2 diabetes mellitus with hyperglycemia: Secondary | ICD-10-CM

## 2019-09-04 DIAGNOSIS — R2681 Unsteadiness on feet: Secondary | ICD-10-CM

## 2019-09-04 DIAGNOSIS — E7800 Pure hypercholesterolemia, unspecified: Secondary | ICD-10-CM

## 2019-09-04 MED ORDER — CARVEDILOL 6.25 MG PO TABS: 6.2500 mg | ORAL_TABLET | Freq: Two times a day (BID) | ORAL | 3 refills | Status: DC

## 2019-09-04 NOTE — Therapy (Signed)
[facility] [address] [place], [place], [address] Phone: [phone]   Fax:  [phone]  Physical Therapy Treatment  Patient Details  Name: [name] MRN: [id number] Date of Birth: 09/12/1945 Referring Provider (PT): Frann Rider will be following   Encounter Date: 09/04/2019  PT End of Session - 09/04/19 1107    Visit Number  6    Number of Visits  17    Date for PT Re-Evaluation  65/53/74   90 day cert, 60 day poc   Authorization Type  [employer] medicare so 10th visit progress note, FOTO    PT Start Time  1102    PT Stop Time  1143    PT Time Calculation (min)  41 min    Equipment Utilized During Treatment  Gait belt    Activity Tolerance  Patient tolerated treatment well    Behavior During Therapy  WFL for tasks assessed/performed       Past Medical History:  Diagnosis Date  . Actinic keratosis    Followed by Dr. Allyson Sabal  . Anxiety   . Diabetes mellitus, type II, insulin dependent ([place])    Associated with obesity: She takes Lantus, Victoza & Actos  . Essential hypertension   . GERD (gastroesophageal reflux disease)   . Hyperlipidemia   . Morbid obesity with BMI of 40.0-44.9, adult [facility])     Past Surgical History:  Procedure Laterality Date  . BREAST BIOPSY  1997  . LEFT HEART CATH AND CORONARY ANGIOGRAPHY N/A 01/05/2017   Procedure: LEFT HEART CATH AND CORONARY ANGIOGRAPHY;  Surgeon: Leonie Man, MD;  Location: [facility];  Service: Cardiovascular: Angiographically normal coronary arteries with a right dominant system.  Normal EF 55-65%.  Marland Kitchen LOOP RECORDER INSERTION N/A 08/01/2019   Procedure: LOOP RECORDER INSERTION;  Surgeon: Constance Haw, MD;  Location: [facility];  Service: Cardiovascular;  Laterality: N/A;    There were no vitals filed for this visit.  Subjective Assessment - 09/04/19 1106    Subjective  Pt saw neurologist this morning and visit went well. Pt  feeling good today. Reports that she was very unsteady this weekend and was using her walker. Not using today. Blood sugar was 280 this morning and has taken her insulin.    Patient is accompained by:  Family member   son, Kit   Pertinent History  PMH: type 2 diabetes, uncontrolled with hyperglycemia currently not taking any treatment, hypertension, not taking any medication since at least Christmas time, morbid obesity, A-fib    Patient Stated Goals  Pt wants to get better so she can be independent.    Currently in Pain?  No/denies                        [facility] Adult PT Treatment/Exercise - 09/04/19 1108      Transfers   Transfers  Sit to Stand;Stand to Sit    Sit to Stand  5: Supervision    Stand to Sit  5: Supervision      Ambulation/Gait   Ambulation/Gait  Yes    Ambulation/Gait Assistance  5: Supervision    Ambulation/Gait Assistance Details  Pt was cued to try to relax arms and increase step length.    Ambulation Distance (Feet)  300 Feet    Assistive device  None    Gait Pattern  Step-through pattern;Decreased arm swing - right;Decreased arm swing - left    Ambulation Surface  Level;Indoor    Gait Comments  Pt performed gait with searching for 6 cones x 2. She missed 1 cone on first lap and the first bout and 2 the 2nd bout. Was able to find remaining on her 2nd lap both times. Dizziness reported when reaching up high and looking up to get 1 cone. 107' with each bout.      Neuro Re-ed    Neuro Re-ed Details   At counter: marching gait 6' x 6 with occasional 1 UE support close SBA, gait with head turns left/right 6' x 6, staggered stance 30 sec each position then tandem stance each position x 30 sec. Pt had to touch to get position but then able to hold close SBA/CGA. Stepping strategy practice x 10 posterior each leg and x 10 to each side without UE support. Reaching  in to overhead cabinet for 2 cones bringing them down and then putting them back up x 3 bouts. Pt  reported some dizziness first time but none as she went on. Standing feet together without UE support x 30 sec then eyes closed 30 sec x 2.              PT Education - 09/04/19 1636    Education Details  Pt to continue with current HEP    Person(s) Educated  Patient    Methods  Explanation    Comprehension  Verbalized understanding       PT Short Term Goals - 08/16/19 1927      PT SHORT TERM GOAL #1   Title  Pt will be able to perform initial HEP with supervision of family for balance.    Time  4    Period  Weeks    Status  New    Target Date  09/15/19      PT SHORT TERM GOAL #2   Title  Pt will ambulate >300' on level surfaces with LRAD versus no device mod I for improved household mobility.    Time  4    Period  Weeks    Status  New    Target Date  09/15/19      PT SHORT TERM GOAL #3   Title  Pt will increase FGA from 7 to >12/30 for improved balance and gait safety.    Baseline  7/30 on 08/16/19    Time  4    Period  Weeks    Status  New    Target Date  09/15/19        PT Long Term Goals - 08/16/19 1929      PT LONG TERM GOAL #1   Title  Pt will be independent with progressive balance program for continued gains on own.    Time  8    Period  Weeks    Status  New    Target Date  10/15/19      PT LONG TERM GOAL #2   Title  Pt will ambulate >500' on varied surfaces independently for improved community mobility.    Time  8    Period  Weeks    Status  New    Target Date  10/15/19      PT LONG TERM GOAL #3   Title  Pt will decrease 5 x sit to stand from 20.39 sec to <16 sec from mat with hands for improved balance and functional strength.    Baseline  20.39 sec from mat with hands    Time  8  Period  Weeks    Status  New    Target Date  10/15/19      PT LONG TERM GOAL #4   Title  Pt will increase FGA from 7/30 to >19/30 for improved balance and gait safety.    Baseline  7/30 on 08/16/19    Time  8    Period  Weeks    Status  New    Target Date   10/15/19            Plan - 09/04/19 1637    Clinical Impression Statement  Pt had improved stability with activities today. She was able to tolerate more exercises with less rest breaks. Pt did well with scanning more with gait. Stil lacking arm swing.    Personal Factors and Comorbidities  Comorbidity 3+    Comorbidities  DM2, HTN, morbid obesity, a-fib    Examination-Activity Limitations  Stairs;Transfers;Locomotion Level    Examination-Participation Restrictions  Community Activity;Cleaning    Stability/Clinical Decision Making  Evolving/Moderate complexity    Rehab Potential  Good    PT Frequency  2x / week    PT Duration  8 weeks    PT Treatment/Interventions  ADLs/Self Care Home Management;Aquatic Therapy;DME Instruction;Therapeutic activities;Functional mobility training;Stair training;Gait training;Therapeutic exercise;Balance training;Neuromuscular re-education;Vestibular;Patient/family education    PT Next Visit Plan  gait training, balance exercise progression. Monitor HR.    Consulted and Agree with Plan of Care  Patient;Family member/caregiver    Family Member Consulted  son, Kit       Patient will benefit from skilled therapeutic intervention in order to improve the following deficits and impairments:  Abnormal gait, Decreased activity tolerance, Decreased balance, Decreased mobility, Decreased knowledge of use of DME  Visit Diagnosis: Other abnormalities of gait and mobility  Muscle weakness (generalized)  Unsteadiness on feet     Problem List Patient Active Problem List   Diagnosis Date Noted  . Paroxysmal atrial fibrillation ([place]) 08/08/2019  . Secondary hypercoagulable state ([place]) 08/08/2019  . Acute cerebrovascular accident of cerebellum ([place]) 07/31/2019  . GERD (gastroesophageal reflux disease)   . Leukocytosis   . Morbid obesity ([place]) 01/25/2018  . Chest pain with low risk for cardiac etiology 12/31/2016  . Essential hypertension 12/31/2016  .  Hyperlipidemia 12/31/2016  . DM (diabetes mellitus), type 2 with complications ([place]) 02/11/5207    Electa Sniff, PT, DPT, NCS 09/04/2019, 4:39 PM  [facility] [address] [place], [place], [address] Phone: [phone]   Fax:  [phone]  Name: Annalicia Renfrew MRN: [id number] Date of Birth: May 16, 1945

## 2019-09-04 NOTE — Patient Instructions (Signed)
Continue physical therapy for ongoing improvement - you are doing great -- keep up the good work!   Continue Eliquis (apixaban) daily  and atorvastatin 40mg   for secondary stroke prevention  Trial use of CoQ10 200mg  daily for muscle aches and pain - if pains continue, please let us know or your PCP and we can switch to a different cholesterol medications  Continue to follow up with PCP regarding cholesterol, blood pressure and diabetes management   Continue to monitor blood pressure at home  Maintain strict control of hypertension with blood pressure goal below 130/90, diabetes with hemoglobin A1c goal below 6.5% and cholesterol with LDL cholesterol (bad cholesterol) goal below 70 mg/dL. I also advised the patient to eat a healthy diet with plenty of whole grains, cereals, fruits and vegetables, exercise regularly and maintain ideal body weight.  Followup in the future with me in 3 months or call earlier if needed       Thank you for coming to see [place] at [facility]. I hope we have been able to provide you high quality care today.  You may receive a patient satisfaction survey over the next few weeks. We would appreciate your feedback and comments so that we may continue to improve ourselves and the health of our patients.

## 2019-09-04 NOTE — Progress Notes (Signed)
[facility] [address] [place]. [place] [address] [phone]       HOSPITAL FOLLOW UP NOTE  [name] Date of Birth:  08-05-1945 Medical Record Number:  [id number]   Reason for Referral:  hospital stroke follow up    SUBJECTIVE:   CHIEF COMPLAINT:  Chief Complaint  Patient presents with  . Follow-up    tx rm here for a stroke f/u. Pt is having no new sx and is here wth her son.    HPI:   [name] is a 74 y.o. female with history of diabetes, obesity, hypertension, hyperlipidemia, off-and-on dizziness for many months to years  who presented on 07/31/2019 with unresolved dizziness.  Stroke work-up revealed multiple small right cerebellar infarcts embolic secondary to unclear source.  CTA negative LVO but did show R>L ICA siphon mild to moderate multifocal narrowing.  Loop recorder placed on 08/01/2019 for possible atrial fibrillation as stroke etiology.  Initiated DAPT for 3 weeks and aspirin alone.  HTN stable.  LDL 118 initiate atorvastatin 40 mg daily.  Uncontrolled DM with A1c 10.4.  Other stroke risk factors include advanced age, morbid obesity and family history of stroke but no personal history of stroke.  Evaluated by therapy and recommended outpatient PT and discharged home in stable condition.  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  CT head No acute abnormality. Atrophy.   MRI  Multiple small R cerebellar infarcts. Minor small vessel disease.   CTA head & neck no LVO. R>L ICA siphon mild to moderate multifocal narrowing.    LE Doppler  No DVT  2D Echo EF 60-65%. No source of embolus   Loop recorder placed 08/01/2019  LDL 118 -initiate atorvastatin 40 mg daily  HgbA1c 10.4  SCDs DVT for VTE prophylaxis  No antithrombotic prior to admission, now on aspirin 81 mg daily and clopidogrel 75 mg daily. Continue DAPT x 3 weeks then aspirin alone    Therapy recommendations:  OP  PT  Disposition:  Return home  Today, 09/04/2019, [name] is being seen for hospital follow-up accompanied by her son.  Continues to have gait impairment with unsteadiness but endorses ongoing improvement with participation in outpatient PT. Reports symptoms can wax and wane but overall improvement.  She also reports occasional dizziness with blurred vision which has been present prior to her stroke and has ophthalmology follow-up in the near future.  She is currently living in her own home but does have supervision during the day. Loop recorder did not show evidence of atrial fibrillation on 08/06/2019 with CHA2DS2-VASc score 6 and has since been initiated on Eliquis 5 mg twice daily by cardiology with discontinuation of aspirin and Plavix.  She has been tolerating Eliquis without bleeding or bruising.  Continues on atorvastatin 40 mg daily with mild extremity cramping.  Blood pressure today 124/61.  Monitors at home and son reports fluctuation with SBP range 120-160.  Glucose levels routinely monitored at home with recent adjustments to insulin regimen.  He continues to follow with PCP for HTN, HLD and DM management.  No further concerns at this time.    ROS:   14 system review of systems performed and negative with exception of imbalance, unsteadiness and pain  PMH:  Past Medical History:  Diagnosis Date  . Actinic keratosis    Followed by Dr. Allyson Sabal  . Anxiety   . Diabetes mellitus, type II, insulin dependent ([place])    Associated with obesity:  She takes Lantus, Victoza & Actos  . Essential hypertension   . GERD (gastroesophageal reflux disease)   . Hyperlipidemia   . Morbid obesity with BMI of 40.0-44.9, adult ([facility])     PSH:  Past Surgical History:  Procedure Laterality Date  . BREAST BIOPSY  1997  . LEFT HEART CATH AND CORONARY ANGIOGRAPHY N/A 01/05/2017   Procedure: LEFT HEART CATH AND CORONARY ANGIOGRAPHY;  Surgeon: Leonie Man, MD;  Location: [facility];   Service: Cardiovascular: Angiographically normal coronary arteries with a right dominant system.  Normal EF 55-65%.  Marland Kitchen LOOP RECORDER INSERTION N/A 08/01/2019   Procedure: LOOP RECORDER INSERTION;  Surgeon: Constance Haw, MD;  Location: [place] [facility];  Service: Cardiovascular;  Laterality: N/A;    Social History:  Social History   Socioeconomic History  . Marital status: Widowed    Spouse name: Not on file  . Number of children: 3  . Years of education: 9  . Highest education level: Not on file  Occupational History    Comment: Retired  Tobacco Use  . Smoking status: Never Smoker  . Smokeless tobacco: Never Used  Substance and Sexual Activity  . Alcohol use: No  . Drug use: No  . Sexual activity: Not Currently  Other Topics Concern  . Not on file  Social History Narrative   She is a widowed mother of 3 with 5 grandchildren. She lives with one of her daughters and grandson. She is a coming by her sister. She tries to walk, but does not do it regularly.   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   Social Determinants of Health   Financial Resource Strain:   . Difficulty of Paying Living Expenses:   Food Insecurity:   . Worried About Charity fundraiser in the Last Year:   . Arboriculturist in the Last Year:   Transportation Needs:   . Film/video editor (Medical):   Marland Kitchen Lack of Transportation (Non-Medical):   Physical Activity:   . Days of Exercise per Week:   . Minutes of Exercise per Session:   Stress:   . Feeling of Stress :   Social Connections:   . Frequency of Communication with Friends and Family:   . Frequency of Social Gatherings with Friends and Family:   . Attends Religious Services:   . Active Member of Clubs or Organizations:   . Attends Archivist Meetings:   Marland Kitchen Marital Status:   Intimate Partner Violence:   . Fear of Current or Ex-Partner:   . Emotionally Abused:   Marland Kitchen Physically Abused:   . Sexually Abused:      Family History:  Family History  Problem Relation Age of Onset  . Diabetes Mother   . Hyperlipidemia Mother   . Stroke Mother 58  . Liver cancer Father   . Hypertension Sister   . Hyperlipidemia Sister   . Diabetes Sister        On insulin  . Depression Sister   . Kidney disease Brother        After dialysis he had renal transplant  . Diabetes Brother   . Coronary artery disease Brother   . Peripheral Artery Disease Brother   . Stroke Brother 22  . Bladder Cancer Brother   . Diabetes Brother     Medications:   Current Outpatient Medications on File Prior to Visit  Medication Sig Dispense Refill  . apixaban (ELIQUIS) 5  MG TABS tablet Take 1 tablet (5 mg total) by mouth 2 (two) times daily. 60 tablet 3  . atorvastatin (LIPITOR) 40 MG tablet Take 1 tablet (40 mg total) by mouth daily. 30 tablet 2  . blood glucose meter kit and supplies KIT Dispense based on patient and insurance preference. Use up to four times daily as directed. (FOR ICD-9 250.00, 250.01). 1 each 0  . Insulin NPH, Human,, Isophane, (NOVOLIN N FLEXPEN RELION) 100 UNIT/ML Kiwkpen Inject 10 Units into the skin 2 (two) times daily at 8 am and 10 pm. (Patient taking differently: Inject 15 Units into the skin 2 (two) times daily at 8 am and 10 pm. 15 units at morning and 17 units at night.) 15 mL 11  . Insulin Syringe-Needle U-100 (RELION INSULIN SYR .3CC/29G) 29G X 1/2" 0.3 ML MISC 1 each by Does not apply route in the morning and at bedtime. 100 each 0  . Lancets (ONETOUCH DELICA PLUS [id number]) MISC USE 1 LANCET TO CHECK GLUCOSE UP TO 4 TIMES DAILY    . lisinopril-hydrochlorothiazide (PRINZIDE,ZESTORETIC) 20-12.5 MG tablet Take 1 tablet by mouth daily.    . Naproxen Sod-Diphenhydramine (ALEVE PM) 220-25 MG TABS Take 2 tablets by mouth at bedtime as needed (sleep).    Glory Rosebush VERIO test strip USE 1 STRIP TO CHECK GLUCOSE UP TO 4 TIMES DAILY AS DIRECTED    . pioglitazone (ACTOS) 30 MG tablet Take 30 mg by mouth  every evening.     Marland Kitchen RELION PEN NEEDLES 31G X 6 MM MISC USE 1 IN THE MORNING AND AT BEDTIME    . nitroGLYCERIN (NITROSTAT) 0.4 MG SL tablet Place 1 tablet (0.4 mg total) under the tongue every 5 (five) minutes as needed for chest pain. 25 tablet 5   No current facility-administered medications on file prior to visit.    Allergies:   Allergies  Allergen Reactions  . Livalo [Pitavastatin]     MYALGIAS   . Sulfa Antibiotics Itching    Itching and red spots.      OBJECTIVE:  Physical Exam  Vitals:   09/04/19 0921  BP: 124/61  Pulse: 65  Weight: 203 lb (92.1 kg)  Height: 5' (1.524 m)   Body mass index is 39.65 kg/m. No exam data present  General: well developed, well nourished,  pleasant elderly Caucasian female, seated, in no evident distress Head: head normocephalic and atraumatic.   Neck: supple with no carotid or supraclavicular bruits Cardiovascular: regular rate and rhythm, no murmurs Musculoskeletal: no deformity Skin:  no rash/petichiae Vascular:  Normal pulses all extremities   Neurologic Exam Mental Status: Awake and fully alert.   Fluent speech and language.  Oriented to place and time. Recent and remote memory intact. Attention span, concentration and fund of knowledge appropriate. Mood and affect appropriate.  Cranial Nerves: Fundoscopic exam reveals sharp disc margins. Pupils equal, briskly reactive to light. Extraocular movements full without nystagmus. Visual fields full to confrontation. Hearing intact. Facial sensation intact. Face, tongue, palate moves normally and symmetrically.  Motor: Normal bulk and tone. Normal strength in all tested extremity muscles. Sensory.: intact to touch , pinprick , position and vibratory sensation.  Coordination: Rapid alternating movements normal in all extremities. Finger-to-nose and heel-to-shin performed accurately bilaterally. Gait and Station: Arises from chair without difficulty. Stance is normal. Gait demonstrates  normal stride length with mild imbalance and gait unsteadiness greater with making turns, attempting tandem walk or standing on single leg.  Ambulates without assistive device. Reflexes: 1+ and  symmetric. Toes downgoing.     NIHSS  0 Modified Rankin  2 CHA2DS2-VASc Score 6 (?2 oral anticoagulation recommended) Age in Years:  +1  Sex:  +1 Hypertension History:  +1  Diabetes Mellitus:   +1 Congestive Heart Failure History:  0             Vascular Disease History (prior MI, aortic arthrosclerosis, PAD):   0                        Stroke/TIA/Thromboembolism History:  +2 HAS BLED 2     ASSESSMENT: [name] is a 74 y.o. year old female with off-and-on dizziness for months to years who presented on 07/31/2019 with unresolved dizziness with stroke work-up revealing multiple small right cerebellar infarcts embolic secondary to unclear source.  Loop recorder placed and showed evidence of atrial fibrillation 5 days later with initiation of Eliquis.  Vascular risk factors include HTN, HLD, uncontrolled diabetes and obesity.  Residual deficits imbalance and gait unsteadiness.  Continues to experience occasional dizziness with blurred vision which has been present prior to stroke.     PLAN:  1. Cryptogenic R cerebellar strokes:  -Imbalance, poststroke: ongoing participation in outpatient therapies for ongoing improvement -Continue Eliquis (apixaban) daily  and atorvastatin 40 mg daily for secondary stroke prevention. Maintain strict control of hypertension with blood pressure goal below 130/90, diabetes with hemoglobin A1c goal below 6.5% and cholesterol with LDL cholesterol (bad cholesterol) goal below 70 mg/dL.  I also advised the patient to eat a healthy diet with plenty of whole grains, cereals, fruits and vegetables, exercise regularly with at least 30 minutes of continuous activity daily and maintain ideal body weight. 2. New dx atrial fibrillation: Discovered on loop recorder.   Continuation of Eliquis continue to follow with cardiology for prescribing, monitoring and management 3. HTN: Stable at today's visit.  Advised to speak further with PCP regarding fluctuation of blood pressure readings and ongoing monitoring management 4. HLD: Continuation of atorvastatin and recommend trialing co-Q10 for possible benefit of mild myalgias.  If no benefit or continues to experience, would recommend trialing use of Crestor for management. 5. DMII: Fluctuating levels per patient and son.  Advised to continue to follow with PCP for monitoring and management and may possibly benefit from evaluation by endocrinology    Follow up in 3 months or call earlier if needed   I spent 45 minutes of face-to-face and non-face-to-face time with patient and son.  This included previsit chart review, lab review, study review, order entry, electronic health record documentation, patient education regarding recent stroke, residual deficits, importance of managing stroke risk factors and answered all questions to patient satisfaction   Frann Rider, [facility]  [facility] [address] [place], [place] [address]  Phone [phone] Fax [phone] Note: This document was prepared with digital dictation and possible smart phrase technology. Any transcriptional errors that result from this process are unintentional.

## 2019-09-04 NOTE — Telephone Encounter (Signed)
-----   Message from Will Meredith Leeds, MD sent at 09/03/2019  7:51 AM EDT ----- Abnormal LINQ reviewed. Notable for short episodes of rapid AF. Increase coreg to 6.25 mg.

## 2019-09-04 NOTE — Telephone Encounter (Signed)
Reviewed results with patient who verbalized understanding.   Instructed patient to INCREASE COREG to 6.25 mg BID. She was grateful for call and agrees with treatment plan.

## 2019-09-05 NOTE — Progress Notes (Signed)
[facility] Summary Report / Loop Recorder 

## 2019-09-06 ENCOUNTER — Ambulatory Visit: Payer: Medicare Other

## 2019-09-06 ENCOUNTER — Other Ambulatory Visit: Payer: Self-pay

## 2019-09-06 VITALS — BP 142/88 | HR 72

## 2019-09-06 DIAGNOSIS — R2681 Unsteadiness on feet: Secondary | ICD-10-CM

## 2019-09-06 DIAGNOSIS — M6281 Muscle weakness (generalized): Secondary | ICD-10-CM

## 2019-09-06 DIAGNOSIS — R2689 Other abnormalities of gait and mobility: Secondary | ICD-10-CM | POA: Diagnosis not present

## 2019-09-06 NOTE — Patient Instructions (Signed)
Access Code: [email or web address] URL: [email or web address] Date: 09/06/2019 Prepared by: Cherly Anderson  Exercises Sit to Stand - 2 x daily - 7 x weekly - 2 sets - 5 reps Standing eyes open - 2 x daily - 7 x weekly - 1 sets - 3 reps - 30 sec hold Standing Romberg to 1/2 Tandem Stance - 2 x daily - 7 x weekly - 1 sets - 3 reps - 30 sec hold Standing with Head Rotation - 2 x daily - 7 x weekly - 2 sets - 5 reps Walking March - 1 x daily - 5 x weekly - 1 sets - 4 reps Side Stepping with Counter Support - 1 x daily - 5 x weekly - 1 sets - 4 reps

## 2019-09-06 NOTE — Therapy (Signed)
[facility] [address] [place], [place], [address] Phone: [phone]   Fax:  [phone]  Physical Therapy Treatment  Patient Details  Name: [name] MRN: [id number] Date of Birth: June 28, 1945 Referring Provider (PT): Frann Rider will be following   Encounter Date: 09/06/2019   PT End of Session - 09/06/19 1659    Visit Number 7    Number of Visits 17    Date for PT Re-Evaluation 29/47/65   90 day cert, 60 day poc   Authorization Type [employer] medicare so 10th visit progress note, FOTO    PT Start Time 1616    PT Stop Time 1658    PT Time Calculation (min) 42 min    Equipment Utilized During Treatment Gait belt    Activity Tolerance Patient tolerated treatment well    Behavior During Therapy WFL for tasks assessed/performed           Past Medical History:  Diagnosis Date  . Actinic keratosis    Followed by Dr. Allyson Sabal  . Anxiety   . Diabetes mellitus, type II, insulin dependent ([place])    Associated with obesity: She takes Lantus, Victoza & Actos  . Essential hypertension   . GERD (gastroesophageal reflux disease)   . Hyperlipidemia   . Morbid obesity with BMI of 40.0-44.9, adult [facility])     Past Surgical History:  Procedure Laterality Date  . BREAST BIOPSY  1997  . LEFT HEART CATH AND CORONARY ANGIOGRAPHY N/A 01/05/2017   Procedure: LEFT HEART CATH AND CORONARY ANGIOGRAPHY;  Surgeon: Leonie Man, MD;  Location: [facility];  Service: Cardiovascular: Angiographically normal coronary arteries with a right dominant system.  Normal EF 55-65%.  Marland Kitchen LOOP RECORDER INSERTION N/A 08/01/2019   Procedure: LOOP RECORDER INSERTION;  Surgeon: Constance Haw, MD;  Location: [facility];  Service: Cardiovascular;  Laterality: N/A;    Vitals:   09/06/19 1623  BP: (!) 142/88  Pulse: 72  SpO2: 96%     Subjective Assessment - 09/06/19 1618    Subjective Pt reports that her cardiologist  called her Tuesday and said that her loop recorder picked up on heart rate being off so they upped her carvedilol. She reports she felt really good on Tuesday until later in the afternoon and then did not feel great all day yesterday just feeling unsteady. Feeling better today and has been able to walk on her own.    Patient is accompained by: Family member   son, Kit   Pertinent History PMH: type 2 diabetes, uncontrolled with hyperglycemia currently not taking any treatment, hypertension, not taking any medication since at least Christmas time, morbid obesity, A-fib    Patient Stated Goals Pt wants to get better so she can be independent.    Currently in Pain? No/denies                             [facility] Adult PT Treatment/Exercise - 09/06/19 1627      Ambulation/Gait   Ambulation/Gait Yes    Ambulation/Gait Assistance 5: Supervision    Ambulation/Gait Assistance Details HR=90 after. Pt was cued to try to relax arms to get more arm swing.    Ambulation Distance (Feet) 460 Feet    Assistive device None    Gait Pattern Step-through pattern;Decreased arm swing - right;Decreased arm swing - left    Ambulation Surface Level;Indoor      Neuro Re-ed  Neuro Re-ed Details  Pt performed gait weaving in and out of 4 cones x 6 times close SBA. Pt had no LOB with activity. No dizziness reported . Gait over blue mat 6' x 6 with 180 degree turn on mat CGA, marching gait on blue mat 6' x 8 with 1 hand held assist. Standing on blue mat with alternating toe taps on cone with 1 hand held assist x 10 bilateral. Sit to stand x 5 from mat without hands then added blue mat folded over under feet and performed 5 x 2 without hands getting balance each time. No dizziness with activities.                  PT Education - 09/06/19 2012    Education Details Added counter exercises to HEP. Pt was instructed to monitor BP, HR and blood sugars is starts to feel really unsteady again. PT showed pt  how to check radial pulse and instructed in normal HR parameters.    Person(s) Educated Patient;Child(ren)    Methods Explanation;Demonstration;Handout    Comprehension Verbalized understanding            PT Short Term Goals - 08/16/19 1927      PT SHORT TERM GOAL #1   Title Pt will be able to perform initial HEP with supervision of family for balance.    Time 4    Period Weeks    Status New    Target Date 09/15/19      PT SHORT TERM GOAL #2   Title Pt will ambulate >300' on level surfaces with LRAD versus no device mod I for improved household mobility.    Time 4    Period Weeks    Status New    Target Date 09/15/19      PT SHORT TERM GOAL #3   Title Pt will increase FGA from 7 to >12/30 for improved balance and gait safety.    Baseline 7/30 on 08/16/19    Time 4    Period Weeks    Status New    Target Date 09/15/19             PT Long Term Goals - 08/16/19 1929      PT LONG TERM GOAL #1   Title Pt will be independent with progressive balance program for continued gains on own.    Time 8    Period Weeks    Status New    Target Date 10/15/19      PT LONG TERM GOAL #2   Title Pt will ambulate >500' on varied surfaces independently for improved community mobility.    Time 8    Period Weeks    Status New    Target Date 10/15/19      PT LONG TERM GOAL #3   Title Pt will decrease 5 x sit to stand from 20.39 sec to <16 sec from mat with hands for improved balance and functional strength.    Baseline 20.39 sec from mat with hands    Time 8    Period Weeks    Status New    Target Date 10/15/19      PT LONG TERM GOAL #4   Title Pt will increase FGA from 7/30 to >19/30 for improved balance and gait safety.    Baseline 7/30 on 08/16/19    Time 8    Period Weeks    Status New    Target Date 10/15/19  Plan - 09/06/19 2012    Clinical Impression Statement Pt continues to show improving tolerance to balance and gait activities. She was  most unsteady with increased SLS time and with looking up.    Personal Factors and Comorbidities Comorbidity 3+    Comorbidities DM2, HTN, morbid obesity, a-fib    Examination-Activity Limitations Stairs;Transfers;Locomotion Level    Examination-Participation Restrictions Community Activity;Cleaning    Stability/Clinical Decision Making Evolving/Moderate complexity    Rehab Potential Good    PT Frequency 2x / week    PT Duration 8 weeks    PT Treatment/Interventions ADLs/Self Care Home Management;Aquatic Therapy;DME Instruction;Therapeutic activities;Functional mobility training;Stair training;Gait training;Therapeutic exercise;Balance training;Neuromuscular re-education;Vestibular;Patient/family education    PT Next Visit Plan gait training, balance exercise progression. Monitor HR.    Consulted and Agree with Plan of Care Patient;Family member/caregiver    Family Member Consulted son, Kit           Patient will benefit from skilled therapeutic intervention in order to improve the following deficits and impairments:  Abnormal gait, Decreased activity tolerance, Decreased balance, Decreased mobility, Decreased knowledge of use of DME  Visit Diagnosis: Other abnormalities of gait and mobility  Muscle weakness (generalized)  Unsteadiness on feet     Problem List Patient Active Problem List   Diagnosis Date Noted  . Paroxysmal atrial fibrillation ([place]) 08/08/2019  . Secondary hypercoagulable state ([place]) 08/08/2019  . Acute cerebrovascular accident of cerebellum ([place]) 07/31/2019  . GERD (gastroesophageal reflux disease)   . Leukocytosis   . Morbid obesity ([place]) 01/25/2018  . Chest pain with low risk for cardiac etiology 12/31/2016  . Essential hypertension 12/31/2016  . Hyperlipidemia 12/31/2016  . DM (diabetes mellitus), type 2 with complications ([place]) 63/33/5456    Electa Sniff, PT, DPT, NCS 09/06/2019, 8:16 PM  [facility] [address] [place], [place], [address] Phone: [phone]   Fax:  [phone]  Name: Nyimah Shadduck MRN: [id number] Date of Birth: Feb 20, 1946

## 2019-09-10 NOTE — Progress Notes (Signed)
Primary Care Physician: Orpah Melter, MD Primary Cardiologist: Dr Ellyn Hack Primary Electrophysiologist: Dr Curt Bears Referring Physician: Dr Evie Lacks Clack is a 74 y.o. female with a history of DM, HTN, GERD, HLD, prior CVA, and new onset paroxysmal atrial fibrillation who presents for follow up in the [facility]. 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 The device clinic received an alert for two, 4-minute episodes of afib on 08/06/19. Patient has a CHADS2VASC score of 6. She was unaware of her arrhythmia. She does state that since her stroke she has had issues with balance and nausea. She denies significant alcohol use or snoring.   On follow up today, patient reports doing well since her last visit. ILR shows no new episodes of afib. She is working with PT to improve her balance. She denies bleeding issues on anticoagulation.   Today, she denies symptoms of palpitations, chest pain, shortness of breath, orthopnea, PND, lower extremity edema, presyncope, syncope, snoring, daytime somnolence, bleeding, or neurologic sequela. The patient is tolerating medications without difficulties and is otherwise without complaint today.   Atrial Fibrillation Risk Factors:  she does not have symptoms or diagnosis of sleep apnea. she does not have a history of rheumatic fever. she does not have a history of alcohol use. The patient does not have a history of early familial atrial fibrillation or other arrhythmias.  she has a BMI of Body mass index is 39.84 kg/m.Marland Kitchen Filed Weights   09/11/19 0855  Weight: 92.5 kg    Family History  Problem Relation Age of Onset  . Diabetes Mother   . Hyperlipidemia Mother   . Stroke Mother 72  . Liver cancer Father   . Hypertension Sister   . Hyperlipidemia Sister   . Diabetes Sister        On insulin  . Depression Sister   . Kidney disease Brother        After  dialysis he had renal transplant  . Diabetes Brother   . Coronary artery disease Brother   . Peripheral Artery Disease Brother   . Stroke Brother 66  . Bladder Cancer Brother   . Diabetes Brother      Atrial Fibrillation Management history:  Previous antiarrhythmic drugs: none Previous cardioversions: none Previous ablations: none CHADS2VASC score: 6 Anticoagulation history: Eliquis   Past Medical History:  Diagnosis Date  . Actinic keratosis    Followed by Dr. Allyson Sabal  . Anxiety   . Diabetes mellitus, type II, insulin dependent ([place])    Associated with obesity: She takes Lantus, Victoza & Actos  . Essential hypertension   . GERD (gastroesophageal reflux disease)   . Hyperlipidemia   . Morbid obesity with BMI of 40.0-44.9, adult [facility])    Past Surgical History:  Procedure Laterality Date  . BREAST BIOPSY  1997  . LEFT HEART CATH AND CORONARY ANGIOGRAPHY N/A 01/05/2017   Procedure: LEFT HEART CATH AND CORONARY ANGIOGRAPHY;  Surgeon: Leonie Man, MD;  Location: [facility];  Service: Cardiovascular: Angiographically normal coronary arteries with a right dominant system.  Normal EF 55-65%.  Marland Kitchen LOOP RECORDER INSERTION N/A 08/01/2019   Procedure: LOOP RECORDER INSERTION;  Surgeon: Constance Haw, MD;  Location: [facility];  Service: Cardiovascular;  Laterality: N/A;    Current Outpatient Medications  Medication Sig Dispense Refill  . apixaban (ELIQUIS) 5 MG TABS tablet Take 1 tablet (5 mg total) by mouth  2 (two) times daily. 60 tablet 3  . atorvastatin (LIPITOR) 40 MG tablet Take 1 tablet (40 mg total) by mouth daily. 30 tablet 2  . blood glucose meter kit and supplies KIT Dispense based on patient and insurance preference. Use up to four times daily as directed. (FOR ICD-9 250.00, 250.01). 1 each 0  . carvedilol (COREG) 6.25 MG tablet Take 1 tablet (6.25 mg total) by mouth 2 (two) times daily. 180 tablet 3  . Insulin NPH, Human,, Isophane, (NOVOLIN N  FLEXPEN RELION) 100 UNIT/ML Kiwkpen Inject 10 Units into the skin 2 (two) times daily at 8 am and 10 pm. (Patient taking differently: Inject 15 Units into the skin 2 (two) times daily at 8 am and 10 pm. 15 units at morning and 17 units at night.) 15 mL 11  . Insulin Syringe-Needle U-100 (RELION INSULIN SYR .3CC/29G) 29G X 1/2" 0.3 ML MISC 1 each by Does not apply route in the morning and at bedtime. 100 each 0  . Lancets (ONETOUCH DELICA PLUS [id number]) MISC USE 1 LANCET TO CHECK GLUCOSE UP TO 4 TIMES DAILY    . lisinopril-hydrochlorothiazide (PRINZIDE,ZESTORETIC) 20-12.5 MG tablet Take 1 tablet by mouth daily.    . Naproxen Sod-Diphenhydramine (ALEVE PM) 220-25 MG TABS Take 2 tablets by mouth at bedtime as needed (sleep).    . nitroGLYCERIN (NITROSTAT) 0.4 MG SL tablet Place 1 tablet (0.4 mg total) under the tongue every 5 (five) minutes as needed for chest pain. 25 tablet 5  . ONETOUCH VERIO test strip USE 1 STRIP TO CHECK GLUCOSE UP TO 4 TIMES DAILY AS DIRECTED    . pioglitazone (ACTOS) 30 MG tablet Take 30 mg by mouth every evening.     Marland Kitchen RELION PEN NEEDLES 31G X 6 MM MISC USE 1 IN THE MORNING AND AT BEDTIME     No current facility-administered medications for this encounter.    Allergies  Allergen Reactions  . Livalo [Pitavastatin]     MYALGIAS   . Sulfa Antibiotics Itching    Itching and red spots.    Social History   Socioeconomic History  . Marital status: Widowed    Spouse name: Not on file  . Number of children: 3  . Years of education: 12  . Highest education level: Not on file  Occupational History    Comment: Retired  Tobacco Use  . Smoking status: Never Smoker  . Smokeless tobacco: Never Used  Substance and Sexual Activity  . Alcohol use: No  . Drug use: No  . Sexual activity: Not Currently  Other Topics Concern  . Not on file  Social History Narrative   She is a widowed mother of 3 with 5 grandchildren. She lives with one of her daughters and grandson. She is  a coming by her sister. She tries to walk, but does not do it regularly.   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   Social Determinants of Health   Financial Resource Strain:   . Difficulty of Paying Living Expenses:   Food Insecurity:   . Worried About Charity fundraiser in the Last Year:   . Arboriculturist in the Last Year:   Transportation Needs:   . Film/video editor (Medical):   Marland Kitchen Lack of Transportation (Non-Medical):   Physical Activity:   . Days of Exercise per Week:   . Minutes of Exercise per Session:   Stress:   . Feeling of Stress :  Social Connections:   . Frequency of Communication with Friends and Family:   . Frequency of Social Gatherings with Friends and Family:   . Attends Religious Services:   . Active Member of Clubs or Organizations:   . Attends Archivist Meetings:   Marland Kitchen Marital Status:   Intimate Partner Violence:   . Fear of Current or Ex-Partner:   . Emotionally Abused:   Marland Kitchen Physically Abused:   . Sexually Abused:      ROS- All systems are reviewed and negative except as per the HPI above.  Physical Exam: Vitals:   09/11/19 0855  BP: 128/62  Pulse: 64  Weight: 92.5 kg  Height: 5' (1.524 m)    GEN- The patient is well appearing obese elderly female, alert and oriented x 3 today.   HEENT-head normocephalic, atraumatic, sclera clear, conjunctiva pink, hearing intact, trachea midline. Lungs- Clear to ausculation bilaterally, normal work of breathing Heart- Regular rate and rhythm, no murmurs, rubs or gallops  GI- soft, NT, ND, + BS Extremities- no clubbing, cyanosis, or edema MS- no significant deformity or atrophy Skin- no rash or lesion Psych- euthymic mood, full affect Neuro- sensation is intact   Wt Readings from Last 3 Encounters:  09/11/19 92.5 kg  09/04/19 92.1 kg  08/08/19 88.9 kg    EKG today demonstrates SR HR 64, PR 168, QRS 84, QTc 402  Echo 08/01/19 demonstrated  1. Left ventricular ejection  fraction, by estimation, is 60 to 65%. The  left ventricle has normal function. The left ventricle has no regional  wall motion abnormalities. Left ventricular diastolic parameters are  consistent with Grade I diastolic  dysfunction (impaired relaxation). Elevated left atrial pressure.  2. Right ventricular systolic function is normal. The right ventricular  size is normal. There is normal pulmonary artery systolic pressure. The  estimated right ventricular systolic pressure is 82.9 mmHg.  3. The mitral valve is normal in structure. Trivial mitral valve  regurgitation. No evidence of mitral stenosis.  4. Tricuspid valve regurgitation is moderate.  5. The aortic valve is normal in structure. Aortic valve regurgitation is  not visualized. No aortic stenosis is present.  6. The inferior vena cava is normal in size with greater than 50%  respiratory variability, suggesting right atrial pressure of 3 mmHg.   Conclusion(s)/Recommendation(s): No intracardiac source of embolism  detected on this transthoracic study. A transesophageal echocardiogram is  recommended to exclude cardiac source of embolism if clinically indicated.   Epic records are reviewed at length today  CHA2DS2-VASc Score = 6  The patient's score is based upon: CHF History: 0 HTN History: 1 Age : 1 Diabetes History: 1 Stroke History: 2 Vascular Disease History: 0 Gender: 1      ASSESSMENT AND PLAN: 1. Paroxysmal Atrial Fibrillation (ICD10:  I48.0) The patient's CHA2DS2-VASc score is 6, indicating a 9.7% annual risk of stroke.   Patient appears to be maintaining SR. Continue Eliquis 5 mg BID Check bmet/CBC today. Continue Coreg 6.25 mg BID  2. Secondary Hypercoagulable State (ICD10:  D68.69) The patient is at significant risk for stroke/thromboembolism based upon her CHA2DS2-VASc Score of 6.  Start Apixaban (Eliquis).   3. Obesity Body mass index is 39.84 kg/m. Lifestyle modification was discussed and  encouraged including regular physical activity and weight reduction.  4. HTN Stable, no changes today.   Follow up in the AF clinic in 3 months. [facility].    [facility] [place],  [place] [address] [phone] 09/11/2019 9:33 AM

## 2019-09-10 NOTE — Progress Notes (Signed)
I agree with the above plan 

## 2019-09-11 ENCOUNTER — Other Ambulatory Visit: Payer: Self-pay

## 2019-09-11 ENCOUNTER — Encounter (HOSPITAL_COMMUNITY): Payer: Self-pay | Admitting: Physician Assistant

## 2019-09-11 ENCOUNTER — Ambulatory Visit (HOSPITAL_COMMUNITY)
Admission: RE | Admit: 2019-09-11 | Discharge: 2019-09-11 | Disposition: A | Discharge status: Home / Self Care | Payer: Medicare Other | Source: Ambulatory Visit | Attending: Physician Assistant | Admitting: Physician Assistant

## 2019-09-11 ENCOUNTER — Ambulatory Visit: Payer: Medicare Other

## 2019-09-11 VITALS — BP 128/62 | HR 64 | Ht 60.0 in | Wt 204.0 lb

## 2019-09-11 VITALS — BP 142/80 | HR 66

## 2019-09-11 DIAGNOSIS — Z6839 Body mass index (BMI) 39.0-39.9, adult: Secondary | ICD-10-CM | POA: Insufficient documentation

## 2019-09-11 DIAGNOSIS — M6281 Muscle weakness (generalized): Secondary | ICD-10-CM

## 2019-09-11 DIAGNOSIS — E669 Obesity, unspecified: Secondary | ICD-10-CM | POA: Insufficient documentation

## 2019-09-11 DIAGNOSIS — E785 Hyperlipidemia, unspecified: Secondary | ICD-10-CM | POA: Diagnosis not present

## 2019-09-11 DIAGNOSIS — Z794 Long term (current) use of insulin: Secondary | ICD-10-CM | POA: Diagnosis not present

## 2019-09-11 DIAGNOSIS — I10 Essential (primary) hypertension: Secondary | ICD-10-CM | POA: Insufficient documentation

## 2019-09-11 DIAGNOSIS — R2689 Other abnormalities of gait and mobility: Secondary | ICD-10-CM

## 2019-09-11 DIAGNOSIS — Z8673 Personal history of transient ischemic attack (TIA), and cerebral infarction without residual deficits: Secondary | ICD-10-CM | POA: Insufficient documentation

## 2019-09-11 DIAGNOSIS — E118 Type 2 diabetes mellitus with unspecified complications: Secondary | ICD-10-CM | POA: Diagnosis not present

## 2019-09-11 DIAGNOSIS — Z7901 Long term (current) use of anticoagulants: Secondary | ICD-10-CM | POA: Insufficient documentation

## 2019-09-11 DIAGNOSIS — Z79899 Other long term (current) drug therapy: Secondary | ICD-10-CM | POA: Insufficient documentation

## 2019-09-11 DIAGNOSIS — R2681 Unsteadiness on feet: Secondary | ICD-10-CM

## 2019-09-11 DIAGNOSIS — I480 Paroxysmal atrial fibrillation: Secondary | ICD-10-CM | POA: Insufficient documentation

## 2019-09-11 DIAGNOSIS — D6869 Other thrombophilia: Secondary | ICD-10-CM | POA: Insufficient documentation

## 2019-09-11 LAB — BASIC METABOLIC PANEL
Anion gap: 8 (ref 5–15)
BUN: 16 mg/dL (ref 8–23)
CO2: 28 mmol/L (ref 22–32)
Calcium: 9.2 mg/dL (ref 8.9–10.3)
Chloride: 103 mmol/L (ref 98–111)
Creatinine, Ser: 0.64 mg/dL (ref 0.44–1.00)
GFR calc Af Amer: >60 mL/min (ref >60)
GFR calc non Af Amer: >60 mL/min (ref >60)
Glucose, Bld: 242 mg/dL — ABNORMAL HIGH (ref 70–99)
Potassium: 4.6 mmol/L (ref 3.5–5.1)
Sodium: 139 mmol/L (ref 135–145)

## 2019-09-11 LAB — CBC
HCT: 44.2 % (ref 36.0–46.0)
Hemoglobin: 14.3 g/dL (ref 12.0–15.0)
MCH: 31.7 pg (ref 26.0–34.0)
MCHC: 32.4 g/dL (ref 30.0–36.0)
MCV: 98 fL (ref 80.0–100.0)
Platelets: 219 10*3/uL (ref 150–400)
RBC: 4.51 MIL/uL (ref 3.87–5.11)
RDW: 12.3 % (ref 11.5–15.5)
WBC: 9.1 10*3/uL (ref 4.0–10.5)
nRBC: 0 % (ref 0.0–0.2)

## 2019-09-11 NOTE — Therapy (Signed)
[facility] [address] [place], [place], [address] Phone: [phone]   Fax:  [phone]  Physical Therapy Treatment  Patient Details  Name: [name] MRN: [id number] Date of Birth: 1946-01-25 Referring Provider (PT): Frann Rider will be following   Encounter Date: 09/11/2019   PT End of Session - 09/11/19 1112    Visit Number 8    Number of Visits 17    Date for PT Re-Evaluation 18/29/93   90 day cert, 60 day poc   Authorization Type [employer] medicare so 10th visit progress note, FOTO    PT Start Time 1100    PT Stop Time 1145    PT Time Calculation (min) 45 min    Equipment Utilized During Treatment Gait belt    Activity Tolerance Patient tolerated treatment well    Behavior During Therapy WFL for tasks assessed/performed           Past Medical History:  Diagnosis Date  . Actinic keratosis    Followed by Dr. Allyson Sabal  . Anxiety   . Diabetes mellitus, type II, insulin dependent ([place])    Associated with obesity: She takes Lantus, Victoza & Actos  . Essential hypertension   . GERD (gastroesophageal reflux disease)   . Hyperlipidemia   . Morbid obesity with BMI of 40.0-44.9, adult [facility])     Past Surgical History:  Procedure Laterality Date  . BREAST BIOPSY  1997  . LEFT HEART CATH AND CORONARY ANGIOGRAPHY N/A 01/05/2017   Procedure: LEFT HEART CATH AND CORONARY ANGIOGRAPHY;  Surgeon: Leonie Man, MD;  Location: [facility];  Service: Cardiovascular: Angiographically normal coronary arteries with a right dominant system.  Normal EF 55-65%.  Marland Kitchen LOOP RECORDER INSERTION N/A 08/01/2019   Procedure: LOOP RECORDER INSERTION;  Surgeon: Constance Haw, MD;  Location: [facility];  Service: Cardiovascular;  Laterality: N/A;    Vitals:   09/11/19 1107  BP: (!) 142/80  Pulse: 66  SpO2: 98%     Subjective Assessment - 09/11/19 1103    Subjective Pt reports that she was feeling  unsteady this weekend and had to use the walker both days. This morning she was feeling better but starting to feel a little less steady as day goes on. Pt reports that MD visit got pushed back to Monday with PCP. She did have blood work drawn by [employer] doctor today. She reports that the 2 bouts of a-fib on loop recorder were from May and not recent.    Patient is accompained by: Family member   son, Kit   Pertinent History PMH: type 2 diabetes, uncontrolled with hyperglycemia currently not taking any treatment, hypertension, not taking any medication since at least Christmas time, morbid obesity, A-fib    Patient Stated Goals Pt wants to get better so she can be independent.    Currently in Pain? No/denies                             [facility] Adult PT Treatment/Exercise - 09/11/19 1112      Ambulation/Gait   Ambulation/Gait Yes    Ambulation/Gait Assistance 5: Supervision;4: Min guard    Ambulation/Gait Assistance Details Pt was cued to try to relax arms to get more arm swing.     Ambulation Distance (Feet) 460 Feet    Assistive device None    Gait Pattern Step-through pattern;Decreased arm swing - right;Decreased arm swing - left  Ambulation Surface Level;Indoor      Neuro Re-ed    Neuro Re-ed Details  Dynamic gait activity looking for numbers on wall 200' having to find numbers in orders working on scanning enviornment, then gait with alternating arm raises on command 47' CGA with 1 stagger to right, gait in hallway 75' x 2 with reaching across body for 500gr ball and handing to therapist on the other side then switching for 2nd bout., marching gait 51' with CGA. Static standing at counter: on pillows feet apart eyes open x 30 sec then head turns left/right x 10, attempted eyes closed but increased sway needing min assist to get balance after about 5 sec. Standing on floor eyes closed x 30 sec then staggered stance x 30 sec each position. Increased lean to right with right foot  posterior.                  PT Education - 09/11/19 1252    Education Details Pt to continue with current HEP    Person(s) Educated Patient;Child(ren)    Methods Explanation    Comprehension Verbalized understanding            PT Short Term Goals - 08/16/19 1927      PT SHORT TERM GOAL #1   Title Pt will be able to perform initial HEP with supervision of family for balance.    Time 4    Period Weeks    Status New    Target Date 09/15/19      PT SHORT TERM GOAL #2   Title Pt will ambulate >300' on level surfaces with LRAD versus no device mod I for improved household mobility.    Time 4    Period Weeks    Status New    Target Date 09/15/19      PT SHORT TERM GOAL #3   Title Pt will increase FGA from 7 to >12/30 for improved balance and gait safety.    Baseline 7/30 on 08/16/19    Time 4    Period Weeks    Status New    Target Date 09/15/19             PT Long Term Goals - 08/16/19 1929      PT LONG TERM GOAL #1   Title Pt will be independent with progressive balance program for continued gains on own.    Time 8    Period Weeks    Status New    Target Date 10/15/19      PT LONG TERM GOAL #2   Title Pt will ambulate >500' on varied surfaces independently for improved community mobility.    Time 8    Period Weeks    Status New    Target Date 10/15/19      PT LONG TERM GOAL #3   Title Pt will decrease 5 x sit to stand from 20.39 sec to <16 sec from mat with hands for improved balance and functional strength.    Baseline 20.39 sec from mat with hands    Time 8    Period Weeks    Status New    Target Date 10/15/19      PT LONG TERM GOAL #4   Title Pt will increase FGA from 7/30 to >19/30 for improved balance and gait safety.    Baseline 7/30 on 08/16/19    Time 8    Period Weeks    Status New    Target Date 10/15/19  Plan - 09/11/19 1252    Clinical Impression Statement Pt showed improved stability with gait with  scanning activity today. Was also able to add in more dual tasking with gait. Did note she gets less steady with distractions.    Personal Factors and Comorbidities Comorbidity 3+    Comorbidities DM2, HTN, morbid obesity, a-fib    Examination-Activity Limitations Stairs;Transfers;Locomotion Level    Examination-Participation Restrictions Community Activity;Cleaning    Stability/Clinical Decision Making Evolving/Moderate complexity    Rehab Potential Good    PT Frequency 2x / week    PT Duration 8 weeks    PT Treatment/Interventions ADLs/Self Care Home Management;Aquatic Therapy;DME Instruction;Therapeutic activities;Functional mobility training;Stair training;Gait training;Therapeutic exercise;Balance training;Neuromuscular re-education;Vestibular;Patient/family education    PT Next Visit Plan Check STGs next visit. gait training, balance exercise progression. Monitor HR.    Consulted and Agree with Plan of Care Patient;Family member/caregiver    Family Member Consulted son, Kit           Patient will benefit from skilled therapeutic intervention in order to improve the following deficits and impairments:  Abnormal gait, Decreased activity tolerance, Decreased balance, Decreased mobility, Decreased knowledge of use of DME  Visit Diagnosis: Other abnormalities of gait and mobility  Muscle weakness (generalized)  Unsteadiness on feet     Problem List Patient Active Problem List   Diagnosis Date Noted  . Paroxysmal atrial fibrillation ([place]) 08/08/2019  . Secondary hypercoagulable state ([place]) 08/08/2019  . Acute cerebrovascular accident of cerebellum ([place]) 07/31/2019  . GERD (gastroesophageal reflux disease)   . Leukocytosis   . Morbid obesity ([place]) 01/25/2018  . Chest pain with low risk for cardiac etiology 12/31/2016  . Essential hypertension 12/31/2016  . Hyperlipidemia 12/31/2016  . DM (diabetes mellitus), type 2 with complications ([place]) 29/56/2130    Electa Sniff, PT,  DPT, NCS 09/11/2019, 12:54 PM  [facility] [address] [place], [place], [address] Phone: [phone]   Fax:  [phone]  Name: [name] MRN: [id number] Date of Birth: [birth year]

## 2019-09-13 ENCOUNTER — Ambulatory Visit: Payer: Medicare Other

## 2019-09-13 ENCOUNTER — Other Ambulatory Visit: Payer: Self-pay

## 2019-09-13 VITALS — BP 132/82 | HR 70

## 2019-09-13 DIAGNOSIS — R2681 Unsteadiness on feet: Secondary | ICD-10-CM

## 2019-09-13 DIAGNOSIS — M6281 Muscle weakness (generalized): Secondary | ICD-10-CM

## 2019-09-13 DIAGNOSIS — R2689 Other abnormalities of gait and mobility: Secondary | ICD-10-CM

## 2019-09-13 NOTE — Therapy (Signed)
[facility] [address] [place], [place], [address] Phone: [phone]   Fax:  [phone]  Physical Therapy Treatment  Patient Details  Name: [name] MRN: [id number] Date of Birth: Nov 19, 1945 Referring Provider (PT): Frann Rider will be following   Encounter Date: 09/13/2019   PT End of Session - 09/13/19 1703    Visit Number 9    Number of Visits 17    Date for PT Re-Evaluation 97/35/32   90 day cert, 60 day poc   Authorization Type [employer] medicare so 10th visit progress note, FOTO    PT Start Time 1620    PT Stop Time 1700    PT Time Calculation (min) 40 min    Equipment Utilized During Treatment Gait belt    Activity Tolerance Patient tolerated treatment well    Behavior During Therapy WFL for tasks assessed/performed           Past Medical History:  Diagnosis Date  . Actinic keratosis    Followed by Dr. Allyson Sabal  . Anxiety   . Diabetes mellitus, type II, insulin dependent ([place])    Associated with obesity: She takes Lantus, Victoza & Actos  . Essential hypertension   . GERD (gastroesophageal reflux disease)   . Hyperlipidemia   . Morbid obesity with BMI of 40.0-44.9, adult [facility])     Past Surgical History:  Procedure Laterality Date  . BREAST BIOPSY  1997  . LEFT HEART CATH AND CORONARY ANGIOGRAPHY N/A 01/05/2017   Procedure: LEFT HEART CATH AND CORONARY ANGIOGRAPHY;  Surgeon: Leonie Man, MD;  Location: [facility];  Service: Cardiovascular: Angiographically normal coronary arteries with a right dominant system.  Normal EF 55-65%.  Marland Kitchen LOOP RECORDER INSERTION N/A 08/01/2019   Procedure: LOOP RECORDER INSERTION;  Surgeon: Constance Haw, MD;  Location: [facility];  Service: Cardiovascular;  Laterality: N/A;    Vitals:   09/13/19 1622  BP: 132/82  Pulse: 70     Subjective Assessment - 09/13/19 1622    Subjective Pt reports that she is doing very well today.  Yesterday started out well but got less steady as day went on.    Patient is accompained by: Family member   son, Kit   Pertinent History PMH: type 2 diabetes, uncontrolled with hyperglycemia currently not taking any treatment, hypertension, not taking any medication since at least Christmas time, morbid obesity, A-fib    Patient Stated Goals Pt wants to get better so she can be independent.    Currently in Pain? No/denies                             [facility] Adult PT Treatment/Exercise - 09/13/19 1626      Transfers   Transfers Sit to Stand;Stand to Sit    Sit to Stand 7: Independent    Stand to Sit 7: Independent      Ambulation/Gait   Ambulation/Gait Yes    Ambulation/Gait Assistance 5: Supervision    Ambulation/Gait Assistance Details Pt was cued to relax arms and work on increased step length. HR=86 after    Ambulation Distance (Feet) 850 Feet    Assistive device None    Gait Pattern Step-through pattern    Ambulation Surface Level;Outdoor;Paved;Grass      Neuro Re-ed    Neuro Re-ed Details  At counter: side stepping 6' x 6 without UE support, marching forwards then backwards steps without UE support  on counter 6' x 6, tandem gait with light UE support 6' x 6. Alternating toe taps on cone x 10 bilateral with 1 UE support then with standing on pillow x 10 bilateral CGA. Standing on pillow feet together: eyes open x 30 sec without UE support, head turns left/right and up/down x 10 each CGA. Standing with reaching to place clips on pole x 6 as high as she could reach then x 6 just above head. Pt became unsteady with reaching as high as she could with increased shaking noted and leaning to left some. Improved with just above head.                    PT Short Term Goals - 09/13/19 1926      PT SHORT TERM GOAL #1   Title Pt will be able to perform initial HEP with supervision of family for balance.    Baseline pt has been performing initial HEP    Time 4     Period Weeks    Status Achieved    Target Date 09/15/19      PT SHORT TERM GOAL #2   Title Pt will ambulate >300' on level surfaces with LRAD versus no device mod I for improved household mobility.    Baseline supervision with gait as varies day to day    Time 4    Period Weeks    Status Partially Met    Target Date 09/15/19      PT SHORT TERM GOAL #3   Title Pt will increase FGA from 7 to >12/30 for improved balance and gait safety.    Baseline 7/30 on 08/16/19    Time 4    Period Weeks    Status New    Target Date 09/15/19             PT Long Term Goals - 08/16/19 1929      PT LONG TERM GOAL #1   Title Pt will be independent with progressive balance program for continued gains on own.    Time 8    Period Weeks    Status New    Target Date 10/15/19      PT LONG TERM GOAL #2   Title Pt will ambulate >500' on varied surfaces independently for improved community mobility.    Time 8    Period Weeks    Status New    Target Date 10/15/19      PT LONG TERM GOAL #3   Title Pt will decrease 5 x sit to stand from 20.39 sec to <16 sec from mat with hands for improved balance and functional strength.    Baseline 20.39 sec from mat with hands    Time 8    Period Weeks    Status New    Target Date 10/15/19      PT LONG TERM GOAL #4   Title Pt will increase FGA from 7/30 to >19/30 for improved balance and gait safety.    Baseline 7/30 on 08/16/19    Time 8    Period Weeks    Status New    Target Date 10/15/19                 Plan - 09/13/19 1927    Clinical Impression Statement Pt was able to progress gait outdoors today. She showed the most stability with gait since she started with better arm swing today. Gait ability has varied at times though and  patient having to use walker some days at home when feeling less steady. Met initial HEP. Need to check FGA next visit. Pt most challenged with reaching up high with looking up. Will continue to benefit from skilled PT.     Personal Factors and Comorbidities Comorbidity 3+    Comorbidities DM2, HTN, morbid obesity, a-fib    Examination-Activity Limitations Stairs;Transfers;Locomotion Level    Examination-Participation Restrictions Community Activity;Cleaning    Stability/Clinical Decision Making Evolving/Moderate complexity    Rehab Potential Good    PT Frequency 2x / week    PT Duration 8 weeks    PT Treatment/Interventions ADLs/Self Care Home Management;Aquatic Therapy;DME Instruction;Therapeutic activities;Functional mobility training;Stair training;Gait training;Therapeutic exercise;Balance training;Neuromuscular re-education;Vestibular;Patient/family education    PT Next Visit Plan Check FGA STG. 10th visit progress note. Work on reaching up high to each side. gait training, balance exercise progression. Monitor HR.    Consulted and Agree with Plan of Care Patient;Family member/caregiver    Family Member Consulted son, Kit           Patient will benefit from skilled therapeutic intervention in order to improve the following deficits and impairments:  Abnormal gait, Decreased activity tolerance, Decreased balance, Decreased mobility, Decreased knowledge of use of DME  Visit Diagnosis: Other abnormalities of gait and mobility  Muscle weakness (generalized)  Unsteadiness on feet     Problem List Patient Active Problem List   Diagnosis Date Noted  . Paroxysmal atrial fibrillation ([place]) 08/08/2019  . Secondary hypercoagulable state ([place]) 08/08/2019  . Acute cerebrovascular accident of cerebellum ([place]) 07/31/2019  . GERD (gastroesophageal reflux disease)   . Leukocytosis   . Morbid obesity ([place]) 01/25/2018  . Chest pain with low risk for cardiac etiology 12/31/2016  . Essential hypertension 12/31/2016  . Hyperlipidemia 12/31/2016  . DM (diabetes mellitus), type 2 with complications ([place]) 47/53/3917    Electa Sniff, PT, DPT, NCS 09/13/2019, 7:30 PM  [facility] [address] [place], [place], [address] Phone: [phone]   Fax:  [phone]  Name: Alazay Leicht MRN: [id number] Date of Birth: Aug 21, 1945

## 2019-09-18 ENCOUNTER — Other Ambulatory Visit: Payer: Self-pay

## 2019-09-18 ENCOUNTER — Ambulatory Visit: Payer: Medicare Other

## 2019-09-18 DIAGNOSIS — R2689 Other abnormalities of gait and mobility: Secondary | ICD-10-CM

## 2019-09-18 DIAGNOSIS — R2681 Unsteadiness on feet: Secondary | ICD-10-CM

## 2019-09-18 NOTE — Therapy (Signed)
[facility] [address] [place], [place], [address] Phone: [phone]   Fax:  [phone]  Physical Therapy Treatment/ 10th visit progress note  Patient Details  Name: [name] MRN: [id number] Date of Birth: 1945/06/26 Referring Provider (PT): Frann Rider will be following    Progress Note  Reporting period 08/16/19 to 09/18/19  See Note below for Objective Data and Assessment of Progress/Goals   Encounter Date: 09/18/2019   PT End of Session - 09/18/19 1625    Visit Number 10    Number of Visits 17    Date for PT Re-Evaluation 41/63/84   90 day cert, 60 day poc   Authorization Type [employer] medicare so 10th visit progress note, FOTO    PT Start Time 1620    PT Stop Time 1659    PT Time Calculation (min) 39 min    Equipment Utilized During Treatment Gait belt    Activity Tolerance Patient tolerated treatment well    Behavior During Therapy WFL for tasks assessed/performed           Past Medical History:  Diagnosis Date   Actinic keratosis    Followed by Dr. Allyson Sabal   Anxiety    Diabetes mellitus, type II, insulin dependent ([place])    Associated with obesity: She takes Lantus, Victoza & Actos   Essential hypertension    GERD (gastroesophageal reflux disease)    Hyperlipidemia    Morbid obesity with BMI of 40.0-44.9, adult [facility])     Past Surgical History:  Procedure Laterality Date   BREAST BIOPSY  1997   LEFT HEART CATH AND CORONARY ANGIOGRAPHY N/A 01/05/2017   Procedure: LEFT HEART CATH AND CORONARY ANGIOGRAPHY;  Surgeon: Leonie Man, MD;  Location: [facility];  Service: Cardiovascular: Angiographically normal coronary arteries with a right dominant system.  Normal EF 55-65%.   LOOP RECORDER INSERTION N/A 08/01/2019   Procedure: LOOP RECORDER INSERTION;  Surgeon: Constance Haw, MD;  Location: [facility];  Service: Cardiovascular;  Laterality: N/A;    There  were no vitals filed for this visit.   Subjective Assessment - 09/18/19 1622    Subjective Pt reports that this is her 6th good day in a row. Saw PCP yesterday and they did up her insulin to 20 units in morning and night. Blood sugar was 138 this morning.    Patient is accompained by: Family member   son, [name]   Pertinent History PMH: type 2 diabetes, uncontrolled with hyperglycemia currently not taking any treatment, hypertension, not taking any medication since at least Christmas time, morbid obesity, A-fib    Patient Stated Goals Pt wants to get better so she can be independent.    Currently in Pain? No/denies              [facility] PT Assessment - 09/18/19 1627      Functional Gait  Assessment   Gait assessed  Yes    Gait Level Surface Walks 20 ft, slow speed, abnormal gait pattern, evidence for imbalance or deviates 10-15 in outside of the 12 in walkway width. Requires more than 7 sec to ambulate 20 ft.   8 sec   Change in Gait Speed Able to change speed, demonstrates mild gait deviations, deviates 6-10 in outside of the 12 in walkway width, or no gait deviations, unable to achieve a major change in velocity, or uses a change in velocity, or uses an assistive device.    Gait with  Horizontal Head Turns Performs head turns smoothly with slight change in gait velocity (eg, minor disruption to smooth gait path), deviates 6-10 in outside 12 in walkway width, or uses an assistive device.    Gait with Vertical Head Turns Performs task with slight change in gait velocity (eg, minor disruption to smooth gait path), deviates 6 - 10 in outside 12 in walkway width or uses assistive device    Gait and Pivot Turn Pivot turns safely in greater than 3 sec and stops with no loss of balance, or pivot turns safely within 3 sec and stops with mild imbalance, requires small steps to catch balance.    Step Over Obstacle Is able to step over one shoe box (4.5 in total height) but must slow down and adjust steps to  clear box safely. May require verbal cueing.    Gait with Narrow Base of Support Ambulates less than 4 steps heel to toe or cannot perform without assistance.    Gait with Eyes Closed Walks 20 ft, slow speed, abnormal gait pattern, evidence for imbalance, deviates 10-15 in outside 12 in walkway width. Requires more than 9 sec to ambulate 20 ft.    Ambulating Backwards Walks 20 ft, uses assistive device, slower speed, mild gait deviations, deviates 6-10 in outside 12 in walkway width.    Steps Two feet to a stair, must use rail.    Total Score 14                         [facility] Adult PT Treatment/Exercise - 09/18/19 1627      Ambulation/Gait   Ambulation/Gait Yes    Ambulation/Gait Assistance 5: Supervision    Ambulation/Gait Assistance Details Pt ambulated 230' with verbal cues to increase arm swing, 115' with holding ball and moving side to side following with eyes then moving ball up and down and following with eyes 115'.  Denied any dizziness with activities. Slowed down some with adding in ball tracking but no LOB.    Ambulation Distance (Feet) 460 Feet    Assistive device None    Gait Pattern Step-through pattern;Decreased arm swing - right;Decreased arm swing - left    Ambulation Surface Level;Indoor      Neuro Re-ed    Neuro Re-ed Details  Standing with reaching overhead to each side to place 6 clips on pole close SBA. Pt denied any dizziness/ unsteady feeling with reaching and looking up today. Standing on blue foam mat: staggered stance 30 sec each position then adding in head turns in each position x 10, Standing with feet together without UE support x 30 sec then feet apart with eyes closed x 30 sec with slight increased sway. Standing march x 10 with patient hesitating at first but improved as went on then marching gait over blue mat 6' x 6 without UE support. CGA with asll activities for safety.  Increased sway with head turns.                   PT Education  - 09/18/19 2213    Education Details Educated on results of FGA testing.    Person(s) Educated Patient    Methods Explanation    Comprehension Verbalized understanding            PT Short Term Goals - 09/18/19 1635      PT SHORT TERM GOAL #1   Title Pt will be able to perform initial HEP with supervision of family  for balance.    Baseline pt has been performing initial HEP    Time 4    Period Weeks    Status Achieved    Target Date 09/15/19      PT SHORT TERM GOAL #2   Title Pt will ambulate >300' on level surfaces with LRAD versus no device mod I for improved household mobility.    Baseline supervision with gait as varies day to day    Time 4    Period Weeks    Status Partially Met    Target Date 09/15/19      PT SHORT TERM GOAL #3   Title Pt will increase FGA from 7 to >12/30 for improved balance and gait safety.    Baseline 7/30 on 08/16/19, 09/18/19 FGA=14/30    Time 4    Period Weeks    Status Achieved    Target Date 09/15/19             PT Long Term Goals - 08/16/19 1929      PT LONG TERM GOAL #1   Title Pt will be independent with progressive balance program for continued gains on own.    Time 8    Period Weeks    Status New    Target Date 10/15/19      PT LONG TERM GOAL #2   Title Pt will ambulate >500' on varied surfaces independently for improved community mobility.    Time 8    Period Weeks    Status New    Target Date 10/15/19      PT LONG TERM GOAL #3   Title Pt will decrease 5 x sit to stand from 20.39 sec to <16 sec from mat with hands for improved balance and functional strength.    Baseline 20.39 sec from mat with hands    Time 8    Period Weeks    Status New    Target Date 10/15/19      PT LONG TERM GOAL #4   Title Pt will increase FGA from 7/30 to >19/30 for improved balance and gait safety.    Baseline 7/30 on 08/16/19    Time 8    Period Weeks    Status New    Target Date 10/15/19                 Plan - 09/18/19 2215     Clinical Impression Statement PT reassesed FGA today and patient improved signifcantly meeting STG. Still high fall risk based on scoring though. Pt has been having more "good" days with less unsteadiness per her report. She is showing improving stability with gait. PT continues to focus on balance training. Pt will continue to benefit from PT to further progress gait and balance to maximize function.    Personal Factors and Comorbidities Comorbidity 3+    Comorbidities DM2, HTN, morbid obesity, a-fib    Examination-Activity Limitations Stairs;Transfers;Locomotion Level    Examination-Participation Restrictions Community Activity;Cleaning    Stability/Clinical Decision Making Evolving/Moderate complexity    Rehab Potential Good    PT Frequency 2x / week    PT Duration 8 weeks    PT Treatment/Interventions ADLs/Self Care Home Management;Aquatic Therapy;DME Instruction;Therapeutic activities;Functional mobility training;Stair training;Gait training;Therapeutic exercise;Balance training;Neuromuscular re-education;Vestibular;Patient/family education    PT Next Visit Plan Continue gait training on varied surfaces, balance exercise progression. Monitor HR.    Consulted and Agree with Plan of Care Patient;Family member/caregiver    Family Member Consulted son, [name]  Patient will benefit from skilled therapeutic intervention in order to improve the following deficits and impairments:  Abnormal gait, Decreased activity tolerance, Decreased balance, Decreased mobility, Decreased knowledge of use of DME  Visit Diagnosis: Unsteadiness on feet  Other abnormalities of gait and mobility     Problem List Patient Active Problem List   Diagnosis Date Noted   Paroxysmal atrial fibrillation ([place]) 08/08/2019   Secondary hypercoagulable state ([place]) 08/08/2019   Acute cerebrovascular accident of cerebellum ([place]) 07/31/2019   GERD (gastroesophageal reflux disease)    Leukocytosis     Morbid obesity ([place]) 01/25/2018   Chest pain with low risk for cardiac etiology 12/31/2016   Essential hypertension 12/31/2016   Hyperlipidemia 12/31/2016   DM (diabetes mellitus), type 2 with complications ([place]) 68/16/6196    Electa Sniff, PT, DPT, NCS 09/18/2019, 10:19 PM  [facility] [address] [place], [place], [address] Phone: [phone]   Fax:  [phone]  Name: [name] MRN: [id number] Date of Birth: June 03, 1945

## 2019-09-20 ENCOUNTER — Ambulatory Visit: Payer: Medicare Other

## 2019-09-20 ENCOUNTER — Other Ambulatory Visit: Payer: Self-pay

## 2019-09-20 DIAGNOSIS — R2681 Unsteadiness on feet: Secondary | ICD-10-CM

## 2019-09-20 DIAGNOSIS — R2689 Other abnormalities of gait and mobility: Secondary | ICD-10-CM | POA: Diagnosis not present

## 2019-09-20 DIAGNOSIS — M6281 Muscle weakness (generalized): Secondary | ICD-10-CM

## 2019-09-20 NOTE — Therapy (Signed)
[facility] [address] [place], [place], [address] Phone: [phone]   Fax:  [phone]  Physical Therapy Treatment  Patient Details  Name: [name] MRN: [id number] Date of Birth: [birth year] Referring Provider (PT): Frann Rider will be following   Encounter Date: 09/20/2019   PT End of Session - 09/20/19 1620    Visit Number 11    Number of Visits 17    Date for PT Re-Evaluation 56/38/75   90 day cert, 60 day poc   Authorization Type U[employer] medicare so 10th visit progress note, FOTO    PT Start Time 1617    PT Stop Time 1657    PT Time Calculation (min) 40 min    Equipment Utilized During Treatment Gait belt    Activity Tolerance Patient tolerated treatment well    Behavior During Therapy WFL for tasks assessed/performed           Past Medical History:  Diagnosis Date  . Actinic keratosis    Followed by Dr. Allyson Sabal  . Anxiety   . Diabetes mellitus, type II, insulin dependent ([place])    Associated with obesity: She takes Lantus, Victoza & Actos  . Essential hypertension   . GERD (gastroesophageal reflux disease)   . Hyperlipidemia   . Morbid obesity with BMI of 40.0-44.9, adult [facility])     Past Surgical History:  Procedure Laterality Date  . BREAST BIOPSY  1997  . LEFT HEART CATH AND CORONARY ANGIOGRAPHY N/A 01/05/2017   Procedure: LEFT HEART CATH AND CORONARY ANGIOGRAPHY;  Surgeon: Leonie Man, MD;  Location: [facility];  Service: Cardiovascular: Angiographically normal coronary arteries with a right dominant system.  Normal EF 55-65%.  Marland Kitchen LOOP RECORDER INSERTION N/A 08/01/2019   Procedure: LOOP RECORDER INSERTION;  Surgeon: Constance Haw, MD;  Location: [facility];  Service: Cardiovascular;  Laterality: N/A;    There were no vitals filed for this visit.   Subjective Assessment - 09/20/19 1620    Subjective Pt reports she has been doing well. Has not had to use walker.  Does report almost losing balance backwards when looked up across room the other day and had to grab son. Blood sugar was 82 this morning.    Patient is accompained by: Family member   son, Kit   Pertinent History PMH: type 2 diabetes, uncontrolled with hyperglycemia currently not taking any treatment, hypertension, not taking any medication since at least Christmas time, morbid obesity, A-fib    Patient Stated Goals Pt wants to get better so she can be independent.    Currently in Pain? No/denies                             [facility] Adult PT Treatment/Exercise - 09/20/19 1621      Ambulation/Gait   Ambulation/Gait Yes    Ambulation/Gait Assistance 5: Supervision    Ambulation/Gait Assistance Details Pt was cued to relax arms for more arm swing. To be sure to pick up feet more over grass. HR=92 after    Ambulation Distance (Feet) 850 Feet    Assistive device None    Gait Pattern Step-through pattern;Decreased arm swing - right;Decreased arm swing - left    Ambulation Surface Level;Unlevel;Outdoor;Paved;Grass      Neuro Re-ed    Neuro Re-ed Details  Standing without UE support alternating toe taps on red stone x 10 bilateral then alternating tapping cone with 1 HHA  x 10 bilateral. Pt was cued to squeeze bottom on stance leg. CGA for safety. Sit to stand from mat without UE with feet on blue mat 5 x 2 geting balance each time.  Marching in place on blue mat without UE suppot 10 x 2 CGA.  Standing on blue mat with feet apart looking up and down x 10 then reaching for targets overhead and looking at target as well x 30 sec. Standing with feet together on blue mat x 30 sec then with adding in reaching x 30 sec overhead again. Staggered stance on blue mat x 30 sec each position then adding in head turns x 10 side to side CGA as increased sway. Dynamic gait activities in hallway: marching gait 40' x 1, gait with head turns left/right 40' x 2, gait with head turns up/down 40' x 2, side  stepping 40' each direction supervision/CGA as needed with slight increased sway with head turns side to side.                    PT Short Term Goals - 09/18/19 1635      PT SHORT TERM GOAL #1   Title Pt will be able to perform initial HEP with supervision of family for balance.    Baseline pt has been performing initial HEP    Time 4    Period Weeks    Status Achieved    Target Date 09/15/19      PT SHORT TERM GOAL #2   Title Pt will ambulate >300' on level surfaces with LRAD versus no device mod I for improved household mobility.    Baseline supervision with gait as varies day to day    Time 4    Period Weeks    Status Partially Met    Target Date 09/15/19      PT SHORT TERM GOAL #3   Title Pt will increase FGA from 7 to >12/30 for improved balance and gait safety.    Baseline 7/30 on 08/16/19, 09/18/19 FGA=14/30    Time 4    Period Weeks    Status Achieved    Target Date 09/15/19             PT Long Term Goals - 08/16/19 1929      PT LONG TERM GOAL #1   Title Pt will be independent with progressive balance program for continued gains on own.    Time 8    Period Weeks    Status New    Target Date 10/15/19      PT LONG TERM GOAL #2   Title Pt will ambulate >500' on varied surfaces independently for improved community mobility.    Time 8    Period Weeks    Status New    Target Date 10/15/19      PT LONG TERM GOAL #3   Title Pt will decrease 5 x sit to stand from 20.39 sec to <16 sec from mat with hands for improved balance and functional strength.    Baseline 20.39 sec from mat with hands    Time 8    Period Weeks    Status New    Target Date 10/15/19      PT LONG TERM GOAL #4   Title Pt will increase FGA from 7/30 to >19/30 for improved balance and gait safety.    Baseline 7/30 on 08/16/19    Time 8    Period Weeks    Status New  Target Date 10/15/19                 Plan - 09/20/19 2010    Clinical Impression Statement Session  focused on continuing to work on gait on varied surfaces and balance activities. Pt continues to show improving stability.    Personal Factors and Comorbidities Comorbidity 3+    Comorbidities DM2, HTN, morbid obesity, a-fib    Examination-Activity Limitations Stairs;Transfers;Locomotion Level    Examination-Participation Restrictions Community Activity;Cleaning    Stability/Clinical Decision Making Evolving/Moderate complexity    Rehab Potential Good    PT Frequency 2x / week    PT Duration 8 weeks    PT Treatment/Interventions ADLs/Self Care Home Management;Aquatic Therapy;DME Instruction;Therapeutic activities;Functional mobility training;Stair training;Gait training;Therapeutic exercise;Balance training;Neuromuscular re-education;Vestibular;Patient/family education    PT Next Visit Plan Continue gait training on varied surfaces, balance exercise progression. Monitor HR.    Consulted and Agree with Plan of Care Patient;Family member/caregiver    Family Member Consulted son, Kit           Patient will benefit from skilled therapeutic intervention in order to improve the following deficits and impairments:  Abnormal gait, Decreased activity tolerance, Decreased balance, Decreased mobility, Decreased knowledge of use of DME  Visit Diagnosis: Other abnormalities of gait and mobility  Muscle weakness (generalized)  Unsteadiness on feet     Problem List Patient Active Problem List   Diagnosis Date Noted  . Paroxysmal atrial fibrillation ([place]) 08/08/2019  . Secondary hypercoagulable state ([place]) 08/08/2019  . Acute cerebrovascular accident of cerebellum ([place]) 07/31/2019  . GERD (gastroesophageal reflux disease)   . Leukocytosis   . Morbid obesity ([place]) 01/25/2018  . Chest pain with low risk for cardiac etiology 12/31/2016  . Essential hypertension 12/31/2016  . Hyperlipidemia 12/31/2016  . DM (diabetes mellitus), type 2 with complications ([place]) 03/13/2445    Electa Sniff,  PT, DPT, NCS 09/20/2019, 8:11 PM  [facility] [address] [place], [place], [address] Phone: [phone]   Fax:  [phone]  Name: [name] MRN: [id number] Date of Birth: [birth year]

## 2019-09-25 ENCOUNTER — Other Ambulatory Visit: Payer: Self-pay

## 2019-09-25 ENCOUNTER — Ambulatory Visit: Payer: Medicare Other

## 2019-09-25 DIAGNOSIS — M6281 Muscle weakness (generalized): Secondary | ICD-10-CM

## 2019-09-25 DIAGNOSIS — R2689 Other abnormalities of gait and mobility: Secondary | ICD-10-CM

## 2019-09-25 DIAGNOSIS — R2681 Unsteadiness on feet: Secondary | ICD-10-CM

## 2019-09-25 NOTE — Therapy (Signed)
[facility] [address] [place], [place], [address] Phone: [phone]   Fax:  [phone]  Physical Therapy Treatment  Patient Details  Name: [name] MRN: [id number] Date of Birth: October 07, 1945 Referring Provider (PT): Frann Rider will be following   Encounter Date: 09/25/2019   PT End of Session - 09/25/19 1620    Visit Number 12    Number of Visits 17    Date for PT Re-Evaluation 62/44/69   90 day cert, 60 day poc   Authorization Type UHC medicare so 10th visit progress note, FOTO    PT Start Time 1618    PT Stop Time 1700    PT Time Calculation (min) 42 min    Equipment Utilized During Treatment Gait belt    Activity Tolerance Patient tolerated treatment well    Behavior During Therapy WFL for tasks assessed/performed           Past Medical History:  Diagnosis Date  . Actinic keratosis    Followed by Dr. Allyson Sabal  . Anxiety   . Diabetes mellitus, type II, insulin dependent ([place])    Associated with obesity: She takes Lantus, Victoza & Actos  . Essential hypertension   . GERD (gastroesophageal reflux disease)   . Hyperlipidemia   . Morbid obesity with BMI of 40.0-44.9, adult [facility])     Past Surgical History:  Procedure Laterality Date  . BREAST BIOPSY  1997  . LEFT HEART CATH AND CORONARY ANGIOGRAPHY N/A 01/05/2017   Procedure: LEFT HEART CATH AND CORONARY ANGIOGRAPHY;  Surgeon: Leonie Man, MD;  Location: [facility];  Service: Cardiovascular: Angiographically normal coronary arteries with a right dominant system.  Normal EF 55-65%.  Marland Kitchen LOOP RECORDER INSERTION N/A 08/01/2019   Procedure: LOOP RECORDER INSERTION;  Surgeon: Constance Haw, MD;  Location: [facility];  Service: Cardiovascular;  Laterality: N/A;    There were no vitals filed for this visit.   Subjective Assessment - 09/25/19 1621    Subjective Pt reports that she has still not had to use the walker since  before last session. [place] She went up and down stairs there and at home holding to railing well. Pt reports that she did almost fall back on Tuesday though as she was looking up for recipe and had to grab her son to prevent fall.    Patient is accompained by: Family member   son, Kit   Pertinent History PMH: type 2 diabetes, uncontrolled with hyperglycemia currently not taking any treatment, hypertension, not taking any medication since at least Christmas time, morbid obesity, A-fib    Patient Stated Goals Pt wants to get better so she can be independent.    Currently in Pain? No/denies                             [facility] Adult PT Treatment/Exercise - 09/25/19 1622      Ambulation/Gait   Ambulation/Gait Yes    Ambulation/Gait Assistance 5: Supervision    Ambulation/Gait Assistance Details Pt was cued to try to relax arms to get more arm swing. HR=84 after gait with pt reporting 8/10 RPE.     Ambulation Distance (Feet) 805 Feet    Assistive device None    Gait Pattern Step-through pattern;Decreased arm swing - left;Decreased arm swing - right    Ambulation Surface Level;Indoor      Neuro Re-ed  Neuro Re-ed Details  Standing in front of mat: feet together without UE support looking up and down x 10, reaching 1 arm overhead following with eyes x 5 each side. Feet apart diagonals moving ball from one hip to opposite shoulder overhead x 5 each side tracking with eyes. Stepping strategy at counter: posterior steps x 10 each leg then to each side x 10. Sit to stand from mat with blue mat under feet x 10 without UE support getting balance each time. Standing on blue mat: marching in place x 10 bilateral CGA, staggered stance x 30 sec each position.                  PT Education - 09/25/19 1659    Education Details Pt instructed to add foam pad under her with the following standing in corner exercises: feet together, staggered stance and head turns. To  have supervision when performs and walker in front.    Person(s) Educated Patient;Child(ren)    Methods Explanation;Demonstration    Comprehension Verbalized understanding            PT Short Term Goals - 09/18/19 1635      PT SHORT TERM GOAL #1   Title Pt will be able to perform initial HEP with supervision of family for balance.    Baseline pt has been performing initial HEP    Time 4    Period Weeks    Status Achieved    Target Date 09/15/19      PT SHORT TERM GOAL #2   Title Pt will ambulate >300' on level surfaces with LRAD versus no device mod I for improved household mobility.    Baseline supervision with gait as varies day to day    Time 4    Period Weeks    Status Partially Met    Target Date 09/15/19      PT SHORT TERM GOAL #3   Title Pt will increase FGA from 7 to >12/30 for improved balance and gait safety.    Baseline 7/30 on 08/16/19, 09/18/19 FGA=14/30    Time 4    Period Weeks    Status Achieved    Target Date 09/15/19             PT Long Term Goals - 08/16/19 1929      PT LONG TERM GOAL #1   Title Pt will be independent with progressive balance program for continued gains on own.    Time 8    Period Weeks    Status New    Target Date 10/15/19      PT LONG TERM GOAL #2   Title Pt will ambulate >500' on varied surfaces independently for improved community mobility.    Time 8    Period Weeks    Status New    Target Date 10/15/19      PT LONG TERM GOAL #3   Title Pt will decrease 5 x sit to stand from 20.39 sec to <16 sec from mat with hands for improved balance and functional strength.    Baseline 20.39 sec from mat with hands    Time 8    Period Weeks    Status New    Target Date 10/15/19      PT LONG TERM GOAL #4   Title Pt will increase FGA from 7/30 to >19/30 for improved balance and gait safety.    Baseline 7/30 on 08/16/19    Time 8    Period  Weeks    Status New    Target Date 10/15/19                 Plan - 09/25/19  1837    Clinical Impression Statement Pt did not have any LOB with looking up activities today. 1 slight LOB posterior when was just standing in front of mat. Continues to show progress with gait stability and distance with improving activity tolerance.    Personal Factors and Comorbidities Comorbidity 3+    Comorbidities DM2, HTN, morbid obesity, a-fib    Examination-Activity Limitations Stairs;Transfers;Locomotion Level    Examination-Participation Restrictions Community Activity;Cleaning    Stability/Clinical Decision Making Evolving/Moderate complexity    Rehab Potential Good    PT Frequency 2x / week    PT Duration 8 weeks    PT Treatment/Interventions ADLs/Self Care Home Management;Aquatic Therapy;DME Instruction;Therapeutic activities;Functional mobility training;Stair training;Gait training;Therapeutic exercise;Balance training;Neuromuscular re-education;Vestibular;Patient/family education    PT Next Visit Plan Do we need to add more visits? Continue gait training on varied surfaces, balance exercise progression. Monitor HR.    Consulted and Agree with Plan of Care Patient;Family member/caregiver    Family Member Consulted son, Kit           Patient will benefit from skilled therapeutic intervention in order to improve the following deficits and impairments:  Abnormal gait, Decreased activity tolerance, Decreased balance, Decreased mobility, Decreased knowledge of use of DME  Visit Diagnosis: Other abnormalities of gait and mobility  Muscle weakness (generalized)  Unsteadiness on feet     Problem List Patient Active Problem List   Diagnosis Date Noted  . Paroxysmal atrial fibrillation ([place]) 08/08/2019  . Secondary hypercoagulable state ([place]) 08/08/2019  . Acute cerebrovascular accident of cerebellum ([place]) 07/31/2019  . GERD (gastroesophageal reflux disease)   . Leukocytosis   . Morbid obesity ([place]) 01/25/2018  . Chest pain with low risk for cardiac etiology 12/31/2016    . Essential hypertension 12/31/2016  . Hyperlipidemia 12/31/2016  . DM (diabetes mellitus), type 2 with complications ([place]) 38/12/1749    Electa Sniff, PT, DPT, NCS 09/25/2019, 6:39 PM  [facility] [address] [place], [place], [address] Phone: [phone]   Fax:  [phone]  Name: [name] MRN: [id number] Date of Birth: 27-Mar-1946

## 2019-09-27 ENCOUNTER — Other Ambulatory Visit: Payer: Self-pay

## 2019-09-27 ENCOUNTER — Ambulatory Visit: Payer: Medicare Other | Attending: Internal Medicine

## 2019-09-27 DIAGNOSIS — R2689 Other abnormalities of gait and mobility: Secondary | ICD-10-CM | POA: Diagnosis not present

## 2019-09-27 DIAGNOSIS — R2681 Unsteadiness on feet: Secondary | ICD-10-CM | POA: Diagnosis present

## 2019-09-27 DIAGNOSIS — M6281 Muscle weakness (generalized): Secondary | ICD-10-CM | POA: Diagnosis present

## 2019-09-28 NOTE — Therapy (Signed)
[facility] [address] [place], [place], [address] Phone: [phone]   Fax:  [phone]  Physical Therapy Treatment  Patient Details  Name: [name] MRN: [id number] Date of Birth: January 05, 1946 Referring Provider (PT): Frann Rider will be following   Encounter Date: 09/27/2019   PT End of Session - 09/27/19 1623    Visit Number 13    Number of Visits 17    Date for PT Re-Evaluation 03/54/65   90 day cert, 60 day poc   Authorization Type UHC medicare so 10th visit progress note, FOTO    PT Start Time 1620    PT Stop Time 1701    PT Time Calculation (min) 41 min    Equipment Utilized During Treatment Gait belt    Activity Tolerance Patient tolerated treatment well    Behavior During Therapy WFL for tasks assessed/performed           Past Medical History:  Diagnosis Date  . Actinic keratosis    Followed by Dr. Allyson Sabal  . Anxiety   . Diabetes mellitus, type II, insulin dependent ([place])    Associated with obesity: She takes Lantus, Victoza & Actos  . Essential hypertension   . GERD (gastroesophageal reflux disease)   . Hyperlipidemia   . Morbid obesity with BMI of 40.0-44.9, adult [facility])     Past Surgical History:  Procedure Laterality Date  . BREAST BIOPSY  1997  . LEFT HEART CATH AND CORONARY ANGIOGRAPHY N/A 01/05/2017   Procedure: LEFT HEART CATH AND CORONARY ANGIOGRAPHY;  Surgeon: Leonie Man, MD;  Location: [facility];  Service: Cardiovascular: Angiographically normal coronary arteries with a right dominant system.  Normal EF 55-65%.  Marland Kitchen LOOP RECORDER INSERTION N/A 08/01/2019   Procedure: LOOP RECORDER INSERTION;  Surgeon: Constance Haw, MD;  Location: [facility];  Service: Cardiovascular;  Laterality: N/A;    There were no vitals filed for this visit.   Subjective Assessment - 09/27/19 1624    Subjective Pt reports that she let her sitter go yesterday as she feels  like she is doing better and did not need anymore. Sugar was 82 this morning and felt fine. Had salad for lunch with some berries in it and sugar was 286 after prior to coming.    Patient is accompained by: Family member   son, [name]   Pertinent History PMH: type 2 diabetes, uncontrolled with hyperglycemia currently not taking any treatment, hypertension, not taking any medication since at least Christmas time, morbid obesity, A-fib    Patient Stated Goals Pt wants to get better so she can be independent.    Currently in Pain? No/denies                             [facility] Adult PT Treatment/Exercise - 09/27/19 1625      Ambulation/Gait   Ambulation/Gait Yes    Ambulation/Gait Assistance 5: Supervision    Ambulation/Gait Assistance Details Verbal cues to relax arms to get more arm swing    Ambulation Distance (Feet) 300 Feet    Assistive device None    Gait Pattern Step-through pattern;Decreased arm swing - right;Decreased arm swing - left    Ambulation Surface Level;Indoor    Ramp 5: Supervision    Ramp Details (indicate cue type and reason) x 2    Curb 5: Supervision    Curb Details (indicate cue type and reason) Performed x 3  after practice on varied step heights. Pt cautious at first to step but did well once she did.      Neuro Re-ed    Neuro Re-ed Details  Gait over obstacle course: stepping up on red mat then tapping stone with each foot and stepping over 3 separate stones then weaving in and out of 4 cones. Pt tentative to step up on mat at first but improved as went on. CGA for safety. Performed 6 laps. Stepping up on 2" red mat x 10. Step-ups on 4" step x 10 each foot CGA then 6" step x 5 with right leg then stepping up and then off forward over step x 6 CGA/ supervisoin as went on . Alternating toe taps on cone x 10 bilateral CGA. Standing on ramp facing uphill: no hands x 30 sec then added in head turns left/right and up/down x 10 each CGA. One episode of unsteadiness  looking up but only min assist to correct. Sit to stand x 5 from edge of mat with airex under feet. Unsteady at first but improved as went on. Then standing on airex with reaching for targets x 1 min standing on airex.                    PT Short Term Goals - 09/18/19 1635      PT SHORT TERM GOAL #1   Title Pt will be able to perform initial HEP with supervision of family for balance.    Baseline pt has been performing initial HEP    Time 4    Period Weeks    Status Achieved    Target Date 09/15/19      PT SHORT TERM GOAL #2   Title Pt will ambulate >300' on level surfaces with LRAD versus no device mod I for improved household mobility.    Baseline supervision with gait as varies day to day    Time 4    Period Weeks    Status Partially Met    Target Date 09/15/19      PT SHORT TERM GOAL #3   Title Pt will increase FGA from 7 to >12/30 for improved balance and gait safety.    Baseline 7/30 on 08/16/19, 09/18/19 FGA=14/30    Time 4    Period Weeks    Status Achieved    Target Date 09/15/19             PT Long Term Goals - 08/16/19 1929      PT LONG TERM GOAL #1   Title Pt will be independent with progressive balance program for continued gains on own.    Time 8    Period Weeks    Status New    Target Date 10/15/19      PT LONG TERM GOAL #2   Title Pt will ambulate >500' on varied surfaces independently for improved community mobility.    Time 8    Period Weeks    Status New    Target Date 10/15/19      PT LONG TERM GOAL #3   Title Pt will decrease 5 x sit to stand from 20.39 sec to <16 sec from mat with hands for improved balance and functional strength.    Baseline 20.39 sec from mat with hands    Time 8    Period Weeks    Status New    Target Date 10/15/19      PT LONG TERM GOAL #4  Title Pt will increase FGA from 7/30 to >19/30 for improved balance and gait safety.    Baseline 7/30 on 08/16/19    Time 8    Period Weeks    Status New    Target  Date 10/15/19                 Plan - 09/28/19 0749    Clinical Impression Statement Pt continues to show progress with balance and gait activities. Focused more on stepping up today as pt gets nervous about steps and is tentative to initiate but improved with practice.    Personal Factors and Comorbidities Comorbidity 3+    Comorbidities DM2, HTN, morbid obesity, a-fib    Examination-Activity Limitations Stairs;Transfers;Locomotion Level    Examination-Participation Restrictions Community Activity;Cleaning    Stability/Clinical Decision Making Evolving/Moderate complexity    Rehab Potential Good    PT Frequency 2x / week    PT Duration 8 weeks    PT Treatment/Interventions ADLs/Self Care Home Management;Aquatic Therapy;DME Instruction;Therapeutic activities;Functional mobility training;Stair training;Gait training;Therapeutic exercise;Balance training;Neuromuscular re-education;Vestibular;Patient/family education    PT Next Visit Plan Had pt schedule more visits as do plan to recert 2w2, 1w4 at end of current orders at this time. Continue gait training on varied surfaces, balance exercise progression. Monitor HR.    Consulted and Agree with Plan of Care Patient;Family member/caregiver    Family Member Consulted son, [name]           Patient will benefit from skilled therapeutic intervention in order to improve the following deficits and impairments:  Abnormal gait, Decreased activity tolerance, Decreased balance, Decreased mobility, Decreased knowledge of use of DME  Visit Diagnosis: Other abnormalities of gait and mobility  Muscle weakness (generalized)  Unsteadiness on feet     Problem List Patient Active Problem List   Diagnosis Date Noted  . Paroxysmal atrial fibrillation ([place]) 08/08/2019  . Secondary hypercoagulable state ([place]) 08/08/2019  . Acute cerebrovascular accident of cerebellum ([place]) 07/31/2019  . GERD (gastroesophageal reflux disease)   . Leukocytosis   .  Morbid obesity ([place]) 01/25/2018  . Chest pain with low risk for cardiac etiology 12/31/2016  . Essential hypertension 12/31/2016  . Hyperlipidemia 12/31/2016  . DM (diabetes mellitus), type 2 with complications ([place]) 08/27/5613    Electa Sniff, PT, DPT, NCS 09/28/2019, 7:51 AM  [facility] [address] [place], [place], [address] Phone: [phone]   Fax:  [phone]  Name: [name] MRN: [id number] Date of Birth: 1945-12-04

## 2019-10-02 ENCOUNTER — Other Ambulatory Visit: Payer: Self-pay

## 2019-10-02 ENCOUNTER — Ambulatory Visit: Payer: Medicare Other

## 2019-10-02 VITALS — BP 162/86

## 2019-10-02 DIAGNOSIS — M6281 Muscle weakness (generalized): Secondary | ICD-10-CM

## 2019-10-02 DIAGNOSIS — R2689 Other abnormalities of gait and mobility: Secondary | ICD-10-CM

## 2019-10-02 DIAGNOSIS — R2681 Unsteadiness on feet: Secondary | ICD-10-CM

## 2019-10-03 NOTE — Therapy (Signed)
[facility] [address] [place], [place], [address] Phone: [phone]   Fax:  [phone]  Physical Therapy Treatment  Patient Details  Name: [name] MRN: [id number] Date of Birth: 1945-11-06 Referring Provider (PT): Frann Rider will be following   Encounter Date: 10/02/2019   PT End of Session - 10/02/19 1622    Visit Number 14    Number of Visits 17    Date for PT Re-Evaluation 95/09/32   90 day cert, 60 day poc   Authorization Type [employer] medicare so 10th visit progress note, FOTO    PT Start Time 1620    PT Stop Time 1700    PT Time Calculation (min) 40 min    Equipment Utilized During Treatment Gait belt    Activity Tolerance Patient tolerated treatment well    Behavior During Therapy WFL for tasks assessed/performed           Past Medical History:  Diagnosis Date  . Actinic keratosis    Followed by Dr. Allyson Sabal  . Anxiety   . Diabetes mellitus, type II, insulin dependent ([place])    Associated with obesity: She takes Lantus, Victoza & Actos  . Essential hypertension   . GERD (gastroesophageal reflux disease)   . Hyperlipidemia   . Morbid obesity with BMI of 40.0-44.9, adult [facility])     Past Surgical History:  Procedure Laterality Date  . BREAST BIOPSY  1997  . LEFT HEART CATH AND CORONARY ANGIOGRAPHY N/A 01/05/2017   Procedure: LEFT HEART CATH AND CORONARY ANGIOGRAPHY;  Surgeon: Leonie Man, MD;  Location: [place] [facility];  Service: Cardiovascular: Angiographically normal coronary arteries with a right dominant system.  Normal EF 55-65%.  Marland Kitchen LOOP RECORDER INSERTION N/A 08/01/2019   Procedure: LOOP RECORDER INSERTION;  Surgeon: Constance Haw, MD;  Location: [place] [facility];  Service: Cardiovascular;  Laterality: N/A;    Vitals:   10/02/19 1627  BP: (!) 162/86     Subjective Assessment - 10/02/19 1623    Subjective Pt reports that she is not as good as last week. Has been less  steady the last 3 days but has not had to use her walker at all. Just been more cautious.    Patient is accompained by: Family member   son, Kit   Pertinent History PMH: type 2 diabetes, uncontrolled with hyperglycemia currently not taking any treatment, hypertension, not taking any medication since at least Christmas time, morbid obesity, A-fib    Patient Stated Goals Pt wants to get better so she can be independent.    Currently in Pain? No/denies                             [facility] Adult PT Treatment/Exercise - 10/02/19 1624      Ambulation/Gait   Ambulation/Gait Yes    Ambulation/Gait Assistance 5: Supervision;4: Min guard    Ambulation/Gait Assistance Details Pt was cued to relax arms as she went. Reported having to stop as right hip bothering her some up to 7/10. Resided some with sitting    Ambulation Distance (Feet) 690 Feet    Assistive device None    Gait Pattern Step-through pattern;Decreased arm swing - right;Decreased arm swing - left    Ambulation Surface Level;Indoor      Neuro Re-ed    Neuro Re-ed Details  In // bars: tandem gait 6' x 6 with occasional UE support. Over blue mat: side  stepping without UE support 6' x 6, alternating taps on 4" step standing on blue mat x 10 then standing with 1 foot on step and 1 on mat x 30 sec each position. Then stepping to 4" step with 1 foot and reaching up to touch target then back down x 5 each side. Stepping up on 4" step on blue mat then down off the front x 6. Gait in hall way: gait with head turns left/right 40' x 4 with slight stagger at times then diagonal head turns looking up to wall/ceiling joint and then down to opposite wall/floor joint 40' x 2 with increased stagger CGA/min assist. BP=158/72 after                    PT Short Term Goals - 09/18/19 1635      PT SHORT TERM GOAL #1   Title Pt will be able to perform initial HEP with supervision of family for balance.    Baseline pt has been performing  initial HEP    Time 4    Period Weeks    Status Achieved    Target Date 09/15/19      PT SHORT TERM GOAL #2   Title Pt will ambulate >300' on level surfaces with LRAD versus no device mod I for improved household mobility.    Baseline supervision with gait as varies day to day    Time 4    Period Weeks    Status Partially Met    Target Date 09/15/19      PT SHORT TERM GOAL #3   Title Pt will increase FGA from 7 to >12/30 for improved balance and gait safety.    Baseline 7/30 on 08/16/19, 09/18/19 FGA=14/30    Time 4    Period Weeks    Status Achieved    Target Date 09/15/19             PT Long Term Goals - 08/16/19 1929      PT LONG TERM GOAL #1   Title Pt will be independent with progressive balance program for continued gains on own.    Time 8    Period Weeks    Status New    Target Date 10/15/19      PT LONG TERM GOAL #2   Title Pt will ambulate >500' on varied surfaces independently for improved community mobility.    Time 8    Period Weeks    Status New    Target Date 10/15/19      PT LONG TERM GOAL #3   Title Pt will decrease 5 x sit to stand from 20.39 sec to <16 sec from mat with hands for improved balance and functional strength.    Baseline 20.39 sec from mat with hands    Time 8    Period Weeks    Status New    Target Date 10/15/19      PT LONG TERM GOAL #4   Title Pt will increase FGA from 7/30 to >19/30 for improved balance and gait safety.    Baseline 7/30 on 08/16/19    Time 8    Period Weeks    Status New    Target Date 10/15/19                 Plan - 10/03/19 0731    Clinical Impression Statement PT continued to focus on gait and balance. Pt appeared less nervous today with stepping activities when performed in //  bars.    Personal Factors and Comorbidities Comorbidity 3+    Comorbidities DM2, HTN, morbid obesity, a-fib    Examination-Activity Limitations Stairs;Transfers;Locomotion Level    Examination-Participation Restrictions  Community Activity;Cleaning    Stability/Clinical Decision Making Evolving/Moderate complexity    Rehab Potential Good    PT Frequency 2x / week    PT Duration 8 weeks    PT Treatment/Interventions ADLs/Self Care Home Management;Aquatic Therapy;DME Instruction;Therapeutic activities;Functional mobility training;Stair training;Gait training;Therapeutic exercise;Balance training;Neuromuscular re-education;Vestibular;Patient/family education    PT Next Visit Plan Had pt schedule more visits as do plan to recert 2w2, 1w4 at end of current orders at this time. Continue gait training on varied surfaces, balance exercise progression. Monitor HR.    Consulted and Agree with Plan of Care Patient;Family member/caregiver    Family Member Consulted son, Kit           Patient will benefit from skilled therapeutic intervention in order to improve the following deficits and impairments:  Abnormal gait, Decreased activity tolerance, Decreased balance, Decreased mobility, Decreased knowledge of use of DME  Visit Diagnosis: Other abnormalities of gait and mobility  Unsteadiness on feet  Muscle weakness (generalized)     Problem List Patient Active Problem List   Diagnosis Date Noted  . Paroxysmal atrial fibrillation ([place]) 08/08/2019  . Secondary hypercoagulable state ([place]) 08/08/2019  . Acute cerebrovascular accident of cerebellum ([place]) 07/31/2019  . GERD (gastroesophageal reflux disease)   . Leukocytosis   . Morbid obesity ([place]) 01/25/2018  . Chest pain with low risk for cardiac etiology 12/31/2016  . Essential hypertension 12/31/2016  . Hyperlipidemia 12/31/2016  . DM (diabetes mellitus), type 2 with complications ([place]) 84/05/7541    [name], PT, DPT, NCS 10/03/2019, 7:33 AM  [facility] [address] [place], [place], [address] Phone: [phone]   Fax:  [phone]  Name: [name] MRN: [id number] Date of  Birth: Aug 13, 1945

## 2019-10-04 ENCOUNTER — Ambulatory Visit: Payer: Medicare Other

## 2019-10-04 ENCOUNTER — Other Ambulatory Visit: Payer: Self-pay

## 2019-10-04 DIAGNOSIS — R2689 Other abnormalities of gait and mobility: Secondary | ICD-10-CM

## 2019-10-04 DIAGNOSIS — R2681 Unsteadiness on feet: Secondary | ICD-10-CM

## 2019-10-04 DIAGNOSIS — M6281 Muscle weakness (generalized): Secondary | ICD-10-CM

## 2019-10-04 NOTE — Therapy (Signed)
[facility] [address] [place], [place], [address] Phone: [phone]   Fax:  [phone]  Physical Therapy Treatment  Patient Details  Name: [name] MRN: [id number] Date of Birth: 07/07/1945 Referring Provider (PT): Frann Rider will be following   Encounter Date: 10/04/2019   PT End of Session - 10/04/19 1622    Visit Number 15    Number of Visits 17    Date for PT Re-Evaluation 92/33/00   90 day cert, 60 day poc   Authorization Type UHC medicare so 10th visit progress note, FOTO    PT Start Time 1620    PT Stop Time 1658    PT Time Calculation (min) 38 min    Equipment Utilized During Treatment Gait belt    Activity Tolerance Patient tolerated treatment well    Behavior During Therapy WFL for tasks assessed/performed           Past Medical History:  Diagnosis Date  . Actinic keratosis    Followed by Dr. Allyson Sabal  . Anxiety   . Diabetes mellitus, type II, insulin dependent ([place])    Associated with obesity: She takes Lantus, Victoza & Actos  . Essential hypertension   . GERD (gastroesophageal reflux disease)   . Hyperlipidemia   . Morbid obesity with BMI of 40.0-44.9, adult [facility])     Past Surgical History:  Procedure Laterality Date  . BREAST BIOPSY  1997  . LEFT HEART CATH AND CORONARY ANGIOGRAPHY N/A 01/05/2017   Procedure: LEFT HEART CATH AND CORONARY ANGIOGRAPHY;  Surgeon: Leonie Man, MD;  Location: [place] [facility];  Service: Cardiovascular: Angiographically normal coronary arteries with a right dominant system.  Normal EF 55-65%.  Marland Kitchen LOOP RECORDER INSERTION N/A 08/01/2019   Procedure: LOOP RECORDER INSERTION;  Surgeon: Constance Haw, MD;  Location: [place] [facility];  Service: Cardiovascular;  Laterality: N/A;    There were no vitals filed for this visit.   Subjective Assessment - 10/04/19 1622    Subjective Pt reports that she is doing well today. Pt reports that she  stepped up on curb yesterday and did well.    Patient is accompained by: Family member   son, Kit   Pertinent History PMH: type 2 diabetes, uncontrolled with hyperglycemia currently not taking any treatment, hypertension, not taking any medication since at least Christmas time, morbid obesity, A-fib    Patient Stated Goals Pt wants to get better so she can be independent.    Currently in Pain? No/denies                             [facility] Adult PT Treatment/Exercise - 10/04/19 1624      Transfers   Transfers Sit to Stand;Stand to Sit    Sit to Stand 6: Modified independent (Device/Increase time)    Stand to Sit 6: Modified independent (Device/Increase time)      Ambulation/Gait   Ambulation/Gait Yes    Ambulation/Gait Assistance 5: Supervision    Ambulation/Gait Assistance Details HR=90 after, 5/10 pain in right hip towards end. Pt reports not as bad as he other day. Pt was cued to relax arms and try to increase step length. Pt ambulated 6 min during gait.    Ambulation Distance (Feet) 1035 Feet    Assistive device None    Gait Pattern Step-through pattern    Ambulation Surface Level;Indoor      Neuro Re-ed  Neuro Re-ed Details  Tandem gait along counter 6' x 8 without UE support close SBA. Standing on blue mat in tandem stance 30 sec x 2 each position, on blue mat feet together eyes closed 30 sec x 2 then adding head turns left/right and up/down x 10 each eyes open, Stepping up on airex on blue mat x 5 each direction without UE support CGA at first then standing with one foot on airex and one on mat in staggered stance x 30 sec with adding in head turns left/right x 10 each position with legs. Marching gait in hallway 30' x 2, backwards gait 30' x 2.                     PT Short Term Goals - 09/18/19 1635      PT SHORT TERM GOAL #1   Title Pt will be able to perform initial HEP with supervision of family for balance.    Baseline pt has been performing  initial HEP    Time 4    Period Weeks    Status Achieved    Target Date 09/15/19      PT SHORT TERM GOAL #2   Title Pt will ambulate >300' on level surfaces with LRAD versus no device mod I for improved household mobility.    Baseline supervision with gait as varies day to day    Time 4    Period Weeks    Status Partially Met    Target Date 09/15/19      PT SHORT TERM GOAL #3   Title Pt will increase FGA from 7 to >12/30 for improved balance and gait safety.    Baseline 7/30 on 08/16/19, 09/18/19 FGA=14/30    Time 4    Period Weeks    Status Achieved    Target Date 09/15/19             PT Long Term Goals - 08/16/19 1929      PT LONG TERM GOAL #1   Title Pt will be independent with progressive balance program for continued gains on own.    Time 8    Period Weeks    Status New    Target Date 10/15/19      PT LONG TERM GOAL #2   Title Pt will ambulate >500' on varied surfaces independently for improved community mobility.    Time 8    Period Weeks    Status New    Target Date 10/15/19      PT LONG TERM GOAL #3   Title Pt will decrease 5 x sit to stand from 20.39 sec to <16 sec from mat with hands for improved balance and functional strength.    Baseline 20.39 sec from mat with hands    Time 8    Period Weeks    Status New    Target Date 10/15/19      PT LONG TERM GOAL #4   Title Pt will increase FGA from 7/30 to >19/30 for improved balance and gait safety.    Baseline 7/30 on 08/16/19    Time 8    Period Weeks    Status New    Target Date 10/15/19                 Plan - 10/04/19 1956    Clinical Impression Statement Pt continues to show improved stability with balance activities. Pt reporting good carry over with being able to step up on  curb this week when out with daughter much easier.    Personal Factors and Comorbidities Comorbidity 3+    Comorbidities DM2, HTN, morbid obesity, a-fib    Examination-Activity Limitations Stairs;Transfers;Locomotion  Level    Examination-Participation Restrictions Community Activity;Cleaning    Stability/Clinical Decision Making Evolving/Moderate complexity    Rehab Potential Good    PT Frequency 2x / week    PT Duration 8 weeks    PT Treatment/Interventions ADLs/Self Care Home Management;Aquatic Therapy;DME Instruction;Therapeutic activities;Functional mobility training;Stair training;Gait training;Therapeutic exercise;Balance training;Neuromuscular re-education;Vestibular;Patient/family education    PT Next Visit Plan Recert next week. Had pt schedule more visits as do plan to recert 2w2, 1w4 at end of current orders at this time. Continue gait training on varied surfaces, balance exercise progression. Monitor HR.    Consulted and Agree with Plan of Care Patient;Family member/caregiver    Family Member Consulted son, Kit           Patient will benefit from skilled therapeutic intervention in order to improve the following deficits and impairments:  Abnormal gait, Decreased activity tolerance, Decreased balance, Decreased mobility, Decreased knowledge of use of DME  Visit Diagnosis: Other abnormalities of gait and mobility  Muscle weakness (generalized)  Unsteadiness on feet     Problem List Patient Active Problem List   Diagnosis Date Noted  . Paroxysmal atrial fibrillation ([place]) 08/08/2019  . Secondary hypercoagulable state ([place]) 08/08/2019  . Acute cerebrovascular accident of cerebellum ([place]) 07/31/2019  . GERD (gastroesophageal reflux disease)   . Leukocytosis   . Morbid obesity ([place]) 01/25/2018  . Chest pain with low risk for cardiac etiology 12/31/2016  . Essential hypertension 12/31/2016  . Hyperlipidemia 12/31/2016  . DM (diabetes mellitus), type 2 with complications ([place]) 30/16/0109    Electa Sniff, PT, DPT, NCS 10/04/2019, 7:59 PM  [facility] [address] [place], [place], [address] Phone: [phone]   Fax:   [phone]  Name: [name] MRN: [id number] Date of Birth: [birth year]

## 2019-10-08 ENCOUNTER — Ambulatory Visit (INDEPENDENT_AMBULATORY_CARE_PROVIDER_SITE_OTHER): Payer: Medicare Other | Admitting: *Deleted

## 2019-10-08 DIAGNOSIS — I639 Cerebral infarction, unspecified: Secondary | ICD-10-CM | POA: Diagnosis not present

## 2019-10-08 LAB — CUP PACEART REMOTE DEVICE CHECK
Date Time Interrogation Session: 20210711230421
Implantable Pulse Generator Implant Date: 20210505

## 2019-10-09 ENCOUNTER — Other Ambulatory Visit: Payer: Self-pay

## 2019-10-09 ENCOUNTER — Ambulatory Visit: Payer: Medicare Other

## 2019-10-09 VITALS — BP 142/76

## 2019-10-09 DIAGNOSIS — R2681 Unsteadiness on feet: Secondary | ICD-10-CM

## 2019-10-09 DIAGNOSIS — M6281 Muscle weakness (generalized): Secondary | ICD-10-CM

## 2019-10-09 DIAGNOSIS — R2689 Other abnormalities of gait and mobility: Secondary | ICD-10-CM

## 2019-10-09 NOTE — Progress Notes (Signed)
[facility] Summary Report / Loop Recorder 

## 2019-10-09 NOTE — Therapy (Signed)
[facility] [address] [place], [place], [address] Phone: [phone]   Fax:  [phone]  Physical Therapy Treatment  Patient Details  Name: [name] MRN: [id number] Date of Birth: [birth year] Referring Provider (PT): Frann Rider will be following   Encounter Date: 10/09/2019   PT End of Session - 10/09/19 1618    Visit Number 16    Number of Visits 17    Date for PT Re-Evaluation 55/37/48   90 day cert, 60 day poc   Authorization Type [employer] medicare so 10th visit progress note, FOTO    PT Start Time 1615    PT Stop Time 1657    PT Time Calculation (min) 42 min    Equipment Utilized During Treatment Gait belt    Activity Tolerance Patient tolerated treatment well    Behavior During Therapy WFL for tasks assessed/performed           Past Medical History:  Diagnosis Date  . Actinic keratosis    Followed by Dr. Allyson Sabal  . Anxiety   . Diabetes mellitus, type II, insulin dependent ([place])    Associated with obesity: She takes Lantus, Victoza & Actos  . Essential hypertension   . GERD (gastroesophageal reflux disease)   . Hyperlipidemia   . Morbid obesity with BMI of 40.0-44.9, adult [facility])     Past Surgical History:  Procedure Laterality Date  . BREAST BIOPSY  1997  . LEFT HEART CATH AND CORONARY ANGIOGRAPHY N/A 01/05/2017   Procedure: LEFT HEART CATH AND CORONARY ANGIOGRAPHY;  Surgeon: Leonie Man, MD;  Location: [place] [facility];  Service: Cardiovascular: Angiographically normal coronary arteries with a right dominant system.  Normal EF 55-65%.  Marland Kitchen LOOP RECORDER INSERTION N/A 08/01/2019   Procedure: LOOP RECORDER INSERTION;  Surgeon: Constance Haw, MD;  Location: [place] [facility];  Service: Cardiovascular;  Laterality: N/A;    Vitals:   10/09/19 1624  BP: (!) 142/76     Subjective Assessment - 10/09/19 1618    Subjective Pt reports that she has been better but is not too bad today.  Saturday was a bad day and had to use walker that day. Was feeling less steady and slightly dizzy that day. Reports blood sugar has been doing good.    Patient is accompained by: Family member   son, [name]   Pertinent History PMH: type 2 diabetes, uncontrolled with hyperglycemia currently not taking any treatment, hypertension, not taking any medication since at least Christmas time, morbid obesity, A-fib    Patient Stated Goals Pt wants to get better so she can be independent.    Currently in Pain? No/denies                             [facility] Adult PT Treatment/Exercise - 10/09/19 1621      Ambulation/Gait   Ambulation/Gait Yes    Ambulation/Gait Assistance 6: Modified independent (Device/Increase time);5: Supervision    Ambulation/Gait Assistance Details Pt ambulated with marching gait x 115' and then head turns side to side x 115' CGA but no LOB throughout.    Ambulation Distance (Feet) 805 Feet    Assistive device None    Gait Pattern Step-through pattern;Decreased arm swing - right;Decreased arm swing - left    Ambulation Surface Level;Indoor    Ramp 5: Supervision    Ramp Details (indicate cue type and reason) x 2 with decreased step length  Neuro Re-ed    Neuro Re-ed Details  Standing on mat in // bars with tandem stance 30 sec x 2 each position, feet together standing on mat with head turn up/down x 10. Standing on blue mat on ramp facing uphill: feet apart with head turns left/right x 10 and up/down x 10 CGA, marching in place on mat on ramp x 10 CGA. Standing next to counter: alternating toe taps on soccerball x 10 bilateral without UE support CGA. Pt has excessive lateral lean with more trendelenberg stance with activity. Standing with 1 foot on soccerball 20 sec x 2 each position. Pt pushing down harder on ball when on right SLS. CGA for safety. Standing in front of counter reaching across body and down to grab cone then bringing up in to cabinet overhead x 8 each  side. Pt denied any dizziness.                    PT Short Term Goals - 10/09/19 1705      PT SHORT TERM GOAL #1   Title Pt will be able to perform initial HEP with supervision of family for balance.    Baseline pt has been performing initial HEP    Time 4    Period Weeks    Status Achieved    Target Date 09/15/19      PT SHORT TERM GOAL #2   Title Pt will ambulate >300' on level surfaces with LRAD versus no device mod I for improved household mobility.    Baseline pt is now able to walk on level surfaces independently.    Time 4    Period Weeks    Status Achieved    Target Date 09/15/19      PT SHORT TERM GOAL #3   Title Pt will increase FGA from 7 to >12/30 for improved balance and gait safety.    Baseline 7/30 on 08/16/19, 09/18/19 FGA=14/30    Time 4    Period Weeks    Status Achieved    Target Date 09/15/19             PT Long Term Goals - 10/09/19 1705      PT LONG TERM GOAL #1   Title Pt will be independent with progressive balance program for continued gains on own.    Time 8    Period Weeks    Status New      PT LONG TERM GOAL #2   Title Pt will ambulate >500' on varied surfaces independently for improved community mobility.    Baseline supervision on nonlevel surfaces.    Time 8    Period Weeks    Status Not Met      PT LONG TERM GOAL #3   Title Pt will decrease 5 x sit to stand from 20.39 sec to <16 sec from mat with hands for improved balance and functional strength.    Baseline 20.39 sec from mat with hands    Time 8    Period Weeks    Status New      PT LONG TERM GOAL #4   Title Pt will increase FGA from 7/30 to >19/30 for improved balance and gait safety.    Baseline 7/30 on 08/16/19    Time 8    Period Weeks    Status New                 Plan - 10/09/19 1706    Clinical Impression Statement  Pt continues to show progress with balance with PT increasing difficulty with activities. Pt less steady with increased right SLS  time compared to left with trendelenberg noted. Pt denied any dizziness with activities. Pt and son report she seems to be most bothered when has to look overhead in to cabinets or shelves for longer time >5 sec.    Personal Factors and Comorbidities Comorbidity 3+    Comorbidities DM2, HTN, morbid obesity, a-fib    Examination-Activity Limitations Stairs;Transfers;Locomotion Level    Examination-Participation Restrictions Community Activity;Cleaning    Stability/Clinical Decision Making Evolving/Moderate complexity    Rehab Potential Good    PT Frequency 2x / week    PT Duration 8 weeks    PT Treatment/Interventions ADLs/Self Care Home Management;Aquatic Therapy;DME Instruction;Therapeutic activities;Functional mobility training;Stair training;Gait training;Therapeutic exercise;Balance training;Neuromuscular re-education;Vestibular;Patient/family education    PT Next Visit Plan Recert next visit. Had pt schedule more visits as do plan to recert 2w2, 1w4 at end of current orders at this time. Continue gait training on varied surfaces, balance exercise progression. Monitor HR.    Consulted and Agree with Plan of Care Patient;Family member/caregiver    Family Member Consulted son, [name]           Patient will benefit from skilled therapeutic intervention in order to improve the following deficits and impairments:  Abnormal gait, Decreased activity tolerance, Decreased balance, Decreased mobility, Decreased knowledge of use of DME  Visit Diagnosis: Other abnormalities of gait and mobility  Unsteadiness on feet  Muscle weakness (generalized)     Problem List Patient Active Problem List   Diagnosis Date Noted  . Paroxysmal atrial fibrillation ([place]) 08/08/2019  . Secondary hypercoagulable state ([place]) 08/08/2019  . Acute cerebrovascular accident of cerebellum ([place]) 07/31/2019  . GERD (gastroesophageal reflux disease)   . Leukocytosis   . Morbid obesity ([place]) 01/25/2018  . Chest pain  with low risk for cardiac etiology 12/31/2016  . Essential hypertension 12/31/2016  . Hyperlipidemia 12/31/2016  . DM (diabetes mellitus), type 2 with complications ([place]) 64/33/2951    Electa Sniff, PT, DPT, NCS 10/09/2019, 5:08 PM  [facility] [address] [place], [place], [address] Phone: [phone]   Fax:  [phone]  Name: Brylee Berk MRN: [id number] Date of Birth: 07/03/1945

## 2019-10-11 ENCOUNTER — Ambulatory Visit: Payer: Medicare Other

## 2019-10-11 ENCOUNTER — Other Ambulatory Visit: Payer: Self-pay

## 2019-10-11 DIAGNOSIS — M6281 Muscle weakness (generalized): Secondary | ICD-10-CM

## 2019-10-11 DIAGNOSIS — R2689 Other abnormalities of gait and mobility: Secondary | ICD-10-CM | POA: Diagnosis not present

## 2019-10-11 DIAGNOSIS — R2681 Unsteadiness on feet: Secondary | ICD-10-CM

## 2019-10-11 NOTE — Patient Instructions (Signed)
Access Code: [email or web address] URL: [email or web address] Date: 10/11/2019 Prepared by: Cherly Anderson  Exercises Sit to Stand - 2 x daily - 7 x weekly - 2 sets - 5 reps Standing eyes open - 2 x daily - 7 x weekly - 1 sets - 3 reps - 30 sec hold Standing Romberg to 1/2 Tandem Stance - 2 x daily - 7 x weekly - 1 sets - 3 reps - 30 sec hold Standing with Head Rotation - 2 x daily - 7 x weekly - 2 sets - 5 reps Walking March - 1 x daily - 5 x weekly - 1 sets - 4 reps Side Stepping with Counter Support - 1 x daily - 5 x weekly - 1 sets - 4 reps

## 2019-10-12 NOTE — Therapy (Signed)
[facility] [address] [place], [place], [address] Phone: [phone]   Fax:  [phone]  Physical Therapy Treatment  Patient Details  Name: [name] MRN: [id number] Date of Birth: 16-Sep-1945 Referring Provider (PT): Frann Rider will be following   Encounter Date: 10/11/2019   PT End of Session - 10/11/19 1618    Visit Number 17    Number of Visits 25    Date for PT Re-Evaluation 29/51/88   90 day cert, 60 day poc   Authorization Type [employer] medicare so 10th visit progress note, FOTO    PT Start Time 1617    PT Stop Time 1655    PT Time Calculation (min) 38 min    Equipment Utilized During Treatment Gait belt    Activity Tolerance Patient tolerated treatment well    Behavior During Therapy WFL for tasks assessed/performed           Past Medical History:  Diagnosis Date  . Actinic keratosis    Followed by Dr. Allyson Sabal  . Anxiety   . Diabetes mellitus, type II, insulin dependent ([place])    Associated with obesity: She takes Lantus, Victoza & Actos  . Essential hypertension   . GERD (gastroesophageal reflux disease)   . Hyperlipidemia   . Morbid obesity with BMI of 40.0-44.9, adult [facility])     Past Surgical History:  Procedure Laterality Date  . BREAST BIOPSY  1997  . LEFT HEART CATH AND CORONARY ANGIOGRAPHY N/A 01/05/2017   Procedure: LEFT HEART CATH AND CORONARY ANGIOGRAPHY;  Surgeon: Leonie Man, MD;  Location: [facility];  Service: Cardiovascular: Angiographically normal coronary arteries with a right dominant system.  Normal EF 55-65%.  Marland Kitchen LOOP RECORDER INSERTION N/A 08/01/2019   Procedure: LOOP RECORDER INSERTION;  Surgeon: Constance Haw, MD;  Location: [facility];  Service: Cardiovascular;  Laterality: N/A;    There were no vitals filed for this visit.   Subjective Assessment - 10/11/19 1619    Subjective Pt reports that she is doing well.    Patient is accompained  by: Family member   son, Kit   Pertinent History PMH: type 2 diabetes, uncontrolled with hyperglycemia currently not taking any treatment, hypertension, not taking any medication since at least Christmas time, morbid obesity, A-fib    Patient Stated Goals Pt wants to get better so she can be independent.    Currently in Pain? No/denies              [facility] PT Assessment - 10/11/19 1622      Functional Gait  Assessment   Gait assessed  Yes    Gait Level Surface Walks 20 ft in less than 7 sec but greater than 5.5 sec, uses assistive device, slower speed, mild gait deviations, or deviates 6-10 in outside of the 12 in walkway width.    Change in Gait Speed Able to change speed, demonstrates mild gait deviations, deviates 6-10 in outside of the 12 in walkway width, or no gait deviations, unable to achieve a major change in velocity, or uses a change in velocity, or uses an assistive device.    Gait with Horizontal Head Turns Performs head turns smoothly with no change in gait. Deviates no more than 6 in outside 12 in walkway width    Gait with Vertical Head Turns Performs task with slight change in gait velocity (eg, minor disruption to smooth gait path), deviates 6 - 10 in outside 12 in walkway  width or uses assistive device    Gait and Pivot Turn Pivot turns safely within 3 sec and stops quickly with no loss of balance.    Step Over Obstacle Is able to step over one shoe box (4.5 in total height) without changing gait speed. No evidence of imbalance.    Gait with Narrow Base of Support Is able to ambulate for 10 steps heel to toe with no staggering.    Gait with Eyes Closed Walks 20 ft, uses assistive device, slower speed, mild gait deviations, deviates 6-10 in outside 12 in walkway width. Ambulates 20 ft in less than 9 sec but greater than 7 sec.    Ambulating Backwards Walks 20 ft, uses assistive device, slower speed, mild gait deviations, deviates 6-10 in outside 12 in walkway width.    Steps  Alternating feet, must use rail.    Total Score 23                         [facility] Adult PT Treatment/Exercise - 10/11/19 1622      Transfers   Transfers Sit to Stand;Stand to Sit    Sit to Stand 7: Independent    Five time sit to stand comments  16.35 sec from mat with hands in lap    Stand to Sit 7: Independent      Ambulation/Gait   Ambulation/Gait Yes    Ambulation/Gait Assistance 7: Independent    Ambulation/Gait Assistance Details Pt ambulated for 6 min straight in clinic on level surfaces. HR=90 after. Pt with good reciprocal pattern throughout.    Ambulation Distance (Feet) 1000 Feet    Assistive device None    Gait Pattern Step-through pattern    Ambulation Surface Level;Indoor      Neuro Re-ed    Neuro Re-ed Details  Standing without UE support looking up at a diagonal x 30 sec each side. Pt with 1 small balance check looking up and holding to right after 10 sec but resided quickly. Denied any dizziness.                   PT Education - 10/12/19 0815    Education Details Discussed recert plan    Person(s) Educated Patient;Child(ren)    Methods Explanation    Comprehension Verbalized understanding            PT Short Term Goals - 10/09/19 1705      PT SHORT TERM GOAL #1   Title Pt will be able to perform initial HEP with supervision of family for balance.    Baseline pt has been performing initial HEP    Time 4    Period Weeks    Status Achieved    Target Date 09/15/19      PT SHORT TERM GOAL #2   Title Pt will ambulate >300' on level surfaces with LRAD versus no device mod I for improved household mobility.    Baseline pt is now able to walk on level surfaces independently.    Time 4    Period Weeks    Status Achieved    Target Date 09/15/19      PT SHORT TERM GOAL #3   Title Pt will increase FGA from 7 to >12/30 for improved balance and gait safety.    Baseline 7/30 on 08/16/19, 09/18/19 FGA=14/30    Time 4    Period Weeks     Status Achieved    Target Date 09/15/19  PT Long Term Goals - 10/11/19 1630      PT LONG TERM GOAL #1   Title Pt will be independent with progressive balance program for continued gains on own.    Time 8    Period Weeks    Status New      PT LONG TERM GOAL #2   Title Pt will ambulate >500' on varied surfaces independently for improved community mobility.    Baseline supervision on nonlevel surfaces.    Time 8    Period Weeks    Status Not Met      PT LONG TERM GOAL #3   Title Pt will decrease 5 x sit to stand from 20.39 sec to <16 sec from mat with hands for improved balance and functional strength.    Baseline 20.39 sec from mat with hands, 16.35 sec on 10/11/19    Time 8    Period Weeks    Status Partially Met      PT LONG TERM GOAL #4   Title Pt will increase FGA from 7/30 to >19/30 for improved balance and gait safety.    Baseline 7/30 on 08/16/19, 23/30 on FGA on 10/11/19    Time 8    Period Weeks    Status Achieved           Updated PT goals:  PT Short Term Goals - 10/12/19 0834      PT SHORT TERM GOAL #1   Title Pt will decrease 5 x sit to stand from 16 to <14 for improved functional strength and balance.    Baseline 16 sec on 10/11/19    Time 3    Period Weeks    Status New    Target Date 11/02/19      PT SHORT TERM GOAL #2   Title Pt will be able to reach overhead to perform activity x 1 min consistently with no reports of unsteadiness for improved safety.    Time 3    Period Weeks    Status New    Target Date 11/02/19           PT Long Term Goals - 10/12/19 0836      PT LONG TERM GOAL #1   Title Pt will be independent with progressive balance program for continued gains on own.    Time 6    Period Weeks    Status On-going    Target Date 11/23/19      PT LONG TERM GOAL #2   Title Pt will ambulate >500' on varied surfaces independently for improved community mobility.    Baseline supervision on nonlevel surfaces.    Time 6     Period Weeks    Status On-going    Target Date 11/23/19      PT LONG TERM GOAL #3   Title Pt will be able to ambulate up/down curb consistently independently with no reports of unsteadiness for improved community access.    Time 6    Period Weeks    Status New    Target Date 11/23/19      PT LONG TERM GOAL #4   Title Pt will increase FGA from 23/30 to >25/30 for improved balance and gait safety.    Baseline 7/30 on 08/16/19, 23/30 on FGA on 10/11/19    Time 6    Period Weeks    Status Revised    Target Date 11/23/19  Plan - 10/12/19 0819    Clinical Impression Statement Pt continues to show improvement in gait and balance activities. Pt was able to decrease 5 x sit to stand just short of LTG today. Pt met FGA goal with score of 23/30 still indicating moderate fall risk but much improved from intial of 7/30. Pt has been able to increase gait distance with more consistency with stability and is now independent on level surfaces. Still needs supervision on nonlevel. Pt self reports less "unsteady" days and has only had to use walker 1 day in last 2 weeks. Pt will continue to benefit from skilled PT to continue to address balance and functional mobility deficits to maximize safety and independence in the community.    Personal Factors and Comorbidities Comorbidity 3+    Comorbidities DM2, HTN, morbid obesity, a-fib    Examination-Activity Limitations Stairs;Transfers;Locomotion Level    Examination-Participation Restrictions Community Activity;Cleaning    Stability/Clinical Decision Making Evolving/Moderate complexity    Rehab Potential Good    PT Frequency 2x / week   followed by 1x/week for 4 weeks.   PT Duration 2 weeks    PT Treatment/Interventions ADLs/Self Care Home Management;Aquatic Therapy;DME Instruction;Therapeutic activities;Functional mobility training;Stair training;Gait training;Therapeutic exercise;Balance training;Neuromuscular  re-education;Vestibular;Patient/family education    PT Next Visit Plan Overhead activities for longer periods with balance.  Continue gait training on varied surfaces, balance exercise progression. Monitor HR.    Consulted and Agree with Plan of Care Patient;Family member/caregiver    Family Member Consulted son, Kit           Patient will benefit from skilled therapeutic intervention in order to improve the following deficits and impairments:  Abnormal gait, Decreased activity tolerance, Decreased balance, Decreased mobility, Decreased knowledge of use of DME  Visit Diagnosis: Other abnormalities of gait and mobility  Muscle weakness (generalized)  Unsteadiness on feet     Problem List Patient Active Problem List   Diagnosis Date Noted  . Paroxysmal atrial fibrillation ([place]) 08/08/2019  . Secondary hypercoagulable state ([place]) 08/08/2019  . Acute cerebrovascular accident of cerebellum ([place]) 07/31/2019  . GERD (gastroesophageal reflux disease)   . Leukocytosis   . Morbid obesity ([place]) 01/25/2018  . Chest pain with low risk for cardiac etiology 12/31/2016  . Essential hypertension 12/31/2016  . Hyperlipidemia 12/31/2016  . DM (diabetes mellitus), type 2 with complications ([place]) 45/62/5638    Electa Sniff, PT, DPT, NCS 10/12/2019, 8:31 AM  [facility] [address] [place], [place], [address] Phone: [phone]   Fax:  [phone]  Name: Lark Langenfeld MRN: [id number] Date of Birth: [birth year]

## 2019-10-16 ENCOUNTER — Ambulatory Visit: Payer: Medicare Other

## 2019-10-16 ENCOUNTER — Other Ambulatory Visit: Payer: Self-pay

## 2019-10-16 DIAGNOSIS — M6281 Muscle weakness (generalized): Secondary | ICD-10-CM

## 2019-10-16 DIAGNOSIS — R2689 Other abnormalities of gait and mobility: Secondary | ICD-10-CM

## 2019-10-16 DIAGNOSIS — R2681 Unsteadiness on feet: Secondary | ICD-10-CM

## 2019-10-16 NOTE — Therapy (Signed)
[facility] [address] [place], [place], [address] Phone: [phone]   Fax:  [phone]  Physical Therapy Treatment  Patient Details  Name: [name] MRN: [id number] Date of Birth: 08/07/1945 Referring Provider (PT): Ihor Austin will be following   Encounter Date: 10/16/2019   PT End of Session - 10/16/19 1616    Visit Number 18    Number of Visits 25    Date for PT Re-Evaluation 12/11/19   90 day cert, 60 day poc   Authorization Type [employer] medicare so 10th visit progress note, FOTO    PT Start Time 1615    PT Stop Time 1658    PT Time Calculation (min) 43 min    Equipment Utilized During Treatment Gait belt    Activity Tolerance Patient tolerated treatment well    Behavior During Therapy WFL for tasks assessed/performed           Past Medical History:  Diagnosis Date  . Actinic keratosis    Followed by Dr. Terri Piedra  . Anxiety   . Diabetes mellitus, type II, insulin dependent ([facility])    Associated with obesity: She takes Lantus, Victoza & Actos  . Essential hypertension   . GERD (gastroesophageal reflux disease)   . Hyperlipidemia   . Morbid obesity with BMI of 40.0-44.9, adult [facility])     Past Surgical History:  Procedure Laterality Date  . BREAST BIOPSY  1997  . LEFT HEART CATH AND CORONARY ANGIOGRAPHY N/A 01/05/2017   Procedure: LEFT HEART CATH AND CORONARY ANGIOGRAPHY;  Surgeon: Marykay Lex, MD;  Location: [facility];  Service: Cardiovascular: Angiographically normal coronary arteries with a right dominant system.  Normal EF 55-65%.  Marland Kitchen LOOP RECORDER INSERTION N/A 08/01/2019   Procedure: LOOP RECORDER INSERTION;  Surgeon: Regan Lemming, MD;  Location: [facility]AS[facility];  Service: Cardiovascular;  Laterality: N/A;    There were no vitals filed for this visit.   Subjective Assessment - 10/16/19 1617    Subjective Pt reports that she has been doing well. No dizzy or unsteady  episodes.    Patient is accompained by: Family member   son, [name]   Pertinent History PMH: type 2 diabetes, uncontrolled with hyperglycemia currently not taking any treatment, hypertension, not taking any medication since at least Christmas time, morbid obesity, A-fib    Patient Stated Goals Pt wants to get better so she can be independent.    Currently in Pain? No/denies                             [facility] Adult PT Treatment/Exercise - 10/16/19 1619      Ambulation/Gait   Ambulation/Gait Yes    Ambulation/Gait Assistance 5: Supervision    Ambulation/Gait Assistance Details Pt was given verbal cues to relax arms for more arm swing. HR= 70 after.     Ambulation Distance (Feet) 850 Feet    Assistive device None    Gait Pattern Step-through pattern;Decreased arm swing - right;Decreased arm swing - left    Ambulation Surface Level;Unlevel;Outdoor;Paved;Gravel;Grass;Other (comment)   mulch   Curb 5: Supervision      Neuro Re-ed    Neuro Re-ed Details  In // bars: step-ups on black foam beam with occasional light UE support initially CGA/min assist getting balance each time before stepping down x 5 each leg. Pt was cued to shift weight over leg and tighten bottom to help. Standing on  blue foam beam x 30 sec without UE support then with alternating shoulder flexion x 10 then with head turns up/down x 10 CGA. Posterior trust falls x 6 to initiate posterior step strategy in // bars. Pt able to initiate small step on left foot each time to catch herself. Standing on blue foam beam playing connect 4 x 3 min with game positioned up higher so she had to reach up  to play discs CGA. Gait in hallway playing catch with son x 60' forwrad and x 35' backwards CGA. Sit to stand from mat on blue foam beam x 10 CGA.                    PT Short Term Goals - 10/12/19 0834      PT SHORT TERM GOAL #1   Title Pt will decrease 5 x sit to stand from 16 to <14 for improved functional strength  and balance.    Baseline 16 sec on 10/11/19    Time 3    Period Weeks    Status New    Target Date 11/02/19      PT SHORT TERM GOAL #2   Title Pt will be able to reach overhead to perform activity x 1 min consistently with no reports of unsteadiness for improved safety.    Time 3    Period Weeks    Status New    Target Date 11/02/19             PT Long Term Goals - 10/12/19 0836      PT LONG TERM GOAL #1   Title Pt will be independent with progressive balance program for continued gains on own.    Time 6    Period Weeks    Status On-going    Target Date 11/23/19      PT LONG TERM GOAL #2   Title Pt will ambulate >500' on varied surfaces independently for improved community mobility.    Baseline supervision on nonlevel surfaces.    Time 6    Period Weeks    Status On-going    Target Date 11/23/19      PT LONG TERM GOAL #3   Title Pt will be able to ambulate up/down curb consistently independently with no reports of unsteadiness for improved community access.    Time 6    Period Weeks    Status New    Target Date 11/23/19      PT LONG TERM GOAL #4   Title Pt will increase FGA from 23/30 to >25/30 for improved balance and gait safety.    Baseline 7/30 on 08/16/19, 23/30 on FGA on 10/11/19    Time 6    Period Weeks    Status Revised    Target Date 11/23/19                 Plan - 10/16/19 2055    Clinical Impression Statement PT continued to focus on progressive balance activities. Pt continues to show improving stability. Also worked more on posterior stepping strategy. Pt showing improvement with this and balance reaction corrections throughout.    Personal Factors and Comorbidities Comorbidity 3+    Comorbidities DM2, HTN, morbid obesity, a-fib    Examination-Activity Limitations Stairs;Transfers;Locomotion Level    Examination-Participation Restrictions Community Activity;Cleaning    Stability/Clinical Decision Making Evolving/Moderate complexity     Rehab Potential Good    PT Frequency 2x / week   followed by 1x/week for 4  weeks.   PT Duration 2 weeks    PT Treatment/Interventions ADLs/Self Care Home Management;Aquatic Therapy;DME Instruction;Therapeutic activities;Functional mobility training;Stair training;Gait training;Therapeutic exercise;Balance training;Neuromuscular re-education;Vestibular;Patient/family education    PT Next Visit Plan Update balance HEP. Overhead activities for longer periods with balance.  Continue gait training on varied surfaces, balance exercise progression. Monitor HR.    Consulted and Agree with Plan of Care Patient;Family member/caregiver    Family Member Consulted son, [name]           Patient will benefit from skilled therapeutic intervention in order to improve the following deficits and impairments:  Abnormal gait, Decreased activity tolerance, Decreased balance, Decreased mobility, Decreased knowledge of use of DME  Visit Diagnosis: Other abnormalities of gait and mobility  Unsteadiness on feet  Muscle weakness (generalized)     Problem List Patient Active Problem List   Diagnosis Date Noted  . Paroxysmal atrial fibrillation ([place]) 08/08/2019  . Secondary hypercoagulable state ([place]) 08/08/2019  . Acute cerebrovascular accident of cerebellum ([place]) 07/31/2019  . GERD (gastroesophageal reflux disease)   . Leukocytosis   . Morbid obesity ([place]) 01/25/2018  . Chest pain with low risk for cardiac etiology 12/31/2016  . Essential hypertension 12/31/2016  . Hyperlipidemia 12/31/2016  . DM (diabetes mellitus), type 2 with complications ([place]) 95/18/8416    Electa Sniff, PT, DPT, NCS 10/16/2019, 8:58 PM  [facility] [address] [place], [place], [address] Phone: [phone]   Fax:  [phone]  Name: [name] MRN: [id number] Date of Birth: [birth year]

## 2019-10-18 ENCOUNTER — Other Ambulatory Visit: Payer: Self-pay

## 2019-10-18 ENCOUNTER — Ambulatory Visit: Payer: Medicare Other

## 2019-10-18 DIAGNOSIS — R2689 Other abnormalities of gait and mobility: Secondary | ICD-10-CM | POA: Diagnosis not present

## 2019-10-18 DIAGNOSIS — M6281 Muscle weakness (generalized): Secondary | ICD-10-CM

## 2019-10-18 NOTE — Therapy (Signed)
[facility] [address] [place], [place], [address] Phone: [phone]   Fax:  [phone]  Physical Therapy Treatment  Patient Details  Name: [name] MRN: [id number] Date of Birth: 17-Jul-1945 Referring Provider (PT): Frann Rider will be following   Encounter Date: 10/18/2019   PT End of Session - 10/18/19 1618    Visit Number 19    Number of Visits 25    Date for PT Re-Evaluation 89/16/94   90 day cert, 60 day poc   Authorization Type [employer] medicare so 10th visit progress note, FOTO    PT Start Time 1616    PT Stop Time 1700    PT Time Calculation (min) 44 min    Equipment Utilized During Treatment Gait belt    Activity Tolerance Patient tolerated treatment well    Behavior During Therapy WFL for tasks assessed/performed           Past Medical History:  Diagnosis Date  . Actinic keratosis    Followed by Dr. Allyson Sabal  . Anxiety   . Diabetes mellitus, type II, insulin dependent ([place])    Associated with obesity: She takes Lantus, Victoza & Actos  . Essential hypertension   . GERD (gastroesophageal reflux disease)   . Hyperlipidemia   . Morbid obesity with BMI of 40.0-44.9, adult [facility])     Past Surgical History:  Procedure Laterality Date  . BREAST BIOPSY  1997  . LEFT HEART CATH AND CORONARY ANGIOGRAPHY N/A 01/05/2017   Procedure: LEFT HEART CATH AND CORONARY ANGIOGRAPHY;  Surgeon: Leonie Man, MD;  Location: [facility];  Service: Cardiovascular: Angiographically normal coronary arteries with a right dominant system.  Normal EF 55-65%.  Marland Kitchen LOOP RECORDER INSERTION N/A 08/01/2019   Procedure: LOOP RECORDER INSERTION;  Surgeon: Constance Haw, MD;  Location: [facility];  Service: Cardiovascular;  Laterality: N/A;    There were no vitals filed for this visit.   Subjective Assessment - 10/18/19 1923    Subjective Pt reports she is doing very well.    Patient is accompained  by: Family member   son, Kit   Pertinent History PMH: type 2 diabetes, uncontrolled with hyperglycemia currently not taking any treatment, hypertension, not taking any medication since at least Christmas time, morbid obesity, A-fib    Patient Stated Goals Pt wants to get better so she can be independent.    Currently in Pain? No/denies                             [facility] Adult PT Treatment/Exercise - 10/18/19 1635      Ambulation/Gait   Ambulation/Gait Yes    Ambulation/Gait Assistance 5: Supervision    Ambulation/Gait Assistance Details Around in clinic during session    Assistive device None    Gait Pattern Step-through pattern    Ambulation Surface Level;Indoor      Neuro Re-ed    Neuro Re-ed Details  Standing corner balance to progress home HEP: standing on pillows with feet together eyes open x 30 sec, eyes closed 30 sec x 2 with increased sway, feet together with head turns left/right and up/down x 10 each. Standing on floor in tandem 30 sec x 2 each position. Close SBA for safety. Sit to stand from edge of mat x 10 on blue foam beam. Dynamic gait in hallway: gait with head turns 35' x 2 side to side then 35' x 2  with up/down, side stepping with looking up diagonal each step to each side x 35'. Tandem gait x 35' CGA. Backwards gait with head turns side to side and up/down 35' each CGA. Pt had one LOB to right when looking right with backwards gait having to touch to wall. Denied any dizziness. In // bars on rockerboard positioned ant/post: maintaining level x 30 sec then eyes closed 30 sec x 2 with increased sway CGA for safety. Marching in place on rockerboard x 10 CGA.                  PT Education - 10/18/19 1930    Education Details Updated balance HEP.    Person(s) Educated Patient    Methods Explanation;Demonstration;Handout    Comprehension Verbalized understanding;Returned demonstration            PT Short Term Goals - 10/12/19 0834      PT  SHORT TERM GOAL #1   Title Pt will decrease 5 x sit to stand from 16 to <14 for improved functional strength and balance.    Baseline 16 sec on 10/11/19    Time 3    Period Weeks    Status New    Target Date 11/02/19      PT SHORT TERM GOAL #2   Title Pt will be able to reach overhead to perform activity x 1 min consistently with no reports of unsteadiness for improved safety.    Time 3    Period Weeks    Status New    Target Date 11/02/19             PT Long Term Goals - 10/12/19 0836      PT LONG TERM GOAL #1   Title Pt will be independent with progressive balance program for continued gains on own.    Time 6    Period Weeks    Status On-going    Target Date 11/23/19      PT LONG TERM GOAL #2   Title Pt will ambulate >500' on varied surfaces independently for improved community mobility.    Baseline supervision on nonlevel surfaces.    Time 6    Period Weeks    Status On-going    Target Date 11/23/19      PT LONG TERM GOAL #3   Title Pt will be able to ambulate up/down curb consistently independently with no reports of unsteadiness for improved community access.    Time 6    Period Weeks    Status New    Target Date 11/23/19      PT LONG TERM GOAL #4   Title Pt will increase FGA from 23/30 to >25/30 for improved balance and gait safety.    Baseline 7/30 on 08/16/19, 23/30 on FGA on 10/11/19    Time 6    Period Weeks    Status Revised    Target Date 11/23/19                 Plan - 10/18/19 1931    Clinical Impression Statement Session focused on updating balance HEP. Pt did well with progression. Continued to focus on balance with adding in more head turns and looking up with static and dynamic activities.    Personal Factors and Comorbidities Comorbidity 3+    Comorbidities DM2, HTN, morbid obesity, a-fib    Examination-Activity Limitations Stairs;Transfers;Locomotion Level    Examination-Participation Restrictions Community Activity;Cleaning     Stability/Clinical Decision Making Evolving/Moderate complexity  Rehab Potential Good    PT Frequency 2x / week   followed by 1x/week for 4 weeks.   PT Duration 2 weeks    PT Treatment/Interventions ADLs/Self Care Home Management;Aquatic Therapy;DME Instruction;Therapeutic activities;Functional mobility training;Stair training;Gait training;Therapeutic exercise;Balance training;Neuromuscular re-education;Vestibular;Patient/family education    PT Next Visit Plan 10th visit progress note next visit. Overhead activities for longer periods with balance.  Continue gait training on varied surfaces, balance exercise progression. Monitor HR.    Consulted and Agree with Plan of Care Patient;Family member/caregiver    Family Member Consulted son, Kit           Patient will benefit from skilled therapeutic intervention in order to improve the following deficits and impairments:  Abnormal gait, Decreased activity tolerance, Decreased balance, Decreased mobility, Decreased knowledge of use of DME  Visit Diagnosis: Other abnormalities of gait and mobility  Muscle weakness (generalized)     Problem List Patient Active Problem List   Diagnosis Date Noted  . Paroxysmal atrial fibrillation ([place]) 08/08/2019  . Secondary hypercoagulable state ([place]) 08/08/2019  . Acute cerebrovascular accident of cerebellum ([place]) 07/31/2019  . GERD (gastroesophageal reflux disease)   . Leukocytosis   . Morbid obesity ([place]) 01/25/2018  . Chest pain with low risk for cardiac etiology 12/31/2016  . Essential hypertension 12/31/2016  . Hyperlipidemia 12/31/2016  . DM (diabetes mellitus), type 2 with complications ([place]) 38/88/2800    Electa Sniff, PT, DPT, NCS 10/18/2019, 7:37 PM  [facility] [address] [place], [place], [address] Phone: [phone]   Fax:  [phone]  Name: [name] MRN: [id number] Date of Birth: [birth year]

## 2019-10-18 NOTE — Patient Instructions (Signed)
Access Code: [email or web address] URL: [email or web address] Date: 10/18/2019 Prepared by: Cherly Anderson  Exercises Sit to Stand - 2 x daily - 7 x weekly - 2 sets - 5 reps Walking March - 1 x daily - 5 x weekly - 1 sets - 4 reps Side Stepping with Counter Support - 1 x daily - 5 x weekly - 1 sets - 4 reps Standing on foam eyes open - 2 x daily - 5 x weekly - 1 sets - 3 reps - 30 sec hold Standing head turns - 2 x daily - 5 x weekly - 1 sets - 10 reps Romberg Stance with Head Nods on Foam Pad - 2 x daily - 5 x weekly - 1 sets - 10 reps Standing Balance with Eyes Closed on Foam - 2 x daily - 5 x weekly - 1 sets - 3 reps - 30 sec hold Tandem Stance - 2 x daily - 5 x weekly - 1 sets - 3 reps - 30 sec hold

## 2019-10-23 ENCOUNTER — Other Ambulatory Visit: Payer: Self-pay

## 2019-10-23 ENCOUNTER — Encounter: Payer: Self-pay | Admitting: Physical Therapy

## 2019-10-23 ENCOUNTER — Ambulatory Visit: Payer: Medicare Other | Admitting: Physical Therapy

## 2019-10-23 DIAGNOSIS — M6281 Muscle weakness (generalized): Secondary | ICD-10-CM

## 2019-10-23 DIAGNOSIS — R2681 Unsteadiness on feet: Secondary | ICD-10-CM

## 2019-10-23 DIAGNOSIS — R2689 Other abnormalities of gait and mobility: Secondary | ICD-10-CM

## 2019-10-23 NOTE — Therapy (Signed)
[facility] [address] [place], [place], [address] Phone: [phone]   Fax:  [phone]  Physical Therapy Treatment/10th Visit Progress Note  Patient Details  Name: [name] MRN: [id number] Date of Birth: 09-Aug-1945 Referring Provider (PT): Frann Rider will be following  10th Visit Physical Therapy Progress Note  Dates of Reporting Period: 09/20/19 to 10/23/19   Encounter Date: 10/23/2019   PT End of Session - 10/23/19 1656    Visit Number 20    Number of Visits 25    Date for PT Re-Evaluation 84/16/60   90 day cert, 60 day poc   Authorization Type [employer] medicare so 10th visit progress note, FOTO    PT Start Time 1615    PT Stop Time 1655    PT Time Calculation (min) 40 min    Equipment Utilized During Treatment Gait belt    Activity Tolerance Patient tolerated treatment well    Behavior During Therapy WFL for tasks assessed/performed           Past Medical History:  Diagnosis Date  . Actinic keratosis    Followed by Dr. Allyson Sabal  . Anxiety   . Diabetes mellitus, type II, insulin dependent ([place])    Associated with obesity: She takes Lantus, Victoza & Actos  . Essential hypertension   . GERD (gastroesophageal reflux disease)   . Hyperlipidemia   . Morbid obesity with BMI of 40.0-44.9, adult [facility])     Past Surgical History:  Procedure Laterality Date  . BREAST BIOPSY  1997  . LEFT HEART CATH AND CORONARY ANGIOGRAPHY N/A 01/05/2017   Procedure: LEFT HEART CATH AND CORONARY ANGIOGRAPHY;  Surgeon: Leonie Man, MD;  Location: [facility];  Service: Cardiovascular: Angiographically normal coronary arteries with a right dominant system.  Normal EF 55-65%.  Marland Kitchen LOOP RECORDER INSERTION N/A 08/01/2019   Procedure: LOOP RECORDER INSERTION;  Surgeon: Constance Haw, MD;  Location: [facility];  Service: Cardiovascular;  Laterality: N/A;    There were no vitals filed for this  visit.   Subjective Assessment - 10/23/19 1617    Subjective No changes, no falls. New balance exercises are going well. Getting up on the curb has been getting easier.    Patient is accompained by: Family member   son, Kit   Pertinent History PMH: type 2 diabetes, uncontrolled with hyperglycemia currently not taking any treatment, hypertension, not taking any medication since at least Christmas time, morbid obesity, A-fib    Patient Stated Goals Pt wants to get better so she can be independent.    Currently in Pain? No/denies                                  Balance Exercises - 10/23/19 1634      Balance Exercises: Standing   Standing Eyes Closed Wide (BOA);Foam/compliant surface;3 reps;Limitations    Standing Eyes Closed Limitations on blue foam beam, 2 x 20 seconds, and then 1 rep of 30 seconds with eyes closed    SLS Eyes open;Foam/compliant surface;Intermittent upper extremity support;Limitations    SLS Limitations standing on blue foam beam, alternating taps to 2 cones x10 reps B, beginning with single UE support and then progressing to none with min guard for balance    SLS with Vectors Intermittent upper extremity assist;Foam/compliant surface;Limitations    SLS with Vectors Limitations on blue compliant mat, x10 reps alternating stepping over  2" black foam beam for SLS and foot clearance, cues for incr hip/knee flexion, pt with lean to R at times when performing, intermittent UE support    Wall Bumps Hip;Eyes opened;10 reps;Limitations    Wall Bumps Limitations standing on rockerboard, performing against wall, cues for proper technique and weight shift x10 reps    Stepping Strategy Anterior;Posterior;Foam/compliant surface;10 reps;Limitations    Stepping Strategy Limitations on blue foam beam, performing both x10 reps with alternating steps, initial min guard    Rockerboard Anterior/posterior;EO;Limitations    Rockerboard Limitations in corner on rockerboard,  performing 2 x 10 reps head nods, first with fingertip support and then progressing to none. then standing on rockerboard multi-directional tapping to cones held superiorly for looking up and reaching outside of BOS x10 reps    Other Standing Exercises On blue/red compliant mats next to countertop: tandem walking down and back x2 reps with intermittent UE support, forward gait with head turns down and back x2 reps with intermittent UE support followed by forward gait with head nods down and back x2 reps, walking marching with focus on slowed and controlled for SLS down and back x2 reps and then attempted walking marching with head turns down and back x1 rep, pt with difficulty coordinating movement and decr clearance of BLE.                PT Short Term Goals - 10/12/19 0834      PT SHORT TERM GOAL #1   Title Pt will decrease 5 x sit to stand from 16 to <14 for improved functional strength and balance.    Baseline 16 sec on 10/11/19    Time 3    Period Weeks    Status New    Target Date 11/02/19      PT SHORT TERM GOAL #2   Title Pt will be able to reach overhead to perform activity x 1 min consistently with no reports of unsteadiness for improved safety.    Time 3    Period Weeks    Status New    Target Date 11/02/19             PT Long Term Goals - 10/12/19 0836      PT LONG TERM GOAL #1   Title Pt will be independent with progressive balance program for continued gains on own.    Time 6    Period Weeks    Status On-going    Target Date 11/23/19      PT LONG TERM GOAL #2   Title Pt will ambulate >500' on varied surfaces independently for improved community mobility.    Baseline supervision on nonlevel surfaces.    Time 6    Period Weeks    Status On-going    Target Date 11/23/19      PT LONG TERM GOAL #3   Title Pt will be able to ambulate up/down curb consistently independently with no reports of unsteadiness for improved community access.    Time 6    Period  Weeks    Status New    Target Date 11/23/19      PT LONG TERM GOAL #4   Title Pt will increase FGA from 23/30 to >25/30 for improved balance and gait safety.    Baseline 7/30 on 08/16/19, 23/30 on FGA on 10/11/19    Time 6    Period Weeks    Status Revised    Target Date 11/23/19  Plan - 10/23/19 1701    Clinical Impression Statement 10th visit progress note: Focus of today's skilled session was balance strategies on compliant surfaces with decr UE support and vertical head nods/looking up. Pt improving with incr reps when performing head nods on rockerboard and able to utilize hip/ankle strategies to maintain balance. No dizziness reported throughout session. Pt needing min guard/min A for balance on compliant surfaces with incr difficulty at times with SLS. Will continue to progress towards LTGs.    Personal Factors and Comorbidities Comorbidity 3+    Comorbidities DM2, HTN, morbid obesity, a-fib    Examination-Activity Limitations Stairs;Transfers;Locomotion Level    Examination-Participation Restrictions Community Activity;Cleaning    Stability/Clinical Decision Making Evolving/Moderate complexity    Rehab Potential Good    PT Frequency 2x / week   followed by 1x/week for 4 weeks.   PT Duration 2 weeks    PT Treatment/Interventions ADLs/Self Care Home Management;Aquatic Therapy;DME Instruction;Therapeutic activities;Functional mobility training;Stair training;Gait training;Therapeutic exercise;Balance training;Neuromuscular re-education;Vestibular;Patient/family education    PT Next Visit Plan Overhead activities for longer periods with balance.  Continue gait training on varied surfaces, balance exercise progression. Monitor HR.    Consulted and Agree with Plan of Care Patient;Family member/caregiver    Family Member Consulted son, Kit           Patient will benefit from skilled therapeutic intervention in order to improve the following deficits and  impairments:  Abnormal gait, Decreased activity tolerance, Decreased balance, Decreased mobility, Decreased knowledge of use of DME  Visit Diagnosis: Other abnormalities of gait and mobility  Unsteadiness on feet  Muscle weakness (generalized)     Problem List Patient Active Problem List   Diagnosis Date Noted  . Paroxysmal atrial fibrillation ([place]) 08/08/2019  . Secondary hypercoagulable state ([place]) 08/08/2019  . Acute cerebrovascular accident of cerebellum ([place]) 07/31/2019  . GERD (gastroesophageal reflux disease)   . Leukocytosis   . Morbid obesity ([place]) 01/25/2018  . Chest pain with low risk for cardiac etiology 12/31/2016  . Essential hypertension 12/31/2016  . Hyperlipidemia 12/31/2016  . DM (diabetes mellitus), type 2 with complications ([place]) 03/83/3383    Arliss Journey, PT, DPT  10/23/2019, 5:04 PM  [facility] [address] [place], [place], [address] Phone: [phone]   Fax:  [phone]  Name: [name] MRN: [id number] Date of Birth: [birth year]

## 2019-10-30 ENCOUNTER — Telehealth: Payer: Self-pay

## 2019-10-30 ENCOUNTER — Telehealth: Payer: Self-pay | Admitting: Adult Health

## 2019-10-30 ENCOUNTER — Ambulatory Visit: Payer: Medicare Other | Attending: Internal Medicine

## 2019-10-30 ENCOUNTER — Other Ambulatory Visit: Payer: Self-pay

## 2019-10-30 VITALS — BP 168/80 | HR 72

## 2019-10-30 DIAGNOSIS — R2681 Unsteadiness on feet: Secondary | ICD-10-CM | POA: Diagnosis present

## 2019-10-30 DIAGNOSIS — M6281 Muscle weakness (generalized): Secondary | ICD-10-CM | POA: Insufficient documentation

## 2019-10-30 DIAGNOSIS — R2689 Other abnormalities of gait and mobility: Secondary | ICD-10-CM

## 2019-10-30 NOTE — Therapy (Signed)
[facility] [address] [place], [place], [address] Phone: [phone]   Fax:  [phone]  Physical Therapy Treatment  Patient Details  Name: [name] MRN: [id number] Date of Birth: 05/22/1945 Referring Provider (PT): Frann Rider will be following   Encounter Date: 10/30/2019   PT End of Session - 10/30/19 1624    Visit Number 21    Number of Visits 25    Date for PT Re-Evaluation 67/20/94   90 day cert, 60 day poc   Authorization Type [employer] medicare so 10th visit progress note, FOTO    PT Start Time 1622    PT Stop Time 1701    PT Time Calculation (min) 39 min    Equipment Utilized During Treatment Gait belt    Activity Tolerance Patient tolerated treatment well    Behavior During Therapy WFL for tasks assessed/performed           Past Medical History:  Diagnosis Date  . Actinic keratosis    Followed by Dr. Allyson Sabal  . Anxiety   . Diabetes mellitus, type II, insulin dependent ([place])    Associated with obesity: She takes Lantus, Victoza & Actos  . Essential hypertension   . GERD (gastroesophageal reflux disease)   . Hyperlipidemia   . Morbid obesity with BMI of 40.0-44.9, adult [facility])     Past Surgical History:  Procedure Laterality Date  . BREAST BIOPSY  1997  . LEFT HEART CATH AND CORONARY ANGIOGRAPHY N/A 01/05/2017   Procedure: LEFT HEART CATH AND CORONARY ANGIOGRAPHY;  Surgeon: Leonie Man, MD;  Location: [place] [facility];  Service: Cardiovascular: Angiographically normal coronary arteries with a right dominant system.  Normal EF 55-65%.  Marland Kitchen LOOP RECORDER INSERTION N/A 08/01/2019   Procedure: LOOP RECORDER INSERTION;  Surgeon: Constance Haw, MD;  Location: [place] [facility];  Service: Cardiovascular;  Laterality: N/A;    Vitals:   10/30/19 1630  BP: (!) 168/80  Pulse: 72  SpO2: 97%     Subjective Assessment - 10/30/19 1625    Subjective Pt reports that she had 3 dizzy  episodes this past weekend. One when was sitting in car and just moved foot from one leg to the other, one in store and suddenly started to go backwards and grabbed cart and table and daughter had to catch her, one when sitting at restaurant with sister and reached up to get pizza off pedestal and occurred again. It came and went quickly. She did see her PCP today and everything stepped out good but referred her to neurologist who mentioned possibly seeing cardiologist.    Patient is accompained by: Family member   son, Kit   Pertinent History PMH: type 2 diabetes, uncontrolled with hyperglycemia currently not taking any treatment, hypertension, not taking any medication since at least Christmas time, morbid obesity, A-fib    Patient Stated Goals Pt wants to get better so she can be independent.    Currently in Pain? No/denies                             [facility] Adult PT Treatment/Exercise - 10/30/19 1636      Ambulation/Gait   Ambulation/Gait Yes    Ambulation/Gait Assistance 5: Supervision    Ambulation/Gait Assistance Details Pt denied any dizziness with gait.    Ambulation Distance (Feet) 345 Feet    Assistive device None    Gait Pattern Step-through pattern  Ambulation Surface Level;Indoor      Neuro Re-ed    Neuro Re-ed Details  Step-ups on foam beam getting balance each time without UE support x 10. Standing on rockerboard positioned ant/posterior maintaining level x 30 sec then rocking board ant/post x 10 CGA with difficulty shifting anterior, then playing catch with son with PT guarding x 2 min, then turned board lateral and continued playing catch x 2 min then holding ball in front and trunk rotation to each side x 10 CGA.BP=160/80 after.                  PT Education - 10/30/19 2156    Education Details Advised pt to follow-up with cardiologist about transient dizzy episodes as do not seem to be related to movement. Let them know PT would send a message  as well.    Person(s) Educated Patient;Child(ren)    Methods Explanation    Comprehension Verbalized understanding            PT Short Term Goals - 10/12/19 0834      PT SHORT TERM GOAL #1   Title Pt will decrease 5 x sit to stand from 16 to <14 for improved functional strength and balance.    Baseline 16 sec on 10/11/19    Time 3    Period Weeks    Status New    Target Date 11/02/19      PT SHORT TERM GOAL #2   Title Pt will be able to reach overhead to perform activity x 1 min consistently with no reports of unsteadiness for improved safety.    Time 3    Period Weeks    Status New    Target Date 11/02/19             PT Long Term Goals - 10/12/19 0836      PT LONG TERM GOAL #1   Title Pt will be independent with progressive balance program for continued gains on own.    Time 6    Period Weeks    Status On-going    Target Date 11/23/19      PT LONG TERM GOAL #2   Title Pt will ambulate >500' on varied surfaces independently for improved community mobility.    Baseline supervision on nonlevel surfaces.    Time 6    Period Weeks    Status On-going    Target Date 11/23/19      PT LONG TERM GOAL #3   Title Pt will be able to ambulate up/down curb consistently independently with no reports of unsteadiness for improved community access.    Time 6    Period Weeks    Status New    Target Date 11/23/19      PT LONG TERM GOAL #4   Title Pt will increase FGA from 23/30 to >25/30 for improved balance and gait safety.    Baseline 7/30 on 08/16/19, 23/30 on FGA on 10/11/19    Time 6    Period Weeks    Status Revised    Target Date 11/23/19                 Plan - 10/30/19 2158    Clinical Impression Statement Pt reporting transient dizzy episodes not related to movement with 3 occurrences over the weekend. Advised to follow up with cardiologist as neurologist also recommended. No issues during session today. BP was slightly elevated from what it has been. Pt  continues to show improvements  in balance reactions.    Personal Factors and Comorbidities Comorbidity 3+    Comorbidities DM2, HTN, morbid obesity, a-fib    Examination-Activity Limitations Stairs;Transfers;Locomotion Level    Examination-Participation Restrictions Community Activity;Cleaning    Stability/Clinical Decision Making Evolving/Moderate complexity    Rehab Potential Good    PT Frequency 2x / week   followed by 1x/week for 4 weeks.   PT Duration 2 weeks    PT Treatment/Interventions ADLs/Self Care Home Management;Aquatic Therapy;DME Instruction;Therapeutic activities;Functional mobility training;Stair training;Gait training;Therapeutic exercise;Balance training;Neuromuscular re-education;Vestibular;Patient/family education    PT Next Visit Plan What did cardiologist say? Overhead activities for longer periods with balance.  Continue gait training on varied surfaces, balance exercise progression. Monitor HR.    Consulted and Agree with Plan of Care Patient;Family member/caregiver    Family Member Consulted son, Kit           Patient will benefit from skilled therapeutic intervention in order to improve the following deficits and impairments:  Abnormal gait, Decreased activity tolerance, Decreased balance, Decreased mobility, Decreased knowledge of use of DME  Visit Diagnosis: Other abnormalities of gait and mobility  Unsteadiness on feet  Muscle weakness (generalized)     Problem List Patient Active Problem List   Diagnosis Date Noted  . Paroxysmal atrial fibrillation ([place]) 08/08/2019  . Secondary hypercoagulable state ([place]) 08/08/2019  . Acute cerebrovascular accident of cerebellum ([place]) 07/31/2019  . GERD (gastroesophageal reflux disease)   . Leukocytosis   . Morbid obesity ([place]) 01/25/2018  . Chest pain with low risk for cardiac etiology 12/31/2016  . Essential hypertension 12/31/2016  . Hyperlipidemia 12/31/2016  . DM (diabetes mellitus), type 2 with  complications ([place]) 44/81/8563    Electa Sniff, PT, DPT, NCS 10/30/2019, 10:01 PM  [facility] [address] [place], [place], [address] Phone: [phone]   Fax:  [phone]  Name: Ruhi Kopke MRN: [id number] Date of Birth: 1945/05/12

## 2019-10-30 NOTE — Telephone Encounter (Signed)
Dizziness present prior to her stroke and advised her to speak further with cardiology as this may be atrial fibrillation or blood pressure related.

## 2019-10-30 NOTE — Telephone Encounter (Signed)
I called son back and relayed the message to son, about checking with cardiology and (afib/Bp).  Bp taken 2-3 times daily is good and has loop monitor for afib episodes.  No call received.  They will check with cardiology and if all ok will let us know and what next.

## 2019-10-30 NOTE — Telephone Encounter (Signed)
I called son, pt had 3 spells of dizziness over Fri, Sat and Monday. (2 sitting, one standing.  Last 1-2 minutes.  No nausea.  Saw Dr. Hollie Salk at [facility] this am.  ? Stroke.  She was fine after the episodes.  No other symptoms noted.   pcp said to check with neuro.  She is on eliquis.  She has PT  this afternoon at 1615 neuro rehab fore dizziness, he stated he would pop in to see if could ask you few questions.  I relayed that you are seeing pts and would not be able to speak to him.  (understood if cannot).

## 2019-10-30 NOTE — Telephone Encounter (Signed)
Pt's Kipp Codfelter want to inform the nurse, saw Dr. Larita Fife at [facility]. Recommended to let [place] know physician did a neurological check and everything was great. Mr. Reesa Chew would like a call from the nurse.

## 2019-10-30 NOTE — Telephone Encounter (Signed)
Malka So, PA, I am working with [employer] in PT. She has been progressing well with balance and gait. Today upon arrival she reported 3 transient dizzy spells over the weekend that came and went fairly quickly but were not movement related. She was just sitting for 2 of them. I'm a bit concerned that they could be more cardiac related. Wanted to see if this is something that would show up on loop recorder. She did see her PCP who recommended she follow-up with neurologist who also thought may be more cardiac related. Just wanted to let you know. Son will be calling tomorrow. Thanks. Cherly Anderson, PT, DPT, NCS

## 2019-10-31 NOTE — Telephone Encounter (Addendum)
LINQ reviewed by Adline Peals PA no afib on device since 7/28 which lasted 14 mins. Afib burden less than 1%. Heart rhythm does not correlate with dizzy spells. This morning her BP was 132/53 and at her PCP visit yesterday her BP was well controlled as well.  Per Audry Pili forward to Dr. Ellyn Hack for further advisement and pt is going to let neurology know the dizziness did not correlate with afib episodes.  Pt verbalized understanding.

## 2019-11-01 NOTE — Telephone Encounter (Signed)
Agree with Ula Lingo, MD

## 2019-11-06 ENCOUNTER — Ambulatory Visit: Payer: Medicare Other

## 2019-11-06 ENCOUNTER — Other Ambulatory Visit: Payer: Self-pay

## 2019-11-06 DIAGNOSIS — M6281 Muscle weakness (generalized): Secondary | ICD-10-CM

## 2019-11-06 DIAGNOSIS — R2689 Other abnormalities of gait and mobility: Secondary | ICD-10-CM

## 2019-11-06 DIAGNOSIS — R2681 Unsteadiness on feet: Secondary | ICD-10-CM

## 2019-11-06 NOTE — Therapy (Signed)
[facility] [address] [place], [place], [address] Phone: [phone]   Fax:  [phone]  Physical Therapy Treatment  Patient Details  Name: [name] MRN: [id number] Date of Birth: 06/23/1945 Referring Provider (PT): Frann Rider will be following   Encounter Date: 11/06/2019   PT End of Session - 11/06/19 1618    Visit Number 22    Number of Visits 25    Date for PT Re-Evaluation 34/19/37   90 day cert, 60 day poc   Authorization Type [employer] medicare so 10th visit progress note, FOTO    PT Start Time 1615    PT Stop Time 1657    PT Time Calculation (min) 42 min    Equipment Utilized During Treatment Gait belt    Activity Tolerance Patient tolerated treatment well    Behavior During Therapy WFL for tasks assessed/performed           Past Medical History:  Diagnosis Date  . Actinic keratosis    Followed by Dr. Allyson Sabal  . Anxiety   . Diabetes mellitus, type II, insulin dependent ([place])    Associated with obesity: She takes Lantus, Victoza & Actos  . Essential hypertension   . GERD (gastroesophageal reflux disease)   . Hyperlipidemia   . Morbid obesity with BMI of 40.0-44.9, adult [facility])     Past Surgical History:  Procedure Laterality Date  . BREAST BIOPSY  1997  . LEFT HEART CATH AND CORONARY ANGIOGRAPHY N/A 01/05/2017   Procedure: LEFT HEART CATH AND CORONARY ANGIOGRAPHY;  Surgeon: Leonie Man, MD;  Location: [place] [facility];  Service: Cardiovascular: Angiographically normal coronary arteries with a right dominant system.  Normal EF 55-65%.  Marland Kitchen LOOP RECORDER INSERTION N/A 08/01/2019   Procedure: LOOP RECORDER INSERTION;  Surgeon: Constance Haw, MD;  Location: [place] [facility];  Service: Cardiovascular;  Laterality: N/A;    There were no vitals filed for this visit.   Subjective Assessment - 11/06/19 1619    Subjective Pt reports that she has been doing better. She has not used  walker at all and not had any more of those dizzy spells. Did not hear anything further back from heart doctor. Pt reports that she has been reaching up to get things but tries not to look up.    Patient is accompained by: Family member   son, Kit   Pertinent History PMH: type 2 diabetes, uncontrolled with hyperglycemia currently not taking any treatment, hypertension, not taking any medication since at least Christmas time, morbid obesity, A-fib    Patient Stated Goals Pt wants to get better so she can be independent.    Currently in Pain? No/denies                             [facility] Adult PT Treatment/Exercise - 11/06/19 1621      Transfers   Five time sit to stand comments  14 sec from mat without hands      Ambulation/Gait   Ambulation/Gait Yes      Neuro Re-ed    Neuro Re-ed Details  Standing at counter reaching overhead in to cabinets placing 4 cones up and down with each arm x 1 min straight. In // bars: step-ups on blue foam beam x 10 each leg getting balance each time. Standing on blue mat with tapping 4 different colored cones on command x 5 each then alternating feet CGA with  verbal cues to squeeze bottom to help control. Gait around gym with dynamic gait activities: marching x 100', head turns left/right and up/down on command, large steps. Dynamic gait activities in hallway: gait with 180 degree turns to switch direction from walking forwards to walking backwards, gait holding 1.1# ball overhead  35' x 2, gait moving ball in diagonal pattern following with eyes 35' x 1, side stepping holding ball in front 35' x 2. HR=90 after activities: Sit to stand on blue foam beam without hands x 5 then with adding in holding 1.1# ball in front and raising overhead once upright then back down x 10.  Close SBA/CGA with activities. Pt denied any dizziness.                  PT Education - 11/06/19 1707    Education Details Pt to continue with current HEP    Person(s)  Educated Patient    Methods Explanation    Comprehension Verbalized understanding            PT Short Term Goals - 11/06/19 1707      PT SHORT TERM GOAL #1   Title Pt will decrease 5 x sit to stand from 16 to <14 for improved functional strength and balance.    Baseline 16 sec on 10/11/19, 11/06/19 14 sec    Time 3    Period Weeks    Status Partially Met    Target Date 11/02/19      PT SHORT TERM GOAL #2   Title Pt will be able to reach overhead to perform activity x 1 min consistently with no reports of unsteadiness for improved safety.    Baseline pt able to perform overhead activity x 1 min with no dizziness. Denies any issues last couple weeks.    Time 3    Period Weeks    Status Achieved    Target Date 11/02/19             PT Long Term Goals - 10/12/19 0836      PT LONG TERM GOAL #1   Title Pt will be independent with progressive balance program for continued gains on own.    Time 6    Period Weeks    Status On-going    Target Date 11/23/19      PT LONG TERM GOAL #2   Title Pt will ambulate >500' on varied surfaces independently for improved community mobility.    Baseline supervision on nonlevel surfaces.    Time 6    Period Weeks    Status On-going    Target Date 11/23/19      PT LONG TERM GOAL #3   Title Pt will be able to ambulate up/down curb consistently independently with no reports of unsteadiness for improved community access.    Time 6    Period Weeks    Status New    Target Date 11/23/19      PT LONG TERM GOAL #4   Title Pt will increase FGA from 23/30 to >25/30 for improved balance and gait safety.    Baseline 7/30 on 08/16/19, 23/30 on FGA on 10/11/19    Time 6    Period Weeks    Status Revised    Target Date 11/23/19                 Plan - 11/06/19 1708    Clinical Impression Statement PT reassessed STGs today. Pt just short of 5 x sit to  stand with 14 sec and goal was <14 sec. Pt met standing with reaching over head goal. Had no  reports of dizziness during session today. Pt continues to show increased stamina not requesting any breaks during session.    Personal Factors and Comorbidities Comorbidity 3+    Comorbidities DM2, HTN, morbid obesity, a-fib    Examination-Activity Limitations Stairs;Transfers;Locomotion Level    Examination-Participation Restrictions Community Activity;Cleaning    Stability/Clinical Decision Making Evolving/Moderate complexity    Rehab Potential Good    PT Frequency 2x / week   followed by 1x/week for 4 weeks.   PT Duration 2 weeks    PT Treatment/Interventions ADLs/Self Care Home Management;Aquatic Therapy;DME Instruction;Therapeutic activities;Functional mobility training;Stair training;Gait training;Therapeutic exercise;Balance training;Neuromuscular re-education;Vestibular;Patient/family education    PT Next Visit Plan Overhead activities for longer periods with balance.  Continue gait training on varied surfaces, balance exercise progression. Monitor HR.    Consulted and Agree with Plan of Care Patient;Family member/caregiver    Family Member Consulted son, Kit           Patient will benefit from skilled therapeutic intervention in order to improve the following deficits and impairments:  Abnormal gait, Decreased activity tolerance, Decreased balance, Decreased mobility, Decreased knowledge of use of DME  Visit Diagnosis: Other abnormalities of gait and mobility  Muscle weakness (generalized)  Unsteadiness on feet     Problem List Patient Active Problem List   Diagnosis Date Noted  . Paroxysmal atrial fibrillation ([facility]) 08/08/2019  . Secondary hypercoagulable state ([facility]) 08/08/2019  . Acute cerebrovascular accident of cerebellum ([facility]) 07/31/2019  . GERD (gastroesophageal reflux disease)   . Leukocytosis   . Morbid obesity ([facility]) 01/25/2018  . Chest pain with low risk for cardiac etiology 12/31/2016  . Essential hypertension 12/31/2016  . Hyperlipidemia 12/31/2016  . DM  (diabetes mellitus), type 2 with complications ([facility]) 12/31/2016     A , PT, DPT, NCS 11/06/2019, 5:11 PM  [facility] [address] [place], [place], [address] Phone: [phone]   Fax:  [phone]  Name: Kaprice Armentor MRN: [id number] Date of Birth: 09/23/1945   

## 2019-11-11 LAB — CUP PACEART REMOTE DEVICE CHECK
Date Time Interrogation Session: 20210813230649
Implantable Pulse Generator Implant Date: 20210505

## 2019-11-12 ENCOUNTER — Ambulatory Visit (INDEPENDENT_AMBULATORY_CARE_PROVIDER_SITE_OTHER): Payer: Medicare Other | Admitting: *Deleted

## 2019-11-12 DIAGNOSIS — I480 Paroxysmal atrial fibrillation: Secondary | ICD-10-CM | POA: Diagnosis not present

## 2019-11-13 ENCOUNTER — Ambulatory Visit: Payer: Medicare Other

## 2019-11-13 ENCOUNTER — Other Ambulatory Visit: Payer: Self-pay

## 2019-11-13 DIAGNOSIS — M6281 Muscle weakness (generalized): Secondary | ICD-10-CM

## 2019-11-13 DIAGNOSIS — R2689 Other abnormalities of gait and mobility: Secondary | ICD-10-CM

## 2019-11-13 NOTE — Therapy (Signed)
[facility] [address] [place], [place], [address] Phone: [phone]   Fax:  [phone]  Physical Therapy Treatment/Discharge Summary  Patient Details  Name: [name] MRN: [id number] Date of Birth: [birth year] Referring Provider (PT): Frann Rider will be following  PHYSICAL THERAPY DISCHARGE SUMMARY  Visits from Start of Care: 23  Current functional level related to goals / functional outcomes: See clinical impression and goals for more information. Pt is ambulating independently without AD at this time. Low fall risk based on FGA and 5 x sit to stand score.    Remaining deficits: none   Education / Equipment: HEP  Plan: Patient agrees to discharge.  Patient goals were met. Patient is being discharged due to meeting the stated rehab goals.  ?????       Encounter Date: 11/13/2019   PT End of Session - 11/13/19 1614    Visit Number 23    Number of Visits 25    Date for PT Re-Evaluation 35/59/74   90 day cert, 60 day poc   Authorization Type [employer] medicare so 10th visit progress note, FOTO    PT Start Time 1614    PT Stop Time 1657    PT Time Calculation (min) 43 min    Equipment Utilized During Treatment Gait belt    Activity Tolerance Patient tolerated treatment well    Behavior During Therapy WFL for tasks assessed/performed           Past Medical History:  Diagnosis Date  . Actinic keratosis    Followed by Dr. Allyson Sabal  . Anxiety   . Diabetes mellitus, type II, insulin dependent ([place])    Associated with obesity: She takes Lantus, Victoza & Actos  . Essential hypertension   . GERD (gastroesophageal reflux disease)   . Hyperlipidemia   . Morbid obesity with BMI of 40.0-44.9, adult [facility])     Past Surgical History:  Procedure Laterality Date  . BREAST BIOPSY  1997  . LEFT HEART CATH AND CORONARY ANGIOGRAPHY N/A 01/05/2017   Procedure: LEFT HEART CATH AND CORONARY ANGIOGRAPHY;   Surgeon: Leonie Man, MD;  Location: [facility];  Service: Cardiovascular: Angiographically normal coronary arteries with a right dominant system.  Normal EF 55-65%.  Marland Kitchen LOOP RECORDER INSERTION N/A 08/01/2019   Procedure: LOOP RECORDER INSERTION;  Surgeon: Constance Haw, MD;  Location: [facility];  Service: Cardiovascular;  Laterality: N/A;    There were no vitals filed for this visit.   Subjective Assessment - 11/13/19 1615    Subjective Pt reports she has been doing great. Drove here today with her son.    Patient is accompained by: Family member   son, Kit   Pertinent History PMH: type 2 diabetes, uncontrolled with hyperglycemia currently not taking any treatment, hypertension, not taking any medication since at least Christmas time, morbid obesity, A-fib    Patient Stated Goals Pt wants to get better so she can be independent.    Currently in Pain? No/denies              [facility] PT Assessment - 11/13/19 1616      Functional Gait  Assessment   Gait assessed  Yes    Gait Level Surface Walks 20 ft in less than 5.5 sec, no assistive devices, good speed, no evidence for imbalance, normal gait pattern, deviates no more than 6 in outside of the 12 in walkway width.    Change in Gait Speed Able  to change speed, demonstrates mild gait deviations, deviates 6-10 in outside of the 12 in walkway width, or no gait deviations, unable to achieve a major change in velocity, or uses a change in velocity, or uses an assistive device.    Gait with Horizontal Head Turns Performs head turns smoothly with no change in gait. Deviates no more than 6 in outside 12 in walkway width    Gait with Vertical Head Turns Performs head turns with no change in gait. Deviates no more than 6 in outside 12 in walkway width.    Gait and Pivot Turn Pivot turns safely within 3 sec and stops quickly with no loss of balance.    Step Over Obstacle Is able to step over 2 stacked shoe boxes taped together (9  in total height) without changing gait speed. No evidence of imbalance.    Gait with Narrow Base of Support Is able to ambulate for 10 steps heel to toe with no staggering.    Gait with Eyes Closed Walks 20 ft, uses assistive device, slower speed, mild gait deviations, deviates 6-10 in outside 12 in walkway width. Ambulates 20 ft in less than 9 sec but greater than 7 sec.    Ambulating Backwards Walks 20 ft, no assistive devices, good speed, no evidence for imbalance, normal gait    Steps Alternating feet, must use rail.    Total Score 27                         [facility] Adult PT Treatment/Exercise - 11/13/19 1616      Transfers   Transfers Sit to Stand;Stand to Sit    Sit to Stand 7: Independent    Five time sit to stand comments  12 sec from mat without hands    Stand to Sit 7: Independent      Ambulation/Gait   Ambulation/Gait Yes    Ambulation/Gait Assistance 7: Independent    Ambulation Distance (Feet) 800 Feet    Assistive device None    Gait Pattern Step-through pattern    Ambulation Surface Level;Unlevel;Outdoor;Indoor;Paved    Curb 7: Independent    Curb Details (indicate cue type and reason) performed large curb inside x 2 and then normal curb outside x 4.    Gait Comments Pt had one episode of catching right shoe on floor when performing FGA with speed changes  tripping herself but able to step quick and catch herself CGA.      Neuro Re-ed    Neuro Re-ed Details  PT reviewed standing balance HEP in corner with patient: standing on pillow feet apart eyes closed x 30 sec, feet together with head turns up/down x 10 and left/right x 10. Tandem stance x 30 sec each position. Tandem gait along counter 6' x 2 supervision. Pt was cued to tough lightly if no one next to her when performing at home. Discussed using red solo cup to perform alternating toe taps next to counter at home.                  PT Education - 11/13/19 1705    Education Details Added tandem  gait along counter and alternating toe taps to HEP. Discussed d/c 1 visit early as pt has met all goals.    Person(s) Educated Patient;Child(ren)    Methods Explanation;Demonstration;Handout    Comprehension Verbalized understanding;Returned demonstration            PT Short Term Goals - 11/13/19  1640      PT SHORT TERM GOAL #1   Title Pt will decrease 5 x sit to stand from 16 to <14 for improved functional strength and balance.    Baseline 16 sec on 10/11/19, 11/06/19 14 sec, 11/13/19 5 x sit to stand=12 sec    Time 3    Period Weeks    Status Achieved    Target Date 11/02/19      PT SHORT TERM GOAL #2   Title Pt will be able to reach overhead to perform activity x 1 min consistently with no reports of unsteadiness for improved safety.    Baseline pt able to perform overhead activity x 1 min with no dizziness. Denies any issues last couple weeks.    Time 3    Period Weeks    Status Achieved    Target Date 11/02/19             PT Long Term Goals - 11/13/19 1628      PT LONG TERM GOAL #1   Title Pt will be independent with progressive balance program for continued gains on own.    Baseline Pt is independent with HEP. Denies any further questions    Time 6    Period Weeks    Status Achieved      PT LONG TERM GOAL #2   Title Pt will ambulate >500' on varied surfaces independently for improved community mobility.    Baseline Independently outside on paved surfaces >800'    Time 6    Period Weeks    Status Achieved      PT LONG TERM GOAL #3   Title Pt will be able to ambulate up/down curb consistently independently with no reports of unsteadiness for improved community access.    Baseline Pt able to ambulate up/down curb consistently today.    Time 6    Period Weeks    Status Achieved      PT LONG TERM GOAL #4   Title Pt will increase FGA from 23/30 to >25/30 for improved balance and gait safety.    Baseline 7/30 on 08/16/19, 23/30 on FGA on 10/11/19, 11/13/19 27/30     Time 6    Period Weeks    Status Achieved                 Plan - 11/13/19 1706    Clinical Impression Statement PT reassessed LTGs early today as patient progressing faster than anticipated. Pt denies any balance issues the last 2 weeks and feels she is back to her old self prior to stroke. Pt met FGA goal showing low fall risk with score of 27/30. She is able to ambulate on varied surfaces independently including curb negotiation. Pt has met all goals at this time. Will continue with HEP on own.    Personal Factors and Comorbidities Comorbidity 3+    Comorbidities DM2, HTN, morbid obesity, a-fib    Examination-Activity Limitations Stairs;Transfers;Locomotion Level    Examination-Participation Restrictions Community Activity;Cleaning    Stability/Clinical Decision Making Evolving/Moderate complexity    Rehab Potential Good    PT Frequency 2x / week   followed by 1x/week for 4 weeks.   PT Duration 2 weeks    PT Treatment/Interventions ADLs/Self Care Home Management;Aquatic Therapy;DME Instruction;Therapeutic activities;Functional mobility training;Stair training;Gait training;Therapeutic exercise;Balance training;Neuromuscular re-education;Vestibular;Patient/family education    PT Next Visit Plan discharge today    Consulted and Agree with Plan of Care Patient;Family member/caregiver    Family Member Consulted son,  Kit           Patient will benefit from skilled therapeutic intervention in order to improve the following deficits and impairments:  Abnormal gait, Decreased activity tolerance, Decreased balance, Decreased mobility, Decreased knowledge of use of DME  Visit Diagnosis: Other abnormalities of gait and mobility  Muscle weakness (generalized)     Problem List Patient Active Problem List   Diagnosis Date Noted  . Paroxysmal atrial fibrillation ([place]) 08/08/2019  . Secondary hypercoagulable state ([place]) 08/08/2019  . Acute cerebrovascular accident of cerebellum ([place])  07/31/2019  . GERD (gastroesophageal reflux disease)   . Leukocytosis   . Morbid obesity ([place]) 01/25/2018  . Chest pain with low risk for cardiac etiology 12/31/2016  . Essential hypertension 12/31/2016  . Hyperlipidemia 12/31/2016  . DM (diabetes mellitus), type 2 with complications ([place]) 22/63/3354    Electa Sniff, PT, DPT, NCS 11/13/2019, 5:09 PM  [facility] [address] [place], [place], [address] Phone: [phone]   Fax:  [phone]  Name: [name] MRN: [id number] Date of Birth: [birth year]

## 2019-11-13 NOTE — Progress Notes (Signed)
[facility] Summary Report / Loop Recorder 

## 2019-11-13 NOTE — Patient Instructions (Signed)
Access Code: [email or web address] URL: [email or web address] Date: 11/13/2019 Prepared by: Cherly Anderson  Exercises Sit to Stand - 2 x daily - 7 x weekly - 2 sets - 5 reps Walking March - 1 x daily - 5 x weekly - 1 sets - 4 reps Side Stepping with Counter Support - 1 x daily - 5 x weekly - 1 sets - 4 reps Standing on foam eyes open - 2 x daily - 5 x weekly - 1 sets - 3 reps - 30 sec hold Standing head turns - 2 x daily - 5 x weekly - 1 sets - 10 reps Romberg Stance with Head Nods on Foam Pad - 2 x daily - 5 x weekly - 1 sets - 10 reps Standing Balance with Eyes Closed on Foam - 2 x daily - 5 x weekly - 1 sets - 3 reps - 30 sec hold Tandem Stance - 2 x daily - 5 x weekly - 1 sets - 3 reps - 30 sec hold Tandem Walking with Counter Support - 1 x daily - 5 x weekly - 1 sets - 4 reps Alternating tapping cup at counter - 1 x daily - 5 x weekly - 2 sets - 10 reps

## 2019-11-16 ENCOUNTER — Telehealth: Payer: Self-pay | Admitting: *Deleted

## 2019-11-16 MED ORDER — CARVEDILOL 12.5 MG PO TABS
12.5000 mg | ORAL_TABLET | Freq: Two times a day (BID) | ORAL | 2 refills | Status: AC
Start: 2019-11-16 — End: ?

## 2019-11-16 NOTE — Telephone Encounter (Signed)
-----   Message from Will Meredith Leeds, MD sent at 11/13/2019 10:35 AM EDT ----- Abnormal LINQ reviewed. Notable for AF wiith RVR, increase coreg to 12.5 mg.

## 2019-11-16 NOTE — Telephone Encounter (Signed)
Informed patient of results and verbal understanding expressed.  

## 2019-11-19 ENCOUNTER — Telehealth: Payer: Self-pay | Admitting: Cardiology

## 2019-11-19 NOTE — Telephone Encounter (Signed)
Patient was previously instructed: to continue the carvedilol at 12.5 mg BID and keep appointment with the Afib Clinic and let us know if her Sx changed or worsened. She verbalized understanding.

## 2019-11-19 NOTE — Telephone Encounter (Signed)
Pt c/o medication issue:  1. Name of Medication: carvedilol (COREG) 12.5 MG tablet  2. How are you currently taking this medication (dosage and times per day)? As directed  3. Are you having a reaction (difficulty breathing--STAT)? No   4. What is your medication issue? Pt wants to make sure she is taking this medication the correct way. There have been some changes since her hospitalization and she just wanted to make sure she has the instructions correct.  She also wanted to know if she needed to come in to see Dr. Ellyn Hack or not. Please advise

## 2019-11-19 NOTE — Telephone Encounter (Signed)
Called and spoke to patient. She states that  She has had a few episodes with Afib RVR since she last saw Dr. Ellyn Hack and wanted to verify the dose of carvedilol that she should be taking and see if she needed follow up scheduled with Dr. Ellyn Hack. Patient has a LINQ and most recent transmission reviewed on 11/16/19 showing Afib with RVR. Patient was advised to increase her carvedilol to 12.5 mg BID. She states that she has not had any palpitations or any other Sx since the increase. She states that her BP and HR are good (did not have readings available). Patient is being followed by the Afib Clinic and has an appointment on 12/12/19. Patient last saw Dr. Ellyn Hack in 01/2019 and was instructed to follow up PRN. Instructed the patient to continue the carvedilol at 12.5 mg BID and keep appointment with the Afib Clinic and let us know if her Sx changed or worsened. She verbalized understanding and thanked me for the call.

## 2019-11-19 NOTE — Telephone Encounter (Signed)
I would say that she should continue the higher dose of carvedilol.  I would not worry that she had a stroke and had a Linq recorder placed. ->  She should just follow-up with the A. fib clinic and I will see her once they have her stabilized.   Glenetta Hew, MD

## 2019-11-20 ENCOUNTER — Ambulatory Visit: Payer: Medicare Other

## 2019-12-06 ENCOUNTER — Other Ambulatory Visit (HOSPITAL_COMMUNITY): Payer: Self-pay | Admitting: Physician Assistant

## 2019-12-10 ENCOUNTER — Ambulatory Visit: Payer: Medicare Other | Admitting: Adult Health

## 2019-12-12 ENCOUNTER — Ambulatory Visit (HOSPITAL_COMMUNITY)
Admission: RE | Admit: 2019-12-12 | Discharge: 2019-12-12 | Disposition: A | Discharge status: Home / Self Care | Payer: Medicare Other | Source: Ambulatory Visit | Attending: Physician Assistant | Admitting: Physician Assistant

## 2019-12-12 ENCOUNTER — Other Ambulatory Visit: Payer: Self-pay

## 2019-12-12 ENCOUNTER — Encounter (HOSPITAL_COMMUNITY): Payer: Self-pay | Admitting: Physician Assistant

## 2019-12-12 VITALS — BP 126/58 | HR 59 | Ht 60.0 in | Wt 228.6 lb

## 2019-12-12 DIAGNOSIS — Z79899 Other long term (current) drug therapy: Secondary | ICD-10-CM | POA: Insufficient documentation

## 2019-12-12 DIAGNOSIS — Z794 Long term (current) use of insulin: Secondary | ICD-10-CM | POA: Insufficient documentation

## 2019-12-12 DIAGNOSIS — I10 Essential (primary) hypertension: Secondary | ICD-10-CM | POA: Insufficient documentation

## 2019-12-12 DIAGNOSIS — Z8249 Family history of ischemic heart disease and other diseases of the circulatory system: Secondary | ICD-10-CM | POA: Diagnosis not present

## 2019-12-12 DIAGNOSIS — E875 Hyperkalemia: Secondary | ICD-10-CM | POA: Insufficient documentation

## 2019-12-12 DIAGNOSIS — I480 Paroxysmal atrial fibrillation: Secondary | ICD-10-CM | POA: Insufficient documentation

## 2019-12-12 DIAGNOSIS — K219 Gastro-esophageal reflux disease without esophagitis: Secondary | ICD-10-CM | POA: Insufficient documentation

## 2019-12-12 DIAGNOSIS — D6869 Other thrombophilia: Secondary | ICD-10-CM | POA: Diagnosis not present

## 2019-12-12 DIAGNOSIS — E119 Type 2 diabetes mellitus without complications: Secondary | ICD-10-CM | POA: Diagnosis not present

## 2019-12-12 DIAGNOSIS — Z7901 Long term (current) use of anticoagulants: Secondary | ICD-10-CM | POA: Insufficient documentation

## 2019-12-12 DIAGNOSIS — Z6841 Body Mass Index (BMI) 40.0 and over, adult: Secondary | ICD-10-CM | POA: Diagnosis not present

## 2019-12-12 DIAGNOSIS — E669 Obesity, unspecified: Secondary | ICD-10-CM | POA: Insufficient documentation

## 2019-12-12 DIAGNOSIS — Z8673 Personal history of transient ischemic attack (TIA), and cerebral infarction without residual deficits: Secondary | ICD-10-CM | POA: Diagnosis not present

## 2019-12-12 NOTE — Progress Notes (Signed)
Primary Care Physician: Orpah Melter, MD Primary Cardiologist: Dr Ellyn Hack Primary Electrophysiologist: Dr Curt Bears Referring Physician: Dr Evie Lacks [name] is a 74 y.o. female with a history of DM, HTN, GERD, HLD, prior CVA, and new onset paroxysmal atrial fibrillation who presents for follow up in the [facility]. 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 The device clinic received an alert for two, 4-minute episodes of afib on 08/06/19. Patient has a CHADS2VASC score of 6. She was unaware of her arrhythmia. She does state that since her stroke she has had issues with balance and nausea. She denies significant alcohol use or snoring.   On follow up today, patient reports she has done well since her last visit. She has had minimal palpitations since increasing Coreg. ILR shows afib burden on 0.2%. She does still report frequent dizziness and has an appointment with neurology for evaluation.   Today, she denies symptoms of palpitations, chest pain, shortness of breath, orthopnea, PND, lower extremity edema, presyncope, syncope, snoring, daytime somnolence, bleeding. The patient is tolerating medications without difficulties and is otherwise without complaint today.   Atrial Fibrillation Risk Factors:  she does not have symptoms or diagnosis of sleep apnea. she does not have a history of rheumatic fever. she does not have a history of alcohol use. The patient does not have a history of early familial atrial fibrillation or other arrhythmias.  she has a BMI of Body mass index is 44.65 kg/m.Marland Kitchen Filed Weights   12/12/19 0901  Weight: 103.7 kg    Family History  Problem Relation Age of Onset  . Diabetes Mother   . Hyperlipidemia Mother   . Stroke Mother 66  . Liver cancer Father   . Hypertension Sister   . Hyperlipidemia Sister   . Diabetes Sister        On insulin  . Depression Sister   .  Kidney disease Brother        After dialysis he had renal transplant  . Diabetes Brother   . Coronary artery disease Brother   . Peripheral Artery Disease Brother   . Stroke Brother 56  . Bladder Cancer Brother   . Diabetes Brother      Atrial Fibrillation Management history:  Previous antiarrhythmic drugs: none Previous cardioversions: none Previous ablations: none CHADS2VASC score: 6 Anticoagulation history: Eliquis   Past Medical History:  Diagnosis Date  . Actinic keratosis    Followed by Dr. Allyson Sabal  . Anxiety   . Diabetes mellitus, type II, insulin dependent ([place])    Associated with obesity: She takes Lantus, Victoza & Actos  . Essential hypertension   . GERD (gastroesophageal reflux disease)   . Hyperlipidemia   . Morbid obesity with BMI of 40.0-44.9, adult [facility])    Past Surgical History:  Procedure Laterality Date  . BREAST BIOPSY  1997  . LEFT HEART CATH AND CORONARY ANGIOGRAPHY N/A 01/05/2017   Procedure: LEFT HEART CATH AND CORONARY ANGIOGRAPHY;  Surgeon: Leonie Man, MD;  Location: [facility];  Service: Cardiovascular: Angiographically normal coronary arteries with a right dominant system.  Normal EF 55-65%.  Marland Kitchen LOOP RECORDER INSERTION N/A 08/01/2019   Procedure: LOOP RECORDER INSERTION;  Surgeon: Constance Haw, MD;  Location: [facility];  Service: Cardiovascular;  Laterality: N/A;    Current Outpatient Medications  Medication Sig Dispense Refill  . atorvastatin (LIPITOR) 40 MG tablet Take 1 tablet (40  mg total) by mouth daily. 30 tablet 2  . blood glucose meter kit and supplies KIT Dispense based on patient and insurance preference. Use up to four times daily as directed. (FOR ICD-9 250.00, 250.01). 1 each 0  . carvedilol (COREG) 12.5 MG tablet Take 1 tablet (12.5 mg total) by mouth 2 (two) times daily. 180 tablet 2  . ELIQUIS 5 MG TABS tablet Take 1 tablet by mouth twice daily 60 tablet 3  . Insulin Syringe-Needle U-100 (RELION  INSULIN SYR .3CC/29G) 29G X 1/2" 0.3 ML MISC 1 each by Does not apply route in the morning and at bedtime. 100 each 0  . Lancets (ONETOUCH DELICA PLUS [id number]) MISC USE 1 LANCET TO CHECK GLUCOSE UP TO 4 TIMES DAILY    . LANTUS SOLOSTAR 100 UNIT/ML Solostar Pen SMARTSIG:30 Unit(s) SUB-Q Daily    . lisinopril-hydrochlorothiazide (ZESTORETIC) 20-25 MG tablet Take 1 tablet by mouth daily.    . Naproxen Sod-Diphenhydramine (ALEVE PM) 220-25 MG TABS Take 2 tablets by mouth at bedtime as needed (sleep).    . nitroGLYCERIN (NITROSTAT) 0.4 MG SL tablet Place 1 tablet (0.4 mg total) under the tongue every 5 (five) minutes as needed for chest pain. 25 tablet 5  . ONETOUCH VERIO test strip USE 1 STRIP TO CHECK GLUCOSE UP TO 4 TIMES DAILY AS DIRECTED    . pioglitazone (ACTOS) 30 MG tablet Take 30 mg by mouth every evening.     Marland Kitchen RELION PEN NEEDLES 31G X 6 MM MISC USE 1 IN THE MORNING AND AT BEDTIME     No current facility-administered medications for this encounter.    Allergies  Allergen Reactions  . Livalo [Pitavastatin]     MYALGIAS   . Sulfa Antibiotics Itching    Itching and red spots.    Social History   Socioeconomic History  . Marital status: Widowed    Spouse name: Not on file  . Number of children: 3  . Years of education: 106  . Highest education level: Not on file  Occupational History    Comment: Retired  Tobacco Use  . Smoking status: Never Smoker  . Smokeless tobacco: Never Used  Substance and Sexual Activity  . Alcohol use: No  . Drug use: No  . Sexual activity: Not Currently  Other Topics Concern  . Not on file  Social History Narrative   She is a widowed mother of 3 with 5 grandchildren. She lives with one of her daughters and grandson. She is a coming by her sister. She tries to walk, but does not do it regularly.   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   Social Determinants of Health   Financial Resource Strain:   . Difficulty of Paying Living  Expenses: Not on file  Food Insecurity:   . Worried About Charity fundraiser in the Last Year: Not on file  . Ran Out of Food in the Last Year: Not on file  Transportation Needs:   . Lack of Transportation (Medical): Not on file  . Lack of Transportation (Non-Medical): Not on file  Physical Activity:   . Days of Exercise per Week: Not on file  . Minutes of Exercise per Session: Not on file  Stress:   . Feeling of Stress : Not on file  Social Connections:   . Frequency of Communication with Friends and Family: Not on file  . Frequency of Social Gatherings with Friends and Family: Not on file  . Attends  Religious Services: Not on file  . Active Member of Clubs or Organizations: Not on file  . Attends Archivist Meetings: Not on file  . Marital Status: Not on file  Intimate Partner Violence:   . Fear of Current or Ex-Partner: Not on file  . Emotionally Abused: Not on file  . Physically Abused: Not on file  . Sexually Abused: Not on file     ROS- All systems are reviewed and negative except as per the HPI above.  Physical Exam: Vitals:   12/12/19 0901  BP: (!) 126/58  Pulse: (!) 59  Weight: 103.7 kg  Height: 5' (1.524 m)    GEN- The patient is well appearing obese elderly female, alert and oriented x 3 today.   HEENT-head normocephalic, atraumatic, sclera clear, conjunctiva pink, hearing intact, trachea midline. Lungs- Clear to ausculation bilaterally, normal work of breathing Heart- Regular rate and rhythm, no murmurs, rubs or gallops  GI- soft, NT, ND, + BS Extremities- no clubbing, cyanosis, or edema MS- no significant deformity or atrophy Skin- no rash or lesion Psych- euthymic mood, full affect Neuro- strength and sensation are intact   Wt Readings from Last 3 Encounters:  12/12/19 103.7 kg  09/11/19 92.5 kg  09/04/19 92.1 kg    EKG today demonstrates SB HR 59, PR 172, QRS 84, QTc 411  Echo 08/01/19 demonstrated  1. Left ventricular ejection  fraction, by estimation, is 60 to 65%. The  left ventricle has normal function. The left ventricle has no regional  wall motion abnormalities. Left ventricular diastolic parameters are  consistent with Grade I diastolic  dysfunction (impaired relaxation). Elevated left atrial pressure.  2. Right ventricular systolic function is normal. The right ventricular  size is normal. There is normal pulmonary artery systolic pressure. The  estimated right ventricular systolic pressure is 86.7 mmHg.  3. The mitral valve is normal in structure. Trivial mitral valve  regurgitation. No evidence of mitral stenosis.  4. Tricuspid valve regurgitation is moderate.  5. The aortic valve is normal in structure. Aortic valve regurgitation is  not visualized. No aortic stenosis is present.  6. The inferior vena cava is normal in size with greater than 50%  respiratory variability, suggesting right atrial pressure of 3 mmHg.   Conclusion(s)/Recommendation(s): No intracardiac source of embolism  detected on this transthoracic study. A transesophageal echocardiogram is  recommended to exclude cardiac source of embolism if clinically indicated.   Epic records are reviewed at length today  CHA2DS2-VASc Score = 6  The patient's score is based upon: CHF History: 0 HTN History: 1 Age : 1 Diabetes History: 1 Stroke History: 2 Vascular Disease History: 0 Gender: 1      ASSESSMENT AND PLAN: 1. Paroxysmal Atrial Fibrillation (ICD10:  I48.0) The patient's CHA2DS2-VASc score is 6, indicating a 9.7% annual risk of stroke.   AF burden on ILR is 0.2% Continue Eliquis 5 mg BID Continue Coreg 12.5 mg BID  2. Secondary Hypercoagulable State (ICD10:  D68.69) The patient is at significant risk for stroke/thromboembolism based upon her CHA2DS2-VASc Score of 6.  Start Apixaban (Eliquis).   3. Obesity Body mass index is 44.65 kg/m. Lifestyle modification was discussed and encouraged including regular physical  activity and weight reduction.  4. HTN Stable, no changes today.   Follow up in the AF clinic in 6 months.    [facility] [address] [place], [place] [address] [phone] 12/12/2019 9:19 AM

## 2019-12-14 LAB — CUP PACEART REMOTE DEVICE CHECK
Date Time Interrogation Session: 20210915230449
Implantable Pulse Generator Implant Date: 20210505

## 2019-12-17 ENCOUNTER — Ambulatory Visit (INDEPENDENT_AMBULATORY_CARE_PROVIDER_SITE_OTHER): Payer: Medicare Other | Admitting: *Deleted

## 2019-12-17 DIAGNOSIS — I639 Cerebral infarction, unspecified: Secondary | ICD-10-CM | POA: Diagnosis not present

## 2019-12-17 NOTE — Progress Notes (Signed)
[facility] Summary Report / Loop Recorder 

## 2019-12-20 ENCOUNTER — Encounter: Payer: Self-pay | Admitting: *Deleted

## 2019-12-25 ENCOUNTER — Other Ambulatory Visit: Payer: Self-pay

## 2019-12-25 ENCOUNTER — Ambulatory Visit: Payer: Medicare Other | Admitting: Neurology

## 2019-12-25 ENCOUNTER — Encounter: Payer: Self-pay | Admitting: Neurology

## 2019-12-25 VITALS — BP 158/70 | HR 61 | Ht 60.0 in | Wt 233.0 lb

## 2019-12-25 DIAGNOSIS — E538 Deficiency of other specified B group vitamins: Secondary | ICD-10-CM | POA: Diagnosis not present

## 2019-12-25 DIAGNOSIS — H532 Diplopia: Secondary | ICD-10-CM | POA: Diagnosis not present

## 2019-12-25 NOTE — Progress Notes (Signed)
Reason for visit: Double vision  Referring physician: Dr. Salina Johnson is a 74 y.o. female  History of present illness:  [name] is a 74 year old right-handed white female with a history of obesity, diabetes, hypertension, and cerebrovascular disease. 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 She is now on anticoagulation therapy. The patient however claims that her double vision began spontaneously in August 2020, well before her cerebellar stroke. The patient has noted double vision in a vertical plane, the double vision will be transient and does not happen every day. The patient has no impairment in her ability to drive, read a book, or watch TV. She reports no headaches, nausea or vomiting, or any focal numbness or weakness of extremities. Following the stroke in May 2021, she did have some balance issues that have gradually improved over time. With the diabetes, she does not report any numbness in the feet or tingling or burning in the feet at night. The patient has not had any blacking out episodes. Her hemoglobin A1c was 10.4 at the time of the stroke, it has improved some since then. She has recently had blood work done through her primary care physician that included a thyroid profile, she is sent to this office for an evaluation. Since onset, the double vision has remained stable.  Past Medical History:  Diagnosis Date  . Actinic keratosis    Followed by Dr. Allyson Sabal  . Anxiety   . Diabetes mellitus, type II, insulin dependent ([place])    Associated with obesity: She takes Lantus, Victoza & Actos  . Diplopia   . Essential hypertension   . GERD (gastroesophageal reflux disease)   . Hyperlipidemia   . Morbid obesity with BMI of 40.0-44.9, adult [facility])     Past Surgical History:  Procedure Laterality Date  . BREAST BIOPSY  1997  . LEFT HEART CATH AND CORONARY  ANGIOGRAPHY N/A 01/05/2017   Procedure: LEFT HEART CATH AND CORONARY ANGIOGRAPHY;  Surgeon: Leonie Man, MD;  Location: [place] [facility];  Service: Cardiovascular: Angiographically normal coronary arteries with a right dominant system.  Normal EF 55-65%.  Marland Kitchen LOOP RECORDER INSERTION N/A 08/01/2019   Procedure: LOOP RECORDER INSERTION;  Surgeon: Constance Haw, MD;  Location: [place] [facility];  Service: Cardiovascular;  Laterality: N/A;    Family History  Problem Relation Age of Onset  . Diabetes Mother   . Hyperlipidemia Mother   . Stroke Mother 50  . Liver cancer Father   . Hypertension Sister   . Hyperlipidemia Sister   . Diabetes Sister        On insulin  . Depression Sister   . Kidney disease Brother        After dialysis he had renal transplant  . Diabetes Brother   . Coronary artery disease Brother   . Peripheral Artery Disease Brother   . Stroke Brother 6  . Bladder Cancer Brother   . Diabetes Brother     Social history:  reports that she has never smoked. She has never used smokeless tobacco. She reports that she does not drink alcohol and does not use drugs.  Medications:  Prior to Admission medications   Medication Sig Start Date End Date Taking? Authorizing Provider  blood glucose meter kit and supplies KIT Dispense based on patient and insurance preference. Use up to four times daily as directed. (FOR ICD-9  250.00, 250.01). 08/02/19  Yes Ghimire, Dante Gang, MD  carvedilol (COREG) 12.5 MG tablet Take 1 tablet (12.5 mg total) by mouth 2 (two) times daily. 11/16/19  Yes Camnitz, Will Hassell Done, MD  ELIQUIS 5 MG TABS tablet Take 1 tablet by mouth twice daily 12/06/19  Yes Fenton, Clint R, PA  Insulin Syringe-Needle U-100 (RELION INSULIN SYR .3CC/29G) 29G X 1/2" 0.3 ML MISC 1 each by Does not apply route in the morning and at bedtime. 08/02/19  Yes Ghimire, Dante Gang, MD  Lancets (ONETOUCH DELICA PLUS [id number]) [place] USE 1 LANCET TO CHECK GLUCOSE UP TO 4 TIMES DAILY 08/02/19  Yes  [provider]  LANTUS SOLOSTAR 100 UNIT/ML Solostar Pen SMARTSIG:30 Unit(s) SUB-Q Daily 11/12/19  Yes [provider]  lisinopril-hydrochlorothiazide (ZESTORETIC) 20-25 MG tablet Take 1 tablet by mouth daily. 09/17/19  Yes [provider]  Naproxen Sod-Diphenhydramine (ALEVE PM) 220-25 MG TABS Take 2 tablets by mouth at bedtime as needed (sleep).   Yes [provider]  ONETOUCH VERIO test strip USE 1 STRIP TO CHECK GLUCOSE UP TO 4 TIMES DAILY AS DIRECTED 08/02/19  Yes [provider]  pioglitazone (ACTOS) 30 MG tablet Take 30 mg by mouth every evening.  12/27/16  Yes [provider]  RELION PEN NEEDLES 31G X 6 MM MISC USE 1 IN THE MORNING AND AT BEDTIME 08/02/19  Yes [provider]  atorvastatin (LIPITOR) 40 MG tablet Take 1 tablet (40 mg total) by mouth daily. 08/03/19 12/12/19  Barb Merino, MD  nitroGLYCERIN (NITROSTAT) 0.4 MG SL tablet Place 1 tablet (0.4 mg total) under the tongue every 5 (five) minutes as needed for chest pain. 12/31/16 12/12/19  Leonie Man, MD      Allergies  Allergen Reactions  . Livalo [Pitavastatin]     MYALGIAS   . Sulfa Antibiotics Itching    Itching and red spots.    ROS:  Out of a complete 14 system review of symptoms, the patient complains only of the following symptoms, and all other reviewed systems are negative.  Double vision Walking difficulty  Blood pressure (!) 158/70, pulse 61, height 5' (1.524 m), weight 233 lb (105.7 kg).  Physical Exam  General: The patient is alert and cooperative at the time of the examination. The patient is markedly obese.  Eyes: Pupils are equal, round, and reactive to light. Discs are flat bilaterally.  Neck: The neck is supple, no carotid bruits are noted.  Respiratory: The respiratory examination is clear.  Cardiovascular: The cardiovascular examination reveals a regular rate and rhythm, no obvious murmurs or rubs are noted.  Skin: Extremities are  without significant edema.  Neurologic Exam  Mental status: The patient is alert and oriented x 3 at the time of the examination. The patient has apparent normal recent and remote memory, with an apparently normal attention span and concentration ability.  Cranial nerves: Facial symmetry is present. There is good sensation of the face to pinprick and soft touch bilaterally. The strength of the facial muscles and the muscles to head turning and shoulder shrug are normal bilaterally. Speech is well enunciated, no aphasia or dysarthria is noted. Extraocular movements are full. Visual fields are full. The tongue is midline, and the patient has symmetric elevation of the soft palate. No obvious hearing deficits are noted. With the cover test, there is elevation of the right eye and inferior deviation of the left eye.  Motor: The motor testing reveals 5 over 5 strength of all 4 extremities. Good symmetric motor  tone is noted throughout.  Sensory: Sensory testing is intact to pinprick, soft touch, vibration sensation, and position sense on all 4 extremities. No evidence of extinction is noted.  Coordination: Cerebellar testing reveals good finger-nose-finger and heel-to-shin bilaterally.  Gait and station: Gait is normal. Tandem gait is unsteady. Romberg is negative. No drift is seen.  Reflexes: Deep tendon reflexes are symmetric, but are somewhat depressed bilaterally. Toes are downgoing bilaterally.   MRI brain 07/31/19:  IMPRESSION: Multiple small acute right cerebellar infarcts.  Minor chronic microvascular ischemic changes.  * MRI scan images were reviewed online. I agree with the written report.   CTA head and neck 08/01/19:  IMPRESSION: 1. Negative CTA for large vessel occlusion or other acute vascular abnormality. 2. Atheromatous change about the carotid siphons with associated mild to moderate multifocal narrowing, greater on the right. 3. No other hemodynamically significant or  correctable stenosis about the major arterial vasculature of the head and neck. 4. 5 mm right upper lobe nodule, indeterminate. No follow-up needed if patient is low-risk. Non-contrast chest CT can be considered in 12 months if patient is high-risk.     Assessment/Plan:  1. Double vision, chronic  2. Diabetes  3. Atrial fibrillation on anticoagulation  4. History of right cerebellar stroke event, embolic, May 6808  It appears that the stroke event really had nothing to do with onset of double vision. The patient does have double vision which may potentially affect the cranial nerves, but the double vision issue has remained persistent for greater than 1 year. It appears that it is not impacting the patient's ability to perform day-to-day activity. The patient may benefit from a prism if needed in the future. We will get blood work today looking for other etiologies of double vision. She will follow up here if needed.  Jill Alexanders MD 12/25/2019 9:54 AM  [facility] [address] [place], [place] [address]  Phone [phone] Fax [phone]

## 2019-12-30 LAB — VITAMIN B12: Vitamin B-12: 384 pg/mL (ref 232–1245)

## 2019-12-30 LAB — RHEUMATOID FACTOR: Rheumatoid fact SerPl-aCnc: <10 IU/mL (ref 0.0–13.9)

## 2019-12-30 LAB — ANA W/REFLEX: Anti Nuclear Antibody (ANA): NEGATIVE

## 2019-12-30 LAB — ACETYLCHOLINE RECEPTOR, BINDING: AChR Binding Ab, Serum: <0.03 nmol/L (ref 0.00–0.24)

## 2019-12-30 LAB — ANGIOTENSIN CONVERTING ENZYME: Angio Convert Enzyme: 14 U/L (ref 14–82)

## 2019-12-30 LAB — SEDIMENTATION RATE: Sed Rate: 17 mm/hr (ref 0–40)

## 2019-12-30 LAB — B. BURGDORFI ANTIBODIES: Lyme IgG/IgM Ab: <0.91 {ISR} (ref 0.00–0.90)

## 2019-12-30 LAB — COPPER, SERUM: Copper: 96 ug/dL (ref 80–158)

## 2020-01-01 ENCOUNTER — Telehealth: Payer: Self-pay

## 2020-01-01 NOTE — Telephone Encounter (Signed)
-----   Message from Kathrynn Ducking, MD sent at 12/31/2019 10:34 AM EDT -----  The blood work results are unremarkable. Please call the patient.  Let the patient know that the best way to correct double vision if needed would probably need to get prisms through her ophthalmologist. ----- Message ----- From: Interface, [facility] Lab Results In Sent: 12/26/2019   7:37 AM EDT To: Kathrynn Ducking, MD

## 2020-01-01 NOTE — Telephone Encounter (Signed)
Pt verified by name and DOB, results given per provider, pt voiced understanding all question answered. 

## 2020-01-15 LAB — CUP PACEART REMOTE DEVICE CHECK
Date Time Interrogation Session: 20211018230707
Implantable Pulse Generator Implant Date: 20210505

## 2020-01-21 ENCOUNTER — Ambulatory Visit (INDEPENDENT_AMBULATORY_CARE_PROVIDER_SITE_OTHER): Payer: Medicare Other

## 2020-01-21 DIAGNOSIS — I639 Cerebral infarction, unspecified: Secondary | ICD-10-CM | POA: Diagnosis not present

## 2020-01-23 NOTE — Progress Notes (Signed)
[facility] Summary Report / Loop Recorder 

## 2020-02-22 LAB — CUP PACEART REMOTE DEVICE CHECK
Date Time Interrogation Session: 20211120230154
Implantable Pulse Generator Implant Date: 20210505

## 2020-02-25 ENCOUNTER — Ambulatory Visit (INDEPENDENT_AMBULATORY_CARE_PROVIDER_SITE_OTHER): Payer: Medicare Other

## 2020-02-25 DIAGNOSIS — I639 Cerebral infarction, unspecified: Secondary | ICD-10-CM | POA: Diagnosis not present

## 2020-02-29 NOTE — Progress Notes (Signed)
[facility] Summary Report / Loop Recorder 

## 2020-03-04 ENCOUNTER — Other Ambulatory Visit (HOSPITAL_COMMUNITY): Payer: Self-pay

## 2020-03-04 MED ORDER — APIXABAN 5 MG PO TABS: 5.0000 mg | ORAL_TABLET | Freq: Two times a day (BID) | ORAL | 6 refills | Status: DC

## 2020-03-31 ENCOUNTER — Ambulatory Visit (INDEPENDENT_AMBULATORY_CARE_PROVIDER_SITE_OTHER): Payer: HMO

## 2020-03-31 DIAGNOSIS — I639 Cerebral infarction, unspecified: Secondary | ICD-10-CM

## 2020-04-01 LAB — CUP PACEART REMOTE DEVICE CHECK
Date Time Interrogation Session: 20220101230259
Implantable Pulse Generator Implant Date: 20210505

## 2020-04-07 DIAGNOSIS — E1142 Type 2 diabetes mellitus with diabetic polyneuropathy: Secondary | ICD-10-CM | POA: Diagnosis not present

## 2020-04-07 DIAGNOSIS — E782 Mixed hyperlipidemia: Secondary | ICD-10-CM | POA: Diagnosis not present

## 2020-04-07 DIAGNOSIS — K219 Gastro-esophageal reflux disease without esophagitis: Secondary | ICD-10-CM | POA: Diagnosis not present

## 2020-04-07 DIAGNOSIS — I10 Essential (primary) hypertension: Secondary | ICD-10-CM | POA: Diagnosis not present

## 2020-04-07 DIAGNOSIS — I480 Paroxysmal atrial fibrillation: Secondary | ICD-10-CM | POA: Diagnosis not present

## 2020-04-07 DIAGNOSIS — I200 Unstable angina: Secondary | ICD-10-CM | POA: Diagnosis not present

## 2020-04-14 NOTE — Progress Notes (Signed)
[facility] Summary Report / Loop Recorder 

## 2020-05-02 ENCOUNTER — Telehealth: Payer: Self-pay

## 2020-05-02 LAB — CUP PACEART REMOTE DEVICE CHECK
Date Time Interrogation Session: 20220203230521
Implantable Pulse Generator Implant Date: 20210505

## 2020-05-02 NOTE — Telephone Encounter (Signed)
ILR alert received for AF. ILR was implanted for CVA. Patient is already followed by AF clinic.  Attempted to call patient to discuss s/s d/t elevated rates. No answer. LMOVM.

## 2020-05-02 NOTE — Telephone Encounter (Signed)
Patient returning phone call. Reports she has not felt well for over a month with intermittent dizziness (having to use her walker more) as well as GI upset. Daughter reports patient has been under more stress than usual. Patient is currently asymptomatic today and was not aware of this specific event. Reports compliance with all medications including Eliquis 5 mg BID, coreg 12.5 mg BID. Has 6 month apt. With AF clinic on 06/11/20. Advised patient I would forward to AF clinic and we will call with any changes. ED precautions reviewed. Advised patient to call with further questions or concerns. Verbalized understanding.

## 2020-05-02 NOTE — Telephone Encounter (Signed)
-----   Message from Will Meredith Leeds, MD sent at 05/02/2020  2:59 PM EST ----- Abnormal LINQ reviewed. Notable for AF. Refer to AF clinic.

## 2020-05-05 ENCOUNTER — Ambulatory Visit (INDEPENDENT_AMBULATORY_CARE_PROVIDER_SITE_OTHER): Payer: HMO

## 2020-05-05 DIAGNOSIS — I639 Cerebral infarction, unspecified: Secondary | ICD-10-CM | POA: Diagnosis not present

## 2020-05-09 NOTE — Progress Notes (Signed)
[facility] Summary Report / Loop Recorder 

## 2020-05-24 DIAGNOSIS — E782 Mixed hyperlipidemia: Secondary | ICD-10-CM | POA: Diagnosis not present

## 2020-05-24 DIAGNOSIS — I10 Essential (primary) hypertension: Secondary | ICD-10-CM | POA: Diagnosis not present

## 2020-05-24 DIAGNOSIS — I200 Unstable angina: Secondary | ICD-10-CM | POA: Diagnosis not present

## 2020-05-24 DIAGNOSIS — K219 Gastro-esophageal reflux disease without esophagitis: Secondary | ICD-10-CM | POA: Diagnosis not present

## 2020-05-24 DIAGNOSIS — I480 Paroxysmal atrial fibrillation: Secondary | ICD-10-CM | POA: Diagnosis not present

## 2020-05-24 DIAGNOSIS — E1142 Type 2 diabetes mellitus with diabetic polyneuropathy: Secondary | ICD-10-CM | POA: Diagnosis not present

## 2020-06-09 ENCOUNTER — Ambulatory Visit (INDEPENDENT_AMBULATORY_CARE_PROVIDER_SITE_OTHER): Payer: HMO

## 2020-06-09 DIAGNOSIS — I639 Cerebral infarction, unspecified: Secondary | ICD-10-CM

## 2020-06-10 NOTE — Progress Notes (Signed)
Primary Care Physician: Orpah Melter, MD Primary Cardiologist: Dr Ellyn Hack Primary Electrophysiologist: Dr Curt Bears Referring Physician: Dr Evie Lacks [name] is a 75 y.o. female with a history of DM, HTN, GERD, HLD, prior CVA, and new onset paroxysmal atrial fibrillation who presents for follow up in the [facility]. 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 The device clinic received an alert for two, 4-minute episodes of afib on 08/06/19. Patient has a CHADS2VASC score of 6. She was unaware of her arrhythmia. She does state that since her stroke she has had issues with balance and nausea. She denies significant alcohol use or snoring.   On follow up today, patient reports she has done well since her last visit. ILR shows 0.1% afib burden. She denies any heart racing or palpitations. She also denies any bleeding issues on anticoagulation.   Today, she denies symptoms of palpitations, chest pain, shortness of breath, orthopnea, PND, lower extremity edema, presyncope, syncope, snoring, daytime somnolence, bleeding. The patient is tolerating medications without difficulties and is otherwise without complaint today.   Atrial Fibrillation Risk Factors:  she does not have symptoms or diagnosis of sleep apnea. she does not have a history of rheumatic fever. she does not have a history of alcohol use. The patient does not have a history of early familial atrial fibrillation or other arrhythmias.  she has a BMI of Body mass index is 47.18 kg/m.Marland Kitchen Filed Weights   06/11/20 0911  Weight: 109.6 kg    Family History  Problem Relation Age of Onset  . Diabetes Mother   . Hyperlipidemia Mother   . Stroke Mother 43  . Liver cancer Father   . Hypertension Sister   . Hyperlipidemia Sister   . Diabetes Sister        On insulin  . Depression Sister   . Kidney disease Brother        After dialysis he had  renal transplant  . Diabetes Brother   . Coronary artery disease Brother   . Peripheral Artery Disease Brother   . Stroke Brother 14  . Bladder Cancer Brother   . Diabetes Brother      Atrial Fibrillation Management history:  Previous antiarrhythmic drugs: none Previous cardioversions: none Previous ablations: none CHADS2VASC score: 6 Anticoagulation history: Eliquis   Past Medical History:  Diagnosis Date  . Actinic keratosis    Followed by Dr. Allyson Sabal  . Anxiety   . Diabetes mellitus, type II, insulin dependent ([place])    Associated with obesity: She takes Lantus, Victoza & Actos  . Diplopia   . Essential hypertension   . GERD (gastroesophageal reflux disease)   . Hyperlipidemia   . Morbid obesity with BMI of 40.0-44.9, adult [facility])    Past Surgical History:  Procedure Laterality Date  . BREAST BIOPSY  1997  . LEFT HEART CATH AND CORONARY ANGIOGRAPHY N/A 01/05/2017   Procedure: LEFT HEART CATH AND CORONARY ANGIOGRAPHY;  Surgeon: Leonie Man, MD;  Location: [place] [facility];  Service: Cardiovascular: Angiographically normal coronary arteries with a right dominant system.  Normal EF 55-65%.  Marland Kitchen LOOP RECORDER INSERTION N/A 08/01/2019   Procedure: LOOP RECORDER INSERTION;  Surgeon: Constance Haw, MD;  Location: [place] [facility];  Service: Cardiovascular;  Laterality: N/A;    Current Outpatient Medications  Medication Sig Dispense Refill  . apixaban (ELIQUIS) 5 MG TABS tablet Take 1 tablet (5 mg total) by  mouth 2 (two) times daily. 60 tablet 6  . atorvastatin (LIPITOR) 40 MG tablet Take 1 tablet (40 mg total) by mouth daily. 30 tablet 2  . blood glucose meter kit and supplies KIT Dispense based on patient and insurance preference. Use up to four times daily as directed. (FOR ICD-9 250.00, 250.01). 1 each 0  . carvedilol (COREG) 12.5 MG tablet Take 1 tablet (12.5 mg total) by mouth 2 (two) times daily. 180 tablet 2  . Insulin Syringe-Needle U-100 (RELION INSULIN  SYR .3CC/29G) 29G X 1/2" 0.3 ML MISC 1 each by Does not apply route in the morning and at bedtime. 100 each 0  . Lancets (ONETOUCH DELICA PLUS [id number]) MISC USE 1 LANCET TO CHECK GLUCOSE UP TO 4 TIMES DAILY    . LANTUS SOLOSTAR 100 UNIT/ML Solostar Pen SMARTSIG:30 Unit(s) SUB-Q Daily    . lisinopril-hydrochlorothiazide (ZESTORETIC) 20-25 MG tablet Take 1 tablet by mouth daily.    . Naproxen Sod-diphenhydrAMINE 220-25 MG TABS Take 2 tablets by mouth at bedtime as needed (sleep).    . nitroGLYCERIN (NITROSTAT) 0.4 MG SL tablet Place 1 tablet (0.4 mg total) under the tongue every 5 (five) minutes as needed for chest pain. 25 tablet 5  . ONETOUCH VERIO test strip USE 1 STRIP TO CHECK GLUCOSE UP TO 4 TIMES DAILY AS DIRECTED    . pioglitazone (ACTOS) 30 MG tablet Take 30 mg by mouth every evening.     Marland Kitchen RELION PEN NEEDLES 31G X 6 MM MISC USE 1 IN THE MORNING AND AT BEDTIME    . VICTOZA 18 MG/3ML SOPN Inject 1.8 mg into the skin daily.    . Zinc 50 MG TABS 1 tablet     No current facility-administered medications for this encounter.    Allergies  Allergen Reactions  . Livalo [Pitavastatin]     MYALGIAS   . Sulfa Antibiotics Itching    Itching and red spots.    Social History   Socioeconomic History  . Marital status: Widowed    Spouse name: Not on file  . Number of children: 3  . Years of education: 70  . Highest education level: Not on file  Occupational History    Comment: Retired  Tobacco Use  . Smoking status: Never Smoker  . Smokeless tobacco: Never Used  Substance and Sexual Activity  . Alcohol use: No  . Drug use: No  . Sexual activity: Not Currently  Other Topics Concern  . Not on file  Social History Narrative   She is a widowed mother of 3 with 5 grandchildren. She lives with one of her daughters and grandson. She is a coming by her sister. She tries to walk, but does not do it regularly.   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   Coffee every  morning, tea rare   Right handed    Social Determinants of Health   Financial Resource Strain: Not on file  Food Insecurity: Not on file  Transportation Needs: Not on file  Physical Activity: Not on file  Stress: Not on file  Social Connections: Not on file  Intimate Partner Violence: Not on file     ROS- All systems are reviewed and negative except as per the HPI above.  Physical Exam: Vitals:   06/11/20 0911  BP: 136/60  Pulse: 64  Weight: 109.6 kg  Height: 5' (1.524 m)    GEN- The patient is a well appearing obese elderly female, alert and oriented x  3 today.   HEENT-head normocephalic, atraumatic, sclera clear, conjunctiva pink, hearing intact, trachea midline. Lungs- Clear to ausculation bilaterally, normal work of breathing Heart- Regular rate and rhythm, no murmurs, rubs or gallops  GI- soft, NT, ND, + BS Extremities- no clubbing, cyanosis, or edema MS- no significant deformity or atrophy Skin- no rash or lesion Psych- euthymic mood, full affect Neuro- strength and sensation are intact   Wt Readings from Last 3 Encounters:  06/11/20 109.6 kg  12/25/19 105.7 kg  12/12/19 103.7 kg    EKG today demonstrates  SR Vent. rate 64 BPM PR interval 164 ms QRS duration 82 ms QT/QTc 390/402 ms  Echo 08/01/19 demonstrated  1. Left ventricular ejection fraction, by estimation, is 60 to 65%. The  left ventricle has normal function. The left ventricle has no regional  wall motion abnormalities. Left ventricular diastolic parameters are  consistent with Grade I diastolic  dysfunction (impaired relaxation). Elevated left atrial pressure.  2. Right ventricular systolic function is normal. The right ventricular  size is normal. There is normal pulmonary artery systolic pressure. The  estimated right ventricular systolic pressure is 03.5 mmHg.  3. The mitral valve is normal in structure. Trivial mitral valve  regurgitation. No evidence of mitral stenosis.  4. Tricuspid  valve regurgitation is moderate.  5. The aortic valve is normal in structure. Aortic valve regurgitation is  not visualized. No aortic stenosis is present.  6. The inferior vena cava is normal in size with greater than 50%  respiratory variability, suggesting right atrial pressure of 3 mmHg.   Conclusion(s)/Recommendation(s): No intracardiac source of embolism  detected on this transthoracic study. A transesophageal echocardiogram is  recommended to exclude cardiac source of embolism if clinically indicated.   Epic records are reviewed at length today  CHA2DS2-VASc Score = 6  The patient's score is based upon: CHF History: No HTN History: Yes Diabetes History: Yes Stroke History: Yes Vascular Disease History: No      ASSESSMENT AND PLAN: 1. Paroxysmal Atrial Fibrillation (ICD10:  I48.0) The patient's CHA2DS2-VASc score is 6, indicating a 9.7% annual risk of stroke.   AF burden on ILR is 0.1% Continue Eliquis 5 mg BID Continue Coreg 12.5 mg BID  2. Secondary Hypercoagulable State (ICD10:  D68.69) The patient is at significant risk for stroke/thromboembolism based upon her CHA2DS2-VASc Score of 6.  Continue Apixaban (Eliquis).   3. Obesity Body mass index is 47.18 kg/m. Lifestyle modification was discussed and encouraged including regular physical activity and weight reduction.  4. HTN Stable, no changes today.   Follow up in the AF clinic in 6 months.    [facility] [address] [place], [place] [address] [phone] 06/11/2020 9:19 AM

## 2020-06-11 ENCOUNTER — Ambulatory Visit (HOSPITAL_COMMUNITY)
Admission: RE | Admit: 2020-06-11 | Discharge: 2020-06-11 | Disposition: A | Discharge status: Home / Self Care | Payer: HMO | Source: Ambulatory Visit | Attending: Physician Assistant | Admitting: Physician Assistant

## 2020-06-11 ENCOUNTER — Other Ambulatory Visit: Payer: Self-pay

## 2020-06-11 ENCOUNTER — Encounter (HOSPITAL_COMMUNITY): Payer: Self-pay | Admitting: Physician Assistant

## 2020-06-11 VITALS — BP 136/60 | HR 64 | Ht 60.0 in | Wt 241.6 lb

## 2020-06-11 DIAGNOSIS — E669 Obesity, unspecified: Secondary | ICD-10-CM | POA: Insufficient documentation

## 2020-06-11 DIAGNOSIS — D6869 Other thrombophilia: Secondary | ICD-10-CM | POA: Insufficient documentation

## 2020-06-11 DIAGNOSIS — I10 Essential (primary) hypertension: Secondary | ICD-10-CM | POA: Insufficient documentation

## 2020-06-11 DIAGNOSIS — Z79899 Other long term (current) drug therapy: Secondary | ICD-10-CM | POA: Diagnosis not present

## 2020-06-11 DIAGNOSIS — Z794 Long term (current) use of insulin: Secondary | ICD-10-CM | POA: Insufficient documentation

## 2020-06-11 DIAGNOSIS — Z6841 Body Mass Index (BMI) 40.0 and over, adult: Secondary | ICD-10-CM | POA: Insufficient documentation

## 2020-06-11 DIAGNOSIS — Z8673 Personal history of transient ischemic attack (TIA), and cerebral infarction without residual deficits: Secondary | ICD-10-CM | POA: Diagnosis not present

## 2020-06-11 DIAGNOSIS — Z7901 Long term (current) use of anticoagulants: Secondary | ICD-10-CM | POA: Diagnosis not present

## 2020-06-11 DIAGNOSIS — E119 Type 2 diabetes mellitus without complications: Secondary | ICD-10-CM | POA: Diagnosis not present

## 2020-06-11 DIAGNOSIS — I480 Paroxysmal atrial fibrillation: Secondary | ICD-10-CM | POA: Insufficient documentation

## 2020-06-11 DIAGNOSIS — K219 Gastro-esophageal reflux disease without esophagitis: Secondary | ICD-10-CM | POA: Insufficient documentation

## 2020-06-11 DIAGNOSIS — E785 Hyperlipidemia, unspecified: Secondary | ICD-10-CM | POA: Insufficient documentation

## 2020-06-11 DIAGNOSIS — Z888 Allergy status to other drugs, medicaments and biological substances status: Secondary | ICD-10-CM | POA: Insufficient documentation

## 2020-06-11 DIAGNOSIS — Z882 Allergy status to sulfonamides status: Secondary | ICD-10-CM | POA: Diagnosis not present

## 2020-06-11 LAB — CUP PACEART REMOTE DEVICE CHECK
Date Time Interrogation Session: 20220308230257
Implantable Pulse Generator Implant Date: 20210505

## 2020-06-16 DIAGNOSIS — I69398 Other sequelae of cerebral infarction: Secondary | ICD-10-CM | POA: Diagnosis not present

## 2020-06-16 DIAGNOSIS — F411 Generalized anxiety disorder: Secondary | ICD-10-CM | POA: Diagnosis not present

## 2020-06-16 DIAGNOSIS — E782 Mixed hyperlipidemia: Secondary | ICD-10-CM | POA: Diagnosis not present

## 2020-06-16 DIAGNOSIS — E1142 Type 2 diabetes mellitus with diabetic polyneuropathy: Secondary | ICD-10-CM | POA: Diagnosis not present

## 2020-06-16 DIAGNOSIS — Z8673 Personal history of transient ischemic attack (TIA), and cerebral infarction without residual deficits: Secondary | ICD-10-CM | POA: Diagnosis not present

## 2020-06-16 DIAGNOSIS — I480 Paroxysmal atrial fibrillation: Secondary | ICD-10-CM | POA: Diagnosis not present

## 2020-06-16 DIAGNOSIS — D6869 Other thrombophilia: Secondary | ICD-10-CM | POA: Diagnosis not present

## 2020-06-16 DIAGNOSIS — I10 Essential (primary) hypertension: Secondary | ICD-10-CM | POA: Diagnosis not present

## 2020-06-16 DIAGNOSIS — Z7901 Long term (current) use of anticoagulants: Secondary | ICD-10-CM | POA: Diagnosis not present

## 2020-06-16 DIAGNOSIS — Z7984 Long term (current) use of oral hypoglycemic drugs: Secondary | ICD-10-CM | POA: Diagnosis not present

## 2020-06-16 DIAGNOSIS — G629 Polyneuropathy, unspecified: Secondary | ICD-10-CM | POA: Diagnosis not present

## 2020-06-16 NOTE — Progress Notes (Signed)
[facility] Summary Report / Loop Recorder 

## 2020-06-22 DIAGNOSIS — I480 Paroxysmal atrial fibrillation: Secondary | ICD-10-CM | POA: Diagnosis not present

## 2020-06-22 DIAGNOSIS — I200 Unstable angina: Secondary | ICD-10-CM | POA: Diagnosis not present

## 2020-06-22 DIAGNOSIS — E1142 Type 2 diabetes mellitus with diabetic polyneuropathy: Secondary | ICD-10-CM | POA: Diagnosis not present

## 2020-06-22 DIAGNOSIS — K219 Gastro-esophageal reflux disease without esophagitis: Secondary | ICD-10-CM | POA: Diagnosis not present

## 2020-06-22 DIAGNOSIS — E782 Mixed hyperlipidemia: Secondary | ICD-10-CM | POA: Diagnosis not present

## 2020-06-22 DIAGNOSIS — I10 Essential (primary) hypertension: Secondary | ICD-10-CM | POA: Diagnosis not present

## 2020-07-14 ENCOUNTER — Ambulatory Visit (INDEPENDENT_AMBULATORY_CARE_PROVIDER_SITE_OTHER): Payer: HMO

## 2020-07-14 DIAGNOSIS — I639 Cerebral infarction, unspecified: Secondary | ICD-10-CM

## 2020-07-16 LAB — CUP PACEART REMOTE DEVICE CHECK
Date Time Interrogation Session: 20220410230404
Implantable Pulse Generator Implant Date: 20210505

## 2020-07-23 DIAGNOSIS — I480 Paroxysmal atrial fibrillation: Secondary | ICD-10-CM | POA: Diagnosis not present

## 2020-07-23 DIAGNOSIS — E1142 Type 2 diabetes mellitus with diabetic polyneuropathy: Secondary | ICD-10-CM | POA: Diagnosis not present

## 2020-07-23 DIAGNOSIS — I200 Unstable angina: Secondary | ICD-10-CM | POA: Diagnosis not present

## 2020-07-23 DIAGNOSIS — K219 Gastro-esophageal reflux disease without esophagitis: Secondary | ICD-10-CM | POA: Diagnosis not present

## 2020-07-23 DIAGNOSIS — I10 Essential (primary) hypertension: Secondary | ICD-10-CM | POA: Diagnosis not present

## 2020-07-23 DIAGNOSIS — E782 Mixed hyperlipidemia: Secondary | ICD-10-CM | POA: Diagnosis not present

## 2020-07-29 NOTE — Progress Notes (Signed)
[facility] Summary Report / Loop Recorder 

## 2020-08-18 ENCOUNTER — Ambulatory Visit (INDEPENDENT_AMBULATORY_CARE_PROVIDER_SITE_OTHER): Payer: HMO

## 2020-08-18 DIAGNOSIS — I639 Cerebral infarction, unspecified: Secondary | ICD-10-CM

## 2020-08-20 DIAGNOSIS — I10 Essential (primary) hypertension: Secondary | ICD-10-CM | POA: Diagnosis not present

## 2020-08-20 DIAGNOSIS — K219 Gastro-esophageal reflux disease without esophagitis: Secondary | ICD-10-CM | POA: Diagnosis not present

## 2020-08-20 DIAGNOSIS — E782 Mixed hyperlipidemia: Secondary | ICD-10-CM | POA: Diagnosis not present

## 2020-08-20 DIAGNOSIS — I480 Paroxysmal atrial fibrillation: Secondary | ICD-10-CM | POA: Diagnosis not present

## 2020-08-20 DIAGNOSIS — E1142 Type 2 diabetes mellitus with diabetic polyneuropathy: Secondary | ICD-10-CM | POA: Diagnosis not present

## 2020-08-21 LAB — CUP PACEART REMOTE DEVICE CHECK
Date Time Interrogation Session: 20220513230533
Implantable Pulse Generator Implant Date: 20210505

## 2020-09-08 ENCOUNTER — Telehealth (HOSPITAL_COMMUNITY): Payer: Self-pay | Admitting: *Deleted

## 2020-09-08 NOTE — Progress Notes (Signed)
[facility] Summary Report / Loop Recorder 

## 2020-09-08 NOTE — Telephone Encounter (Signed)
Pt called in stating 2 times in the last 2 weeks she has noticed blood on her pillowcase upon waking and was concerned since she is on eliquis.  Pt does endorse some slight bleeding with brushing her teeth at times. She did not see blood in her mouth or nose with their instance. She will continue to monitor and call if episodes continue to occur. She will keep her mucous membranes well hydrated.

## 2020-09-11 DIAGNOSIS — Z1231 Encounter for screening mammogram for malignant neoplasm of breast: Secondary | ICD-10-CM | POA: Diagnosis not present

## 2020-09-11 DIAGNOSIS — Z01419 Encounter for gynecological examination (general) (routine) without abnormal findings: Secondary | ICD-10-CM | POA: Diagnosis not present

## 2020-09-11 DIAGNOSIS — M8588 Other specified disorders of bone density and structure, other site: Secondary | ICD-10-CM | POA: Diagnosis not present

## 2020-09-11 DIAGNOSIS — N958 Other specified menopausal and perimenopausal disorders: Secondary | ICD-10-CM | POA: Diagnosis not present

## 2020-09-11 DIAGNOSIS — Z6841 Body Mass Index (BMI) 40.0 and over, adult: Secondary | ICD-10-CM | POA: Diagnosis not present

## 2020-09-15 ENCOUNTER — Ambulatory Visit (INDEPENDENT_AMBULATORY_CARE_PROVIDER_SITE_OTHER): Payer: HMO

## 2020-09-15 DIAGNOSIS — I639 Cerebral infarction, unspecified: Secondary | ICD-10-CM | POA: Diagnosis not present

## 2020-09-16 LAB — CUP PACEART REMOTE DEVICE CHECK
Date Time Interrogation Session: 20220615230211
Implantable Pulse Generator Implant Date: 20210505

## 2020-09-18 DIAGNOSIS — E1142 Type 2 diabetes mellitus with diabetic polyneuropathy: Secondary | ICD-10-CM | POA: Diagnosis not present

## 2020-09-18 DIAGNOSIS — Z794 Long term (current) use of insulin: Secondary | ICD-10-CM | POA: Diagnosis not present

## 2020-09-24 DIAGNOSIS — I480 Paroxysmal atrial fibrillation: Secondary | ICD-10-CM | POA: Diagnosis not present

## 2020-09-24 DIAGNOSIS — E1142 Type 2 diabetes mellitus with diabetic polyneuropathy: Secondary | ICD-10-CM | POA: Diagnosis not present

## 2020-09-24 DIAGNOSIS — I10 Essential (primary) hypertension: Secondary | ICD-10-CM | POA: Diagnosis not present

## 2020-09-24 DIAGNOSIS — K219 Gastro-esophageal reflux disease without esophagitis: Secondary | ICD-10-CM | POA: Diagnosis not present

## 2020-09-24 DIAGNOSIS — E782 Mixed hyperlipidemia: Secondary | ICD-10-CM | POA: Diagnosis not present

## 2020-09-24 DIAGNOSIS — I200 Unstable angina: Secondary | ICD-10-CM | POA: Diagnosis not present

## 2020-10-03 NOTE — Progress Notes (Signed)
[facility] Summary Report / Loop Recorder 

## 2020-10-08 ENCOUNTER — Other Ambulatory Visit (HOSPITAL_COMMUNITY): Payer: Self-pay | Admitting: Physician Assistant

## 2020-10-15 DIAGNOSIS — I10 Essential (primary) hypertension: Secondary | ICD-10-CM | POA: Diagnosis not present

## 2020-10-15 DIAGNOSIS — I480 Paroxysmal atrial fibrillation: Secondary | ICD-10-CM | POA: Diagnosis not present

## 2020-10-15 DIAGNOSIS — K219 Gastro-esophageal reflux disease without esophagitis: Secondary | ICD-10-CM | POA: Diagnosis not present

## 2020-10-15 DIAGNOSIS — E1142 Type 2 diabetes mellitus with diabetic polyneuropathy: Secondary | ICD-10-CM | POA: Diagnosis not present

## 2020-10-15 DIAGNOSIS — E782 Mixed hyperlipidemia: Secondary | ICD-10-CM | POA: Diagnosis not present

## 2020-10-15 DIAGNOSIS — I200 Unstable angina: Secondary | ICD-10-CM | POA: Diagnosis not present

## 2020-10-15 LAB — CUP PACEART REMOTE DEVICE CHECK
Date Time Interrogation Session: 20220718230558
Implantable Pulse Generator Implant Date: 20210505

## 2020-10-20 ENCOUNTER — Ambulatory Visit (INDEPENDENT_AMBULATORY_CARE_PROVIDER_SITE_OTHER): Payer: HMO

## 2020-10-20 DIAGNOSIS — I639 Cerebral infarction, unspecified: Secondary | ICD-10-CM

## 2020-11-11 NOTE — Progress Notes (Signed)
[facility] Summary Report / Loop Recorder 

## 2020-11-17 ENCOUNTER — Telehealth: Payer: Self-pay | Admitting: Cardiology

## 2020-11-17 NOTE — Telephone Encounter (Signed)
*  STAT* If patient is at the pharmacy, call can be transferred to refill team.   1. Which medications need to be refilled? (please list name of each medication and dose if known)  ELIQUIS 5 MG TABS tablet  2. Which pharmacy/location (including street and city if local pharmacy) is medication to be sent to? [facility] - [place], [place] - [place] [address]   3. Do they need a 30 day or 90 day supply? 30 with refills    Patient is [facility]ies [facility]

## 2020-11-18 MED ORDER — APIXABAN 5 MG PO TABS: 5.0000 mg | ORAL_TABLET | Freq: Two times a day (BID) | ORAL | 3 refills | Status: DC

## 2020-11-24 ENCOUNTER — Ambulatory Visit (INDEPENDENT_AMBULATORY_CARE_PROVIDER_SITE_OTHER): Payer: HMO

## 2020-11-24 DIAGNOSIS — I480 Paroxysmal atrial fibrillation: Secondary | ICD-10-CM

## 2020-11-24 DIAGNOSIS — H353131 Nonexudative age-related macular degeneration, bilateral, early dry stage: Secondary | ICD-10-CM | POA: Diagnosis not present

## 2020-11-24 DIAGNOSIS — H5203 Hypermetropia, bilateral: Secondary | ICD-10-CM | POA: Diagnosis not present

## 2020-11-24 DIAGNOSIS — E113393 Type 2 diabetes mellitus with moderate nonproliferative diabetic retinopathy without macular edema, bilateral: Secondary | ICD-10-CM | POA: Diagnosis not present

## 2020-11-24 LAB — CUP PACEART REMOTE DEVICE CHECK
Date Time Interrogation Session: 20220820230548
Implantable Pulse Generator Implant Date: 20210505

## 2020-11-26 DIAGNOSIS — I480 Paroxysmal atrial fibrillation: Secondary | ICD-10-CM | POA: Diagnosis not present

## 2020-11-26 DIAGNOSIS — K219 Gastro-esophageal reflux disease without esophagitis: Secondary | ICD-10-CM | POA: Diagnosis not present

## 2020-11-26 DIAGNOSIS — E11319 Type 2 diabetes mellitus with unspecified diabetic retinopathy without macular edema: Secondary | ICD-10-CM | POA: Diagnosis not present

## 2020-11-26 DIAGNOSIS — I200 Unstable angina: Secondary | ICD-10-CM | POA: Diagnosis not present

## 2020-11-26 DIAGNOSIS — E782 Mixed hyperlipidemia: Secondary | ICD-10-CM | POA: Diagnosis not present

## 2020-11-26 DIAGNOSIS — I10 Essential (primary) hypertension: Secondary | ICD-10-CM | POA: Diagnosis not present

## 2020-11-26 DIAGNOSIS — E1142 Type 2 diabetes mellitus with diabetic polyneuropathy: Secondary | ICD-10-CM | POA: Diagnosis not present

## 2020-12-05 NOTE — Progress Notes (Signed)
[facility] Summary Report / Loop Recorder 

## 2020-12-15 DIAGNOSIS — I69398 Other sequelae of cerebral infarction: Secondary | ICD-10-CM | POA: Diagnosis not present

## 2020-12-15 DIAGNOSIS — Z7984 Long term (current) use of oral hypoglycemic drugs: Secondary | ICD-10-CM | POA: Diagnosis not present

## 2020-12-15 DIAGNOSIS — F411 Generalized anxiety disorder: Secondary | ICD-10-CM | POA: Diagnosis not present

## 2020-12-15 DIAGNOSIS — E782 Mixed hyperlipidemia: Secondary | ICD-10-CM | POA: Diagnosis not present

## 2020-12-15 DIAGNOSIS — E1142 Type 2 diabetes mellitus with diabetic polyneuropathy: Secondary | ICD-10-CM | POA: Diagnosis not present

## 2020-12-15 DIAGNOSIS — Z0000 Encounter for general adult medical examination without abnormal findings: Secondary | ICD-10-CM | POA: Diagnosis not present

## 2020-12-15 DIAGNOSIS — I480 Paroxysmal atrial fibrillation: Secondary | ICD-10-CM | POA: Diagnosis not present

## 2020-12-15 DIAGNOSIS — I10 Essential (primary) hypertension: Secondary | ICD-10-CM | POA: Diagnosis not present

## 2020-12-15 DIAGNOSIS — D6869 Other thrombophilia: Secondary | ICD-10-CM | POA: Diagnosis not present

## 2020-12-15 DIAGNOSIS — Z23 Encounter for immunization: Secondary | ICD-10-CM | POA: Diagnosis not present

## 2020-12-16 ENCOUNTER — Ambulatory Visit (HOSPITAL_COMMUNITY)
Admission: RE | Admit: 2020-12-16 | Discharge: 2020-12-16 | Disposition: A | Discharge status: Home / Self Care | Payer: HMO | Source: Ambulatory Visit | Attending: Physician Assistant | Admitting: Physician Assistant

## 2020-12-16 ENCOUNTER — Other Ambulatory Visit: Payer: Self-pay

## 2020-12-16 ENCOUNTER — Encounter (HOSPITAL_COMMUNITY): Payer: Self-pay | Admitting: Physician Assistant

## 2020-12-16 VITALS — BP 132/70 | HR 58 | Ht 60.0 in | Wt 247.2 lb

## 2020-12-16 DIAGNOSIS — Z7901 Long term (current) use of anticoagulants: Secondary | ICD-10-CM | POA: Insufficient documentation

## 2020-12-16 DIAGNOSIS — E669 Obesity, unspecified: Secondary | ICD-10-CM | POA: Insufficient documentation

## 2020-12-16 DIAGNOSIS — Z8249 Family history of ischemic heart disease and other diseases of the circulatory system: Secondary | ICD-10-CM | POA: Insufficient documentation

## 2020-12-16 DIAGNOSIS — Z882 Allergy status to sulfonamides status: Secondary | ICD-10-CM | POA: Insufficient documentation

## 2020-12-16 DIAGNOSIS — K219 Gastro-esophageal reflux disease without esophagitis: Secondary | ICD-10-CM | POA: Diagnosis not present

## 2020-12-16 DIAGNOSIS — Z888 Allergy status to other drugs, medicaments and biological substances status: Secondary | ICD-10-CM | POA: Insufficient documentation

## 2020-12-16 DIAGNOSIS — Z79899 Other long term (current) drug therapy: Secondary | ICD-10-CM | POA: Insufficient documentation

## 2020-12-16 DIAGNOSIS — I10 Essential (primary) hypertension: Secondary | ICD-10-CM | POA: Insufficient documentation

## 2020-12-16 DIAGNOSIS — E785 Hyperlipidemia, unspecified: Secondary | ICD-10-CM | POA: Insufficient documentation

## 2020-12-16 DIAGNOSIS — I480 Paroxysmal atrial fibrillation: Secondary | ICD-10-CM | POA: Insufficient documentation

## 2020-12-16 DIAGNOSIS — Z794 Long term (current) use of insulin: Secondary | ICD-10-CM | POA: Diagnosis not present

## 2020-12-16 DIAGNOSIS — Z6841 Body Mass Index (BMI) 40.0 and over, adult: Secondary | ICD-10-CM | POA: Diagnosis not present

## 2020-12-16 DIAGNOSIS — D6869 Other thrombophilia: Secondary | ICD-10-CM | POA: Insufficient documentation

## 2020-12-16 DIAGNOSIS — Z833 Family history of diabetes mellitus: Secondary | ICD-10-CM | POA: Insufficient documentation

## 2020-12-16 DIAGNOSIS — Z8673 Personal history of transient ischemic attack (TIA), and cerebral infarction without residual deficits: Secondary | ICD-10-CM | POA: Diagnosis not present

## 2020-12-16 DIAGNOSIS — E119 Type 2 diabetes mellitus without complications: Secondary | ICD-10-CM | POA: Diagnosis not present

## 2020-12-16 LAB — CBC
HCT: 35.5 % — ABNORMAL LOW (ref 36.0–46.0)
Hemoglobin: 11.1 g/dL — ABNORMAL LOW (ref 12.0–15.0)
MCH: 31.6 pg (ref 26.0–34.0)
MCHC: 31.3 g/dL (ref 30.0–36.0)
MCV: 101.1 fL — ABNORMAL HIGH (ref 80.0–100.0)
Platelets: 183 10*3/uL (ref 150–400)
RBC: 3.51 MIL/uL — ABNORMAL LOW (ref 3.87–5.11)
RDW: 14.1 % (ref 11.5–15.5)
WBC: 5.3 10*3/uL (ref 4.0–10.5)
nRBC: 0 % (ref 0.0–0.2)

## 2020-12-16 LAB — BASIC METABOLIC PANEL
Anion gap: 9 (ref 5–15)
BUN: 22 mg/dL (ref 8–23)
CO2: 26 mmol/L (ref 22–32)
Calcium: 9 mg/dL (ref 8.9–10.3)
Chloride: 103 mmol/L (ref 98–111)
Creatinine, Ser: 0.85 mg/dL (ref 0.44–1.00)
GFR, Estimated: >60 mL/min (ref >60)
Glucose, Bld: 108 mg/dL — ABNORMAL HIGH (ref 70–99)
Potassium: 4.4 mmol/L (ref 3.5–5.1)
Sodium: 138 mmol/L (ref 135–145)

## 2020-12-16 NOTE — Progress Notes (Signed)
Primary Care Physician: Orpah Melter, MD Primary Cardiologist: Dr Ellyn Hack Primary Electrophysiologist: Dr Curt Bears Referring Physician: Dr Evie Lacks [name] is a 75 y.o. female with a history of DM, HTN, GERD, HLD, prior CVA, and new onset paroxysmal atrial fibrillation who presents for follow up in the [facility]. 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 The device clinic received an alert for two, 4-minute episodes of afib on 08/06/19. Patient has a CHADS2VASC score of 6. She was unaware of her arrhythmia. She does state that since her stroke she has had issues with balance and nausea. She denies significant alcohol use or snoring.   On follow up today, patient reports that she has done well from a cardiac standpoint. ILR shows 0.1% burden. She did have a mechanical fall about one week ago, no LOC or head trauma.   Today, she denies symptoms of palpitations, chest pain, shortness of breath, orthopnea, PND, lower extremity edema, presyncope, syncope, snoring, daytime somnolence, bleeding. The patient is tolerating medications without difficulties and is otherwise without complaint today.   Atrial Fibrillation Risk Factors:  she does not have symptoms or diagnosis of sleep apnea. she does not have a history of rheumatic fever. she does not have a history of alcohol use. The patient does not have a history of early familial atrial fibrillation or other arrhythmias.  she has a BMI of Body mass index is 48.28 kg/m.Marland Kitchen Filed Weights   12/16/20 0906  Weight: 112.1 kg     Family History  Problem Relation Age of Onset   Diabetes Mother    Hyperlipidemia Mother    Stroke Mother 25   Liver cancer Father    Hypertension Sister    Hyperlipidemia Sister    Diabetes Sister        On insulin   Depression Sister    Kidney disease Brother        After dialysis he had renal transplant   Diabetes  Brother    Coronary artery disease Brother    Peripheral Artery Disease Brother    Stroke Brother 66   Bladder Cancer Brother    Diabetes Brother      Atrial Fibrillation Management history:  Previous antiarrhythmic drugs: none Previous cardioversions: none Previous ablations: none CHADS2VASC score: 6 Anticoagulation history: Eliquis   Past Medical History:  Diagnosis Date   Actinic keratosis    Followed by Dr. Allyson Sabal   Anxiety    Diabetes mellitus, type II, insulin dependent ([place])    Associated with obesity: She takes Lantus, Victoza & Actos   Diplopia    Essential hypertension    GERD (gastroesophageal reflux disease)    Hyperlipidemia    Morbid obesity with BMI of 40.0-44.9, adult [facility])    Past Surgical History:  Procedure Laterality Date   BREAST BIOPSY  1997   LEFT HEART CATH AND CORONARY ANGIOGRAPHY N/A 01/05/2017   Procedure: LEFT HEART CATH AND CORONARY ANGIOGRAPHY;  Surgeon: Leonie Man, MD;  Location: [facility];  Service: Cardiovascular: Angiographically normal coronary arteries with a right dominant system.  Normal EF 55-65%.   LOOP RECORDER INSERTION N/A 08/01/2019   Procedure: LOOP RECORDER INSERTION;  Surgeon: Constance Haw, MD;  Location: [facility];  Service: Cardiovascular;  Laterality: N/A;    Current Outpatient Medications  Medication Sig Dispense Refill   ALPRAZolam (XANAX) 0.25 MG tablet alprazolam 0.25 mg tablet  TAKE 1  TABLET BY MOUTH THREE TIMES DAILY AS NEEDED     apixaban (ELIQUIS) 5 MG TABS tablet Take 1 tablet (5 mg total) by mouth 2 (two) times daily. 60 tablet 3   atorvastatin (LIPITOR) 40 MG tablet Take 1 tablet (40 mg total) by mouth daily. 30 tablet 2   blood glucose meter kit and supplies KIT Dispense based on patient and insurance preference. Use up to four times daily as directed. (FOR ICD-9 250.00, 250.01). 1 each 0   carvedilol (COREG) 12.5 MG tablet Take 1 tablet (12.5 mg total) by mouth 2 (two) times  daily. 180 tablet 2   Insulin Syringe-Needle U-100 (RELION INSULIN SYR .3CC/29G) 29G X 1/2" 0.3 ML MISC 1 each by Does not apply route in the morning and at bedtime. 100 each 0   Lancets (ONETOUCH DELICA PLUS [id number]) MISC USE 1 LANCET TO CHECK GLUCOSE UP TO 4 TIMES DAILY     LANTUS SOLOSTAR 100 UNIT/ML Solostar Pen SMARTSIG:30 Unit(s) SUB-Q Daily     lisinopril-hydrochlorothiazide (ZESTORETIC) 20-25 MG tablet Take 1 tablet by mouth daily.     loratadine (CLARITIN) 10 MG tablet Take 10 mg by mouth daily as needed for allergies, rhinitis or itching.     Naproxen Sod-diphenhydrAMINE 220-25 MG TABS Take 2 tablets by mouth at bedtime as needed (sleep).     nitroGLYCERIN (NITROSTAT) 0.4 MG SL tablet Place 1 tablet (0.4 mg total) under the tongue every 5 (five) minutes as needed for chest pain. 25 tablet 5   ONETOUCH VERIO test strip USE 1 STRIP TO CHECK GLUCOSE UP TO 4 TIMES DAILY AS DIRECTED     pioglitazone (ACTOS) 45 MG tablet pioglitazone 45 mg tablet  TAKE 1 TABLET BY MOUTH ONCE DAILY     RELION PEN NEEDLES 31G X 6 MM MISC USE 1 IN THE MORNING AND AT BEDTIME     VICTOZA 18 MG/3ML SOPN Inject 1.8 mg into the skin daily.     Zinc 50 MG TABS 1 tablet     No current facility-administered medications for this encounter.    Allergies  Allergen Reactions   Livalo [Pitavastatin]     MYALGIAS    Sulfa Antibiotics Itching    Itching and red spots.    Social History   Socioeconomic History   Marital status: Widowed    Spouse name: Not on file   Number of children: 3   Years of education: 17   Highest education level: Not on file  Occupational History    Comment: Retired  Tobacco Use   Smoking status: Never   Smokeless tobacco: Never  Substance and Sexual Activity   Alcohol use: No   Drug use: No   Sexual activity: Not Currently  Other Topics Concern   Not on file  Social History Narrative   She is a widowed mother of 3 with 5 grandchildren. She lives with one of her daughters  and grandson. She is a coming by her sister. She tries to walk, but does not do it regularly.   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   Coffee every morning, tea rare   Right handed    Social Determinants of Health   Financial Resource Strain: Not on file  Food Insecurity: Not on file  Transportation Needs: Not on file  Physical Activity: Not on file  Stress: Not on file  Social Connections: Not on file  Intimate Partner Violence: Not on file     ROS- All systems are  reviewed and negative except as per the HPI above.  Physical Exam: Vitals:   12/16/20 0906  BP: 132/70  Pulse: (!) 58  Weight: 112.1 kg  Height: 5' (1.524 m)    GEN- The patient is a well appearing obese elderly female, alert and oriented x 3 today.   HEENT-head normocephalic, atraumatic, sclera clear, conjunctiva pink, hearing intact, trachea midline. Lungs- Clear to ausculation bilaterally, normal work of breathing Heart- Regular rate and rhythm, no murmurs, rubs or gallops  GI- soft, NT, ND, + BS Extremities- no clubbing, cyanosis, or edema MS- no significant deformity or atrophy Skin- no rash or lesion Psych- euthymic mood, full affect Neuro- strength and sensation are intact   Wt Readings from Last 3 Encounters:  12/16/20 112.1 kg  06/11/20 109.6 kg  12/25/19 105.7 kg    EKG today demonstrates  SB Vent. rate 58 BPM PR interval 162 ms QRS duration 80 ms QT/QTcB 410/402 ms  Echo 08/01/19 demonstrated  1. Left ventricular ejection fraction, by estimation, is 60 to 65%. The  left ventricle has normal function. The left ventricle has no regional  wall motion abnormalities. Left ventricular diastolic parameters are  consistent with Grade I diastolic  dysfunction (impaired relaxation). Elevated left atrial pressure.   2. Right ventricular systolic function is normal. The right ventricular  size is normal. There is normal pulmonary artery systolic pressure. The estimated right  ventricular systolic pressure is 72.2 mmHg.   3. The mitral valve is normal in structure. Trivial mitral valve  regurgitation. No evidence of mitral stenosis.   4. Tricuspid valve regurgitation is moderate.   5. The aortic valve is normal in structure. Aortic valve regurgitation is  not visualized. No aortic stenosis is present.   6. The inferior vena cava is normal in size with greater than 50%  respiratory variability, suggesting right atrial pressure of 3 mmHg.   Conclusion(s)/Recommendation(s): No intracardiac source of embolism  detected on this transthoracic study. A transesophageal echocardiogram is  recommended to exclude cardiac source of embolism if clinically indicated.   Epic records are reviewed at length today  CHA2DS2-VASc Score = 7  The patient's score is based upon: CHF History: 0 HTN History: 1 Diabetes History: 1 Stroke History: 2 Vascular Disease History: 0 Age Score: 2 Gender Score: 1       ASSESSMENT AND PLAN: 1. Paroxysmal Atrial Fibrillation (ICD10:  I48.0) The patient's CHA2DS2-VASc score is 7, indicating a 11.2% annual risk of stroke.   AF burden on ILR is 0.1%, 2 hour episode 11/12/20. Continue Eliquis 5 mg BID Check bmet/cbc Continue Coreg 12.5 mg BID  2. Secondary Hypercoagulable State (ICD10:  D68.69) The patient is at significant risk for stroke/thromboembolism based upon her CHA2DS2-VASc Score of 7.  Continue Apixaban (Eliquis).   3. Obesity Body mass index is 48.28 kg/m. Lifestyle modification was discussed and encouraged including regular physical activity and weight reduction.  4. HTN Stable, no changes today.   Follow up in the AF clinic in one year.    [facility] [address] [place], [place] [address] [phone] 12/16/2020 1:30 PM

## 2020-12-19 ENCOUNTER — Ambulatory Visit (INDEPENDENT_AMBULATORY_CARE_PROVIDER_SITE_OTHER): Payer: HMO

## 2020-12-19 DIAGNOSIS — I639 Cerebral infarction, unspecified: Secondary | ICD-10-CM

## 2020-12-21 LAB — CUP PACEART REMOTE DEVICE CHECK
Date Time Interrogation Session: 20220922230424
Implantable Pulse Generator Implant Date: 20210505

## 2020-12-23 NOTE — Progress Notes (Signed)
[facility] Summary Report / Loop Recorder 

## 2021-01-07 ENCOUNTER — Other Ambulatory Visit: Payer: Self-pay

## 2021-01-07 ENCOUNTER — Ambulatory Visit (HOSPITAL_COMMUNITY)
Admission: RE | Admit: 2021-01-07 | Discharge: 2021-01-07 | Disposition: A | Discharge status: Home / Self Care | Payer: HMO | Source: Ambulatory Visit | Attending: Physician Assistant | Admitting: Physician Assistant

## 2021-01-07 DIAGNOSIS — I480 Paroxysmal atrial fibrillation: Secondary | ICD-10-CM | POA: Diagnosis not present

## 2021-01-07 LAB — CBC
HCT: 34.9 % — ABNORMAL LOW (ref 36.0–46.0)
Hemoglobin: 11.3 g/dL — ABNORMAL LOW (ref 12.0–15.0)
MCH: 32.4 pg (ref 26.0–34.0)
MCHC: 32.4 g/dL (ref 30.0–36.0)
MCV: 100 fL (ref 80.0–100.0)
Platelets: 201 10*3/uL (ref 150–400)
RBC: 3.49 MIL/uL — ABNORMAL LOW (ref 3.87–5.11)
RDW: 14.3 % (ref 11.5–15.5)
WBC: 6.3 10*3/uL (ref 4.0–10.5)
nRBC: 0 % (ref 0.0–0.2)

## 2021-01-21 ENCOUNTER — Ambulatory Visit (INDEPENDENT_AMBULATORY_CARE_PROVIDER_SITE_OTHER): Payer: HMO

## 2021-01-21 DIAGNOSIS — I480 Paroxysmal atrial fibrillation: Secondary | ICD-10-CM | POA: Diagnosis not present

## 2021-01-21 LAB — CUP PACEART REMOTE DEVICE CHECK
Date Time Interrogation Session: 20221025230531
Implantable Pulse Generator Implant Date: 20210505

## 2021-01-26 NOTE — Progress Notes (Signed)
[facility] Summary Report / Loop Recorder 

## 2021-01-30 DIAGNOSIS — I200 Unstable angina: Secondary | ICD-10-CM | POA: Diagnosis not present

## 2021-01-30 DIAGNOSIS — I480 Paroxysmal atrial fibrillation: Secondary | ICD-10-CM | POA: Diagnosis not present

## 2021-01-30 DIAGNOSIS — E1142 Type 2 diabetes mellitus with diabetic polyneuropathy: Secondary | ICD-10-CM | POA: Diagnosis not present

## 2021-01-30 DIAGNOSIS — I10 Essential (primary) hypertension: Secondary | ICD-10-CM | POA: Diagnosis not present

## 2021-01-30 DIAGNOSIS — E782 Mixed hyperlipidemia: Secondary | ICD-10-CM | POA: Diagnosis not present

## 2021-01-30 DIAGNOSIS — K219 Gastro-esophageal reflux disease without esophagitis: Secondary | ICD-10-CM | POA: Diagnosis not present

## 2021-01-30 DIAGNOSIS — E11319 Type 2 diabetes mellitus with unspecified diabetic retinopathy without macular edema: Secondary | ICD-10-CM | POA: Diagnosis not present

## 2021-02-03 ENCOUNTER — Other Ambulatory Visit: Payer: Self-pay | Admitting: Physician Assistant

## 2021-02-23 ENCOUNTER — Ambulatory Visit (INDEPENDENT_AMBULATORY_CARE_PROVIDER_SITE_OTHER): Payer: HMO

## 2021-02-23 DIAGNOSIS — I480 Paroxysmal atrial fibrillation: Secondary | ICD-10-CM

## 2021-02-23 LAB — CUP PACEART REMOTE DEVICE CHECK
Date Time Interrogation Session: 20221127230934
Implantable Pulse Generator Implant Date: 20210505

## 2021-02-24 DIAGNOSIS — H353131 Nonexudative age-related macular degeneration, bilateral, early dry stage: Secondary | ICD-10-CM | POA: Diagnosis not present

## 2021-02-24 DIAGNOSIS — H2513 Age-related nuclear cataract, bilateral: Secondary | ICD-10-CM | POA: Diagnosis not present

## 2021-02-24 DIAGNOSIS — E113393 Type 2 diabetes mellitus with moderate nonproliferative diabetic retinopathy without macular edema, bilateral: Secondary | ICD-10-CM | POA: Diagnosis not present

## 2021-03-03 NOTE — Progress Notes (Signed)
[facility] Summary Report / Loop Recorder 

## 2021-03-31 ENCOUNTER — Ambulatory Visit (INDEPENDENT_AMBULATORY_CARE_PROVIDER_SITE_OTHER): Payer: HMO

## 2021-03-31 DIAGNOSIS — I480 Paroxysmal atrial fibrillation: Secondary | ICD-10-CM

## 2021-03-31 LAB — CUP PACEART REMOTE DEVICE CHECK
Date Time Interrogation Session: 20230102230616
Implantable Pulse Generator Implant Date: 20210505

## 2021-04-08 NOTE — Progress Notes (Signed)
[facility] Summary Report / Loop Recorder 

## 2021-04-28 DIAGNOSIS — K219 Gastro-esophageal reflux disease without esophagitis: Secondary | ICD-10-CM | POA: Diagnosis not present

## 2021-04-28 DIAGNOSIS — E782 Mixed hyperlipidemia: Secondary | ICD-10-CM | POA: Diagnosis not present

## 2021-04-28 DIAGNOSIS — E1142 Type 2 diabetes mellitus with diabetic polyneuropathy: Secondary | ICD-10-CM | POA: Diagnosis not present

## 2021-04-28 DIAGNOSIS — I200 Unstable angina: Secondary | ICD-10-CM | POA: Diagnosis not present

## 2021-04-28 DIAGNOSIS — E11319 Type 2 diabetes mellitus with unspecified diabetic retinopathy without macular edema: Secondary | ICD-10-CM | POA: Diagnosis not present

## 2021-04-28 DIAGNOSIS — I480 Paroxysmal atrial fibrillation: Secondary | ICD-10-CM | POA: Diagnosis not present

## 2021-04-28 DIAGNOSIS — I10 Essential (primary) hypertension: Secondary | ICD-10-CM | POA: Diagnosis not present

## 2021-05-04 ENCOUNTER — Ambulatory Visit (INDEPENDENT_AMBULATORY_CARE_PROVIDER_SITE_OTHER): Payer: HMO

## 2021-05-04 DIAGNOSIS — I480 Paroxysmal atrial fibrillation: Secondary | ICD-10-CM | POA: Diagnosis not present

## 2021-05-04 LAB — CUP PACEART REMOTE DEVICE CHECK
Date Time Interrogation Session: 20230205231130
Implantable Pulse Generator Implant Date: 20210505

## 2021-05-06 NOTE — Progress Notes (Signed)
[facility] Summary Report / Loop Recorder 

## 2021-06-08 ENCOUNTER — Ambulatory Visit (INDEPENDENT_AMBULATORY_CARE_PROVIDER_SITE_OTHER): Payer: HMO

## 2021-06-08 DIAGNOSIS — I639 Cerebral infarction, unspecified: Secondary | ICD-10-CM

## 2021-06-08 LAB — CUP PACEART REMOTE DEVICE CHECK
Date Time Interrogation Session: 20230312231310
Implantable Pulse Generator Implant Date: 20210505

## 2021-06-15 DIAGNOSIS — D6869 Other thrombophilia: Secondary | ICD-10-CM | POA: Diagnosis not present

## 2021-06-15 DIAGNOSIS — Z794 Long term (current) use of insulin: Secondary | ICD-10-CM | POA: Diagnosis not present

## 2021-06-15 DIAGNOSIS — G629 Polyneuropathy, unspecified: Secondary | ICD-10-CM | POA: Diagnosis not present

## 2021-06-15 DIAGNOSIS — I10 Essential (primary) hypertension: Secondary | ICD-10-CM | POA: Diagnosis not present

## 2021-06-15 DIAGNOSIS — I69398 Other sequelae of cerebral infarction: Secondary | ICD-10-CM | POA: Diagnosis not present

## 2021-06-15 DIAGNOSIS — I480 Paroxysmal atrial fibrillation: Secondary | ICD-10-CM | POA: Diagnosis not present

## 2021-06-15 DIAGNOSIS — F411 Generalized anxiety disorder: Secondary | ICD-10-CM | POA: Diagnosis not present

## 2021-06-15 DIAGNOSIS — E1142 Type 2 diabetes mellitus with diabetic polyneuropathy: Secondary | ICD-10-CM | POA: Diagnosis not present

## 2021-06-15 DIAGNOSIS — E782 Mixed hyperlipidemia: Secondary | ICD-10-CM | POA: Diagnosis not present

## 2021-06-18 NOTE — Progress Notes (Signed)
[facility] Summary Report / Loop Recorder 

## 2021-06-23 DIAGNOSIS — H353131 Nonexudative age-related macular degeneration, bilateral, early dry stage: Secondary | ICD-10-CM | POA: Diagnosis not present

## 2021-06-23 DIAGNOSIS — H02831 Dermatochalasis of right upper eyelid: Secondary | ICD-10-CM | POA: Diagnosis not present

## 2021-06-23 DIAGNOSIS — H2513 Age-related nuclear cataract, bilateral: Secondary | ICD-10-CM | POA: Diagnosis not present

## 2021-06-23 DIAGNOSIS — E113293 Type 2 diabetes mellitus with mild nonproliferative diabetic retinopathy without macular edema, bilateral: Secondary | ICD-10-CM | POA: Diagnosis not present

## 2021-06-26 DIAGNOSIS — I10 Essential (primary) hypertension: Secondary | ICD-10-CM | POA: Diagnosis not present

## 2021-06-26 DIAGNOSIS — E1142 Type 2 diabetes mellitus with diabetic polyneuropathy: Secondary | ICD-10-CM | POA: Diagnosis not present

## 2021-06-26 DIAGNOSIS — E782 Mixed hyperlipidemia: Secondary | ICD-10-CM | POA: Diagnosis not present

## 2021-07-13 ENCOUNTER — Ambulatory Visit (INDEPENDENT_AMBULATORY_CARE_PROVIDER_SITE_OTHER): Payer: HMO

## 2021-07-13 DIAGNOSIS — I639 Cerebral infarction, unspecified: Secondary | ICD-10-CM | POA: Diagnosis not present

## 2021-07-13 LAB — CUP PACEART REMOTE DEVICE CHECK
Date Time Interrogation Session: 20230414230859
Implantable Pulse Generator Implant Date: 20210505

## 2021-07-15 DIAGNOSIS — I480 Paroxysmal atrial fibrillation: Secondary | ICD-10-CM | POA: Diagnosis not present

## 2021-07-15 DIAGNOSIS — E782 Mixed hyperlipidemia: Secondary | ICD-10-CM | POA: Diagnosis not present

## 2021-07-15 DIAGNOSIS — I10 Essential (primary) hypertension: Secondary | ICD-10-CM | POA: Diagnosis not present

## 2021-07-15 DIAGNOSIS — E1142 Type 2 diabetes mellitus with diabetic polyneuropathy: Secondary | ICD-10-CM | POA: Diagnosis not present

## 2021-07-15 DIAGNOSIS — K219 Gastro-esophageal reflux disease without esophagitis: Secondary | ICD-10-CM | POA: Diagnosis not present

## 2021-07-21 ENCOUNTER — Ambulatory Visit (INDEPENDENT_AMBULATORY_CARE_PROVIDER_SITE_OTHER): Payer: HMO | Admitting: Cardiology

## 2021-07-21 ENCOUNTER — Encounter: Payer: Self-pay | Admitting: Cardiology

## 2021-07-21 VITALS — BP 138/70 | HR 63 | Ht 62.0 in | Wt 249.0 lb

## 2021-07-21 DIAGNOSIS — E7800 Pure hypercholesterolemia, unspecified: Secondary | ICD-10-CM | POA: Diagnosis not present

## 2021-07-21 DIAGNOSIS — I10 Essential (primary) hypertension: Secondary | ICD-10-CM | POA: Diagnosis not present

## 2021-07-21 DIAGNOSIS — I639 Cerebral infarction, unspecified: Secondary | ICD-10-CM | POA: Diagnosis not present

## 2021-07-21 DIAGNOSIS — D6869 Other thrombophilia: Secondary | ICD-10-CM | POA: Diagnosis not present

## 2021-07-21 DIAGNOSIS — I480 Paroxysmal atrial fibrillation: Secondary | ICD-10-CM

## 2021-07-21 NOTE — Progress Notes (Signed)
Primary Care Provider: Orpah Melter, MD Cardiologist: Glenetta Hew, MD Electrophysiologist: Will Meredith Leeds, MD  Clinic Note: Chief Complaint  Patient presents with   Follow-up    7 months from A-fib clinic, but couple years for me.   Atrial Fibrillation    Noted on ILR.  Has not noticed symptoms.    ===================================  ASSESSMENT/PLAN   Problem List Items Addressed This Visit       Cardiology Problems   Essential hypertension (Chronic)    Blood pressure pretty well controlled on carvedilol 12.5 mg twice daily and lisinopril-HCTZ 20-25 mg daily.  Probably cannot push beta-blocker any further, and not sure spent about a year from increasing the ACE inhibitor dose.  If additional blood pressure required, probably calcium blockers to amiodarone.       Hyperlipidemia (Chronic)    Lipid panel looks great as of September.  She is on 40 mg of atorvastatin.  Was started on Mounjaro for diabetes and weight loss.       Acute cerebrovascular accident of cerebellum ([place])    Patient's CHA2DS2-VASc score.  Therefore on DOAC.       Paroxysmal atrial fibrillation (HCC) - Primary (Chronic)    Completely asymptomatic.  Remains on carvedilol for rate control. On Eliquis with no bleeding issues.       Relevant Orders   EKG 12-Lead (Completed)     Other   Secondary hypercoagulable state ([place])    On DOAC.  Tolerating well.       Morbid obesity ([place]) (Chronic)    Unfortunately, she gained almost all the 48 pounds she lost, saw her back in the last couple years.  Just importance of getting back onto her diet and exercise plan        ===================================  HPI:    [name] is a 76 y.o. female with a PMH notable for DM-2, HTN, HLD with previously cryptogenic CVA (noted to have A-fib on ILR placed 07/31/2019, CHA2DS2-VASc score 7) who presents today for 45-monthfollow-up at the request of MOrpah Melter MD.  I have not  seen her personally since 2019.  CCerise Lieberwas last seen on December 16, 2020 by Mr. Fenton, PA in the A-fib clinic.  Noted to have less than 1% A-fib burden. Stable on carvedilol 12.5 mg twice daily and Eliquis 5 mg twice daily. Plan for annual follow-up  Recent Hospitalizations: None  Reviewed  CV studies:    The following studies were reviewed today: (if available, images/films reviewed: From Epic Chart or Care Everywhere) TTE 08/01/2019:: EF 60 to 65%.  No RWMA.  GR 1 DD.  Elevated LAP.  Normal RV size and function.  Normal mitral valve.  Moderate TR.  Normal aortic valve.  Normal RAP. ILR placed May 2021  Interval History:   CMadeline Johnson today to follow-up with me for the first time in several years.  She is doing pretty well overall from cardiac standpoint with no major issues.  No active symptoms.  She was diagnosed with A-fib from the loop recorder placed at the time of her stroke.  However, she really has not had any symptoms to suggest that she knows that she is in A-fib or not.  Remains on Eliquis without any bleeding issues.  She is on carvedilol for rate control.  She is walking with a cane. Unfortunately, since when I last saw her in November 2020, she is up 45 pounds.  She had lost so much weight and gained it  all back.  CV Review of Symptoms (Summary) Cardiovascular ROS: no chest pain or dyspnea on exertion positive for - -there is some exercise intolerance, but she is very limited mobility walking with a cane. negative for - edema, irregular heartbeat, orthopnea, palpitations, paroxysmal nocturnal dyspnea, rapid heart rate, shortness of breath, or syncope or near syncope, TIA or amaurosis fugax  REVIEWED OF SYSTEMS   Review of Systems  Constitutional:  Negative for malaise/fatigue and weight loss.  Respiratory:  Negative for cough and shortness of breath.   Gastrointestinal:  Negative for blood in stool and melena.  Genitourinary:  Negative for  flank pain and hematuria.  Musculoskeletal:  Positive for joint pain.  Neurological:  Negative for dizziness and focal weakness.  Psychiatric/Behavioral: Negative.     I have reviewed and (if needed) personally updated the patient's problem list, medications, allergies, past medical and surgical history, social and family history.   PAST MEDICAL HISTORY   Past Medical History:  Diagnosis Date   Actinic keratosis    Followed by Dr. Allyson Sabal   Anxiety    Diabetes mellitus, type II, insulin dependent ([place])    Associated with obesity: She takes Lantus, Victoza & Actos   Diplopia    Essential hypertension    GERD (gastroesophageal reflux disease)    Hyperlipidemia    Morbid obesity with BMI of 40.0-44.9, adult ([place])     PAST SURGICAL HISTORY   Past Surgical History:  Procedure Laterality Date   BREAST BIOPSY  1997   LEFT HEART CATH AND CORONARY ANGIOGRAPHY N/A 01/05/2017   Procedure: LEFT HEART CATH AND CORONARY ANGIOGRAPHY;  Surgeon: Leonie Man, MD;  Location: [facility];  Service: Cardiovascular: Angiographically normal coronary arteries with a right dominant system.  Normal EF 55-65%.   LOOP RECORDER INSERTION N/A 08/01/2019   Procedure: LOOP RECORDER INSERTION;  Surgeon: Constance Haw, MD;  Location: [facility];  Service: Cardiovascular;  Laterality: N/A;    Immunization History  Administered Date(s) Administered   Influenza Split 01/29/2003, 12/15/2009, 01/11/2011   Influenza, High Dose Seasonal PF 02/06/2014, 01/14/2016, 01/12/2017, 01/18/2018, 12/08/2018, 12/18/2019   Influenza,inj,quad, With Preservative 12/30/2014   Influenza-Unspecified 01/10/2012, 01/01/2013   Pneumococcal Conjugate-13 07/09/2013   Pneumococcal Polysaccharide-23 10/31/2006, 07/21/2015   Tdap 10/31/2006, 07/18/2017   Zoster, Live 03/04/2009    MEDICATIONS/ALLERGIES   Current Meds  Medication Sig   ALPRAZolam (XANAX) 0.25 MG tablet alprazolam 0.25 mg tablet  TAKE 1  TABLET BY MOUTH THREE TIMES DAILY AS NEEDED   atorvastatin (LIPITOR) 40 MG tablet Take 1 tablet (40 mg total) by mouth daily.   blood glucose meter kit and supplies KIT Dispense based on patient and insurance preference. Use up to four times daily as directed. (FOR ICD-9 250.00, 250.01).   carvedilol (COREG) 12.5 MG tablet Take 1 tablet (12.5 mg total) by mouth 2 (two) times daily.   ELIQUIS 5 MG TABS tablet TAKE ONE TABLET BY MOUTH EVERY MORNING and TAKE ONE TABLET BY MOUTH EVERY EVENING   Lancets (ONETOUCH DELICA PLUS [id number]) MISC USE 1 LANCET TO CHECK GLUCOSE UP TO 4 TIMES DAILY   LANTUS SOLOSTAR 100 UNIT/ML Solostar Pen SMARTSIG:30 Unit(s) SUB-Q Daily   lisinopril-hydrochlorothiazide (ZESTORETIC) 20-25 MG tablet Take 1 tablet by mouth daily.   loratadine (CLARITIN) 10 MG tablet Take 10 mg by mouth daily as needed for allergies, rhinitis or itching.   Naproxen Sod-diphenhydrAMINE 220-25 MG TABS Take 2 tablets by mouth at bedtime as needed (sleep).   nitroGLYCERIN (NITROSTAT)  0.4 MG SL tablet Place 1 tablet (0.4 mg total) under the tongue every 5 (five) minutes as needed for chest pain.   pioglitazone (ACTOS) 45 MG tablet pioglitazone 45 mg tablet  TAKE 1 TABLET BY MOUTH ONCE DAILY   VICTOZA 18 MG/3ML SOPN Inject 1.8 mg into the skin daily.   Zinc 50 MG TABS 1 tablet    Allergies  Allergen Reactions   Livalo [Pitavastatin]     MYALGIAS    Sulfa Antibiotics Itching    Itching and red spots.    SOCIAL HISTORY/FAMILY HISTORY   Reviewed in Epic:  Pertinent findings:  Social History   Tobacco Use   Smoking status: Never   Smokeless tobacco: Never  Substance Use Topics   Alcohol use: No   Drug use: No   Social History   Social History Narrative   She is a widowed mother of 3 with 5 grandchildren. She lives with one of her daughters and grandson. She is a coming by her sister. She tries to walk, but does not do it regularly.   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   Coffee every morning, tea rare   Right handed     OBJCTIVE -PE, EKG, labs   Wt Readings from Last 3 Encounters:  08/25/21 249 lb (112.9 kg)  08/11/21 249 lb (112.9 kg)  08/06/21 249 lb (112.9 kg)    Physical Exam: BP 138/70   Pulse 63   Ht 5' 2" (1.575 m)   Wt 249 lb (112.9 kg)   SpO2 98%   BMI 45.54 kg/m  Physical Exam Constitutional:      General: She is not in acute distress.    Appearance: Normal appearance. She is not ill-appearing or toxic-appearing.     Comments: Morbidly obese-notably heavier  HENT:     Head: Normocephalic and atraumatic.  Neck:     Vascular: No carotid bruit or JVD.  Cardiovascular:     Rate and Rhythm: Normal rate and regular rhythm. No extrasystoles are present.    Chest Wall: PMI is not displaced (Unable to palpate due to obesity).     Heart sounds: S1 normal and S2 normal. Heart sounds are distant. No murmur heard.   No friction rub. No gallop.  Pulmonary:     Effort: Pulmonary effort is normal. No respiratory distress.     Breath sounds: Normal breath sounds. No wheezing, rhonchi or rales.     Comments: Just distant breath sounds sounds Musculoskeletal:     Cervical back: Normal range of motion and neck supple.  Skin:    General: Skin is warm and dry.  Neurological:     General: No focal deficit present.     Mental Status: She is alert and oriented to person, place, and time.     Motor: No weakness.     Gait: Gait normal.  Psychiatric:        Mood and Affect: Mood normal.        Behavior: Behavior normal.        Thought Content: Thought content normal.        Judgment: Judgment normal.     Adult ECG Report  Rate: 63;  Rhythm: normal sinus rhythm; normal axis, intervals and durations.  Narrative Interpretation: Normal  Recent Labs:   12/15/2020: TC 106, TG 88, LDL 53, HDL 36. Lab Results  Component Value Date   CHOL 193 08/01/2019   HDL 56 08/01/2019   LDLCALC 118 (H)  08/01/2019   TRIG 97 08/01/2019   CHOLHDL  3.4 08/01/2019   Lab Results  Component Value Date   CREATININE 0.85 12/16/2020   BUN 22 12/16/2020   NA 138 12/16/2020   K 4.4 12/16/2020   CL 103 12/16/2020   CO2 26 12/16/2020      Latest Ref Rng & Units 01/07/2021    9:30 AM 12/16/2020    9:20 AM 09/11/2019    9:32 AM  CBC  WBC 4.0 - 10.5 K/uL 6.3   5.3   9.1    Hemoglobin 12.0 - 15.0 g/dL 11.3   11.1   14.3    Hematocrit 36.0 - 46.0 % 34.9   35.5   44.2    Platelets 150 - 400 K/uL 201   183   219      Lab Results  Component Value Date   HGBA1C 10.4 (H) 08/01/2019  06/15/2021-A1c 6.6. No results found for: TSH  ==================================================  COVID-19 Education: The signs and symptoms of COVID-19 were discussed with the patient and how to seek care for testing (follow up with PCP or arrange E-visit).    I spent a total of 18 minutes with the patient spent in direct patient consultation.  Additional time spent with chart review  / charting (studies, outside notes, etc): 15 min Total Time: 33 min  Current medicines are reviewed at length with the patient today.  (+/- concerns) none  This visit occurred during the SARS-CoV-2 public health emergency.  Safety protocols were in place, including screening questions prior to the visit, additional usage of staff PPE, and extensive cleaning of exam room while observing appropriate contact time as indicated for disinfecting solutions.  Notice: This dictation was prepared with Dragon dictation along with smart phrase technology. Any transcriptional errors that result from this process are unintentional and may not be corrected upon review.  Studies Ordered:   Orders Placed This Encounter  Procedures   EKG 12-Lead   No orders of the defined types were placed in this encounter.   Patient Instructions / Medication Changes & Studies & Tests Ordered   Patient Instructions  Medication Instructions:  No changes *If you need a refill on your cardiac  medications before your next appointment, please call your pharmacy*   Lab Work: Not needed    Testing/Procedures: Not needed   Follow-Up: At [facility], you and your health needs are our priority.  As part of our continuing mission to provide you with exceptional heart care, we have created designated Provider Care Teams.  These Care Teams include your primary Cardiologist (physician) and Advanced Practice Providers (APPs -  Physician Assistants and Nurse Practitioners) who all work together to provide you with the care you need, when you need it.  We recommend signing up for the patient portal called "MyChart".  Sign up information is provided on this After Visit Summary.  MyChart is used to connect with patients for Virtual Visits (Telemedicine).  Patients are able to view lab/test results, encounter notes, upcoming appointments, etc.  Non-urgent messages can be sent to your provider as well.   To learn more about what you can do with MyChart, go to [email or web address].    Your next appointment:   12 month(s)  The format for your next appointment:   In Person  Provider:   Glenetta Hew, MD     Other Instructions  Handicapped placard  completed   Important Information About Sugar  Glenetta Hew, M.D., M.S. Interventional Cardiologist   Pager # [phone] Phone # [phone] [address] [place], [place] [address]   Thank you for choosing Heartcare at [facility]!!

## 2021-07-21 NOTE — Patient Instructions (Signed)
Medication Instructions:  ?No changes ?*If you need a refill on your cardiac medications before your next appointment, please call your pharmacy* ? ? ?Lab Work: ?Not needed ? ? ? ?Testing/Procedures: ?Not needed ? ? ?Follow-Up: ?At [facility], you and your health needs are our priority.  As part of our continuing mission to provide you with exceptional heart care, we have created designated Provider Care Teams.  These Care Teams include your primary Cardiologist (physician) and Advanced Practice Providers (APPs -  Physician Assistants and Nurse Practitioners) who all work together to provide you with the care you need, when you need it. ? ?We recommend signing up for the patient portal called "MyChart".  Sign up information is provided on this After Visit Summary.  MyChart is used to connect with patients for Virtual Visits (Telemedicine).  Patients are able to view lab/test results, encounter notes, upcoming appointments, etc.  Non-urgent messages can be sent to your provider as well.   ?To learn more about what you can do with MyChart, go to [email or web address].   ? ?Your next appointment:   ?12 month(s) ? ?The format for your next appointment:   ?In Person ? ?Provider:   ?Glenetta Hew, MD   ? ? ?Other Instructions ? Handicapped placard  completed  ? ?Important Information About Sugar ? ? ? ? ?  ?

## 2021-07-28 DIAGNOSIS — E1142 Type 2 diabetes mellitus with diabetic polyneuropathy: Secondary | ICD-10-CM | POA: Diagnosis not present

## 2021-07-28 DIAGNOSIS — I480 Paroxysmal atrial fibrillation: Secondary | ICD-10-CM | POA: Diagnosis not present

## 2021-07-28 DIAGNOSIS — E782 Mixed hyperlipidemia: Secondary | ICD-10-CM | POA: Diagnosis not present

## 2021-07-28 DIAGNOSIS — I10 Essential (primary) hypertension: Secondary | ICD-10-CM | POA: Diagnosis not present

## 2021-07-28 DIAGNOSIS — K219 Gastro-esophageal reflux disease without esophagitis: Secondary | ICD-10-CM | POA: Diagnosis not present

## 2021-07-29 NOTE — Progress Notes (Signed)
[facility] Summary Report / Loop Recorder 

## 2021-08-06 ENCOUNTER — Encounter: Payer: Self-pay | Admitting: Emergency Medicine

## 2021-08-06 ENCOUNTER — Ambulatory Visit
Admission: EM | Admit: 2021-08-06 | Discharge: 2021-08-06 | Disposition: A | Discharge status: Home / Self Care | Payer: HMO | Attending: Family Medicine | Admitting: Family Medicine

## 2021-08-06 ENCOUNTER — Ambulatory Visit (INDEPENDENT_AMBULATORY_CARE_PROVIDER_SITE_OTHER): Payer: HMO

## 2021-08-06 ENCOUNTER — Other Ambulatory Visit: Payer: Self-pay

## 2021-08-06 DIAGNOSIS — S52501A Unspecified fracture of the lower end of right radius, initial encounter for closed fracture: Secondary | ICD-10-CM | POA: Diagnosis not present

## 2021-08-06 DIAGNOSIS — M25531 Pain in right wrist: Secondary | ICD-10-CM

## 2021-08-06 DIAGNOSIS — S82002A Unspecified fracture of left patella, initial encounter for closed fracture: Secondary | ICD-10-CM | POA: Diagnosis not present

## 2021-08-06 DIAGNOSIS — M25562 Pain in left knee: Secondary | ICD-10-CM

## 2021-08-06 MED ORDER — TRAMADOL HCL 50 MG PO TABS: 50.0000 mg | ORAL_TABLET | Freq: Two times a day (BID) | ORAL | 0 refills | Status: DC | PRN

## 2021-08-06 NOTE — ED Notes (Signed)
Radial gutter applied to right arm. Webroll applied to casting material prior to application. Gauze placed between index finger and middle finger.pt tolerated well. ? ?Posterior long leg splint with stirrup applied. Webroll applied to casting material prior to application. Gauze placed between all toes on left foot. Pt tolerated well. ? ?Cast management and follow-up discussed.Pt and pt family verbalized understanding. ?

## 2021-08-06 NOTE — ED Triage Notes (Addendum)
Pt reports slipped and tripped this morning and reports left knee, right wrist/forearm pain. No obvious deformity noted. Pt reports also hit chin on potted plant on the way down. Pt reports is on blood thinner but denies loc. Moderate contusion noted to chin, mild contusion noted to left knee. ? ?Pt reports uses a cane at baseline and reports my balance has been off since past strokes a few years ago. ?

## 2021-08-10 NOTE — ED Provider Notes (Signed)
?[facility] ? ? ? ?CSN: [id number] ?Arrival date & time: 08/06/21  1418 ? ? ?  ? ?History   ?Chief Complaint ?Chief Complaint  ?Patient presents with  ? Fall  ? ? ?HPI ?[name] is a 76 y.o. female.  ? ?Presenting today with left knee and right wrist pain after a fall this morning where she tripped and fell onto the knee, catching herself on the right hand.  She did bump her chin during the fall and has some bruising in this area but did not hit her head or lose consciousness during fall.  She has decreased range of motion in both of these areas though no numbness, tingling, discoloration, significant skin injury.  Has not yet tried anything over-the-counter for symptoms as the fall just occurred about an hour or so ago.  She is on anticoagulation.  Denies subsequent dizziness, severe headache, loss of vision, bleeding issues. ? ? ?Past Medical History:  ?Diagnosis Date  ? Actinic keratosis   ? Followed by Dr. Allyson Sabal  ? Anxiety   ? Diabetes mellitus, type II, insulin dependent ([place])   ? Associated with obesity: She takes Lantus, Victoza & Actos  ? Diplopia   ? Essential hypertension   ? GERD (gastroesophageal reflux disease)   ? Hyperlipidemia   ? Morbid obesity with BMI of 40.0-44.9, adult ([place])   ? ? ?Patient Active Problem List  ? Diagnosis Date Noted  ? Paroxysmal atrial fibrillation ([place]) 08/08/2019  ? Secondary hypercoagulable state ([place]) 08/08/2019  ? 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 GERD (gastroesophageal reflux disease)   ? Leukocytosis   ? Morbid obesity ([place]) 01/25/2018  ? Chest pain with low risk for cardiac etiology 12/31/2016  ? Essential hypertension 12/31/2016  ? Hyperlipidemia 12/31/2016  ? DM (diabetes mellitus), type 2 with complications ([place]) 82/50/5397  ? ? ?Past Surgical History:  ?Procedure Laterality Date  ? BREAST BIOPSY  1997  ? LEFT HEART CATH AND CORONARY ANGIOGRAPHY N/A 01/05/2017  ? Procedure: LEFT HEART CATH AND CORONARY ANGIOGRAPHY;   Surgeon: Leonie Man, MD;  Location: [facility];  Service: Cardiovascular: Angiographically normal coronary arteries with a right dominant system.  Normal EF 55-65%.  ? LOOP RECORDER INSERTION N/A 08/01/2019  ? Procedure: LOOP RECORDER INSERTION;  Surgeon: Constance Haw, MD;  Location: [facility];  Service: Cardiovascular;  Laterality: N/A;  ? ? ?OB History   ?No obstetric history on file. ?  ? ? ? ?Home Medications   ? ?Prior to Admission medications   ?Medication Sig Start Date End Date Taking? Authorizing Provider  ?traMADol (ULTRAM) 50 MG tablet Take 1 tablet (50 mg total) by mouth every 12 (twelve) hours as needed. 08/06/21  Yes Volney American, PA-C  ?ALPRAZolam (XANAX) 0.25 MG tablet alprazolam 0.25 mg tablet ? TAKE 1 TABLET BY MOUTH THREE TIMES DAILY AS NEEDED    [provider]  ?atorvastatin (LIPITOR) 40 MG tablet Take 1 tablet (40 mg total) by mouth daily. 08/03/19 07/21/21  Barb Merino, MD  ?blood glucose meter kit and supplies KIT Dispense based on patient and insurance preference. Use up to four times daily as directed. (FOR ICD-9 250.00, 250.01). 08/02/19   Barb Merino, MD  ?carvedilol (COREG) 12.5 MG tablet Take 1 tablet (12.5 mg total) by mouth 2 (two) times daily. 11/16/19   Camnitz, Ocie Doyne, MD  ?Arne [place] 5 MG TABS tablet TAKE ONE TABLET BY MOUTH EVERY MORNING and TAKE ONE TABLET BY MOUTH  EVERY EVENING 02/03/21   Fenton, Clint R, PA  ?Insulin Syringe-Needle U-100 (RELION INSULIN SYR .3CC/29G) 29G X 1/2" 0.3 ML MISC 1 each by Does not apply route in the morning and at bedtime. 08/02/19   Barb Merino, MD  ?Lancets (ONETOUCH DELICA PLUS [id number]) [place] USE 1 LANCET TO CHECK GLUCOSE UP TO 4 TIMES DAILY 08/02/19   [provider]  ?LANTUS SOLOSTAR 100 UNIT/ML Solostar Pen SMARTSIG:30 Unit(s) SUB-Q Daily 11/12/19   [provider]  ?lisinopril-hydrochlorothiazide (ZESTORETIC) 20-25 MG tablet Take 1 tablet by mouth daily. 09/17/19   [provider]  ?loratadine (CLARITIN) 10 MG tablet Take 10 mg by mouth daily as needed for allergies, rhinitis or itching.    [provider]  ?Naproxen Sod-diphenhydrAMINE 220-25 MG TABS Take 2 tablets by mouth at bedtime as needed (sleep).    [provider]  ?nitroGLYCERIN (NITROSTAT) 0.4 MG SL tablet Place 1 tablet (0.4 mg total) under the tongue every 5 (five) minutes as needed for chest pain. 12/31/16 07/21/21  Leonie Man, MD  ?Glory Rosebush VERIO test strip USE 1 STRIP TO CHECK GLUCOSE UP TO 4 TIMES DAILY AS DIRECTED 08/02/19   [provider]  ?pioglitazone (ACTOS) 45 MG tablet pioglitazone 45 mg tablet ? TAKE 1 TABLET BY MOUTH ONCE DAILY    [provider]  ?RELION PEN NEEDLES 31G X 6 MM MISC USE 1 IN THE MORNING AND AT BEDTIME 08/02/19   [provider]  ?VICTOZA 18 MG/3ML SOPN Inject 1.8 mg into the skin daily. 05/24/20   [provider]  ?Zinc 50 MG TABS 1 tablet    [provider]  ? ? ?Family History ?Family History  ?Problem Relation Age of Onset  ? Diabetes Mother   ? Hyperlipidemia Mother   ? Stroke Mother 53  ? Liver cancer Father   ? Hypertension Sister   ? Hyperlipidemia Sister   ? Diabetes Sister   ?     On insulin  ? Depression Sister   ? Kidney disease Brother   ?     After dialysis he had renal transplant  ? Diabetes Brother   ? Coronary artery disease Brother   ? Peripheral Artery Disease Brother   ? Stroke Brother 58  ? Bladder Cancer Brother   ? Diabetes Brother   ? ? ?Social History ?Social History  ? ?Tobacco Use  ? Smoking status: Never  ? Smokeless tobacco: Never  ?Substance Use Topics  ? Alcohol use: No  ? Drug use: No  ? ? ? ?Allergies   ?Livalo [pitavastatin] and Sulfa antibiotics ? ? ?Review of Systems ?Review of Systems ?Per HPI ? ?Physical Exam ?Triage Vital Signs ?ED Triage Vitals  ?Enc Vitals Group  ?   BP 08/06/21 1433 (!) 154/74  ?   Pulse Rate 08/06/21 1433 62  ?   Resp 08/06/21 1433 18  ?   Temp 08/06/21 1433 (!)  97.5 ?F (36.4 ?C)  ?   Temp Source 08/06/21 1433 Oral  ?   SpO2 08/06/21 1433 100 %  ?   Weight 08/06/21 1434 249 lb (112.9 kg)  ?   Height 08/06/21 1434 $RemoveBefor'5\' 2"'UngcRVEbQvYw$  (1.575 m)  ?   Head Circumference --   ?   Peak Flow --   ?   Pain Score 08/06/21 1434 8  ?   Pain Loc --   ?   Pain Edu? --   ?   Excl. in [place]? --   ? ?No  data found. ? ?Updated Vital Signs ?BP (!) 154/74 (BP Location: Right Arm)   Pulse 62   Temp (!) 97.5 ?F (36.4 ?C) (Oral)   Resp 18   Ht 5' 2" (1.575 m)   Wt 249 lb (112.9 kg)   SpO2 100%   BMI 45.54 kg/m?  ? ?Visual Acuity ?Right Eye Distance:   ?Left Eye Distance:   ?Bilateral Distance:   ? ?Right Eye Near:   ?Left Eye Near:    ?Bilateral Near:    ? ?Physical Exam ?Vitals and nursing note reviewed.  ?Constitutional:   ?   Appearance: Normal appearance. She is not ill-appearing.  ?HENT:  ?   Head: Atraumatic.  ?Eyes:  ?   Extraocular Movements: Extraocular movements intact.  ?   Conjunctiva/sclera: Conjunctivae normal.  ?Cardiovascular:  ?   Rate and Rhythm: Normal rate and regular rhythm.  ?   Heart sounds: Normal heart sounds.  ?Pulmonary:  ?   Effort: Pulmonary effort is normal.  ?   Breath sounds: Normal breath sounds.  ?Musculoskeletal:     ?   General: Swelling, tenderness and signs of injury present.  ?   Cervical back: Normal range of motion and neck supple.  ?   Comments: Right radial aspect of wrist edematous, significantly tender to palpation without obvious bony defect on palpation.  Decreased range of motion to the right wrist.  Left anterior knee tender to palpation with bruising, decreased range of motion due to pain.  Patient in a wheelchair today due to injuries, ambulatory at baseline  ?Skin: ?   General: Skin is warm and dry.  ?   Findings: Bruising present.  ?Neurological:  ?   Mental Status: She is alert and oriented to person, place, and time.  ?   Sensory: No sensory deficit.  ?   Motor: No weakness.  ?   Coordination: Coordination normal.  ?   Gait: Gait normal.  ?    Comments: All 4 extremities neurovascularly intact  ?Psychiatric:     ?   Mood and Affect: Mood normal.     ?   Thought Content: Thought content normal.     ?   Judgment: Judgment normal.  ? ?UC Treatments / Results  ?[place]

## 2021-08-11 ENCOUNTER — Ambulatory Visit (INDEPENDENT_AMBULATORY_CARE_PROVIDER_SITE_OTHER): Payer: HMO

## 2021-08-11 ENCOUNTER — Ambulatory Visit: Payer: HMO | Admitting: Orthopedic Surgery

## 2021-08-11 ENCOUNTER — Encounter: Payer: Self-pay | Admitting: Orthopedic Surgery

## 2021-08-11 VITALS — Ht 62.0 in | Wt 249.0 lb

## 2021-08-11 DIAGNOSIS — S82002A Unspecified fracture of left patella, initial encounter for closed fracture: Secondary | ICD-10-CM | POA: Diagnosis not present

## 2021-08-11 DIAGNOSIS — S82092A Other fracture of left patella, initial encounter for closed fracture: Secondary | ICD-10-CM

## 2021-08-11 DIAGNOSIS — S52531A Colles' fracture of right radius, initial encounter for closed fracture: Secondary | ICD-10-CM

## 2021-08-11 DIAGNOSIS — M25562 Pain in left knee: Secondary | ICD-10-CM

## 2021-08-11 MED ORDER — GABAPENTIN 100 MG PO CAPS: 100.0000 mg | ORAL_CAPSULE | Freq: Three times a day (TID) | ORAL | 0 refills | Status: DC

## 2021-08-11 MED ORDER — HYDROCODONE-ACETAMINOPHEN 5-325 MG PO TABS: 1.0000 | ORAL_TABLET | Freq: Four times a day (QID) | ORAL | 0 refills | Status: DC | PRN

## 2021-08-11 NOTE — Patient Instructions (Signed)

## 2021-08-11 NOTE — Progress Notes (Signed)
New Patient Visit ? ?Assessment: ?[name] is a 76 y.o. female with the following: ?1. Other closed fracture of left patella, initial encounter ?2. Closed Colles' fracture of right radius, initial encounter ? ?Plan: ?[name] sustained a couple of injuries in a recent fall.  She has bruising and mild swelling over the front of her knee.  She is able to actively extend her knee.  Ideally, she remains in a knee immobilizer while the bony injury heals.  However, we do not have a brace that will fit her leg.  We provided a prescription for a leg brace, and she is to remain nonweightbearing.  We also provided her with a prescription for an oversized wheelchair, as well as a bedside commode. ? ?Regarding her right wrist, she has some dorsal angulation of the joint line.  However, I think this is amenable to nonoperative management.  She is in agreement with this plan.  Unfortunately, while wearing the previous splint, she has developed some numbness in the median nerve distribution.  Upon removal in the clinic today, there was significant improvement in the numbness to the index finger.  However, there was little change in the short period of time and the numbness to her right thumb.  She was placed in a cast, which was in nice neutral position in clinic today.  I have asked her to monitor the numbness.  I will touch base with her within the next 24-48 hours specifically regarding the numbness. ? ? ?Cast application - right short arm cast ?  ?Verbal consent was obtained and the correct extremity was identified. ?A well padded, appropriately molded short arm cast was applied to the right arm ?Fingers remained warm and well perfused.   ?There were no sharp edges ?Patient tolerated the procedure well ?Cast care instructions were provided  ? ? ?Follow-up: ?Return in about 2 weeks (around 08/25/2021). ? ?Subjective: ? ?Chief Complaint  ?Patient presents with  ? Knee Injury  ?  LT knee DOI 08/06/21   ? ? ?History of Present Illness: ?[name] is a 76 y.o. female who presents for evaluation of left knee and right wrist pain.  She states that she was at her garden club a few days ago, when she lost her balance and fell.  She landed on her left knee.  She also reached out with her right hand.  She has pain in both spots.  She was seen in an urgent care center.  She was placed in a long-leg splint, but this is been very uncomfortable.  She notes some irritation of the skin in the posterior thigh.  She has also been in a right wrist splint, and this has shifted.  She notes numbness and tingling in the index finger, as well as the thumb.  She has still having some issues with pain control otherwise. ? ? ?Review of Systems: ?No fevers or chills ?+ Numbness and tingling. ?No chest pain ?No shortness of breath ?No bowel or bladder dysfunction ?No GI distress ?No headaches ? ? ?Medical History: ? ?Past Medical History:  ?Diagnosis Date  ? Actinic keratosis   ? Followed by Dr. Allyson Sabal  ? Anxiety   ? Diabetes mellitus, type II, insulin dependent ([place])   ? Associated with obesity: She takes Lantus, Victoza & Actos  ? Diplopia   ? Essential hypertension   ? GERD (gastroesophageal reflux disease)   ? Hyperlipidemia   ? Morbid obesity with BMI of 40.0-44.9, adult ([place])   ? ? ?Past  Surgical History:  ?Procedure Laterality Date  ? BREAST BIOPSY  1997  ? LEFT HEART CATH AND CORONARY ANGIOGRAPHY N/A 01/05/2017  ? Procedure: LEFT HEART CATH AND CORONARY ANGIOGRAPHY;  Surgeon: Leonie Man, MD;  Location: [facility];  Service: Cardiovascular: Angiographically normal coronary arteries with a right dominant system.  Normal EF 55-65%.  ? LOOP RECORDER INSERTION N/A 08/01/2019  ? Procedure: LOOP RECORDER INSERTION;  Surgeon: Constance Haw, MD;  Location: [facility];  Service: Cardiovascular;  Laterality: N/A;  ? ? ?Family History  ?Problem Relation Age of Onset  ? Diabetes Mother   ? Hyperlipidemia  Mother   ? Stroke Mother 39  ? Liver cancer Father   ? Hypertension Sister   ? Hyperlipidemia Sister   ? Diabetes Sister   ?     On insulin  ? Depression Sister   ? Kidney disease Brother   ?     After dialysis he had renal transplant  ? Diabetes Brother   ? Coronary artery disease Brother   ? Peripheral Artery Disease Brother   ? Stroke Brother 3  ? Bladder Cancer Brother   ? Diabetes Brother   ? ?Social History  ? ?Tobacco Use  ? Smoking status: Never  ? Smokeless tobacco: Never  ?Substance Use Topics  ? Alcohol use: No  ? Drug use: No  ? ? ?Allergies  ?Allergen Reactions  ? Livalo [Pitavastatin]   ?  MYALGIAS ?  ? Sulfa Antibiotics Itching  ?  Itching and red spots.  ? ? ?Current Meds  ?Medication Sig  ? gabapentin (NEURONTIN) 100 MG capsule Take 1 capsule (100 mg total) by mouth 3 (three) times daily.  ? HYDROcodone-acetaminophen (NORCO/VICODIN) 5-325 MG tablet Take 1 tablet by mouth every 6 (six) hours as needed for moderate pain.  ? ? ?Objective: ?Ht '5\' 2"'$  (1.575 m)   Wt 249 lb (112.9 kg)   BMI 45.54 kg/m?  ? ?Physical Exam: ? ?General: Alert and oriented., No acute distress., Seated in a wheelchair., and Obese female. ?Gait: Unable to ambulate. ? ?Right wrist with some bruising and swelling around the wrist.  Fingers are warm and well-perfused.  Decree sensation in the index finger.  Dense numbness in the right thumb.  2+ radial pulse.  No skin breakdown. ? ?Evaluation of left knee demonstrates ecchymosis directly over the patella.  Mild tenderness to palpation in this area.  Actively, she is able to maintain a straight leg raise.  She has some mild skin irritation in the posterior aspect of the left thigh.  Toes are warm and well-perfused. ? ? ? ?IMAGING: ?I personally ordered and reviewed the following images ? ?Single lateral view of the left knee was obtained in clinic today.  There is a minimally displaced, partial-thickness fracture of the patella which is identifiable at the distal one third of the  patella.  There is minimal displacement.  No evidence of extensor mechanism disruption. ? ?Impression: Minimally displaced, partial-thickness fracture of the distal patella. ? ?X-rays previously obtained demonstrates a right distal radius fracture, with mild dorsal angulation of the joint line. ? ? ?New Medications:  ?Meds ordered this encounter  ?Medications  ? gabapentin (NEURONTIN) 100 MG capsule  ?  Sig: Take 1 capsule (100 mg total) by mouth 3 (three) times daily.  ?  Dispense:  90 capsule  ?  Refill:  0  ? HYDROcodone-acetaminophen (NORCO/VICODIN) 5-325 MG tablet  ?  Sig: Take 1 tablet by mouth every 6 (  six) hours as needed for moderate pain.  ?  Dispense:  10 tablet  ?  Refill:  0  ? ? ? ? ?Mordecai Rasmussen, MD ? ?08/11/2021 ?9:53 PM ? ? ?

## 2021-08-17 ENCOUNTER — Ambulatory Visit (INDEPENDENT_AMBULATORY_CARE_PROVIDER_SITE_OTHER): Payer: HMO

## 2021-08-17 DIAGNOSIS — I480 Paroxysmal atrial fibrillation: Secondary | ICD-10-CM | POA: Diagnosis not present

## 2021-08-19 LAB — CUP PACEART REMOTE DEVICE CHECK
Date Time Interrogation Session: 20230524090154
Implantable Pulse Generator Implant Date: 20210505

## 2021-08-25 ENCOUNTER — Ambulatory Visit (INDEPENDENT_AMBULATORY_CARE_PROVIDER_SITE_OTHER): Payer: HMO | Admitting: Orthopedic Surgery

## 2021-08-25 ENCOUNTER — Ambulatory Visit (INDEPENDENT_AMBULATORY_CARE_PROVIDER_SITE_OTHER): Payer: HMO

## 2021-08-25 ENCOUNTER — Encounter: Payer: Self-pay | Admitting: Orthopedic Surgery

## 2021-08-25 VITALS — Ht 62.0 in | Wt 249.0 lb

## 2021-08-25 DIAGNOSIS — S82092D Other fracture of left patella, subsequent encounter for closed fracture with routine healing: Secondary | ICD-10-CM | POA: Diagnosis not present

## 2021-08-25 DIAGNOSIS — S52531D Colles' fracture of right radius, subsequent encounter for closed fracture with routine healing: Secondary | ICD-10-CM | POA: Diagnosis not present

## 2021-08-25 MED ORDER — TRAMADOL HCL 50 MG PO TABS: 50.0000 mg | ORAL_TABLET | Freq: Two times a day (BID) | ORAL | 0 refills | Status: DC | PRN

## 2021-08-25 NOTE — Progress Notes (Signed)
Return Patient Visit  Assessment: [name] is a 76 y.o. female with the following: 1. Other closed fracture of left patella 2. Closed Colles' fracture of right radius  Plan: [name] sustained a couple of injuries in a recent fall.  Repeat radiographs of the left knee demonstrates no interval displacement.  She has minimal tenderness to palpation.  She is able to maintain a straight leg raise, with good strength.  As such, this is most likely a bruise.  At this point, I am not convinced that she had a fracture of the patella.  No need to continue with the knee immobilizer.  Recommend she use a walker to assist with ambulation.  Regarding the right wrist, radiographs remained stable.  We will continue with immobilization.  She was placed in another cast today.  I provided her with an updated prescription of tramadol, and told her she can take 300 mg of gabapentin with each dose.  In addition, we discussed the numbness and tingling she is experiencing in her right hand.  This is been ongoing for several years.  She states that she should have had this taken care of many years ago.  We will plan to treat her for the fracture, and we can reassess her symptoms, which are consistent with carpal tunnel syndrome.  Follow-up in 2 weeks.   Cast application - right short arm cast   Verbal consent was obtained and the correct extremity was identified. A well padded, appropriately molded short arm cast was applied to the right arm Fingers remained warm and well perfused.   There were no sharp edges Patient tolerated the procedure well Cast care instructions were provided    Follow-up: Return in about 2 weeks (around 09/08/2021).  Subjective:  Chief Complaint  Patient presents with   Fracture    DOI 08/06/21    History of Present Illness: [name] is a 76 y.o. female who returns for evaluation of left knee and right wrist pain.  She sustained a fall 2 weeks ago.  She  sustained injuries to her left knee, as well as the right wrist.  She has been using a knee immobilizer on her left leg.  She states this causes more irritation than support.  Regarding the right wrist, she has tolerated the cast.  She continues to have numbness and tingling in her thumb, index and long fingers.  Tramadol is helping with the pain.  Gabapentin has not provided sufficient relief of her burning type sensation.  Review of Systems: No fevers or chills + Numbness and tingling. No chest pain No shortness of breath No bowel or bladder dysfunction No GI distress No headaches  Objective: Ht '5\' 2"'$  (1.575 m)   Wt 249 lb (112.9 kg)   BMI 45.54 kg/m   Physical Exam:  General: Alert and oriented., No acute distress., Seated in a wheelchair., and Obese female. Gait: Unable to ambulate.  Right wrist, after removal of the cast demonstrates no skin breakdown.  Some residual swelling and bruising about the wrist.  Limited range of motion.  Wrist is held in ulnar deviation.  Decreased sensation of the right thumb.  Decreased sensation to the index and long finger.  2+ radial pulse.  Fingers are warm and well-perfused.  Left knee without residual swelling or bruising.  No tenderness to palpation over the patella.  She is able to maintain a straight leg raise, with some strength.    IMAGING: I personally ordered and reviewed the following images  X-rays of the left knee were obtained in clinic today.  No acute injuries are noted.  No further displacement of the partial-thickness fracture identified in the patella on the lateral view.  Minimal swelling in the left knee.  Impression: Stable, minimally displaced fracture of the distal left patella.  X-rays of the right wrist were obtained in clinic today.  These were compared to prior x-rays.  There has been no interval displacement of the distal radius fracture.  Mild dorsal angulation of the joint line.  No intra-articular involvement.   No acute injuries are noted.  Impression: Healing right distal radius fracture, in stable alignment.   New Medications:  Meds ordered this encounter  Medications   traMADol (ULTRAM) 50 MG tablet    Sig: Take 1 tablet (50 mg total) by mouth every 12 (twelve) hours as needed.    Dispense:  10 tablet    Refill:  0      Mordecai Rasmussen, MD  08/25/2021 1:34 PM

## 2021-08-25 NOTE — Patient Instructions (Signed)
General Cast Instructions  1.  You were placed in a cast in clinic today.  Please keep the cast material clean, dry and intact.  Please do not use anything to itch the under the cast.  If it gets itchy, you can consider taking benadryl, or similar medication.  If the cast material gets wet, place it on a towel and use a hair dryer on a low setting. 2.  Tylenol or Ibuprofen/Naproxen as needed.   3.  Recommend elevating your extremity as much as possible to help with swelling. 4.  F/u 2 weeks, cast off and repeat XR     Refill on the tramadol  Can take 300 mg of gabapentin with each dose  Ok to bear weight on your left leg while using a walker.

## 2021-08-29 ENCOUNTER — Encounter: Payer: Self-pay | Admitting: Cardiology

## 2021-08-29 NOTE — Assessment & Plan Note (Signed)
Patient's CHA2DS2-VASc score.  Therefore on DOAC.

## 2021-08-29 NOTE — Assessment & Plan Note (Signed)
Completely asymptomatic.  Remains on carvedilol for rate control. On Eliquis with no bleeding issues.

## 2021-08-29 NOTE — Assessment & Plan Note (Signed)
Blood pressure pretty well controlled on carvedilol 12.5 mg twice daily and lisinopril-HCTZ 20-25 mg daily.  Probably cannot push beta-blocker any further, and not sure spent about a year from increasing the ACE inhibitor dose.  If additional blood pressure required, probably calcium blockers to amiodarone.

## 2021-08-29 NOTE — Assessment & Plan Note (Signed)
Lipid panel looks great as of September.  She is on 40 mg of atorvastatin.  Was started on Mounjaro for diabetes and weight loss.

## 2021-08-29 NOTE — Assessment & Plan Note (Signed)
On DOAC.  Tolerating well.

## 2021-08-29 NOTE — Assessment & Plan Note (Signed)
Unfortunately, she gained almost all the 48 pounds she lost, saw her back in the last couple years.  Just importance of getting back onto her diet and exercise plan

## 2021-08-31 ENCOUNTER — Other Ambulatory Visit: Payer: Self-pay | Admitting: Orthopedic Surgery

## 2021-09-03 NOTE — Progress Notes (Signed)
[facility] Summary Report / Loop Recorder 

## 2021-09-08 ENCOUNTER — Ambulatory Visit (INDEPENDENT_AMBULATORY_CARE_PROVIDER_SITE_OTHER): Payer: HMO | Admitting: Orthopedic Surgery

## 2021-09-08 ENCOUNTER — Encounter: Payer: Self-pay | Admitting: Orthopedic Surgery

## 2021-09-08 ENCOUNTER — Ambulatory Visit (INDEPENDENT_AMBULATORY_CARE_PROVIDER_SITE_OTHER): Payer: HMO

## 2021-09-08 VITALS — Ht 62.0 in | Wt 249.0 lb

## 2021-09-08 DIAGNOSIS — S52531D Colles' fracture of right radius, subsequent encounter for closed fracture with routine healing: Secondary | ICD-10-CM | POA: Diagnosis not present

## 2021-09-08 NOTE — Patient Instructions (Signed)
General Cast Instructions  1.  You were placed in a cast in clinic today.  Please keep the cast material clean, dry and intact.  Please do not use anything to itch the under the cast.  If it gets itchy, you can consider taking benadryl, or similar medication.  If the cast material gets wet, place it on a towel and use a hair dryer on a low setting. 2.  Tylenol or Ibuprofen/Naproxen as needed.   3.  Recommend elevating your extremity as much as possible to help with swelling. 4.  F/u 10 days, cast off and repeat XR

## 2021-09-08 NOTE — Progress Notes (Signed)
Return Patient Visit  Assessment: [name] is a 76 y.o. female with the following: 1. Other closed fracture of left patella 2. Closed Colles' fracture of right radius  Plan: [name] sustained a right distal radius fracture, approximately 1 month ago.  Radiographs remain stable.  She is still having some tenderness about the wrist.  We will plan to proceed with another cast.  Regarding the numbness and tingling, as noted at previous appointment, this is chronic.  We will allow her to fully heal from the right wrist injury, before working this up further.  She has symptoms consistent with chronic carpal tunnel syndrome.  She is ambulating well, with the assistance of a walker.  She states that she is not using a walker at times at home.  She feels as though she has swelling in the left knee, but also has bilateral lower extremity swelling.  I have discussed this with the patient and her son in clinic today.  This could be a product of limited mobility, secondary to her injuries.  However, could be related to other medical issues.  She denies shortness of breath.  Nonetheless, I have urged her to contact her primary care provider to make sure that they are aware.  This may gradually improve, as her mobility gets better.  She should also plan to pump her ankles, to stimulate blood flow, and potentially help with the swelling.  We will continue to monitor her left knee, but no current treatment is needed.  Cast application - right short arm cast   Verbal consent was obtained and the correct extremity was identified. A well padded, appropriately molded short arm cast was applied to the right arm Fingers remained warm and well perfused.   There were no sharp edges Patient tolerated the procedure well Cast care instructions were provided    Follow-up: Return in about 10 days (around 09/18/2021) for early appointment on the 23rd.  Subjective:  Chief Complaint  Patient presents  with   Fracture    Rt wrist, Lt patella    History of Present Illness: [name] is a 76 y.o. female who returns for evaluation of right wrist pain.  She sustained an injury to her right wrist, and left knee during the fall, approximately 1 month ago.  Her left knee has continued to improve.  She is ambulating without a brace.  She is using a walker most of the time.  She continues to remain immobilized due to a right distal radius fracture.  She has tolerated this.  She continues to have numbness and tingling in the median nerve distribution.  As discussed at the last visit, this is chronic.  She is also noticed swelling in bilateral lower extremities.  She feels as though she has a lot of swelling in her left knee.    Review of Systems: No fevers or chills + Numbness and tingling. No chest pain No shortness of breath No bowel or bladder dysfunction No GI distress No headaches  Objective: Ht '5\' 2"'$  (1.575 m)   Wt 249 lb (112.9 kg)   BMI 45.54 kg/m   Physical Exam:  General: Alert and oriented., No acute distress., Seated in a wheelchair., and Obese female. Gait: Unable to ambulate.  Right wrist, after removal of the cast demonstrates no skin breakdown.  Some residual swelling and bruising about the wrist.  Limited range of motion.  Wrist is held in ulnar deviation.  Decreased sensation of the right thumb.  Decreased sensation to  the index and long finger.  2+ radial pulse.  Fingers are warm and well-perfused.  Left knee with healing bruising over the anterior aspect.  Difficult to discern a left knee effusion.  She has diffuse swelling in bilateral lower extremities.  Left is worse than right.    IMAGING: I personally ordered and reviewed the following images  X-rays of the right wrist were obtained in clinic today.  These were compared to prior x-rays.  There has been no interval displacement of the fracture of the right distal radius.  Mild dorsal angulation of the  joint line remains unchanged compared to prior x-rays.  Interval callus formation.  No acute injuries noted.  Impression: Healing right distal radius fracture, in stable alignment.   New Medications:  No orders of the defined types were placed in this encounter.     Mordecai Rasmussen, MD  09/08/2021 10:31 AM

## 2021-09-09 DIAGNOSIS — R600 Localized edema: Secondary | ICD-10-CM | POA: Diagnosis not present

## 2021-09-10 DIAGNOSIS — R600 Localized edema: Secondary | ICD-10-CM | POA: Diagnosis not present

## 2021-09-18 ENCOUNTER — Ambulatory Visit (INDEPENDENT_AMBULATORY_CARE_PROVIDER_SITE_OTHER): Payer: HMO

## 2021-09-18 ENCOUNTER — Ambulatory Visit (INDEPENDENT_AMBULATORY_CARE_PROVIDER_SITE_OTHER): Payer: HMO | Admitting: Orthopedic Surgery

## 2021-09-18 ENCOUNTER — Encounter: Payer: Self-pay | Admitting: Orthopedic Surgery

## 2021-09-18 VITALS — Ht 62.0 in | Wt 249.0 lb

## 2021-09-18 DIAGNOSIS — R200 Anesthesia of skin: Secondary | ICD-10-CM | POA: Diagnosis not present

## 2021-09-18 DIAGNOSIS — M25562 Pain in left knee: Secondary | ICD-10-CM | POA: Diagnosis not present

## 2021-09-18 DIAGNOSIS — G8929 Other chronic pain: Secondary | ICD-10-CM | POA: Diagnosis not present

## 2021-09-18 DIAGNOSIS — S52531D Colles' fracture of right radius, subsequent encounter for closed fracture with routine healing: Secondary | ICD-10-CM | POA: Diagnosis not present

## 2021-09-18 DIAGNOSIS — R202 Paresthesia of skin: Secondary | ICD-10-CM

## 2021-09-21 ENCOUNTER — Ambulatory Visit (INDEPENDENT_AMBULATORY_CARE_PROVIDER_SITE_OTHER): Payer: HMO

## 2021-09-21 DIAGNOSIS — R600 Localized edema: Secondary | ICD-10-CM | POA: Diagnosis not present

## 2021-09-21 DIAGNOSIS — I480 Paroxysmal atrial fibrillation: Secondary | ICD-10-CM

## 2021-09-22 ENCOUNTER — Ambulatory Visit: Payer: HMO | Attending: Orthopedic Surgery

## 2021-09-22 ENCOUNTER — Other Ambulatory Visit: Payer: Self-pay | Admitting: Radiology

## 2021-09-22 DIAGNOSIS — G8929 Other chronic pain: Secondary | ICD-10-CM | POA: Insufficient documentation

## 2021-09-22 DIAGNOSIS — R2681 Unsteadiness on feet: Secondary | ICD-10-CM | POA: Diagnosis not present

## 2021-09-22 DIAGNOSIS — R2689 Other abnormalities of gait and mobility: Secondary | ICD-10-CM | POA: Diagnosis not present

## 2021-09-22 DIAGNOSIS — M25562 Pain in left knee: Secondary | ICD-10-CM | POA: Insufficient documentation

## 2021-09-22 DIAGNOSIS — M6281 Muscle weakness (generalized): Secondary | ICD-10-CM | POA: Diagnosis not present

## 2021-09-22 MED ORDER — TRAMADOL HCL 50 MG PO TABS: 50.0000 mg | ORAL_TABLET | Freq: Two times a day (BID) | ORAL | 0 refills | Status: DC | PRN

## 2021-09-23 LAB — CUP PACEART REMOTE DEVICE CHECK
Date Time Interrogation Session: 20230628065702
Implantable Pulse Generator Implant Date: 20210505

## 2021-10-05 ENCOUNTER — Other Ambulatory Visit: Payer: Self-pay | Admitting: Orthopedic Surgery

## 2021-10-05 ENCOUNTER — Ambulatory Visit: Payer: HMO | Attending: Orthopedic Surgery | Admitting: Physical Therapy

## 2021-10-05 DIAGNOSIS — R2681 Unsteadiness on feet: Secondary | ICD-10-CM | POA: Diagnosis not present

## 2021-10-05 DIAGNOSIS — Z01818 Encounter for other preprocedural examination: Secondary | ICD-10-CM | POA: Diagnosis not present

## 2021-10-05 DIAGNOSIS — R609 Edema, unspecified: Secondary | ICD-10-CM | POA: Diagnosis not present

## 2021-10-05 DIAGNOSIS — G5600 Carpal tunnel syndrome, unspecified upper limb: Secondary | ICD-10-CM | POA: Diagnosis not present

## 2021-10-05 DIAGNOSIS — M6281 Muscle weakness (generalized): Secondary | ICD-10-CM | POA: Insufficient documentation

## 2021-10-05 DIAGNOSIS — R2689 Other abnormalities of gait and mobility: Secondary | ICD-10-CM | POA: Diagnosis not present

## 2021-10-05 DIAGNOSIS — M5430 Sciatica, unspecified side: Secondary | ICD-10-CM | POA: Diagnosis not present

## 2021-10-05 DIAGNOSIS — N1831 Chronic kidney disease, stage 3a: Secondary | ICD-10-CM | POA: Diagnosis not present

## 2021-10-05 NOTE — Therapy (Signed)
OUTPATIENT PHYSICAL THERAPY TREATMENT NOTE   Patient Name: [name] MRN: [id number] DOB:[birth year], 76 y.o., female Today's Date: 10/05/2021  PCP: Orpah Melter, MD  REFERRING PROVIDER: Larena Glassman, MD   END OF SESSION:   PT End of Session - 10/05/21 1536     Visit Number 2    Number of Visits 21    Date for PT Re-Evaluation 12/15/21   2 more weeks for scheduling delay   Authorization Type [employer]    Progress Note Due on Visit 10    PT Start Time 1534    PT Stop Time 1615    PT Time Calculation (min) 41 min    Equipment Utilized During Treatment --    Activity Tolerance Patient tolerated treatment well    Behavior During Therapy WFL for tasks assessed/performed             Past Medical History:  Diagnosis Date   Actinic keratosis    Followed by Dr. Allyson Sabal   Anxiety    Diabetes mellitus, type II, insulin dependent ([place])    Associated with obesity: She takes Lantus, Victoza & Actos   Diplopia    Essential hypertension    GERD (gastroesophageal reflux disease)    Hyperlipidemia    Morbid obesity with BMI of 40.0-44.9, adult [facility])    Past Surgical History:  Procedure Laterality Date   BREAST BIOPSY  1997   LEFT HEART CATH AND CORONARY ANGIOGRAPHY N/A 01/05/2017   Procedure: LEFT HEART CATH AND CORONARY ANGIOGRAPHY;  Surgeon: Leonie Man, MD;  Location: [place] [facility];  Service: Cardiovascular: Angiographically normal coronary arteries with a right dominant system.  Normal EF 55-65%.   LOOP RECORDER INSERTION N/A 08/01/2019   Procedure: LOOP RECORDER INSERTION;  Surgeon: Constance Haw, MD;  Location: [place] [facility];  Service: Cardiovascular;  Laterality: N/A;   Patient Active Problem List   Diagnosis Date Noted   Paroxysmal atrial fibrillation ([place]) 08/08/2019   Secondary hypercoagulable state ([place]) 08/08/2019   Acute cerebrovascular accident of cerebellum ([place]) 07/31/2019   GERD (gastroesophageal reflux disease)     Leukocytosis    Morbid obesity ([place]) 01/25/2018   Chest pain with low risk for cardiac etiology 12/31/2016   Essential hypertension 12/31/2016   Hyperlipidemia 12/31/2016   DM (diabetes mellitus), type 2 with complications ([place]) 46/50/3546    REFERRING DIAG: F68.127,N17.00 (ICD-10-CM) - Chronic pain of left knee    THERAPY DIAG:  Other abnormalities of gait and mobility  Muscle weakness (generalized)  Unsteadiness on feet  Rationale for Evaluation and Treatment Rehabilitation  PERTINENT HISTORY: DM II, GERD, hyperlipidemia, diplopia, obesity, R Colles fx   PRECAUTIONS: Fall, RUE coffee cup weightbearing   SUBJECTIVE: Pt reports she has been trying to walk more in her home, is walking from bedroom to bathroom without AD. Has been cleared by PCP for carpal tunnel surgery of RUE, waiting on cardiologist clearance. Pt has appointment with orthopedics on the 25th. Pt's son, Lanny Cramp, brought in pt's cane w/6-prong tip.   PAIN:  Are you having pain? Yes: NPRS scale: 2/10 Pain location: LLE Pain description: Radiating nerve pain   OBJECTIVE: (objective measures completed at initial evaluation unless otherwise dated)    OBJECTIVE:    DIAGNOSTIC FINDINGS: L displaced patellar fx, R Colles fx   COGNITION: Overall cognitive status: Within functional limits for tasks assessed             SENSATION: Numbness in distal R hand    COORDINATION: Limited AROM  heel-shin due to body habitus    EDEMA:  Circumferential: L LE 3" proximal: 28"; 3" distal: 21"/ R LE 3" proximal: 28.5", 3" distal 21"    POSTURE: rounded shoulders, forward head, and posterior pelvic tilt   GAIT: Gait pattern: step through pattern, decreased stride length, circumduction- Right, circumduction- Left, Right foot flat, Left foot flat, antalgic, trendelenburg, wide BOS, poor foot clearance- Right, and poor foot clearance- Left Distance walked: clinic Assistive device utilized: Walker - 2 wheeled and SPC w/6-prong  tip Level of assistance: CGA Comments: patient using RW despite orders for "coffee cup weight bearing" to R UE, patient unable to grip RW with R UE and rests heel of hand on handle. Pt able to sequence cane well, displays reduced gait speed       TODAY'S TREATMENT:  Ther Ex Established and demonstrated initial HEP for BLE strength and improved transfers:   - Seated knee extension, 10 reps per side w/ 5 second isometric hold - Seated March, 10 reps per side w/2-3 second hold.  - Supine Bridge, 10 reps w/min cues to breathe as pt demonstrating valsalva   Pt demonstrated sit <>supine transfer slowly but independently, repeatedly saying she "can't do it" and provided max encouraging cues for performance. Pt able to bring BLEs onto mat independently, so educated pt and son on using hook method to bring BLEs up together via a swing pivot motion. Pt verbalized that she would try it at home       PATIENT EDUCATION: Education details: Initial HEP, trying hook method for bed mobility  Person educated: Patient and Child(ren) Education method: Explanation Education comprehension: verbalized understanding and needs further education     HOME EXERCISE PROGRAM: Access Code: [email or web address] URL: [email or web address] Date: 10/05/2021 Prepared by: Mickie Bail   Exercises - Seated knee extension   - 1 x daily - 7 x weekly - 3 sets - 10 reps - 5 second hold - Seated March  - 1 x daily - 7 x weekly - 3 sets - 10 reps - Supine Bridge  - 1 x daily - 7 x weekly - 3 sets - 10 reps       GOALS: Goals reviewed with patient? Yes   SHORT TERM GOALS: Target date: 11/03/2021   Pt will be independent with initial HEP for improved strength and balance  Baseline: to be provided Goal status: INITIAL   2.  Pt will improve gait speed to >/= .30ms to demonstrate improved community ambulation   Baseline: .150m Goal status: INITIAL   3.  Pt will improve FGA to >/= 10/30 to demonstrate improved  balance and reduced fall risk   Baseline: 5/30 Goal status: INITIAL   4.  Patient will improve L knee AROM flexion to at least 90* Baseline: 72* Goal status: INITIAL   5.  Patient will improve L knee AROM extension to -15* Baseline: -22* Goal status: INITIAL   LONG TERM GOALS: Target date: 12/15/2021   Pt will be independent with final HEP for improved strength and balance  Baseline: to be provided Goal status: INITIAL   2.  Pt will improve FGA to >/= 20/30 to demonstrate improved balance and reduced fall risk   Baseline: 5/30 Goal status: INITIAL   3.  Pt will improve gait speed to >/= .4589mto demonstrate improved community ambulation   Baseline: .70m62moal status: INITIAL   4.  Patient will improve L knee AROM flexion to >90*  Baseline: 72* Goal status: INITIAL   5.  Patient will improve L knee AROM extension to better than -10* Baseline: -22* Goal status: INITIAL       ASSESSMENT:   CLINICAL IMPRESSION: Emphasis of skilled PT session on establishing initial HEP and practicing bed mobility for improved independence at home. Pt demonstrates fear-avoidance behavior regarding transfers and tends to be self-defeating. Provided max encouraging cues throughout session and pt responded favorably. Pt continues to be limited by muscle weakness and sciatica-like pain in LLE. Continue POC.      OBJECTIVE IMPAIRMENTS Abnormal gait, cardiopulmonary status limiting activity, decreased activity tolerance, decreased balance, decreased coordination, decreased endurance, decreased knowledge of condition, decreased knowledge of use of DME, decreased mobility, difficulty walking, decreased ROM, decreased strength, hypomobility, increased edema, impaired sensation, and obesity.    ACTIVITY LIMITATIONS carrying, lifting, bending, sitting, standing, squatting, stairs, transfers, bed mobility, and locomotion level   PARTICIPATION LIMITATIONS: meal prep, cleaning, laundry, driving,  shopping, community activity, and yard work   PERSONAL FACTORS Age, Behavior pattern, Fitness, Past/current experiences, and 3+ comorbidities: hyperlipidemia, DM II, obesity  are also affecting patient's functional outcome.    REHAB POTENTIAL: Good   CLINICAL DECISION MAKING: Stable/uncomplicated   EVALUATION COMPLEXITY: Low   PLAN: PT FREQUENCY: 2x/week   PT DURATION: 10 weeks   PLANNED INTERVENTIONS: Therapeutic exercises, Therapeutic activity, Neuromuscular re-education, Balance training, Gait training, Patient/Family education, Joint mobilization, Stair training, Vestibular training, Visual/preceptual remediation/compensation, Orthotic/Fit training, DME instructions, Aquatic Therapy, Dry Needling, Electrical stimulation, Cryotherapy, Moist heat, Manual lymph drainage, Compression bandaging, Splintting, Taping, Manual therapy, and Re-evaluation   PLAN FOR NEXT SESSION: How is HEP? scifit for endurance and ROM, OT referral?, weight bearing to R UE/trial cane?    Cruzita Lederer , PT, DPT 10/05/2021, 4:16 PM

## 2021-10-08 ENCOUNTER — Ambulatory Visit: Payer: HMO

## 2021-10-08 DIAGNOSIS — R2689 Other abnormalities of gait and mobility: Secondary | ICD-10-CM | POA: Diagnosis not present

## 2021-10-08 DIAGNOSIS — R2681 Unsteadiness on feet: Secondary | ICD-10-CM

## 2021-10-08 DIAGNOSIS — M6281 Muscle weakness (generalized): Secondary | ICD-10-CM

## 2021-10-08 NOTE — Therapy (Signed)
OUTPATIENT PHYSICAL THERAPY TREATMENT NOTE   Patient Name: Indiana Gamero MRN: [id number] DOB:12/10/1945, 76 y.o., female Today's Date: 10/08/2021  PCP: Orpah Melter, MD  REFERRING PROVIDER: Larena Glassman, MD   END OF SESSION:   PT End of Session - 10/08/21 1532     Visit Number 3    Number of Visits 21    Date for PT Re-Evaluation 12/15/21    Authorization Type [employer]    Progress Note Due on Visit 10    PT Start Time 6301    PT Stop Time 1615    PT Time Calculation (min) 44 min    Equipment Utilized During Treatment Gait belt    Activity Tolerance Patient limited by pain    Behavior During Therapy WFL for tasks assessed/performed             Past Medical History:  Diagnosis Date   Actinic keratosis    Followed by Dr. Allyson Sabal   Anxiety    Diabetes mellitus, type II, insulin dependent ([place])    Associated with obesity: She takes Lantus, Victoza & Actos   Diplopia    Essential hypertension    GERD (gastroesophageal reflux disease)    Hyperlipidemia    Morbid obesity with BMI of 40.0-44.9, adult [facility])    Past Surgical History:  Procedure Laterality Date   BREAST BIOPSY  1997   LEFT HEART CATH AND CORONARY ANGIOGRAPHY N/A 01/05/2017   Procedure: LEFT HEART CATH AND CORONARY ANGIOGRAPHY;  Surgeon: Leonie Man, MD;  Location: [place] [facility];  Service: Cardiovascular: Angiographically normal coronary arteries with a right dominant system.  Normal EF 55-65%.   LOOP RECORDER INSERTION N/A 08/01/2019   Procedure: LOOP RECORDER INSERTION;  Surgeon: Constance Haw, MD;  Location: [place] [facility];  Service: Cardiovascular;  Laterality: N/A;   Patient Active Problem List   Diagnosis Date Noted   Paroxysmal atrial fibrillation ([place]) 08/08/2019   Secondary hypercoagulable state ([place]) 08/08/2019   Acute cerebrovascular accident of cerebellum ([place]) 07/31/2019   GERD (gastroesophageal reflux disease)    Leukocytosis    Morbid obesity ([place])  01/25/2018   Chest pain with low risk for cardiac etiology 12/31/2016   Essential hypertension 12/31/2016   Hyperlipidemia 12/31/2016   DM (diabetes mellitus), type 2 with complications ([place]) 60/12/9321    REFERRING DIAG: F57.322,G25.42 (ICD-10-CM) - Chronic pain of left knee    THERAPY DIAG:  Other abnormalities of gait and mobility  Muscle weakness (generalized)  Unsteadiness on feet  Rationale for Evaluation and Treatment Rehabilitation  PERTINENT HISTORY: DM II, GERD, hyperlipidemia, diplopia, obesity, R Colles fx   PRECAUTIONS: Fall, RUE coffee cup weightbearing   SUBJECTIVE: Patient reports doing okay- "moving slow today." Significant increase in LE pain since last visit- doesn't think it has to do with activities done in therapy.   PAIN:  Are you having pain? Yes: NPRS scale: 8/10 Pain location: LLE Pain description: burning down posterior aspect of leg  *took tylenol, but no relief     OBJECTIVE:    DIAGNOSTIC FINDINGS: L displaced patellar fx, R Colles fx   COGNITION: Overall cognitive status: Within functional limits for tasks assessed             SENSATION: Numbness in distal R hand    COORDINATION: Limited AROM heel-shin due to body habitus    EDEMA:  Circumferential: L LE 3" proximal: 28"; 3" distal: 21"/ R LE 3" proximal: 28.5", 3" distal 21"    POSTURE: rounded shoulders, forward  head, and posterior pelvic tilt   GAIT: Gait pattern: step through pattern, decreased stride length, circumduction- Right, circumduction- Left, Right foot flat, Left foot flat, antalgic, trendelenburg, wide BOS, poor foot clearance- Right, and poor foot clearance- Left Distance walked: clinic Assistive device utilized: Walker - 2 wheeled and SPC w/6-prong tip Level of assistance: CGA Comments: patient using RW despite orders for "coffee cup weight bearing" to R UE, patient unable to grip RW with R UE and rests heel of hand on handle. Pt able to sequence cane well, displays  reduced gait speed       TODAY'S TREATMENT:  Gait: -115' SPC + supervision -lateral stepping at counter x2 laps with B UE support  -fwd/backward walking with U UE support at counter x2 laps  Theract:  -toe tapping to 4" box B LE + SPC + MinA (incomplete weight shift to the L) -education on using belt to assist with LE management for bed mobility   Therex:  -standing HSC L LE 2x10 -standing heel raises at counter 2x10  -scifit level 1 x10 mins     PATIENT EDUCATION: Education details: using belt for LE management with bed mobility Person educated: Patient and Child(ren) Education method: Explanation Education comprehension: verbalized understanding and needs further education     HOME EXERCISE PROGRAM: Access Code: [email or web address] URL: [email or web address] Date: 10/05/2021 Prepared by: Mickie Bail Plaster  Exercises - Seated knee extension   - 1 x daily - 7 x weekly - 3 sets - 10 reps - 5 second hold - Seated March  - 1 x daily - 7 x weekly - 3 sets - 10 reps - Supine Bridge  - 1 x daily - 7 x weekly - 3 sets - 10 reps       GOALS: Goals reviewed with patient? Yes   SHORT TERM GOALS: Target date: 11/03/2021   Pt will be independent with initial HEP for improved strength and balance  Baseline: to be provided Goal status: INITIAL   2.  Pt will improve gait speed to >/= .24ms to demonstrate improved community ambulation   Baseline: .159m Goal status: INITIAL   3.  Pt will improve FGA to >/= 10/30 to demonstrate improved balance and reduced fall risk   Baseline: 5/30 Goal status: INITIAL   4.  Patient will improve L knee AROM flexion to at least 90* Baseline: 72* Goal status: INITIAL   5.  Patient will improve L knee AROM extension to -15* Baseline: -22* Goal status: INITIAL   LONG TERM GOALS: Target date: 12/15/2021   Pt will be independent with final HEP for improved strength and balance  Baseline: to be provided Goal status: INITIAL   2.  Pt  will improve FGA to >/= 20/30 to demonstrate improved balance and reduced fall risk   Baseline: 5/30 Goal status: INITIAL   3.  Pt will improve gait speed to >/= .4544mto demonstrate improved community ambulation   Baseline: .63m52moal status: INITIAL   4.  Patient will improve L knee AROM flexion to >90*  Baseline: 72* Goal status: INITIAL   5.  Patient will improve L knee AROM extension to better than -10* Baseline: -22* Goal status: INITIAL       ASSESSMENT:   CLINICAL IMPRESSION: Patient seen for skilled PT session with emphasis on gait tx with SPC and functional LE strengthening. She reports increased L LE pain today limiting her ability to participate fully in therapy session. Tolerating progression in functional strength exercises and endurance on  scifit well. Persists with trendelenburg with gait and hesitant gait pattern given insecurity with SPC vs RW. Continue POC.      OBJECTIVE IMPAIRMENTS Abnormal gait, cardiopulmonary status limiting activity, decreased activity tolerance, decreased balance, decreased coordination, decreased endurance, decreased knowledge of condition, decreased knowledge of use of DME, decreased mobility, difficulty walking, decreased ROM, decreased strength, hypomobility, increased edema, impaired sensation, and obesity.    ACTIVITY LIMITATIONS carrying, lifting, bending, sitting, standing, squatting, stairs, transfers, bed mobility, and locomotion level   PARTICIPATION LIMITATIONS: meal prep, cleaning, laundry, driving, shopping, community activity, and yard work   PERSONAL FACTORS Age, Behavior pattern, Fitness, Past/current experiences, and 3+ comorbidities: hyperlipidemia, DM II, obesity  are also affecting patient's functional outcome.    REHAB POTENTIAL: Good   CLINICAL DECISION MAKING: Stable/uncomplicated   EVALUATION COMPLEXITY: Low   PLAN: PT FREQUENCY: 2x/week   PT DURATION: 10 weeks   PLANNED INTERVENTIONS: Therapeutic  exercises, Therapeutic activity, Neuromuscular re-education, Balance training, Gait training, Patient/Family education, Joint mobilization, Stair training, Vestibular training, Visual/preceptual remediation/compensation, Orthotic/Fit training, DME instructions, Aquatic Therapy, Dry Needling, Electrical stimulation, Cryotherapy, Moist heat, Manual lymph drainage, Compression bandaging, Splintting, Taping, Manual therapy, and Re-evaluation   PLAN FOR NEXT SESSION: How is HEP? scifit for endurance and ROM, OT referral?, weight bearing to R UE/trial cane?, L weight shifting, ambulatory balance    Debbora Dus, PT, DPT Debbora Dus, PT, DPT, CBIS  10/08/2021, 4:16 PM

## 2021-10-12 ENCOUNTER — Ambulatory Visit: Payer: HMO

## 2021-10-12 DIAGNOSIS — R2681 Unsteadiness on feet: Secondary | ICD-10-CM

## 2021-10-12 DIAGNOSIS — M6281 Muscle weakness (generalized): Secondary | ICD-10-CM

## 2021-10-12 DIAGNOSIS — R2689 Other abnormalities of gait and mobility: Secondary | ICD-10-CM | POA: Diagnosis not present

## 2021-10-12 NOTE — Therapy (Signed)
OUTPATIENT PHYSICAL THERAPY TREATMENT NOTE   Patient Name: [name] MRN: [id number] DOB:March 06, 1946, 76 y.o., female Today's Date: 10/12/2021  PCP: Orpah Melter, MD  REFERRING PROVIDER: Larena Glassman, MD   END OF SESSION:   PT End of Session - 10/12/21 1534     Visit Number 4    Number of Visits 21    Date for PT Re-Evaluation 12/15/21    Authorization Type [employer]    Progress Note Due on Visit 10    PT Start Time 2706    PT Stop Time 1615    PT Time Calculation (min) 44 min    Activity Tolerance Patient tolerated treatment well    Behavior During Therapy WFL for tasks assessed/performed             Past Medical History:  Diagnosis Date   Actinic keratosis    Followed by Dr. Allyson Sabal   Anxiety    Diabetes mellitus, type II, insulin dependent ([place])    Associated with obesity: She takes Lantus, Victoza & Actos   Diplopia    Essential hypertension    GERD (gastroesophageal reflux disease)    Hyperlipidemia    Morbid obesity with BMI of 40.0-44.9, adult [facility])    Past Surgical History:  Procedure Laterality Date   BREAST BIOPSY  1997   LEFT HEART CATH AND CORONARY ANGIOGRAPHY N/A 01/05/2017   Procedure: LEFT HEART CATH AND CORONARY ANGIOGRAPHY;  Surgeon: Leonie Man, MD;  Location: [place] [facility];  Service: Cardiovascular: Angiographically normal coronary arteries with a right dominant system.  Normal EF 55-65%.   LOOP RECORDER INSERTION N/A 08/01/2019   Procedure: LOOP RECORDER INSERTION;  Surgeon: Constance Haw, MD;  Location: [place] [facility];  Service: Cardiovascular;  Laterality: N/A;   Patient Active Problem List   Diagnosis Date Noted   Paroxysmal atrial fibrillation ([place]) 08/08/2019   Secondary hypercoagulable state ([place]) 08/08/2019   Acute cerebrovascular accident of cerebellum ([place]) 07/31/2019   GERD (gastroesophageal reflux disease)    Leukocytosis    Morbid obesity ([place]) 01/25/2018   Chest pain with low risk for  cardiac etiology 12/31/2016   Essential hypertension 12/31/2016   Hyperlipidemia 12/31/2016   DM (diabetes mellitus), type 2 with complications ([place]) 23/76/2831    REFERRING DIAG: D17.616,W73.71 (ICD-10-CM) - Chronic pain of left knee    THERAPY DIAG:  Other abnormalities of gait and mobility  Muscle weakness (generalized)  Unsteadiness on feet  Rationale for Evaluation and Treatment Rehabilitation  PERTINENT HISTORY: DM II, GERD, hyperlipidemia, diplopia, obesity, R Colles fx   PRECAUTIONS: Fall, RUE coffee cup weightbearing   SUBJECTIVE: Patient reports doing well today. No falls, but she did have some near falls reported losing balance to the R. Able to use the wall/furniture to keep her balance.  PAIN:  Are you having pain? Yes: NPRS scale: 8/10 Pain location: LLE Pain description: burning down posterior aspect of leg    OBJECTIVE:    TODAY'S TREATMENT:  Gait: -115' SPC + supervision -115' SPC + supervision with dual cog task (slight increase in gait speed noted)  Therex:  -seated LAQ 3# ankle weights  -R LE proximal edema measurement: 27.5"  -L LE proximal edema measurement: 27"  -seated heel slides with prolonged hold to L knee 2x10  -seated marches 2x20  -standing toe taps to 4" box 3# B ankle weights 2x20  -seated sciatic nerve glide     PATIENT EDUCATION: Education details: progress in therapy thus far, continue HEP  Person  educated: Patient and Child(ren) Education method: Explanation Education comprehension: verbalized understanding and needs further education     HOME EXERCISE PROGRAM: Access Code: [email or web address] URL: [email or web address] Date: 10/05/2021 Prepared by: Mickie Bail Plaster  Exercises - Seated knee extension   - 1 x daily - 7 x weekly - 3 sets - 10 reps - 5 second hold - Seated March  - 1 x daily - 7 x weekly - 3 sets - 10 reps - Supine Bridge  - 1 x daily - 7 x weekly - 3 sets - 10 reps - Seated Knee Flexion Extension AROM    - 1 x daily - 7 x weekly - 3 sets - 10 reps       GOALS: Goals reviewed with patient? Yes   SHORT TERM GOALS: Target date: 11/03/2021   Pt will be independent with initial HEP for improved strength and balance  Baseline: to be provided Goal status: INITIAL   2.  Pt will improve gait speed to >/= .75ms to demonstrate improved community ambulation   Baseline: .121m Goal status: INITIAL   3.  Pt will improve FGA to >/= 10/30 to demonstrate improved balance and reduced fall risk   Baseline: 5/30 Goal status: INITIAL   4.  Patient will improve L knee AROM flexion to at least 90* Baseline: 72* Goal status: INITIAL   5.  Patient will improve L knee AROM extension to -15* Baseline: -22* Goal status: INITIAL   LONG TERM GOALS: Target date: 12/15/2021   Pt will be independent with final HEP for improved strength and balance  Baseline: to be provided Goal status: INITIAL   2.  Pt will improve FGA to >/= 20/30 to demonstrate improved balance and reduced fall risk   Baseline: 5/30 Goal status: INITIAL   3.  Pt will improve gait speed to >/= .4526mto demonstrate improved community ambulation   Baseline: .46m44moal status: INITIAL   4.  Patient will improve L knee AROM flexion to >90*  Baseline: 72* Goal status: INITIAL   5.  Patient will improve L knee AROM extension to better than -10* Baseline: -22* Goal status: INITIAL       ASSESSMENT:   CLINICAL IMPRESSION: Patient seen for skilled PT session with emphasis on gait tx with SPC and functional LE strengthening. Patient reporting less pain today compared to previous session, but still with intermittent flare up of sciatic nerve pain in L LE. PT instructing patient on L sciatic nerve glide. She remains fearful of falling stating that a dr told her she'd have to go to an ALF if she falls again. Patient with improving confidence and gait speed with SPC, but would benefit from further practice. Improving edema and L knee  AROM noted as well. Continue POC.      OBJECTIVE IMPAIRMENTS Abnormal gait, cardiopulmonary status limiting activity, decreased activity tolerance, decreased balance, decreased coordination, decreased endurance, decreased knowledge of condition, decreased knowledge of use of DME, decreased mobility, difficulty walking, decreased ROM, decreased strength, hypomobility, increased edema, impaired sensation, and obesity.    ACTIVITY LIMITATIONS carrying, lifting, bending, sitting, standing, squatting, stairs, transfers, bed mobility, and locomotion level   PARTICIPATION LIMITATIONS: meal prep, cleaning, laundry, driving, shopping, community activity, and yard work   PERSONAL FACTORS Age, Behavior pattern, Fitness, Past/current experiences, and 3+ comorbidities: hyperlipidemia, DM II, obesity  are also affecting patient's functional outcome.    REHAB POTENTIAL: Good   CLINICAL DECISION MAKING: Stable/uncomplicated   EVALUATION COMPLEXITY: Low   PLAN: PT FREQUENCY:  2x/week   PT DURATION: 10 weeks   PLANNED INTERVENTIONS: Therapeutic exercises, Therapeutic activity, Neuromuscular re-education, Balance training, Gait training, Patient/Family education, Joint mobilization, Stair training, Vestibular training, Visual/preceptual remediation/compensation, Orthotic/Fit training, DME instructions, Aquatic Therapy, Dry Needling, Electrical stimulation, Cryotherapy, Moist heat, Manual lymph drainage, Compression bandaging, Splintting, Taping, Manual therapy, and Re-evaluation   PLAN FOR NEXT SESSION: How is HEP? scifit for endurance and ROM, OT referral?, L weight shifting, ambulatory balance, SPC gait tx    Debbora Dus, PT, DPT Debbora Dus, PT, DPT, CBIS  10/12/2021, 4:17 PM

## 2021-10-13 ENCOUNTER — Encounter: Payer: Self-pay | Admitting: Cardiology

## 2021-10-13 NOTE — Telephone Encounter (Signed)
Error

## 2021-10-14 ENCOUNTER — Encounter: Payer: Self-pay | Admitting: Physical Medicine and Rehabilitation

## 2021-10-14 ENCOUNTER — Ambulatory Visit: Payer: HMO | Admitting: Physical Medicine and Rehabilitation

## 2021-10-14 DIAGNOSIS — R202 Paresthesia of skin: Secondary | ICD-10-CM

## 2021-10-14 NOTE — Progress Notes (Signed)
Pt state right wrist burning, numbness and tingling. Pt state she had a lot of this numbness before to her fracture to her wrist and hand. Pt state it hurt for her to grip and hold items. Pt state she takes over the counter pain meds to help ease his pain. Pt state she right handed.  Numeric Pain Rating Scale and Functional Assessment Average Pain 8   In the last MONTH (on 0-10 scale) has pain interfered with the following?  1. General activity like being  able to carry out your everyday physical activities such as walking, climbing stairs, carrying groceries, or moving a chair?  Rating(10)   +BT,

## 2021-10-15 ENCOUNTER — Ambulatory Visit: Payer: HMO

## 2021-10-15 DIAGNOSIS — R2681 Unsteadiness on feet: Secondary | ICD-10-CM

## 2021-10-15 DIAGNOSIS — M6281 Muscle weakness (generalized): Secondary | ICD-10-CM

## 2021-10-15 DIAGNOSIS — R2689 Other abnormalities of gait and mobility: Secondary | ICD-10-CM

## 2021-10-15 NOTE — Progress Notes (Signed)
[facility] Summary Report / Loop Recorder 

## 2021-10-15 NOTE — Therapy (Signed)
OUTPATIENT PHYSICAL THERAPY TREATMENT NOTE   Patient Name: [name] MRN: [id number] DOB:[birth year], 76 y.o., female Today's Date: 10/15/2021  PCP: Orpah Melter, MD  REFERRING PROVIDER: Larena Glassman, MD   END OF SESSION:   PT End of Session - 10/15/21 1537     Visit Number 5    Number of Visits 21    Date for PT Re-Evaluation 12/15/21    Authorization Type [employer]    Progress Note Due on Visit 10    PT Start Time 1532    PT Stop Time 1614    PT Time Calculation (min) 42 min    Activity Tolerance Patient tolerated treatment well    Behavior During Therapy WFL for tasks assessed/performed             Past Medical History:  Diagnosis Date   Actinic keratosis    Followed by Dr. Allyson Sabal   Anxiety    Diabetes mellitus, type II, insulin dependent ([place])    Associated with obesity: She takes Lantus, Victoza & Actos   Diplopia    Essential hypertension    GERD (gastroesophageal reflux disease)    Hyperlipidemia    Morbid obesity with BMI of 40.0-44.9, adult [facility])    Past Surgical History:  Procedure Laterality Date   BREAST BIOPSY  1997   LEFT HEART CATH AND CORONARY ANGIOGRAPHY N/A 01/05/2017   Procedure: LEFT HEART CATH AND CORONARY ANGIOGRAPHY;  Surgeon: Leonie Man, MD;  Location: [place] [facility];  Service: Cardiovascular: Angiographically normal coronary arteries with a right dominant system.  Normal EF 55-65%.   LOOP RECORDER INSERTION N/A 08/01/2019   Procedure: LOOP RECORDER INSERTION;  Surgeon: Constance Haw, MD;  Location: [place] [facility];  Service: Cardiovascular;  Laterality: N/A;   Patient Active Problem List   Diagnosis Date Noted   Paroxysmal atrial fibrillation ([place]) 08/08/2019   Secondary hypercoagulable state ([place]) 08/08/2019   Acute cerebrovascular accident of cerebellum ([place]) 07/31/2019   GERD (gastroesophageal reflux disease)    Leukocytosis    Morbid obesity ([place]) 01/25/2018   Chest pain with low risk for  cardiac etiology 12/31/2016   Essential hypertension 12/31/2016   Hyperlipidemia 12/31/2016   DM (diabetes mellitus), type 2 with complications ([place]) 70/17/7939    REFERRING DIAG: Q30.092,Z30.07 (ICD-10-CM) - Chronic pain of left knee    THERAPY DIAG:  Other abnormalities of gait and mobility  Muscle weakness (generalized)  Unsteadiness on feet  Rationale for Evaluation and Treatment Rehabilitation  PERTINENT HISTORY: DM II, GERD, hyperlipidemia, diplopia, obesity, R Colles fx   PRECAUTIONS: Fall, RUE coffee cup weightbearing   SUBJECTIVE: Patient reports doing well today. No falls or near falls. Did have EMG done yesterday and per son, is a candidate for carpal tunnel release. Still waiting for ortho follow up re: OT eval.   PAIN:  Are you having pain? Yes: NPRS scale: 7/10 Pain location: LLE Pain description: burning down posterior aspect of leg; nerve pain keeping her up at night     OBJECTIVE:    TODAY'S TREATMENT:  Therex:  - supine heel slides 2x10  -supine L hip abduction 2x10  - L SAQ 2x10  -stagger stance sit <> stand 2x5 biasing L LE  -sit <> stand R LE on 2" box -> L LE on 2" box 2x5  -scifit level 1 x8 mins     PATIENT EDUCATION: Education details: continue HEP  Person educated: Patient and Child(ren) Education method: Explanation Education comprehension: verbalized understanding and needs further  education     HOME EXERCISE PROGRAM: Access Code: [email or web address] URL: [email or web address] Date: 10/05/2021 Prepared by: Mickie Bail Plaster  Exercises - Seated knee extension   - 1 x daily - 7 x weekly - 3 sets - 10 reps - 5 second hold - Seated [place]  - 1 x daily - 7 x weekly - 3 sets - 10 reps - Supine Bridge  - 1 x daily - 7 x weekly - 3 sets - 10 reps - Seated Knee Flexion Extension AROM   - 1 x daily - 7 x weekly - 3 sets - 10 reps       GOALS: Goals reviewed with patient? Yes   SHORT TERM GOALS: Target date: 11/03/2021   Pt will be  independent with initial HEP for improved strength and balance  Baseline: provided  Goal status: MET   2.  Pt will improve gait speed to >/= .81ms to demonstrate improved community ambulation   Baseline: .145m Goal status: INITIAL   3.  Pt will improve FGA to >/= 10/30 to demonstrate improved balance and reduced fall risk   Baseline: 5/30 Goal status: INITIAL   4.  Patient will improve L knee AROM flexion to at least 90* Baseline: 72* Goal status: INITIAL   5.  Patient will improve L knee AROM extension to -15* Baseline: -22* Goal status: INITIAL   LONG TERM GOALS: Target date: 12/15/2021   Pt will be independent with final HEP for improved strength and balance  Baseline: to be provided Goal status: INITIAL   2.  Pt will improve FGA to >/= 20/30 to demonstrate improved balance and reduced fall risk   Baseline: 5/30 Goal status: INITIAL   3.  Pt will improve gait speed to >/= .4542mto demonstrate improved community ambulation   Baseline: .18m74moal status: INITIAL   4.  Patient will improve L knee AROM flexion to >90*  Baseline: 72* Goal status: INITIAL   5.  Patient will improve L knee AROM extension to better than -10* Baseline: -22* Goal status: INITIAL       ASSESSMENT:   CLINICAL IMPRESSION: Patient seen for skilled PT session with emphasis on functional strength training. She has improved her AROM of L LE, but continue to demonstrate gait instability and fearfulness surrounding mobility. Discussed possible need for RW vs SPC, but given current weight bearing restrictions, patient should continue to use SPC. She would benefit from further strengthening and AROM to L LE, especially knee flexion throughout swing phase of gait. Continue POC.      OBJECTIVE IMPAIRMENTS Abnormal gait, cardiopulmonary status limiting activity, decreased activity tolerance, decreased balance, decreased coordination, decreased endurance, decreased knowledge of condition, decreased  knowledge of use of DME, decreased mobility, difficulty walking, decreased ROM, decreased strength, hypomobility, increased edema, impaired sensation, and obesity.    ACTIVITY LIMITATIONS carrying, lifting, bending, sitting, standing, squatting, stairs, transfers, bed mobility, and locomotion level   PARTICIPATION LIMITATIONS: meal prep, cleaning, laundry, driving, shopping, community activity, and yard work   PERSONAL FACTORS Age, Behavior pattern, Fitness, Past/current experiences, and 3+ comorbidities: hyperlipidemia, DM II, obesity  are also affecting patient's functional outcome.    REHAB POTENTIAL: Good   CLINICAL DECISION MAKING: Stable/uncomplicated   EVALUATION COMPLEXITY: Low   PLAN: PT FREQUENCY: 2x/week   PT DURATION: 10 weeks   PLANNED INTERVENTIONS: Therapeutic exercises, Therapeutic activity, Neuromuscular re-education, Balance training, Gait training, Patient/Family education, Joint mobilization, Stair training, Vestibular training, Visual/preceptual remediation/compensation, Orthotic/Fit training, DME instructions, Aquatic Therapy, Dry Needling, Electrical  stimulation, Cryotherapy, Moist heat, Manual lymph drainage, Compression bandaging, Splintting, Taping, Manual therapy, and Re-evaluation   PLAN FOR NEXT SESSION: scifit for endurance and ROM, OT referral?, L weight shifting, ambulatory balance, SPC gait tx    Debbora Dus, PT, DPT Debbora Dus, PT, DPT, CBIS  10/15/2021, 4:16 PM

## 2021-10-19 ENCOUNTER — Other Ambulatory Visit: Payer: Self-pay | Admitting: Physician Assistant

## 2021-10-19 ENCOUNTER — Ambulatory Visit: Payer: HMO | Admitting: Physical Therapy

## 2021-10-19 NOTE — Progress Notes (Signed)
[name] - 76 y.o. female MRN [id number]  Date of birth: Feb 05, 1946  Office Visit Note: Visit Date: 10/14/2021 PCP: Orpah Melter, MD Referred by: Mordecai Rasmussen, MD  Subjective: Chief Complaint  Patient presents with   Right Hand - Numbness, Tingling, Burn   Right Wrist - Numbness, Burn, Tingling   HPI:  [name] is a 76 y.o. female who comes in today at the request of Dr. Larena Glassman for electrodiagnostic study of the Right upper extremities.  Patient is Right hand dominant.  She is status posta right distal radius fracture approximately 2 months ago.  Since that time she has had worsening pain numbness and tingling particularly in the wrist and radial digits.  She reports numbness and tingling prior to the fracture.  Denies any left-sided complaints.  No frank radicular symptoms.  No prior electrodiagnostic studies.  She does have a history of diabetes.   ROS Otherwise per HPI.  Assessment & Plan: Visit Diagnoses:    ICD-10-CM   1. 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 The lesion is characterized by sensory and motor demyelination with evidence of significant axonal injury.  Despite decompressive treatment there is likely going to be residual symptoms.   There is no significant electrodiagnostic evidence of any other focal nerve entrapment, brachial plexopathy or cervical radiculopathy.   Recommendations: 1.  Follow-up with referring physician. 2.  Continue current management of symptoms. 3.  Suggest surgical evaluation.  Meds & Orders: No orders of the defined types were placed in this encounter.   Orders Placed This Encounter  Procedures   NCV with EMG (electromyography)    Follow-up: Return in about 2 weeks (around  10/28/2021) for Larena Glassman, MD.   Procedures: No procedures performed  EMG & NCV Findings: Evaluation of the right median motor nerve showed prolonged distal onset latency (13.8 ms) and reduced amplitude (0.0 mV).  The right median (across palm) sensory nerve showed no response (Wrist) and prolonged distal peak latency (Palm, 4.6 ms).  All remaining nerves (as indicated in the following tables) were within normal limits.    Needle evaluation of the right abductor pollicis brevis muscle showed increased insertional activity, widespread spontaneous activity, decreased motor unit amplitude, and diminished recruitment.  All remaining muscles (as indicated in the following table) showed no evidence of electrical instability.    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 The lesion is characterized by sensory and motor demyelination with evidence of significant axonal injury.  Despite decompressive treatment there is likely going to be residual symptoms.   There is no significant electrodiagnostic evidence of any other focal nerve entrapment, brachial plexopathy or cervical radiculopathy.   Recommendations: 1.  Follow-up with referring physician. 2.  Continue current management of symptoms. 3.  Suggest surgical evaluation.  ___________________________ Laurence Spates FAAPMR Board Certified, American Board of Physical Medicine and Rehabilitation    Nerve Conduction Studies Anti Sensory Summary Table   Stim Site NR Peak (ms) Norm Peak (ms) P-T Amp (V) Norm P-T Amp Site1 Site2 Delta-P (ms) Dist (cm) Vel (m/s) Norm Vel (m/s)  Right Median Acr Palm Anti Sensory (2nd Digit)  32.4C  Wrist *  NR  <3.6  >10 Wrist Palm  0.0    Palm    *4.6 <2.0 9.3         Right Radial Anti Sensory (Base 1st Digit)  32.2C  Wrist    2.4 <3.1 6.4  Wrist Base 1st  Digit 2.4 0.0    Right Ulnar Anti Sensory (5th Digit)  32.4C  Wrist    3.3 <3.7 26.9 >15.0 Wrist 5th Digit 3.3 14.0 42 >38   Motor Summary Table   Stim Site NR Onset (ms) Norm Onset (ms) O-P Amp (mV) Norm O-P Amp Site1 Site2 Delta-0 (ms) Dist (cm) Vel (m/s) Norm Vel (m/s)  Right Median Motor (Abd Poll Brev)  32.2C  Wrist    *13.8 <4.2 *0.0 >5 Elbow Wrist 0.0 18.0  >50  Elbow    13.8  0.0         Right Ulnar Motor (Abd Dig Min)  32.1C  Wrist    3.5 <4.2 4.2 >3 B Elbow Wrist 3.0 17.0 57 >53  B Elbow    6.5  4.0  A Elbow B Elbow 1.4 10.0 71 >53  A Elbow    7.9  3.7          EMG   Side Muscle Nerve Root Ins Act Fibs Psw Amp Dur Poly Recrt Int Fraser Din Comment  Right Abd Poll Brev Median C8-T1 *Incr *4+ *4+ *Decr Nml 0 *Reduced Nml   Right 1stDorInt Ulnar C8-T1 Nml Nml Nml Nml Nml 0 Nml Nml   Right PronatorTeres Median C6-7 Nml Nml Nml Nml Nml 0 Nml Nml   Right Biceps Musculocut C5-6 Nml Nml Nml Nml Nml 0 Nml Nml   Right Deltoid Axillary C5-6 Nml Nml Nml Nml Nml 0 Nml Nml     Nerve Conduction Studies Anti Sensory Left/Right Comparison   Stim Site L Lat (ms) R Lat (ms) L-R Lat (ms) L Amp (V) R Amp (V) L-R Amp (%) Site1 Site2 L Vel (m/s) R Vel (m/s) L-R Vel (m/s)  Median Acr Palm Anti Sensory (2nd Digit)  32.4C  Wrist       Wrist Palm     Palm  *4.6   9.3        Radial Anti Sensory (Base 1st Digit)  32.2C  Wrist  2.4   6.4  Wrist Base 1st Digit     Ulnar Anti Sensory (5th Digit)  32.4C  Wrist  3.3   26.9  Wrist 5th Digit  42    Motor Left/Right Comparison   Stim Site L Lat (ms) R Lat (ms) L-R Lat (ms) L Amp (mV) R Amp (mV) L-R Amp (%) Site1 Site2 L Vel (m/s) R Vel (m/s) L-R Vel (m/s)  Median Motor (Abd Poll Brev)  32.2C  Wrist  *13.8   *0.0  Elbow Wrist     Elbow  13.8   0.0        Ulnar Motor (Abd Dig Min)  32.1C  Wrist  3.5   4.2  B Elbow Wrist  57   B Elbow  6.5   4.0  A Elbow B Elbow  71   A Elbow  7.9   3.7           Waveforms:             Clinical  History: No specialty comments available.     Objective:  VS:  HT:    WT:   BMI:     BP:   HR: bpm  TEMP: ( )  RESP:  Physical Exam Musculoskeletal:        General: No swelling, tenderness or deformity.     Comments: Inspection reveals flattening of the right APB no atrophy of the bilateral FDI or hand intrinsics. There is no swelling, color changes, allodynia or dystrophic changes. There is 5 out of 5 strength in the bilateral wrist extension, finger abduction and long finger flexion. There is intact sensation to light touch in all dermatomal and peripheral nerve distributions.   Skin:    General: Skin is warm and dry.     Findings: No erythema or rash.  Neurological:     General: No focal deficit present.     Mental Status: She is alert and oriented to person, place, and time.     Motor: No weakness or abnormal muscle tone.     Coordination: Coordination normal.  Psychiatric:        Mood and Affect: Mood normal.        Behavior: Behavior normal.      Imaging: No results found.

## 2021-10-19 NOTE — Procedures (Signed)
EMG & NCV Findings: Evaluation of the right median motor nerve showed prolonged distal onset latency (13.8 ms) and reduced amplitude (0.0 mV).  The right median (across palm) sensory nerve showed no response (Wrist) and prolonged distal peak latency (Palm, 4.6 ms).  All remaining nerves (as indicated in the following tables) were within normal limits.    Needle evaluation of the right abductor pollicis brevis muscle showed increased insertional activity, widespread spontaneous activity, decreased motor unit amplitude, and diminished recruitment.  All remaining muscles (as indicated in the following table) showed no evidence of electrical instability.    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 The lesion is characterized by sensory and motor demyelination with evidence of significant axonal injury.  Despite decompressive treatment there is likely going to be residual symptoms.   There is no significant electrodiagnostic evidence of any other focal nerve entrapment, brachial plexopathy or cervical radiculopathy.   Recommendations: 1.  Follow-up with referring physician. 2.  Continue current management of symptoms. 3.  Suggest surgical evaluation.  ___________________________ Laurence Spates FAAPMR Board Certified, American Board of Physical Medicine and Rehabilitation    Nerve Conduction Studies Anti Sensory Summary Table   Stim Site NR Peak (ms) Norm Peak (ms) P-T Amp (V) Norm P-T Amp Site1 Site2 Delta-P (ms) Dist (cm) Vel (m/s) Norm Vel (m/s)  Right Median Acr Palm Anti Sensory (2nd Digit)  32.4C  Wrist *NR  <3.6  >10 Wrist Palm  0.0    Palm    *4.6 <2.0 9.3         Right Radial Anti Sensory (Base 1st Digit)  32.2C  Wrist    2.4 <3.1 6.4  Wrist Base 1st Digit 2.4 0.0    Right Ulnar Anti Sensory (5th Digit)  32.4C  Wrist     3.3 <3.7 26.9 >15.0 Wrist 5th Digit 3.3 14.0 42 >38   Motor Summary Table   Stim Site NR Onset (ms) Norm Onset (ms) O-P Amp (mV) Norm O-P Amp Site1 Site2 Delta-0 (ms) Dist (cm) Vel (m/s) Norm Vel (m/s)  Right Median Motor (Abd Poll Brev)  32.2C  Wrist    *13.8 <4.2 *0.0 >5 Elbow Wrist 0.0 18.0  >50  Elbow    13.8  0.0         Right Ulnar Motor (Abd Dig Min)  32.1C  Wrist    3.5 <4.2 4.2 >3 B Elbow Wrist 3.0 17.0 57 >53  B Elbow    6.5  4.0  A Elbow B Elbow 1.4 10.0 71 >53  A Elbow    7.9  3.7          EMG   Side Muscle Nerve Root Ins Act Fibs Psw Amp Dur Poly Recrt Int Fraser Din Comment  Right Abd Poll Brev Median C8-T1 *Incr *4+ *4+ *Decr Nml 0 *Reduced Nml   Right 1stDorInt Ulnar C8-T1 Nml Nml Nml Nml Nml 0 Nml Nml   Right PronatorTeres Median C6-7 Nml Nml Nml Nml Nml 0 Nml Nml   Right Biceps Musculocut C5-6 Nml Nml Nml Nml Nml 0 Nml Nml   Right Deltoid Axillary C5-6 Nml Nml Nml Nml Nml 0 Nml Nml     Nerve Conduction Studies Anti Sensory Left/Right Comparison   Stim Site L Lat (ms) R Lat (ms) L-R Lat (ms) L Amp (V) R Amp (V) L-R Amp (%) Site1 Site2 L Vel (m/s)  R Vel (m/s) L-R Vel (m/s)  Median Acr Palm Anti Sensory (2nd Digit)  32.4C  Wrist       Wrist Palm     Palm  *4.6   9.3        Radial Anti Sensory (Base 1st Digit)  32.2C  Wrist  2.4   6.4  Wrist Base 1st Digit     Ulnar Anti Sensory (5th Digit)  32.4C  Wrist  3.3   26.9  Wrist 5th Digit  42    Motor Left/Right Comparison   Stim Site L Lat (ms) R Lat (ms) L-R Lat (ms) L Amp (mV) R Amp (mV) L-R Amp (%) Site1 Site2 L Vel (m/s) R Vel (m/s) L-R Vel (m/s)  Median Motor (Abd Poll Brev)  32.2C  Wrist  *13.8   *0.0  Elbow Wrist     Elbow  13.8   0.0        Ulnar Motor (Abd Dig Min)  32.1C  Wrist  3.5   4.2  B Elbow Wrist  57   B Elbow  6.5   4.0  A Elbow B Elbow  71   A Elbow  7.9   3.7           Waveforms:

## 2021-10-20 ENCOUNTER — Ambulatory Visit: Payer: PPO | Admitting: Orthopedic Surgery

## 2021-10-20 ENCOUNTER — Encounter: Payer: Self-pay | Admitting: Orthopedic Surgery

## 2021-10-20 DIAGNOSIS — G5601 Carpal tunnel syndrome, right upper limb: Secondary | ICD-10-CM

## 2021-10-20 DIAGNOSIS — S52531D Colles' fracture of right radius, subsequent encounter for closed fracture with routine healing: Secondary | ICD-10-CM

## 2021-10-20 MED ORDER — TRAMADOL HCL 50 MG PO TABS: 50.0000 mg | ORAL_TABLET | Freq: Two times a day (BID) | ORAL | 0 refills | Status: DC | PRN

## 2021-10-20 NOTE — Patient Instructions (Signed)
OT referral for right hand and wrist  Will confirm clearance for surgery; once you have been cleared by cardiologist, we can schedule right carpal tunnel release

## 2021-10-21 NOTE — H&P (View-Only) (Signed)
Return Patient Visit  Assessment: [name] is a 76 y.o. female with the following: Closed Colles' fracture of right radius Severe right carpal tunnel syndrome.  Plan: [name] has recovered following a distal radius fracture.  She does have stiffness in the wrist, as well as her fingers.  I stressed the importance of working on this.  I demonstrated activities for her to complete in clinic.  I have also placed a referral for occupational therapy, to work on range of motion of her right hand.  No restrictions at this time.  She also has severe right carpal tunnel syndrome, with advanced findings on EMG.  She is interested in surgical release.  This was discussed in great detail.  We will have to secure medical clearance prior to scheduling surgery.  We will send letters to her primary care doctor, as well as her cardiologist.  Once we have clearance, we can schedule a surgical date.  Follow-up: Return for After medical clearance for OR.  Subjective:  Chief Complaint  Patient presents with   Hand Pain    Review the nerve study right hand    Medication Refill    Would like Tramadol / wants 90 day supply.     History of Present Illness: [name] is a 76 y.o. female who returns for evaluation of right wrist pain.  She sustained an injury to her right wrist 2 months ago.  She has chronic right hand numbness and tingling that has been this way for a couple of years.  She states it has gotten worse since the injury.  She has continued to use the brace.  She has not been using her right hand and wrist.  She continues to have pain.  She has obtained EMGs, and and is here to discuss the results.  Review of Systems: No fevers or chills + Numbness and tingling. No chest pain No shortness of breath No bowel or bladder dysfunction No GI distress No headaches  Objective: There were no vitals taken for this visit.  Physical Exam:  General: Alert and oriented., No  acute distress., Seated in a wheelchair., and Obese female. Gait: Ambulating with minimal assistance.  Right wrist without deformity.  No swelling is appreciated.  She does have some atrophy within the thenar musculature.  She has restricted range of motion of her fingers, as well as the wrist.  Mild tenderness to palpation over the distal radius.  Positive Tinel's at the carpal tunnel.  Decreased sensation in the median nerve distribution.  Evaluation of left hand demonstrates no deformity.  Mildly positive Phalen's.  Negative Tinel's.  Negative carpal tunnel compression.    IMAGING: I personally ordered and reviewed the following images  No new imaging obtained today.  EMG right upper extremity  Plan: Impression: The above electrodiagnostic study is ABNORMAL and reveals evidence of a very severe right median nerve entrapment at the wrist (carpal tunnel syndrome) affecting sensory and motor components. The lesion is characterized by sensory and motor demyelination with evidence of significant axonal injury.  Despite decompressive treatment there is likely going to be residual symptoms.  New Medications:  Meds ordered this encounter  Medications   traMADol (ULTRAM) 50 MG tablet    Sig: Take 1 tablet (50 mg total) by mouth every 12 (twelve) hours as needed.    Dispense:  20 tablet    Refill:  0      Mordecai Rasmussen, MD  10/21/2021 1:30 PM

## 2021-10-21 NOTE — Progress Notes (Signed)
Return Patient Visit  Assessment: [name] is a 76 y.o. female with the following: Closed Colles' fracture of right radius Severe right carpal tunnel syndrome.  Plan: [name] has recovered following a distal radius fracture.  She does have stiffness in the wrist, as well as her fingers.  I stressed the importance of working on this.  I demonstrated activities for her to complete in clinic.  I have also placed a referral for occupational therapy, to work on range of motion of her right hand.  No restrictions at this time.  She also has severe right carpal tunnel syndrome, with advanced findings on EMG.  She is interested in surgical release.  This was discussed in great detail.  We will have to secure medical clearance prior to scheduling surgery.  We will send letters to her primary care doctor, as well as her cardiologist.  Once we have clearance, we can schedule a surgical date.  Follow-up: Return for After medical clearance for OR.  Subjective:  Chief Complaint  Patient presents with   Hand Pain    Review the nerve study right hand    Medication Refill    Would like Tramadol / wants 90 day supply.     History of Present Illness: [name] is a 76 y.o. female who returns for evaluation of right wrist pain.  She sustained an injury to her right wrist 2 months ago.  She has chronic right hand numbness and tingling that has been this way for a couple of years.  She states it has gotten worse since the injury.  She has continued to use the brace.  She has not been using her right hand and wrist.  She continues to have pain.  She has obtained EMGs, and and is here to discuss the results.  Review of Systems: No fevers or chills + Numbness and tingling. No chest pain No shortness of breath No bowel or bladder dysfunction No GI distress No headaches  Objective: There were no vitals taken for this visit.  Physical Exam:  General: Alert and oriented., No  acute distress., Seated in a wheelchair., and Obese female. Gait: Ambulating with minimal assistance.  Right wrist without deformity.  No swelling is appreciated.  She does have some atrophy within the thenar musculature.  She has restricted range of motion of her fingers, as well as the wrist.  Mild tenderness to palpation over the distal radius.  Positive Tinel's at the carpal tunnel.  Decreased sensation in the median nerve distribution.  Evaluation of left hand demonstrates no deformity.  Mildly positive Phalen's.  Negative Tinel's.  Negative carpal tunnel compression.    IMAGING: I personally ordered and reviewed the following images  No new imaging obtained today.  EMG right upper extremity  Plan: Impression: The above electrodiagnostic study is ABNORMAL and reveals evidence of a very severe right median nerve entrapment at the wrist (carpal tunnel syndrome) affecting sensory and motor components. The lesion is characterized by sensory and motor demyelination with evidence of significant axonal injury.  Despite decompressive treatment there is likely going to be residual symptoms.  New Medications:  Meds ordered this encounter  Medications   traMADol (ULTRAM) 50 MG tablet    Sig: Take 1 tablet (50 mg total) by mouth every 12 (twelve) hours as needed.    Dispense:  20 tablet    Refill:  0      Mordecai Rasmussen, MD  10/21/2021 1:30 PM

## 2021-10-22 ENCOUNTER — Ambulatory Visit: Payer: HMO

## 2021-10-22 DIAGNOSIS — R2689 Other abnormalities of gait and mobility: Secondary | ICD-10-CM

## 2021-10-22 DIAGNOSIS — M6281 Muscle weakness (generalized): Secondary | ICD-10-CM

## 2021-10-22 DIAGNOSIS — R2681 Unsteadiness on feet: Secondary | ICD-10-CM

## 2021-10-22 LAB — CUP PACEART REMOTE DEVICE CHECK
Date Time Interrogation Session: 20230722230445
Implantable Pulse Generator Implant Date: 20210505

## 2021-10-22 NOTE — Therapy (Signed)
OUTPATIENT PHYSICAL THERAPY TREATMENT NOTE   Patient Name: [name] MRN: [id number] DOB:02-19-1946, 76 y.o., female Today's Date: 10/22/2021  PCP: Orpah Melter, MD  REFERRING PROVIDER: Larena Glassman, MD   END OF SESSION:   PT End of Session - 10/22/21 1532     Visit Number 6    Number of Visits 21    Date for PT Re-Evaluation 12/15/21    Authorization Type [employer]    Progress Note Due on Visit 10    PT Start Time 2202    PT Stop Time 1615    PT Time Calculation (min) 44 min    Activity Tolerance Patient tolerated treatment well    Behavior During Therapy WFL for tasks assessed/performed             Past Medical History:  Diagnosis Date   Actinic keratosis    Followed by Dr. Allyson Sabal   Anxiety    Diabetes mellitus, type II, insulin dependent ([place])    Associated with obesity: She takes Lantus, Victoza & Actos   Diplopia    Essential hypertension    GERD (gastroesophageal reflux disease)    Hyperlipidemia    Morbid obesity with BMI of 40.0-44.9, adult [facility])    Past Surgical History:  Procedure Laterality Date   BREAST BIOPSY  1997   LEFT HEART CATH AND CORONARY ANGIOGRAPHY N/A 01/05/2017   Procedure: LEFT HEART CATH AND CORONARY ANGIOGRAPHY;  Surgeon: Leonie Man, MD;  Location: [place] [facility];  Service: Cardiovascular: Angiographically normal coronary arteries with a right dominant system.  Normal EF 55-65%.   LOOP RECORDER INSERTION N/A 08/01/2019   Procedure: LOOP RECORDER INSERTION;  Surgeon: Constance Haw, MD;  Location: [place] [facility];  Service: Cardiovascular;  Laterality: N/A;   Patient Active Problem List   Diagnosis Date Noted   Paroxysmal atrial fibrillation ([place]) 08/08/2019   Secondary hypercoagulable state ([place]) 08/08/2019   Acute cerebrovascular accident of cerebellum ([place]) 07/31/2019   GERD (gastroesophageal reflux disease)    Leukocytosis    Morbid obesity ([place]) 01/25/2018   Chest pain with low risk for  cardiac etiology 12/31/2016   Essential hypertension 12/31/2016   Hyperlipidemia 12/31/2016   DM (diabetes mellitus), type 2 with complications ([place]) 54/27/0623    REFERRING DIAG: J62.831,D17.61 (ICD-10-CM) - Chronic pain of left knee    THERAPY DIAG:  Other abnormalities of gait and mobility  Muscle weakness (generalized)  Unsteadiness on feet  Rationale for Evaluation and Treatment Rehabilitation  PERTINENT HISTORY: DM II, GERD, hyperlipidemia, diplopia, obesity, R Colles fx   PRECAUTIONS: Fall, RUE coffee cup weightbearing   SUBJECTIVE: Patient reports doing well today. No falls or near falls. Did have EMG done yesterday and per son, is a candidate for carpal tunnel release. Still waiting for ortho follow up re: OT eval.   PAIN:  Are you having pain? Yes: NPRS scale: 7/10 Pain location: LLE Pain description: burning down posterior aspect of leg; nerve pain keeping her up at night     OBJECTIVE:    TODAY'S TREATMENT:   [facility] PT Assessment - 10/22/21 0001       Standardized Balance Assessment   Standardized Balance Assessment 10 meter walk test    10 Meter Walk .27ms      Functional Gait  Assessment   Gait assessed  Yes    Gait Level Surface Walks 20 ft in less than 7 sec but greater than 5.5 sec, uses assistive device, slower speed, mild gait deviations, or deviates  6-10 in outside of the 12 in walkway width.    Change in Gait Speed Able to change speed, demonstrates mild gait deviations, deviates 6-10 in outside of the 12 in walkway width, or no gait deviations, unable to achieve a major change in velocity, or uses a change in velocity, or uses an assistive device.    Gait with Horizontal Head Turns Performs head turns with moderate changes in gait velocity, slows down, deviates 10-15 in outside 12 in walkway width but recovers, can continue to walk.    Gait with Vertical Head Turns Performs task with moderate change in gait velocity, slows down, deviates 10-15 in  outside 12 in walkway width but recovers, can continue to walk.    Gait and Pivot Turn Turns slowly, requires verbal cueing, or requires several small steps to catch balance following turn and stop    Step Over Obstacle Is able to step over one shoe box (4.5 in total height) but must slow down and adjust steps to clear box safely. May require verbal cueing.    Gait with Narrow Base of Support Ambulates less than 4 steps heel to toe or cannot perform without assistance.    Gait with Eyes Closed Walks 20 ft, slow speed, abnormal gait pattern, evidence for imbalance, deviates 10-15 in outside 12 in walkway width. Requires more than 9 sec to ambulate 20 ft.    Ambulating Backwards Walks 20 ft, slow speed, abnormal gait pattern, evidence for imbalance, deviates 10-15 in outside 12 in walkway width.    Steps Two feet to a stair, must use rail.    Total Score 11            L knee flex: 117* L knee extend: -10*  Scifit x10 mins B LE only      PATIENT EDUCATION: Education details: continue HEP  Person educated: Patient and Child(ren) Education method: Explanation Education comprehension: verbalized understanding and needs further education     HOME EXERCISE PROGRAM: Access Code: [email or web address] URL: [email or web address] Date: 10/05/2021 Prepared by: Mickie Bail Plaster  Exercises - Seated knee extension   - 1 x daily - 7 x weekly - 3 sets - 10 reps - 5 second hold - Seated March  - 1 x daily - 7 x weekly - 3 sets - 10 reps - Supine Bridge  - 1 x daily - 7 x weekly - 3 sets - 10 reps - Seated Knee Flexion Extension AROM   - 1 x daily - 7 x weekly - 3 sets - 10 reps   GOALS: Goals reviewed with patient? Yes   SHORT TERM GOALS: Target date: 11/03/2021   Pt will be independent with initial HEP for improved strength and balance  Baseline: provided  Goal status: MET   2.  Pt will improve gait speed to >/= .59ms to demonstrate improved community ambulation   Baseline: .161m;  .2429m(10/22/21) Goal status: INITIAL   3.  Pt will improve FGA to >/= 10/30 to demonstrate improved balance and reduced fall risk   Baseline: 5/30; 11/30 (10/22/21)  Goal status: MET   4.  Patient will improve L knee AROM flexion to at least 90* Baseline: 72*;  117* (7/27)  Goal status: MET   5.  Patient will improve L knee AROM extension to -15* Baseline: -22*; -10* (7/27)  Goal status: MET   LONG TERM GOALS: Target date: 12/15/2021   Pt will be independent with final HEP for improved strength and balance  Baseline: to be provided  Goal status: INITIAL   2.  Pt will improve FGA to >/= 20/30 to demonstrate improved balance and reduced fall risk   Baseline: 5/30 Goal status: INITIAL   3.  Pt will improve gait speed to >/= .38ms to demonstrate improved community ambulation   Baseline: .121m Goal status: INITIAL   4.  Patient will improve L knee AROM flexion to at least 120* Baseline: 72* Goal status: REVISED   5.  Patient will improve L knee AROM extension to better than -5* Baseline: -22* Goal status: REVISED       ASSESSMENT:   CLINICAL IMPRESSION: Patient seen for skilled PT session with emphasis on goal assessment as patients weight bearing precautions were updated and she may now safely use a RW. Her overall gait speed and confidence with gait has improved significantly with use of RW. 10 Meter Walk Test: Patient instructed to walk 10 meters (32.8 ft) as quickly and as safely as possible at their normal speed x2 and at a fast speed x2. Time measured from 2 meter mark to 8 meter mark to accommodate ramp-up and ramp-down.  Normal speed: .2411m Cut off scores: <0.4 m/s = household Ambulator, 0.4-0.8 m/s = limited community Ambulator, >0.8 m/s = community Ambulator, >1.2 m/s = crossing a street, <1.0 = increased fall risk MCID 0.05 m/s (small), 0.13 m/s (moderate), 0.06 m/s (significant)  (ANPTA Core Set of Outcome Measures for Adults with Neurologic Conditions,  2018). Patient demonstrates increased fall risk as noted by score of 11/30 on  Functional Gait Assessment.   <22/30 = predictive of falls, <20/30 = fall in 6 months, <18/30 = predictive of falls in PD MCID: 5 points stroke population, 4 points geriatric population (ANPTA Core Set of Outcome Measures for Adults with Neurologic Conditions, 2018). LTGs updated to reflect progress made thus far. Continue POC.        OBJECTIVE IMPAIRMENTS Abnormal gait, cardiopulmonary status limiting activity, decreased activity tolerance, decreased balance, decreased coordination, decreased endurance, decreased knowledge of condition, decreased knowledge of use of DME, decreased mobility, difficulty walking, decreased ROM, decreased strength, hypomobility, increased edema, impaired sensation, and obesity.    ACTIVITY LIMITATIONS carrying, lifting, bending, sitting, standing, squatting, stairs, transfers, bed mobility, and locomotion level   PARTICIPATION LIMITATIONS: meal prep, cleaning, laundry, driving, shopping, community activity, and yard work   PERSONAL FACTORS Age, Behavior pattern, Fitness, Past/current experiences, and 3+ comorbidities: hyperlipidemia, DM II, obesity  are also affecting patient's functional outcome.    REHAB POTENTIAL: Good   CLINICAL DECISION MAKING: Stable/uncomplicated   EVALUATION COMPLEXITY: Low   PLAN: PT FREQUENCY: 2x/week   PT DURATION: 10 weeks   PLANNED INTERVENTIONS: Therapeutic exercises, Therapeutic activity, Neuromuscular re-education, Balance training, Gait training, Patient/Family education, Joint mobilization, Stair training, Vestibular training, Visual/preceptual remediation/compensation, Orthotic/Fit training, DME instructions, Aquatic Therapy, Dry Needling, Electrical stimulation, Cryotherapy, Moist heat, Manual lymph drainage, Compression bandaging, Splintting, Taping, Manual therapy, and Re-evaluation   PLAN FOR NEXT SESSION: scifit for endurance and ROM, OT  referral?, L weight shifting, ambulatory balance, SPC gait tx    JenDebbora DusT, DPT JenDebbora DusT, DPT, CBIS  10/22/2021, 4:15 PM

## 2021-10-23 IMAGING — MR MR HEAD W/O CM
8 of 10 series · 36 of 48 positions shown · non-contrast
Comparison: None.

CLINICAL DATA: Worsening dizziness

EXAM:
MRI HEAD WITHOUT CONTRAST
TECHNIQUE: Multiplanar, multiecho pulse sequences of the brain and surrounding
structures were obtained without intravenous contrast.

[Series 3: DWI · axial · 3.0mm · 1.09mm/px · z∈[-37,+101]mm · 9 of 94 slices shown (1 of 4)]
[im 1/94]
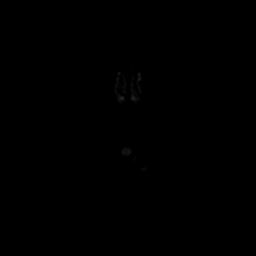
[im 12/94]
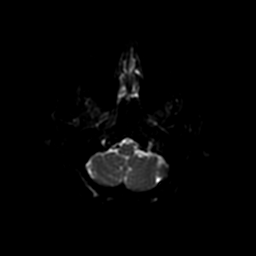
[im 24/94]
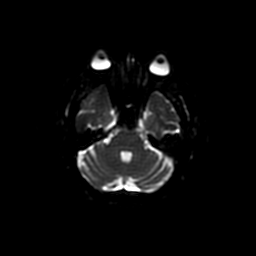
[im 35/94]
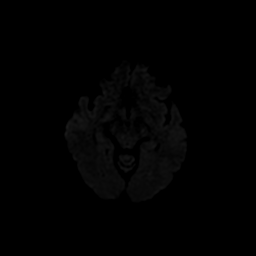
[im 47/94]
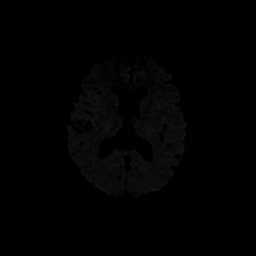
[im 59/94]
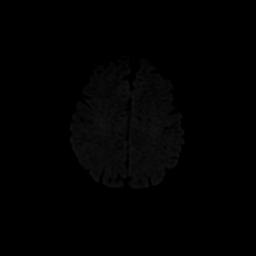
[im 70/94]
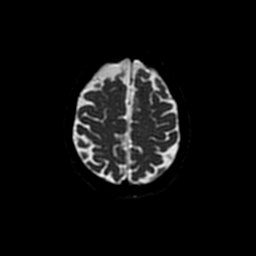
[im 82/94]
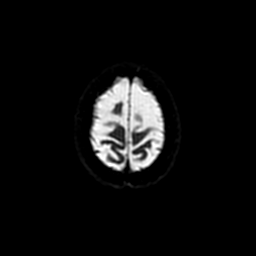
[im 94/94]
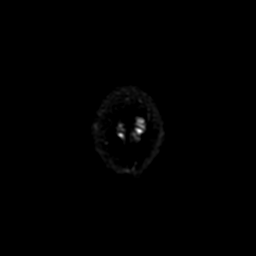

[Series 4: DWI · coronal · 5.0mm · 1.09mm/px · 6 of 72 slices shown (2 of 4)]
[im 1/72]
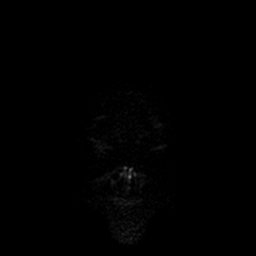
[im 15/72]
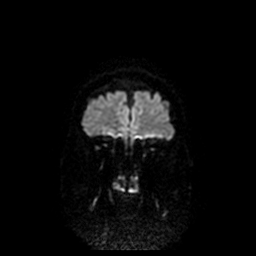
[im 29/72]
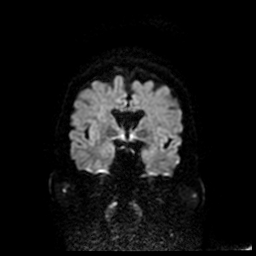
[im 43/72]
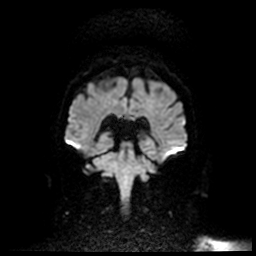
[im 57/72]
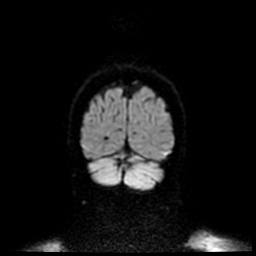
[im 72/72]
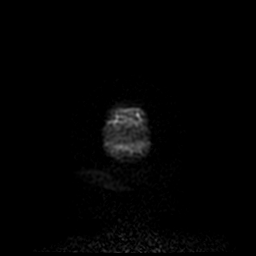

[Series 5: T1 · sagittal · 5.0mm · 0.47mm/px · 3 of 25 slices shown]
[im 1/25]
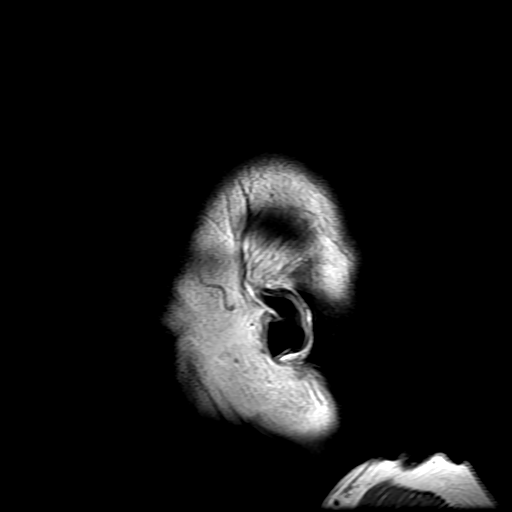
[im 13/25]
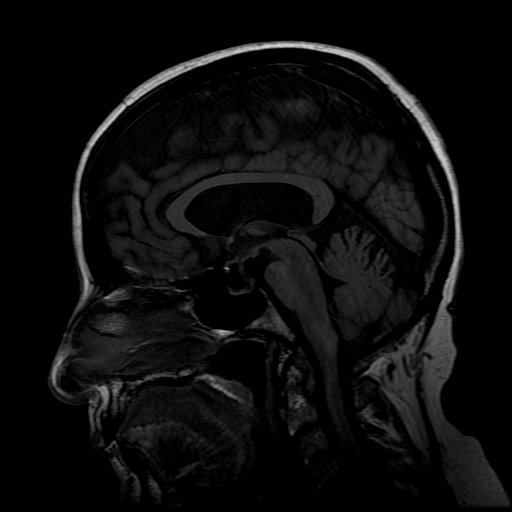
[im 25/25]
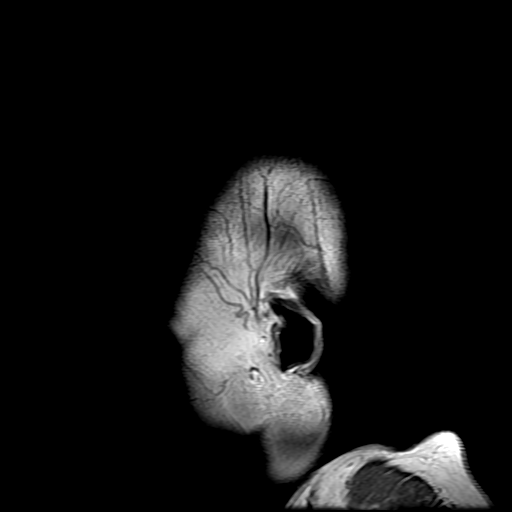

[Series 6: T2 · axial · 5.0mm · 0.43mm/px · z∈[-49,+93]mm · 3 of 25 slices shown (1 of 2)]
[im 1/25]
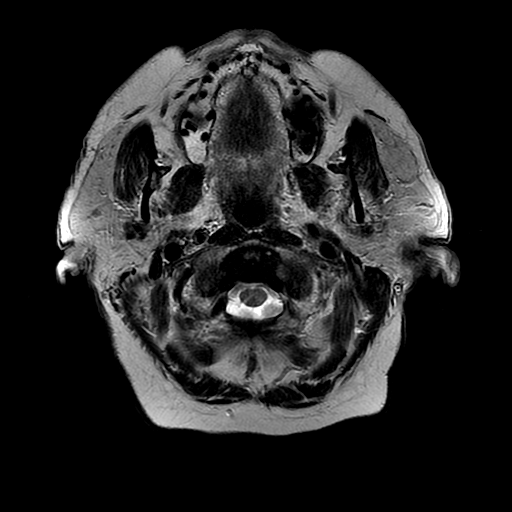
[im 13/25]
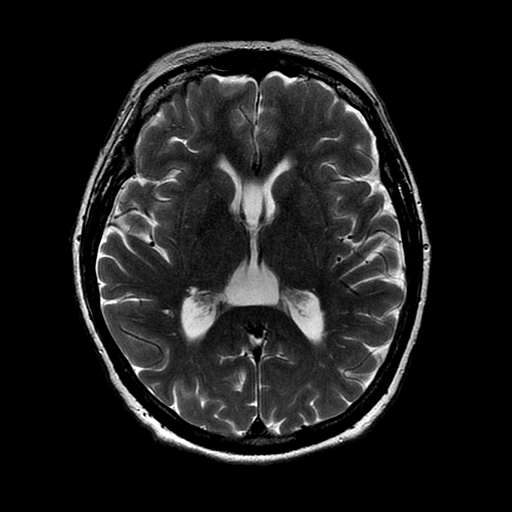
[im 25/25]
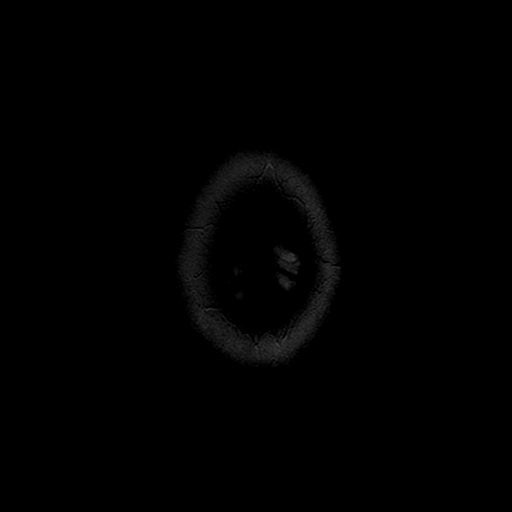

[Series 7: FLAIR · axial · 3.0mm · 0.43mm/px · z∈[-49,+93]mm · 3 of 25 slices shown]
[im 1/25]
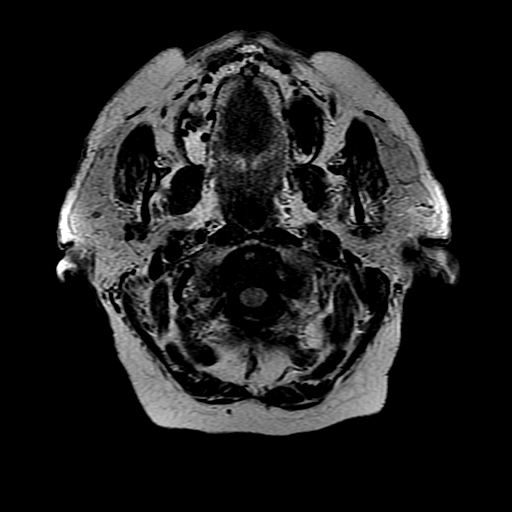
[im 13/25]
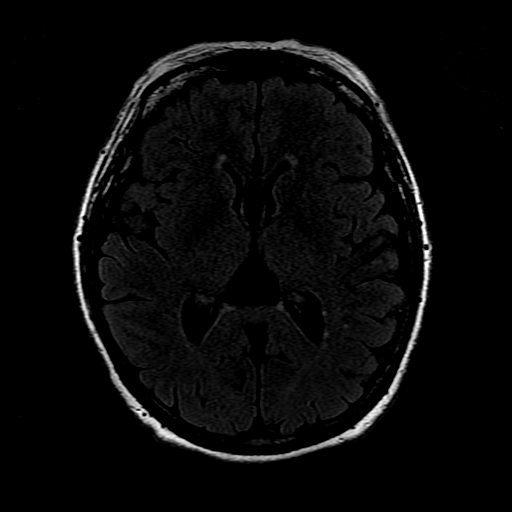
[im 25/25]
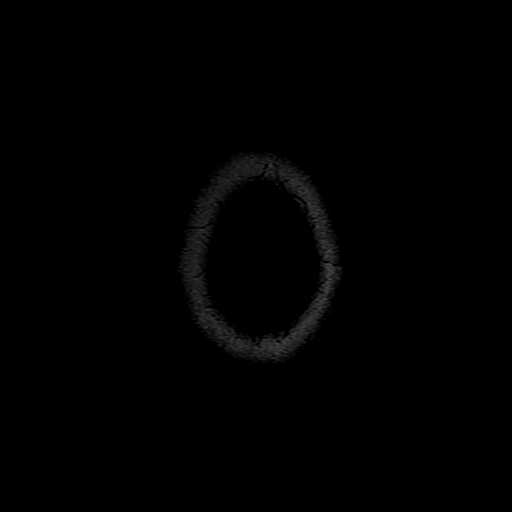

[Series 10: T2 · coronal · 5.0mm · 0.39mm/px · 3 of 27 slices shown (2 of 2)]
[im 1/27]
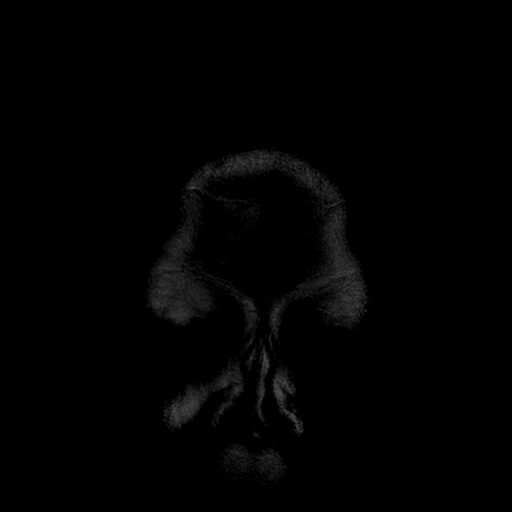
[im 14/27]
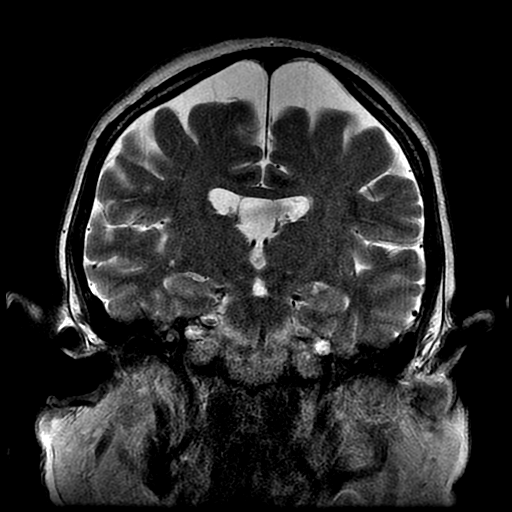
[im 27/27]
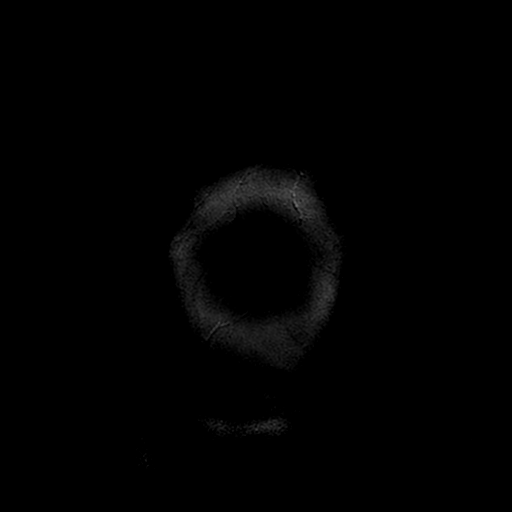

[Series 300: DWI · axial · 3.0mm · 1.09mm/px · z∈[-37,+101]mm · 5 of 47 slices shown (3 of 4)]
[im 1/47]
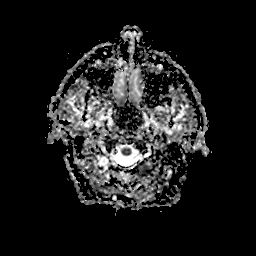
[im 12/47]
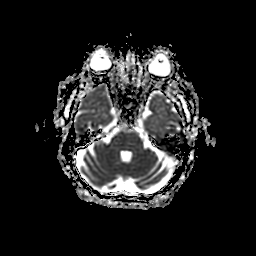
[im 24/47]
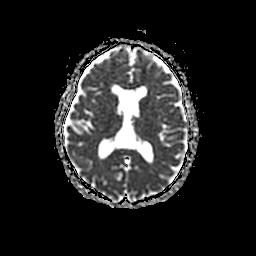
[im 35/47]
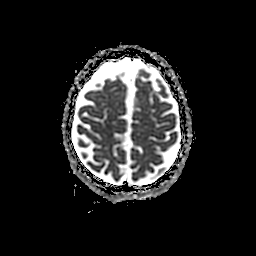
[im 47/47]
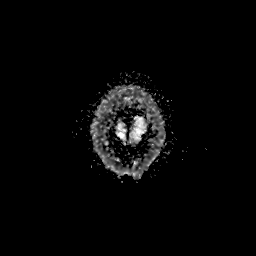

[Series 400: DWI · coronal · 5.0mm · 1.09mm/px · 4 of 36 slices shown (4 of 4)]
[im 1/36]
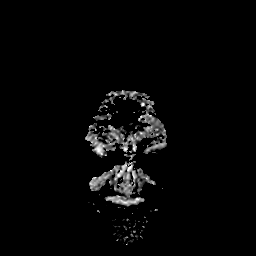
[im 12/36]
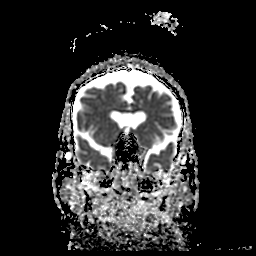
[im 24/36]
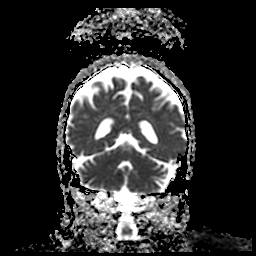
[im 36/36]
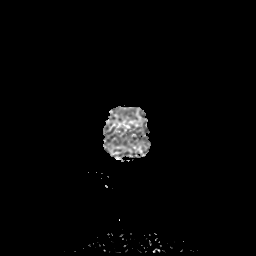

[36 of 48 positions shown; findings below may reference images not displayed]

FINDINGS: Brain: Small subcentimeter foci of restricted diffusion are present
within the right cerebellar hemisphere. There is no evidence of
intracranial hemorrhage. Patchy small foci of T2 hyperintensity in
the supratentorial white matter are nonspecific but may reflect
minor chronic microvascular ischemic changes. There is no
intracranial mass, mass effect, or edema. There is no hydrocephalus
or extra-axial fluid collection. Ventricles and sulci are within
normal limits in size and configuration, noting presence of a cavum
septum pellucidum et vergae.

Vascular: Major vessel flow voids at the skull base are preserved.

Skull and upper cervical spine: Normal marrow signal is preserved.

Sinuses/Orbits: Minor mucosal thickening.  Orbits are unremarkable.

Other: Sella is unremarkable.  Mastoid air cells are clear.
IMPRESSION: Multiple small acute right cerebellar infarcts.

Minor chronic microvascular ischemic changes.

## 2021-10-26 ENCOUNTER — Ambulatory Visit (INDEPENDENT_AMBULATORY_CARE_PROVIDER_SITE_OTHER): Payer: HMO

## 2021-10-26 ENCOUNTER — Ambulatory Visit: Payer: HMO

## 2021-10-26 DIAGNOSIS — R2689 Other abnormalities of gait and mobility: Secondary | ICD-10-CM | POA: Diagnosis not present

## 2021-10-26 DIAGNOSIS — M6281 Muscle weakness (generalized): Secondary | ICD-10-CM

## 2021-10-26 DIAGNOSIS — R2681 Unsteadiness on feet: Secondary | ICD-10-CM

## 2021-10-26 DIAGNOSIS — I480 Paroxysmal atrial fibrillation: Secondary | ICD-10-CM

## 2021-10-26 NOTE — Therapy (Signed)
OUTPATIENT PHYSICAL THERAPY TREATMENT NOTE   Patient Name: [name] MRN: [id number] DOB:Jul 19, 1945, 76 y.o., female Today's Date: 10/26/2021  PCP: Orpah Melter, MD  REFERRING PROVIDER: Larena Glassman, MD   END OF SESSION:   PT End of Session - 10/26/21 1545     Visit Number 7    Number of Visits 21    Date for PT Re-Evaluation 12/15/21    Authorization Type [employer]    Progress Note Due on Visit 10    PT Start Time 1533    PT Stop Time 1615    PT Time Calculation (min) 42 min    Activity Tolerance Patient tolerated treatment well    Behavior During Therapy WFL for tasks assessed/performed             Past Medical History:  Diagnosis Date   Actinic keratosis    Followed by Dr. Allyson Sabal   Anxiety    Diabetes mellitus, type II, insulin dependent ([place])    Associated with obesity: She takes Lantus, Victoza & Actos   Diplopia    Essential hypertension    GERD (gastroesophageal reflux disease)    Hyperlipidemia    Morbid obesity with BMI of 40.0-44.9, adult [facility])    Past Surgical History:  Procedure Laterality Date   BREAST BIOPSY  1997   LEFT HEART CATH AND CORONARY ANGIOGRAPHY N/A 01/05/2017   Procedure: LEFT HEART CATH AND CORONARY ANGIOGRAPHY;  Surgeon: Leonie Man, MD;  Location: [place] [facility];  Service: Cardiovascular: Angiographically normal coronary arteries with a right dominant system.  Normal EF 55-65%.   LOOP RECORDER INSERTION N/A 08/01/2019   Procedure: LOOP RECORDER INSERTION;  Surgeon: Constance Haw, MD;  Location: [place] [facility];  Service: Cardiovascular;  Laterality: N/A;   Patient Active Problem List   Diagnosis Date Noted   Paroxysmal atrial fibrillation ([place]) 08/08/2019   Secondary hypercoagulable state ([place]) 08/08/2019   Acute cerebrovascular accident of cerebellum ([place]) 07/31/2019   GERD (gastroesophageal reflux disease)    Leukocytosis    Morbid obesity ([place]) 01/25/2018   Chest pain with low risk for  cardiac etiology 12/31/2016   Essential hypertension 12/31/2016   Hyperlipidemia 12/31/2016   DM (diabetes mellitus), type 2 with complications ([place]) 84/13/2440    REFERRING DIAG: N02.725,D66.44 (ICD-10-CM) - Chronic pain of left knee    THERAPY DIAG:  Other abnormalities of gait and mobility  Muscle weakness (generalized)  Unsteadiness on feet  Rationale for Evaluation and Treatment Rehabilitation  PERTINENT HISTORY: DM II, GERD, hyperlipidemia, diplopia, obesity, R Colles fx   PRECAUTIONS: Fall, RUE coffee cup weightbearing   SUBJECTIVE: Patient reports doing well- no falls. Does report that R wrist seems to have new "protruding" piece to distal radial side. Denies pain to touch, limited in AROM at baseline.  PAIN:  Are you having pain? Yes: NPRS scale: 7/10 Pain location: LLE Pain description: burning down posterior aspect of leg; nerve pain keeping her up at night     OBJECTIVE:    TODAY'S TREATMENT:  Gait:   -115' RW + spv (decreased height of RW, but patient prefers taller height)   -obstacle course: weaving thru cones-> toe taps to 4" box    Therex:    -B HSC 2x10   -B hip extension 2x10   -scifit x8 mins     PATIENT EDUCATION: Education details: continue HEP, appropriate sizing of DME Person educated: Patient and Child(ren) Education method: Explanation Education comprehension: verbalized understanding and needs further education  HOME EXERCISE PROGRAM: Access Code: [email or web address] URL: [email or web address] Date: 10/05/2021 Prepared by: Mickie Bail Plaster  Exercises - Seated knee extension   - 1 x daily - 7 x weekly - 3 sets - 10 reps - 5 second hold - Seated March  - 1 x daily - 7 x weekly - 3 sets - 10 reps - Supine Bridge  - 1 x daily - 7 x weekly - 3 sets - 10 reps - Seated Knee Flexion Extension AROM   - 1 x daily - 7 x weekly - 3 sets - 10 reps   GOALS: Goals reviewed with patient? Yes   SHORT TERM GOALS: Target date: 11/03/2021    Pt will be independent with initial HEP for improved strength and balance  Baseline: provided  Goal status: MET   2.  Pt will improve gait speed to >/= .81ms to demonstrate improved community ambulation   Baseline: .176m; .2414m(10/22/21) Goal status: INITIAL   3.  Pt will improve FGA to >/= 10/30 to demonstrate improved balance and reduced fall risk   Baseline: 5/30; 11/30 (10/22/21)  Goal status: MET   4.  Patient will improve L knee AROM flexion to at least 90* Baseline: 72*;  117* (7/27)  Goal status: MET   5.  Patient will improve L knee AROM extension to -15* Baseline: -22*; -10* (7/27)  Goal status: MET   LONG TERM GOALS: Target date: 12/15/2021   Pt will be independent with final HEP for improved strength and balance  Baseline: to be provided Goal status: INITIAL   2.  Pt will improve FGA to >/= 20/30 to demonstrate improved balance and reduced fall risk   Baseline: 5/30 Goal status: INITIAL   3.  Pt will improve gait speed to >/= .50m25mo demonstrate improved community ambulation   Baseline: .46m/34mal status: INITIAL   4.  Patient will improve L knee AROM flexion to at least 120* Baseline: 72* Goal status: REVISED   5.  Patient will improve L knee AROM extension to better than -5* Baseline: -22* Goal status: REVISED       ASSESSMENT:   CLINICAL IMPRESSION: Patient seen for skilled PT session with emphasis on gait retraining with AD and functional LE strength. Patient appearing much more confident with improved gait mechanics when using RW. She does prefer to keep it higher than recommended, but is still safe with it. Her turning with the RW and limited by poor grip strength and digit ROM in R hand from recent fx. Continue POC.       OBJECTIVE IMPAIRMENTS Abnormal gait, cardiopulmonary status limiting activity, decreased activity tolerance, decreased balance, decreased coordination, decreased endurance, decreased knowledge of condition, decreased  knowledge of use of DME, decreased mobility, difficulty walking, decreased ROM, decreased strength, hypomobility, increased edema, impaired sensation, and obesity.    ACTIVITY LIMITATIONS carrying, lifting, bending, sitting, standing, squatting, stairs, transfers, bed mobility, and locomotion level   PARTICIPATION LIMITATIONS: meal prep, cleaning, laundry, driving, shopping, community activity, and yard work   PERSONAL FACTORS Age, Behavior pattern, Fitness, Past/current experiences, and 3+ comorbidities: hyperlipidemia, DM II, obesity  are also affecting patient's functional outcome.    REHAB POTENTIAL: Good   CLINICAL DECISION MAKING: Stable/uncomplicated   EVALUATION COMPLEXITY: Low   PLAN: PT FREQUENCY: 2x/week   PT DURATION: 10 weeks   PLANNED INTERVENTIONS: Therapeutic exercises, Therapeutic activity, Neuromuscular re-education, Balance training, Gait training, Patient/Family education, Joint mobilization, Stair training, Vestibular training, Visual/preceptual remediation/compensation, Orthotic/Fit training, DME instructions, Aquatic Therapy, Dry Needling,  Electrical stimulation, Cryotherapy, Moist heat, Manual lymph drainage, Compression bandaging, Splintting, Taping, Manual therapy, and Re-evaluation   PLAN FOR NEXT SESSION: scifit for endurance and ROM, L weight shifting, ambulatory balance, standing balance/gait on compliant surfaces   Debbora Dus, PT, DPT Debbora Dus, PT, DPT, CBIS  10/26/2021, 4:19 PM

## 2021-10-28 DIAGNOSIS — E782 Mixed hyperlipidemia: Secondary | ICD-10-CM | POA: Diagnosis not present

## 2021-10-28 DIAGNOSIS — K219 Gastro-esophageal reflux disease without esophagitis: Secondary | ICD-10-CM | POA: Diagnosis not present

## 2021-10-28 DIAGNOSIS — I480 Paroxysmal atrial fibrillation: Secondary | ICD-10-CM | POA: Diagnosis not present

## 2021-10-28 DIAGNOSIS — I10 Essential (primary) hypertension: Secondary | ICD-10-CM | POA: Diagnosis not present

## 2021-10-28 DIAGNOSIS — E1142 Type 2 diabetes mellitus with diabetic polyneuropathy: Secondary | ICD-10-CM | POA: Diagnosis not present

## 2021-10-29 ENCOUNTER — Ambulatory Visit: Payer: HMO | Admitting: Occupational Therapy

## 2021-10-29 ENCOUNTER — Ambulatory Visit: Payer: HMO | Attending: Orthopedic Surgery

## 2021-10-29 ENCOUNTER — Encounter: Payer: Self-pay | Admitting: Occupational Therapy

## 2021-10-29 ENCOUNTER — Other Ambulatory Visit: Payer: Self-pay

## 2021-10-29 ENCOUNTER — Telehealth: Payer: Self-pay | Admitting: Orthopedic Surgery

## 2021-10-29 DIAGNOSIS — M25531 Pain in right wrist: Secondary | ICD-10-CM | POA: Insufficient documentation

## 2021-10-29 DIAGNOSIS — M25641 Stiffness of right hand, not elsewhere classified: Secondary | ICD-10-CM | POA: Insufficient documentation

## 2021-10-29 DIAGNOSIS — R2689 Other abnormalities of gait and mobility: Secondary | ICD-10-CM | POA: Insufficient documentation

## 2021-10-29 DIAGNOSIS — M6281 Muscle weakness (generalized): Secondary | ICD-10-CM

## 2021-10-29 DIAGNOSIS — G5601 Carpal tunnel syndrome, right upper limb: Secondary | ICD-10-CM

## 2021-10-29 DIAGNOSIS — M25631 Stiffness of right wrist, not elsewhere classified: Secondary | ICD-10-CM | POA: Insufficient documentation

## 2021-10-29 DIAGNOSIS — R278 Other lack of coordination: Secondary | ICD-10-CM | POA: Diagnosis not present

## 2021-10-29 DIAGNOSIS — R2681 Unsteadiness on feet: Secondary | ICD-10-CM | POA: Diagnosis not present

## 2021-10-29 DIAGNOSIS — Z01818 Encounter for other preprocedural examination: Secondary | ICD-10-CM

## 2021-10-29 DIAGNOSIS — R600 Localized edema: Secondary | ICD-10-CM | POA: Insufficient documentation

## 2021-10-29 NOTE — Therapy (Signed)
OUTPATIENT OCCUPATIONAL THERAPY ORTHO EVALUATION  Patient Name: [name] MRN: [id number] DOB:[birth year], 76 y.o., female Today's Date: 10/29/2021  PCP: Orpah Melter, MD REFERRING PROVIDER: Mordecai Rasmussen, MD    OT End of Session - 10/29/21 1201     Visit Number 1    Number of Visits 16    Date for OT Re-Evaluation 12/29/21    Authorization Type [employer]    Progress Note Due on Visit 10    OT Start Time 0845    OT Stop Time 0930    OT Time Calculation (min) 45 min    Activity Tolerance Patient tolerated treatment well    Behavior During Therapy WFL for tasks assessed/performed             Past Medical History:  Diagnosis Date   Actinic keratosis    Followed by Dr. Allyson Sabal   Anxiety    Diabetes mellitus, type II, insulin dependent ([place])    Associated with obesity: She takes Lantus, Victoza & Actos   Diplopia    Essential hypertension    GERD (gastroesophageal reflux disease)    Hyperlipidemia    Morbid obesity with BMI of 40.0-44.9, adult [facility])    Past Surgical History:  Procedure Laterality Date   BREAST BIOPSY  1997   LEFT HEART CATH AND CORONARY ANGIOGRAPHY N/A 01/05/2017   Procedure: LEFT HEART CATH AND CORONARY ANGIOGRAPHY;  Surgeon: Leonie Man, MD;  Location: [place] [facility];  Service: Cardiovascular: Angiographically normal coronary arteries with a right dominant system.  Normal EF 55-65%.   LOOP RECORDER INSERTION N/A 08/01/2019   Procedure: LOOP RECORDER INSERTION;  Surgeon: Constance Haw, MD;  Location: [place] [facility];  Service: Cardiovascular;  Laterality: N/A;   Patient Active Problem List   Diagnosis Date Noted   Paroxysmal atrial fibrillation ([place]) 08/08/2019   Secondary hypercoagulable state ([place]) 08/08/2019   Acute cerebrovascular accident of cerebellum ([place]) 07/31/2019   GERD (gastroesophageal reflux disease)    Leukocytosis    Morbid obesity ([place]) 01/25/2018   Chest pain with low risk for cardiac  etiology 12/31/2016   Essential hypertension 12/31/2016   Hyperlipidemia 12/31/2016   DM (diabetes mellitus), type 2 with complications ([place]) 54/65/0354    ONSET DATE: 10/20/2021 (referral date)   REFERRING DIAG: S52.531D (ICD-10-CM) - Closed Colles' fracture of right radius with routine healing, subsequent encounter (casted for 6 weeks)  Pt also has significant CTS - will have surgery in future  **Note from MD: I have also placed a referral for occupational therapy, to work on range of motion of her right hand. No restrictions at this time. She also has severe right carpal tunnel syndrome, with advanced findings on EMG   THERAPY DIAG:  Stiffness of right wrist, not elsewhere classified  Stiffness of right hand, not elsewhere classified  Muscle weakness (generalized)  Other lack of coordination  Pain in right wrist  Unsteadiness on feet  Rationale for Evaluation and Treatment Rehabilitation  SUBJECTIVE:   SUBJECTIVE STATEMENT: I drops things and can't hold much of anything in my Rt hand Pt accompanied by:  son  PERTINENT HISTORY: Rt distal radius fx 08/06/21 (casted 6 weeks), CTS, DM, HTN, A-fib, CVA 2021  PRECAUTIONS: Fall and Other: Loop recorder  WEIGHT BEARING RESTRICTIONS No  PAIN:  Are you having pain? Yes: NPRS scale: 6/10 Pain location: Rt ulnar wrist and all fingertips except small finger Pain description: achy at wrist, numbness at fingers Aggravating factors: nothing, but worse at  night Relieving factors: nothing  FALLS: Has patient fallen in last 6 months? Yes. Number of falls 1  LIVING ENVIRONMENT: Lives with: lives with their family Pt lives in 1 story home w/ basement, 5 steps to enter house Has following equipment at home: Lobbyist, Environmental consultant - 2 wheeled, shower chair, and Grab bars  PLOF: Independent  PATIENT GOALS hold things in Rt hand better to write and eat, improve movement and strength Rt hand  OBJECTIVE:   HAND DOMINANCE:  Right  ADLs: Overall ADLs: Mod I using Lt non dominant hand as dominant hand Transfers/ambulation related to ADLs: Eating: using Lt hand Grooming: using Lt hand UB Dressing: mod I  LB Dressing: mod I w/ slip on shoes, elastic pants Toileting: mod I (Lt hand to wipe)  Bathing: mod I Tub Shower transfers: Mod I w/ grab bars and shower seat   UPPER EXTREMITY ROM   BUE AROM at shoulders and elbows WFLs. Composite finger flexion approx 50% w/ MP flexion WFL's, tighter at PIP joints  Active ROM Right eval Left eval  Shoulder flexion    Shoulder abduction    Shoulder adduction    Shoulder extension    Shoulder internal rotation    Shoulder external rotation    Elbow flexion    Elbow extension    Wrist flexion 35   Wrist extension 28   Wrist ulnar deviation    Wrist radial deviation    Wrist pronation WFLs   Wrist supination 75   (Blank rows = not tested)  Active ROM Right eval Left eval  Thumb MCP (0-60)    Thumb IP (0-80)    Thumb Radial abd/add (0-55)     Thumb Palmar abd/add (0-45) 65 70   Thumb Opposition to Small Finger     Index MCP (0-90)     Index PIP (0-100) 65    Index DIP (0-70)      Long MCP (0-90)      Long PIP (0-100) 70     Long DIP (0-70)      Ring MCP (0-90)      Ring PIP (0-100) 72     Ring DIP (0-70)      Little MCP (0-90)      Little PIP (0-100) 55     Little DIP (0-70)      (Blank rows = not tested)    HAND FUNCTION: Grip strength: Right: 4.8 lbs; Left: 33.5 lbs, Lateral pinch: Right: 3 lbs, Left: 11 lbs, and 3 point pinch: Right: 1 lbs, Left: 11 lbs  COORDINATION: 9 Hole Peg test: Right: 70.47 sec; Left: 24.46 sec Handwriting: Pt can write name 100% legible in cursive and print w/ regular pen but slow and difficult  SENSATION: Numbness in first 3 fingers and radial side of ring finger consistent w/ CTS  EDEMA: Mild in forearm and base of dorsal thumb    OBSERVATIONS: Pt also w/ arthritis in hands. Tightness more in PIP joints  than MP joints of fingers Rt hand. Also noted thumb CMC instability (? From arthritis)    TODAY'S TREATMENT:  Pt shown A/ROM for composite flexion, MP flexion, and isolated IP flexion, and passive IP flexion - no handout provided today d/t time constraints  Pt instructed to use Rt dominant hand for : eating, writing, brushing teeth and hair, and for assistance w/ bathing and dressing Pt practiced writing name - was able to do with regular pen Pt instructed NOT to use Rt hand for  anything: heavy, hot, sharp, or breakable   PATIENT EDUCATION: Education details: A/ROM for Rt hand, forced return of Rt hand for BADLS Person educated: Patient and SON Education method: Explanation Education comprehension: verbalized understanding   HOME EXERCISE PROGRAM: No HEP formally issued today d/t time constraints  GOALS:  SHORT TERM GOALS: Target date: 11/29/21   Independent with HEP for wrist and hand ROM and hand strengthening Baseline: Goal status: INITIAL  2.  Pt to increase Rt wrist flex by 5* or greater and extension by 10* or greater Baseline: flex = 35*, ext = 28* Goal status: INITIAL  3.  Pt to improve PIP flexion digits 2-5 Rt hand by 10* or greater Baseline: 65*, 70*, 72*, 55*  Goal status: INITIAL  4.  Pt to return to using Rt hand as dominant hand for BADLS including writing and eating Baseline: using Lt non dominant hand Goal status: INITIAL  5.  Pt to oppose Rt thumb to 5th digit  Baseline: 3rd digit only Goal status: INITIAL  6.  Pt to improve Rt grip strength to 8 lbs or greater Baseline: 4 lbs Goal status: INITIAL  LONG TERM GOALS: Target date: 12/29/21    Improve coordination Rt hand as evidenced by performing 9 hole peg test in 55 sec or less Baseline: 70.47 sec Goal status: INITIAL  2.  Pt to improve grip strength Rt hand to 15 lbs  Baseline: 4 lbs Goal status: INITIAL  3.  Pt to improve lateral and tip pinch Rt hand by 3 lbs each Baseline: Lat = 3, 3 tip  = 1 Goal status: INITIAL  4.  Pt to verbalize understanding of pain reduction strategies for Rt hand and wrist Baseline:  Goal status: INITIAL  5.  Pt to be able to lift and hold 3 lb object Rt hand w/o drops Baseline:  Goal status: INITIAL  ASSESSMENT:  CLINICAL IMPRESSION: Patient is a 76 y.o. female who was seen today for occupational therapy evaluation for Rt hand and wrist ROM and strengthening s/p closed Colles fracture (distal radius fx). Pt was casted for 6 weeks and is now healed. Pt also w/ significant CTS Rt hand w/ impending surgery. Pt walks w/ walker and is a fall risk. Pt currently seeing P.T.   PERFORMANCE DEFICITS in functional skills including ADLs, IADLs, coordination, dexterity, sensation, edema, ROM, strength, pain, flexibility, FMC, GMC, balance, decreased knowledge of use of DME, and UE functional use.   IMPAIRMENTS are limiting patient from ADLs, IADLs, and leisure.   COMORBIDITIES has co-morbidities such as CTS  that affects occupational performance. Patient will benefit from skilled OT to address above impairments and improve overall function.  MODIFICATION OR ASSISTANCE TO COMPLETE EVALUATION: No modification of tasks or assist necessary to complete an evaluation.  OT OCCUPATIONAL PROFILE AND HISTORY: Problem focused assessment: Including review of records relating to presenting problem.  CLINICAL DECISION MAKING: Moderate - several treatment options, min-mod task modification necessary  REHAB POTENTIAL: Fair time since onset, severe CTS  EVALUATION COMPLEXITY: Moderate      PLAN: OT FREQUENCY: 2x/week  OT DURATION: 8 weeks  PLANNED INTERVENTIONS: self care/ADL training, therapeutic exercise, therapeutic activity, manual therapy, passive range of motion, functional mobility training, splinting, electrical stimulation, ultrasound, paraffin, fluidotherapy, moist heat, cryotherapy, contrast bath, patient/family education, coping strategies training,  DME and/or AE instructions, and Re-evaluation  RECOMMENDED OTHER SERVICES: NONE at this time  CONSULTED AND AGREED WITH PLAN OF CARE: Patient and family member/caregiver  PLAN FOR NEXT SESSION: Issue formal  HEP Rt hand for tendon gliding, P/ROM, and yellow putty for grip and pinch strength, and wrist active and passive ROM   Hans Eden, OT 10/29/2021, 12:03 PM

## 2021-10-29 NOTE — Telephone Encounter (Signed)
Called pt and let her know some dates we can consider for surgery. States she will call back to give definitive answer after she speaks with family.

## 2021-10-29 NOTE — Therapy (Signed)
OUTPATIENT PHYSICAL THERAPY TREATMENT NOTE   Patient Name: [name] MRN: [id number] DOB:December 03, 1945, 76 y.o., female Today's Date: 10/29/2021  PCP: Orpah Melter, MD  REFERRING PROVIDER: Larena Glassman, MD   END OF SESSION:   PT End of Session - 10/29/21 1017     Visit Number 8    Number of Visits 21    Date for PT Re-Evaluation 12/15/21    Authorization Type [employer]    Progress Note Due on Visit 10    PT Start Time 1017    PT Stop Time 1058    PT Time Calculation (min) 41 min    Activity Tolerance Patient tolerated treatment well    Behavior During Therapy WFL for tasks assessed/performed             Past Medical History:  Diagnosis Date   Actinic keratosis    Followed by Dr. Allyson Sabal   Anxiety    Diabetes mellitus, type II, insulin dependent ([place])    Associated with obesity: She takes Lantus, Victoza & Actos   Diplopia    Essential hypertension    GERD (gastroesophageal reflux disease)    Hyperlipidemia    Morbid obesity with BMI of 40.0-44.9, adult [facility])    Past Surgical History:  Procedure Laterality Date   BREAST BIOPSY  1997   LEFT HEART CATH AND CORONARY ANGIOGRAPHY N/A 01/05/2017   Procedure: LEFT HEART CATH AND CORONARY ANGIOGRAPHY;  Surgeon: Leonie Man, MD;  Location: [place] [facility];  Service: Cardiovascular: Angiographically normal coronary arteries with a right dominant system.  Normal EF 55-65%.   LOOP RECORDER INSERTION N/A 08/01/2019   Procedure: LOOP RECORDER INSERTION;  Surgeon: Constance Haw, MD;  Location: [place] [facility];  Service: Cardiovascular;  Laterality: N/A;   Patient Active Problem List   Diagnosis Date Noted   Paroxysmal atrial fibrillation ([place]) 08/08/2019   Secondary hypercoagulable state ([place]) 08/08/2019   Acute cerebrovascular accident of cerebellum ([place]) 07/31/2019   GERD (gastroesophageal reflux disease)    Leukocytosis    Morbid obesity ([place]) 01/25/2018   Chest pain with low risk for  cardiac etiology 12/31/2016   Essential hypertension 12/31/2016   Hyperlipidemia 12/31/2016   DM (diabetes mellitus), type 2 with complications ([place]) 14/43/1540    REFERRING DIAG: G86.761,P50.93 (ICD-10-CM) - Chronic pain of left knee    THERAPY DIAG:  Other abnormalities of gait and mobility  Muscle weakness (generalized)  Unsteadiness on feet  Rationale for Evaluation and Treatment Rehabilitation  PERTINENT HISTORY: DM II, GERD, hyperlipidemia, diplopia, obesity, R Colles fx   PRECAUTIONS: Fall, RUE coffee cup weightbearing   SUBJECTIVE: Patient reports doing well- no falls. Does report that R wrist seems to have new "protruding" piece to distal radial side. Denies pain to touch, limited in AROM at baseline.  PAIN:  Are you having pain? Yes: NPRS scale: 7/10 Pain location: LLE Pain description: burning down posterior aspect of leg; nerve pain keeping her up at night     OBJECTIVE:    TODAY'S TREATMENT:  Therex:  -scifit level 1 x10 mins  -HEP (see below)    PATIENT EDUCATION: Education details: continue HEP Person educated: Patient and Child(ren) Education method: Explanation Education comprehension: verbalized understanding and needs further education     HOME EXERCISE PROGRAM: Access Code: [email or web address] URL: [email or web address] Date: 10/29/2021 Prepared by: Estevan Ryder  Exercises - Seated knee extension   - 1 x daily - 7 x weekly - 3 sets - 10 reps - 5 second hold -  Supine Bridge  - 1 x daily - 7 x weekly - 3 sets - 10 reps - Seated Knee Flexion Extension AROM   - 1 x daily - 7 x weekly - 3 sets - 10 reps - Standing March with Counter Support  - 1 x daily - 7 x weekly - 3 sets - 10 reps - Standing Hip Abduction with Counter Support  - 1 x daily - 7 x weekly - 3 sets - 10 reps - Standing Hip Extension with Counter Support  - 1 x daily - 7 x weekly - 3 sets - 10 reps - Side Stepping with Counter Support  - 1 x daily - 7 x weekly - 3 sets - 10  reps - Backward Walking with Counter Support  - 1 x daily - 7 x weekly - 3 sets - 10 reps - Heel Raises with Counter Support  - 1 x daily - 7 x weekly - 3 sets - 10 reps - Seated Gastroc Stretch with Strap  - 1 x daily - 7 x weekly - 3 sets - 10 reps   GOALS: Goals reviewed with patient? Yes   SHORT TERM GOALS: Target date: 11/03/2021   Pt will be independent with initial HEP for improved strength and balance  Baseline: provided  Goal status: MET   2.  Pt will improve gait speed to >/= .1ms to demonstrate improved community ambulation   Baseline: .123m; .2435m(10/22/21) Goal status: INITIAL   3.  Pt will improve FGA to >/= 10/30 to demonstrate improved balance and reduced fall risk   Baseline: 5/30; 11/30 (10/22/21)  Goal status: MET   4.  Patient will improve L knee AROM flexion to at least 90* Baseline: 72*;  117* (7/27)  Goal status: MET   5.  Patient will improve L knee AROM extension to -15* Baseline: -22*; -10* (7/27)  Goal status: MET   LONG TERM GOALS: Target date: 12/15/2021   Pt will be independent with final HEP for improved strength and balance  Baseline: to be provided Goal status: INITIAL   2.  Pt will improve FGA to >/= 20/30 to demonstrate improved balance and reduced fall risk   Baseline: 5/30 Goal status: INITIAL   3.  Pt will improve gait speed to >/= .23m12mo demonstrate improved community ambulation   Baseline: .19m/87mal status: INITIAL   4.  Patient will improve L knee AROM flexion to at least 120* Baseline: 72* Goal status: REVISED   5.  Patient will improve L knee AROM extension to better than -5* Baseline: -22* Goal status: REVISED       ASSESSMENT:   CLINICAL IMPRESSION: Patient seen for skilled PT session with emphasis on updating HEP. Patient tolerating progression to standing exercises well with intermittent seated rest breaks.  PT instructing patient to complete standing HEP with someone else present and a chair nearby to  allow for seated rest breaks. Patient still with some fear avoidance behaviors declining use of U UE for support though she likely could complete these exercises with just single UE. Continue POC.     OBJECTIVE IMPAIRMENTS Abnormal gait, cardiopulmonary status limiting activity, decreased activity tolerance, decreased balance, decreased coordination, decreased endurance, decreased knowledge of condition, decreased knowledge of use of DME, decreased mobility, difficulty walking, decreased ROM, decreased strength, hypomobility, increased edema, impaired sensation, and obesity.    ACTIVITY LIMITATIONS carrying, lifting, bending, sitting, standing, squatting, stairs, transfers, bed mobility, and locomotion level   PARTICIPATION LIMITATIONS: meal prep,  cleaning, laundry, driving, shopping, community activity, and yard work   PERSONAL FACTORS Age, Behavior pattern, Fitness, Past/current experiences, and 3+ comorbidities: hyperlipidemia, DM II, obesity  are also affecting patient's functional outcome.    REHAB POTENTIAL: Good   CLINICAL DECISION MAKING: Stable/uncomplicated   EVALUATION COMPLEXITY: Low   PLAN: PT FREQUENCY: 2x/week   PT DURATION: 10 weeks   PLANNED INTERVENTIONS: Therapeutic exercises, Therapeutic activity, Neuromuscular re-education, Balance training, Gait training, Patient/Family education, Joint mobilization, Stair training, Vestibular training, Visual/preceptual remediation/compensation, Orthotic/Fit training, DME instructions, Aquatic Therapy, Dry Needling, Electrical stimulation, Cryotherapy, Moist heat, Manual lymph drainage, Compression bandaging, Splintting, Taping, Manual therapy, and Re-evaluation   PLAN FOR NEXT SESSION: scifit for endurance and ROM, L weight shifting, ambulatory balance, standing balance/gait on compliant surfaces, confidence with mobility!    Debbora Dus, PT, DPT Debbora Dus, PT, DPT, CBIS  10/29/2021, 10:58 AM

## 2021-10-29 NOTE — Telephone Encounter (Signed)
Call received from patient inquiring about status of surgery to be scheduled for carpal tunnel? Ph# [phone]

## 2021-10-29 NOTE — Telephone Encounter (Signed)
Patient called back to let you know that the 17th is fine for her CT Surgery.

## 2021-11-02 ENCOUNTER — Other Ambulatory Visit: Payer: Self-pay | Admitting: Orthopedic Surgery

## 2021-11-02 ENCOUNTER — Telehealth: Payer: Self-pay

## 2021-11-02 ENCOUNTER — Ambulatory Visit: Payer: HMO | Admitting: Physical Therapy

## 2021-11-02 DIAGNOSIS — R2689 Other abnormalities of gait and mobility: Secondary | ICD-10-CM

## 2021-11-02 DIAGNOSIS — R2681 Unsteadiness on feet: Secondary | ICD-10-CM

## 2021-11-02 DIAGNOSIS — M6281 Muscle weakness (generalized): Secondary | ICD-10-CM

## 2021-11-02 NOTE — Therapy (Signed)
OUTPATIENT PHYSICAL THERAPY TREATMENT NOTE   Patient Name: [name] MRN: [id number] DOB:08/25/1945, 76 y.o., female Today's Date: 11/02/2021  PCP: Orpah Melter, MD  REFERRING PROVIDER: Larena Glassman, MD   END OF SESSION:   PT End of Session - 11/02/21 1537     Visit Number 9    Number of Visits 21    Date for PT Re-Evaluation 12/15/21    Authorization Type [employer]    Progress Note Due on Visit 10    PT Start Time 1536   Pt using restroom in beginning of session   PT Stop Time 1615    PT Time Calculation (min) 39 min    Activity Tolerance Patient limited by pain    Behavior During Therapy WFL for tasks assessed/performed             Past Medical History:  Diagnosis Date   Actinic keratosis    Followed by Dr. Allyson Sabal   Anxiety    Diabetes mellitus, type II, insulin dependent ([place])    Associated with obesity: She takes Lantus, Victoza & Actos   Diplopia    Essential hypertension    GERD (gastroesophageal reflux disease)    Hyperlipidemia    Morbid obesity with BMI of 40.0-44.9, adult [facility])    Past Surgical History:  Procedure Laterality Date   BREAST BIOPSY  1997   LEFT HEART CATH AND CORONARY ANGIOGRAPHY N/A 01/05/2017   Procedure: LEFT HEART CATH AND CORONARY ANGIOGRAPHY;  Surgeon: Leonie Man, MD;  Location: [place] [facility];  Service: Cardiovascular: Angiographically normal coronary arteries with a right dominant system.  Normal EF 55-65%.   LOOP RECORDER INSERTION N/A 08/01/2019   Procedure: LOOP RECORDER INSERTION;  Surgeon: Constance Haw, MD;  Location: [place] [facility];  Service: Cardiovascular;  Laterality: N/A;   Patient Active Problem List   Diagnosis Date Noted   Paroxysmal atrial fibrillation ([place]) 08/08/2019   Secondary hypercoagulable state ([place]) 08/08/2019   Acute cerebrovascular accident of cerebellum ([place]) 07/31/2019   GERD (gastroesophageal reflux disease)    Leukocytosis    Morbid obesity ([place]) 01/25/2018    Chest pain with low risk for cardiac etiology 12/31/2016   Essential hypertension 12/31/2016   Hyperlipidemia 12/31/2016   DM (diabetes mellitus), type 2 with complications ([place]) 68/05/2120    REFERRING DIAG: Q82.500,B70.48 (ICD-10-CM) - Chronic pain of left knee    THERAPY DIAG:  Muscle weakness (generalized)  Unsteadiness on feet  Other abnormalities of gait and mobility  Rationale for Evaluation and Treatment Rehabilitation  PERTINENT HISTORY: DM II, GERD, hyperlipidemia, diplopia, obesity, R Colles fx   PRECAUTIONS: Fall, RUE coffee cup weightbearing   SUBJECTIVE: Patient reports doing well- no falls. Having more radiating pain down her LLE today. Additions to HEP are going well.   PAIN:  Are you having pain? Yes: NPRS scale: 8/10 Pain location: LLE Pain description: burning down posterior aspect of leg; nerve pain keeping her up at night     OBJECTIVE:    TODAY'S TREATMENT:  Ther Ex  -Seated sciatic nerve glides x20 for pain modulation  -SciFit level 2 for 8 minutes using BUE/BLEs for dynamic cardiovascular conditioning, global strengthening and pain modulation.   In // bars for improved single leg stability and BLE strength: -Lateral dot step overs w/BUE support on rail, down and back in bars x3.  -Standing on airex without UE support for improved ankle strategy, 3x45s. Progressed to [facility] for improved proprioceptive input and BLE strength, x90s.  -Standing on  airex, alt cone taps w/unilateral support on rail, x10 per side. Progressed to double cone taps for improved BLE strengthening, x8 per side.    [facility] PT Assessment - 11/02/21 1609       Ambulation/Gait   Gait velocity 32.8' over 16.29s = 2.01 ft/s = 0.36ms               PATIENT EDUCATION: Education details: continue HEP, improvement w/gait velocity  Person educated: Patient and Child(ren) Education method: Explanation Education comprehension: verbalized understanding and needs further education      HOME EXERCISE PROGRAM: Access Code: [email or web address]: [email or web address] Date: 10/29/2021 Prepared by: JEstevan Ryder Exercises - Seated knee extension   - 1 x daily - 7 x weekly - 3 sets - 10 reps - 5 second hold - Supine Bridge  - 1 x daily - 7 x weekly - 3 sets - 10 reps - Seated Knee Flexion Extension AROM   - 1 x daily - 7 x weekly - 3 sets - 10 reps - Standing March with Counter Support  - 1 x daily - 7 x weekly - 3 sets - 10 reps - Standing Hip Abduction with Counter Support  - 1 x daily - 7 x weekly - 3 sets - 10 reps - Standing Hip Extension with Counter Support  - 1 x daily - 7 x weekly - 3 sets - 10 reps - Side Stepping with Counter Support  - 1 x daily - 7 x weekly - 3 sets - 10 reps - Backward Walking with Counter Support  - 1 x daily - 7 x weekly - 3 sets - 10 reps - Heel Raises with Counter Support  - 1 x daily - 7 x weekly - 3 sets - 10 reps - Seated Gastroc Stretch with Strap  - 1 x daily - 7 x weekly - 3 sets - 10 reps   GOALS: Goals reviewed with patient? Yes   SHORT TERM GOALS: Target date: 11/03/2021   Pt will be independent with initial HEP for improved strength and balance  Baseline: provided  Goal status: MET   2.  Pt will improve gait speed to >/= .222m to demonstrate improved community ambulation   Baseline: .1520m .64m25m7/27/23); 0.61 m/s on 8/7  Goal status: MET   3.  Pt will improve FGA to >/= 10/30 to demonstrate improved balance and reduced fall risk   Baseline: 5/30; 11/30 (10/22/21)  Goal status: MET   4.  Patient will improve L knee AROM flexion to at least 90* Baseline: 72*;  117* (7/27)  Goal status: MET   5.  Patient will improve L knee AROM extension to -15* Baseline: -22*; -10* (7/27)  Goal status: MET   LONG TERM GOALS: Target date: 12/15/2021   Pt will be independent with final HEP for improved strength and balance  Baseline: to be provided Goal status: INITIAL   2.  Pt will improve FGA to >/= 20/30 to  demonstrate improved balance and reduced fall risk   Baseline: 5/30 Goal status: INITIAL   3.  Pt will improve gait speed to >/= .36m/63m demonstrate improved community ambulation   Baseline: .49m/s44ml status: INITIAL   4.  Patient will improve L knee AROM flexion to at least 120* Baseline: 72* Goal status: REVISED   5.  Patient will improve L knee AROM extension to better than -5* Baseline: -22* Goal status: REVISED       ASSESSMENT:   CLINICAL IMPRESSION: Session  limited due to pt's high pain levels. Emphasis of skilled PT session on BLE strength and endurance. Pt has significantly improved her gait speed from 0.72ms to 0.611m, surpassing her goal of 0.2527m Pt able to tolerate single leg stability and improved hip/knee flexion today compared to previous sessions. Continue POC.     OBJECTIVE IMPAIRMENTS Abnormal gait, cardiopulmonary status limiting activity, decreased activity tolerance, decreased balance, decreased coordination, decreased endurance, decreased knowledge of condition, decreased knowledge of use of DME, decreased mobility, difficulty walking, decreased ROM, decreased strength, hypomobility, increased edema, impaired sensation, and obesity.    ACTIVITY LIMITATIONS carrying, lifting, bending, sitting, standing, squatting, stairs, transfers, bed mobility, and locomotion level   PARTICIPATION LIMITATIONS: meal prep, cleaning, laundry, driving, shopping, community activity, and yard work   PERSONAL FACTORS Age, Behavior pattern, Fitness, Past/current experiences, and 3+ comorbidities: hyperlipidemia, DM II, obesity  are also affecting patient's functional outcome.    REHAB POTENTIAL: Good   CLINICAL DECISION MAKING: Stable/uncomplicated   EVALUATION COMPLEXITY: Low   PLAN: PT FREQUENCY: 2x/week   PT DURATION: 10 weeks   PLANNED INTERVENTIONS: Therapeutic exercises, Therapeutic activity, Neuromuscular re-education, Balance training, Gait training,  Patient/Family education, Joint mobilization, Stair training, Vestibular training, Visual/preceptual remediation/compensation, Orthotic/Fit training, DME instructions, Aquatic Therapy, Dry Needling, Electrical stimulation, Cryotherapy, Moist heat, Manual lymph drainage, Compression bandaging, Splintting, Taping, Manual therapy, and Re-evaluation   PLAN FOR NEXT SESSION: 10th visit PN, scifit for endurance and ROM, L weight shifting, ambulatory balance, standing balance/gait on compliant surfaces, confidence with mobility!    JanCruzita Ledereraster, PT, DPT 11/02/2021, 4:26 PM

## 2021-11-02 NOTE — Telephone Encounter (Signed)
Patient left message for Dr. Amedeo Kinsman nurse to call her so she can get her surgery schedule.   Please call her at [phone]

## 2021-11-04 NOTE — Patient Instructions (Signed)
[name]  11/04/2021     '@PREFPERIOPPHARMACY'$ @   Your procedure is scheduled on 11/12/2021.  Report to [facility] at 7:15 A.M.  Call this number if you have problems the morning of surgery:  [phone]   Remember:  Do not eat or drink after midnight.    Take these medicines the morning of surgery with A SIP OF WATER : Xanax, Carvedilol, Neurontin and Claritin    Do not wear jewelry, make-up or nail polish.  Do not wear lotions, powders, or perfumes, or deodorant.  Do not shave 48 hours prior to surgery.  Men may shave face and neck.  Do not bring valuables to the hospital.  [facility] is not responsible for any belongings or valuables.  Contacts, dentures or bridgework may not be worn into surgery.  Leave your suitcase in the car.  After surgery it may be brought to your room.  For patients admitted to the hospital, discharge time will be determined by your treatment team.  Patients discharged the day of surgery will not be allowed to drive home.   Name and phone number of your driver:   Family Special instructions:  N/A  Please read over the following fact sheets that you were given. Care and Recovery After Surgery  General Anesthesia, Adult General anesthesia is the use of medicine to make you fall asleep (unconscious) for a medical procedure. General anesthesia must be used for certain procedures. It is often recommended for surgery or procedures that: Last a long time. Require you to be still or in an unusual position. Are major and can cause blood loss. Affect your breathing. The medicines used for general anesthesia are called general anesthetics. During general anesthesia, these medicines are given along with medicines that: Prevent pain. Control your blood pressure. Relax your muscles. Prevent nausea and vomiting after the procedure. Tell a health care provider about: Any allergies you have. All medicines you are taking, including vitamins, herbs,  eye drops, creams, and over-the-counter medicines. Your history of any: Medical conditions you have, including: High blood pressure. Bleeding problems. Diabetes. Heart or lung conditions, such as: Heart failure. Sleep apnea. Asthma. Chronic obstructive pulmonary disease (COPD). Current or recent illnesses, such as: Upper respiratory, chest, or ear infections. Cough or fever. Tobacco or drug use, including marijuana or alcohol use. Depression or anxiety. Surgeries and types of anesthetics you have had. Problems you or family members have had with anesthetic medicines. Whether you are pregnant or may be pregnant. Whether you have any chipped or loose teeth, dentures, caps, bridgework, or issues with your mouth, swallowing, or choking. What are the risks? Your health care provider will talk with you about risks. These may include: Allergic reaction to the medicines. Lung and heart problems. Inhaling food or liquid from the stomach into the lungs (aspiration). Nerve injury. Injury to the lips, mouth, teeth, or gums. Stroke. Waking up during your procedure and being unable to move. This is rare. These problems are more likely to develop if you are having a major surgery or if you have an advanced or serious medical condition. You can prevent some of these complications by answering all of your health care provider's questions thoroughly and by following all instructions before your procedure. General anesthesia can cause side effects, including: Nausea or vomiting. A sore throat or hoarseness from the breathing tube. Wheezing or coughing. Shaking chills or feeling cold. Body aches. Sleepiness. Confusion, agitation (delirium), or anxiety. What happens before the procedure? When to stop  eating and drinking Follow instructions from your health care provider about what you may eat and drink before your procedure. If you do not follow your health care provider's instructions, your  procedure may be delayed or canceled. Medicines Ask your health care provider about: Changing or stopping your regular medicines. These include any diabetes medicines or blood thinners you take. Taking medicines such as aspirin and ibuprofen. These medicines can thin your blood. Do not take them unless your health care provider tells you to. Taking over-the-counter medicines, vitamins, herbs, and supplements. General instructions Do not use any products that contain nicotine or tobacco for at least 4 weeks before the procedure. These products include cigarettes, chewing tobacco, and vaping devices, such as e-cigarettes. If you need help quitting, ask your health care provider. If you brush your teeth on the morning of the procedure, make sure to spit out all of the water and toothpaste. If told by your health care provider, bring your sleep apnea device with you to surgery (if applicable). If you will be going home right after the procedure, plan to have a responsible adult: Take you home from the hospital or clinic. You will not be allowed to drive. Care for you for the time you are told. What happens during the procedure?  An IV will be inserted into one of your veins. You will be given one or more of the following through a face mask or IV: A sedative. This helps you relax. Anesthesia. This will: Numb certain areas of your body. Make you fall asleep for surgery. After you are unconscious, a breathing tube may be inserted down your throat to help you breathe. This will be removed before you wake up. An anesthesia provider, such as an anesthesiologist, will stay with you throughout your procedure. The anesthesia provider will: Keep you comfortable and safe by continuing to give you medicines and adjusting the amount of medicine that you get. Monitor your blood pressure, heart rate, and oxygen levels to make sure that the anesthetics do not cause any problems. The procedure may vary among  health care providers and hospitals. What happens after the procedure? Your blood pressure, temperature, heart rate, breathing rate, and blood oxygen level will be monitored until you leave the hospital or clinic. You will wake up in a recovery area. You may wake up slowly. You may be given medicine to help you with pain, nausea, or any other side effects from the anesthesia. Summary General anesthesia is the use of medicine to make you fall asleep (unconscious) for a medical procedure. Follow your health care provider's instructions about when to stop eating, drinking, or taking certain medicines before your procedure. Plan to have a responsible adult take you home from the hospital or clinic. This information is not intended to replace advice given to you by your health care provider. Make sure you discuss any questions you have with your health care provider. Document Revised: 06/11/2021 Document Reviewed: 06/11/2021 Elsevier Patient Education  [place].  Open Carpal Tunnel Release Open carpal tunnel release is a surgery to relieve symptoms caused by carpal tunnel syndrome. The carpal tunnel is a narrow, rigid space in the wrist. It is located between the wrist bones and a band of connective tissue (transverse carpal ligament). The nerve that supplies most of the hand (median nerve) passes through the carpal tunnel, and so do tissues that connect bones to muscles (tendons) in the hand and arm. Carpal tunnel syndrome makes this space swell and become  narrow. The swelling pinches the median nerve and causes pain, numbness, tingling, and weakness in the hand. During carpal tunnel release surgery, the transverse carpal ligament is cut to make more room in the carpal tunnel space. This also lessens the pressure on the median nerve. You may have this surgery if other types of treatment have not relieved your carpal tunnel symptoms. This surgery is usually done only for the hand that you use  more often (dominant hand), but it may be done for both hands depending on your symptoms. Tell a health care provider about: Any allergies you have. All medicines you are taking, including vitamins, herbs, eye drops, creams, and over-the-counter medicines. Any problems you or family members have had with anesthetic medicines. Any blood disorders you have. Any surgeries you have had. Any medical conditions you have. Whether you are pregnant or may be pregnant. What are the risks? Generally, this is a safe procedure. However, problems may occur, including: Infection. Bleeding. Injury to the median nerve. Allergic reactions to medicines. The surgery failing to relieve your symptoms, or making your symptoms worse. What happens before the procedure? Staying hydrated Follow instructions from your health care provider about hydration, which may include: Up to 2 hours before the procedure - you may continue to drink clear liquids, such as water, clear fruit juice, black coffee, and plain tea.  Eating and drinking restrictions Follow instructions from your health care provider about eating and drinking, which may include: 8 hours before the procedure - stop eating heavy meals or foods such as meat, fried foods, or fatty foods. 6 hours before the procedure - stop eating light meals or foods, such as toast or cereal. 6 hours before the procedure - stop drinking milk or drinks that contain milk. 2 hours before the procedure - stop drinking clear liquids. Medicines Ask your health care provider about: Changing or stopping your regular medicines. This is especially important if you are taking diabetes medicines or blood thinners. Taking medicines such as aspirin and ibuprofen. These medicines can thin your blood. Do not take these medicines unless your health care provider tells you to take them. Taking over-the-counter medicines, vitamins, herbs, and supplements. General instructions Do not use  any products that contain nicotine or tobacco for at least 4 weeks before the procedure. These products include cigarettes, e-cigarettes, and chewing tobacco. If you need help quitting, ask your health care provider. Plan to have a responsible adult take you home from the hospital or clinic. If you will be going home right after the procedure, plan to have a responsible adult care for you for the time you are told. This is important. Ask your health care provider: How your surgery site will be marked. What steps will be taken to help prevent infection. These steps may include washing the skin with a germ-killing soap. What happens during the procedure?  An IV will be inserted into one of your veins. You will be given one or more of the following: A medicine to help you relax (sedative). A medicine to numb the wrist area (local anesthetic). A medicine that is injected to numb everything below the injection site (regional anesthetic). An incision will be made in your wrist, on the same side as your palm. The skin of your wrist will be spread to expose the transverse carpal ligament. The transverse carpal ligament will be cut to make more room in the carpal tunnel space. Your incision will be closed with stitches (sutures) or staples. A compression  bandage (dressing) will be wrapped around your hand and wrist. The procedure may vary among health care providers and hospitals. What happens after the procedure? Your blood pressure, heart rate, breathing rate, and blood oxygen level will be monitored until you leave the hospital or clinic. You will be given pain medicine as needed. A splint or brace may be placed over your dressing, to hold your hand and wrist in place while you heal. Do not drive until your health care provider approves. Summary Carpal tunnel release is a surgery to relieve pain and numbness in the hand caused by swelling around a nerve (carpal tunnel syndrome). You may have this  surgery if other types of treatment have not relieved your carpal tunnel symptoms. During carpal tunnel release surgery, a band of connective tissue (transverse carpal ligament) is cut to make more room in the carpal tunnel space. This information is not intended to replace advice given to you by your health care provider. Make sure you discuss any questions you have with your health care provider. Document Revised: 07/31/2019 Document Reviewed: 07/19/2019 Elsevier Patient Education  [place].

## 2021-11-05 ENCOUNTER — Telehealth: Payer: Self-pay | Admitting: *Deleted

## 2021-11-05 ENCOUNTER — Telehealth: Payer: Self-pay | Admitting: Cardiology

## 2021-11-05 ENCOUNTER — Ambulatory Visit: Payer: HMO | Admitting: Physical Therapy

## 2021-11-05 DIAGNOSIS — R2689 Other abnormalities of gait and mobility: Secondary | ICD-10-CM

## 2021-11-05 DIAGNOSIS — M6281 Muscle weakness (generalized): Secondary | ICD-10-CM

## 2021-11-05 DIAGNOSIS — R2681 Unsteadiness on feet: Secondary | ICD-10-CM

## 2021-11-05 NOTE — Telephone Encounter (Signed)
Covering preop today. Will route to pharm then pt will need priortized VV given short notice.

## 2021-11-05 NOTE — Telephone Encounter (Addendum)
   Name: [name]  DOB: 1945/05/30  MRN: [id number]  Primary Cardiologist: Glenetta Hew, MD   Preoperative team, please contact this patient and set up a phone call appointment for further preoperative risk assessment. Please obtain consent and complete medication review. Thank you for your help. Since only needing to hold for 1 day prior to procedure and virtual schedules maxed, latest would be Tuesday's schedule but would not push out any later than that - please preliminary relay to patient not to begin holding blood thinner until we give permission.  I confirm that guidance regarding antiplatelet and oral anticoagulation therapy has been completed and, if necessary, noted below.   Charlie Pitter, PA-C 11/05/2021, 2:00 PM [place]

## 2021-11-05 NOTE — Telephone Encounter (Signed)
Tele appt 11/10/21 @ 9:20. Med rec and consent are done.

## 2021-11-05 NOTE — Telephone Encounter (Signed)
   Pre-operative Risk Assessment    Patient Name: [name]  DOB: 05/12/1945 MRN: [id number]      Request for Surgical Clearance    Procedure:   Corpal Tunnnel Release Date of Surgery:  Clearance 11-12-21                                 Surgeon:  Dr  Larena Glassman Surgeon's Group or Practice Name:   Phone number:  [phone] Fax number:  [phone]   Type of Clearance Requested:   - Pharmacy:  Hold Apixaban (Eliquis)     Type of Anesthesia:   Choice   Additional requests/questions:    [name]   11/05/2021, 10:34 AM

## 2021-11-05 NOTE — Telephone Encounter (Signed)
Patient with diagnosis of afib on Eliquis for anticoagulation.    Procedure: carpal tunnel release Date of procedure: 11/12/21  CHA2DS2-VASc Score = 7  This indicates a 11.2% annual risk of stroke. The patient's score is based upon: CHF History: 0 HTN History: 1 Diabetes History: 1 Stroke History: 2 Vascular Disease History: 0 Age Score: 2 Gender Score: 1  CrCl 57m/min using adjusted body weight due to obesity Platelet count 201K  Per office protocol, patient can hold Eliquis for 1 day prior to procedure. Resume as soon as safely possible after given elevated CV risk.  **This guidance is not considered finalized until pre-operative APP has relayed final recommendations.**

## 2021-11-05 NOTE — Therapy (Signed)
OUTPATIENT PHYSICAL THERAPY TREATMENT NOTE-10TH VISIT PROGRESS NOTE   Patient Name: [name] MRN: [id number] DOB:January 02, 1946, 76 y.o., female Today's Date: 11/05/2021  PCP: Orpah Melter, MD  REFERRING PROVIDER: Larena Glassman, MD   Physical Therapy Progress Note   Dates of Reporting Period:09/22/21 - 11/05/21  See Note below for Objective Data and Assessment of Progress/Goals.  Thank you for the referral of this patient. Mickie Bail , PT, DPT   END OF SESSION:   PT End of Session - 11/05/21 1535     Visit Number 10    Number of Visits 21    Date for PT Re-Evaluation 12/15/21    Authorization Type [employer]    Progress Note Due on Visit 10    PT Start Time 1532    PT Stop Time 1617    PT Time Calculation (min) 45 min    Activity Tolerance Patient tolerated treatment well    Behavior During Therapy WFL for tasks assessed/performed              Past Medical History:  Diagnosis Date   Actinic keratosis    Followed by Dr. Allyson Sabal   Anxiety    Diabetes mellitus, type II, insulin dependent ([place])    Associated with obesity: She takes Lantus, Victoza & Actos   Diplopia    Essential hypertension    GERD (gastroesophageal reflux disease)    Hyperlipidemia    Morbid obesity with BMI of 40.0-44.9, adult [facility])    Past Surgical History:  Procedure Laterality Date   BREAST BIOPSY  1997   LEFT HEART CATH AND CORONARY ANGIOGRAPHY N/A 01/05/2017   Procedure: LEFT HEART CATH AND CORONARY ANGIOGRAPHY;  Surgeon: Leonie Man, MD;  Location: [facility];  Service: Cardiovascular: Angiographically normal coronary arteries with a right dominant system.  Normal EF 55-65%.   LOOP RECORDER INSERTION N/A 08/01/2019   Procedure: LOOP RECORDER INSERTION;  Surgeon: Constance Haw, MD;  Location: [facility] [facility];  Service: Cardiovascular;  Laterality: N/A;   Patient Active Problem List   Diagnosis Date Noted   Paroxysmal atrial fibrillation ([place])  08/08/2019   Secondary hypercoagulable state ([place]) 08/08/2019   Acute cerebrovascular accident of cerebellum ([place]) 07/31/2019   GERD (gastroesophageal reflux disease)    Leukocytosis    Morbid obesity ([place]) 01/25/2018   Chest pain with low risk for cardiac etiology 12/31/2016   Essential hypertension 12/31/2016   Hyperlipidemia 12/31/2016   DM (diabetes mellitus), type 2 with complications ([place]) 54/56/2563    REFERRING DIAG: S93.734,K87.68 (ICD-10-CM) - Chronic pain of left knee    THERAPY DIAG:  Muscle weakness (generalized)  Unsteadiness on feet  Other abnormalities of gait and mobility  Rationale for Evaluation and Treatment Rehabilitation  PERTINENT HISTORY: DM II, GERD, hyperlipidemia, diplopia, obesity, R Colles fx   PRECAUTIONS: Fall, RUE coffee cup weightbearing   SUBJECTIVE: Pt reports she did not sleep well last night but is doing well today, having less pain than last visit and is "covered in dog hair".  PAIN:  Are you having pain? Yes: NPRS scale: 3/10 Pain location: LLE Pain description: burning down posterior aspect of leg; nerve pain keeping her up at night     OBJECTIVE:    TODAY'S TREATMENT:  Gait training  Gait pattern: step through pattern, decreased arm swing- Right, decreased arm swing- Left, decreased step length- Right, decreased step length- Left, decreased hip/knee flexion- Right, decreased hip/knee flexion- Left, decreased ankle dorsiflexion- Right, decreased ankle dorsiflexion- Left, lateral lean- Right,  and lateral lean- Left Distance walked: Various clinic distances  Assistive device utilized: None Level of assistance: SBA Comments: Practiced ambulating short distances in clinic without AD to boost pt's confidence and prepare pt for potential inability to use RW following carpal tunnel surgery next week. Pt initially very guarded and anxious, mod cues for relaxed shoulders and initiate arm swing and pt ambulated back and forth in // bars x10  without UE support and no rest break and slowly able to demonstrate min arm swing bilaterally and increased cadence. Progressed to walking laps around // bars to reduce comfort and allow more space for arm swing, x8 laps. Distant S* throughout   NMR  In // bars for improved lateral reaching, dynamic balance and hip/ankle strategy: -Cone grab and cross body stack, x8 cones per side without UE support. No LOB throughout  -Progressed to cone grab and cross-body stack, x8 cones per side standing on airex without UE support. Min cues to slow down w/rotation as pt initially losing balance anteriorly due to moving too quickly. Once pt reduced speed of turn, no LOB noted.   Ther Ex  SciFit multi-peaks level 4 for 8 minutes using BUE/BLEs for BLE strength, dynamic cardiovascular conditioning and increased amplitude of stepping. RPE of 5/10 following activity      PATIENT EDUCATION: Education details: continue HEP, starting to walk inside home without AD for improved confidence w/gait and BLE strength  Person educated: Patient and Child(ren) Education method: Explanation Education comprehension: verbalized understanding and needs further education     HOME EXERCISE PROGRAM: Access Code: [email or web address] URL: [email or web address] Date: 10/29/2021 Prepared by: Estevan Ryder  Exercises - Seated knee extension   - 1 x daily - 7 x weekly - 3 sets - 10 reps - 5 second hold - Supine Bridge  - 1 x daily - 7 x weekly - 3 sets - 10 reps - Seated Knee Flexion Extension AROM   - 1 x daily - 7 x weekly - 3 sets - 10 reps - Standing March with Counter Support  - 1 x daily - 7 x weekly - 3 sets - 10 reps - Standing Hip Abduction with Counter Support  - 1 x daily - 7 x weekly - 3 sets - 10 reps - Standing Hip Extension with Counter Support  - 1 x daily - 7 x weekly - 3 sets - 10 reps - Side Stepping with Counter Support  - 1 x daily - 7 x weekly - 3 sets - 10 reps - Backward Walking with Counter  Support  - 1 x daily - 7 x weekly - 3 sets - 10 reps - Heel Raises with Counter Support  - 1 x daily - 7 x weekly - 3 sets - 10 reps - Seated Gastroc Stretch with Strap  - 1 x daily - 7 x weekly - 3 sets - 10 reps   GOALS: Goals reviewed with patient? Yes   SHORT TERM GOALS: Target date: 11/03/2021   Pt will be independent with initial HEP for improved strength and balance  Baseline: provided  Goal status: MET   2.  Pt will improve gait speed to >/= .56ms to demonstrate improved community ambulation   Baseline: .180m; .2412m(10/22/21); 0.61 m/s on 8/7  Goal status: MET   3.  Pt will improve FGA to >/= 10/30 to demonstrate improved balance and reduced fall risk   Baseline: 5/30; 11/30 (10/22/21)  Goal status: MET   4.  Patient will improve L  knee AROM flexion to at least 90* Baseline: 72*;  117* (7/27)  Goal status: MET   5.  Patient will improve L knee AROM extension to -15* Baseline: -22*; -10* (7/27)  Goal status: MET   LONG TERM GOALS: Target date: 12/15/2021   Pt will be independent with final HEP for improved strength and balance  Baseline: to be provided Goal status: INITIAL   2.  Pt will improve FGA to >/= 20/30 to demonstrate improved balance and reduced fall risk   Baseline: 5/30 Goal status: INITIAL   3.  Pt will improve gait speed to >/= .65ms to demonstrate improved community ambulation   Baseline: .160m Goal status: INITIAL   4.  Patient will improve L knee AROM flexion to at least 120* Baseline: 72* Goal status: REVISED   5.  Patient will improve L knee AROM extension to better than -5* Baseline: -22* Goal status: REVISED       ASSESSMENT:   CLINICAL IMPRESSION: Emphasis of skilled PT session on reactive balance strategies, gait training without AD and BLE strength. Pt initially very guarded while walking without AD but w/added practice, was able to begin to initiate arm swing and narrow BOS. Encouraged pt to practice walking without AD at  home for short distances to help boost her confidence. Continue POC.     OBJECTIVE IMPAIRMENTS Abnormal gait, cardiopulmonary status limiting activity, decreased activity tolerance, decreased balance, decreased coordination, decreased endurance, decreased knowledge of condition, decreased knowledge of use of DME, decreased mobility, difficulty walking, decreased ROM, decreased strength, hypomobility, increased edema, impaired sensation, and obesity.    ACTIVITY LIMITATIONS carrying, lifting, bending, sitting, standing, squatting, stairs, transfers, bed mobility, and locomotion level   PARTICIPATION LIMITATIONS: meal prep, cleaning, laundry, driving, shopping, community activity, and yard work   PERSONAL FACTORS Age, Behavior pattern, Fitness, Past/current experiences, and 3+ comorbidities: hyperlipidemia, DM II, obesity  are also affecting patient's functional outcome.    REHAB POTENTIAL: Good   CLINICAL DECISION MAKING: Stable/uncomplicated   EVALUATION COMPLEXITY: Low   PLAN: PT FREQUENCY: 2x/week   PT DURATION: 10 weeks   PLANNED INTERVENTIONS: Therapeutic exercises, Therapeutic activity, Neuromuscular re-education, Balance training, Gait training, Patient/Family education, Joint mobilization, Stair training, Vestibular training, Visual/preceptual remediation/compensation, Orthotic/Fit training, DME instructions, Aquatic Therapy, Dry Needling, Electrical stimulation, Cryotherapy, Moist heat, Manual lymph drainage, Compression bandaging, Splintting, Taping, Manual therapy, and Re-evaluation   PLAN FOR NEXT SESSION: scifit for endurance and ROM, L weight shifting, ambulatory balance, standing balance/gait on compliant surfaces, confidence with mobility!    JaCruzita Ledererlaster, PT, DPT 11/05/2021, 4:18 PM

## 2021-11-05 NOTE — Telephone Encounter (Signed)
Called pt to verify she is scheduled for surgery. Pt state she is scheduled for preop 8/11, and surgery 8/17. No clear instructions were given about her blood thinner, call made to [facility] and message left asking how pt should take Eliquis for procedure.Awaiting a return call for directions from Dr. Ellyn Hack.

## 2021-11-05 NOTE — Telephone Encounter (Signed)
Tele appt 11/10/21 @ 9:20. Med rec and consent are done.     Patient Consent for Virtual Visit        [name] has provided verbal consent on 11/05/2021 for a virtual visit (video or telephone).   CONSENT FOR VIRTUAL VISIT FOR:  [name]  By participating in this virtual visit I agree to the following:  I hereby voluntarily request, consent and authorize [facility] and its employed or contracted physicians, physician assistants, nurse practitioners or other licensed health care professionals (the Practitioner), to provide me with telemedicine health care services (the "Services") as deemed necessary by the treating Practitioner. I acknowledge and consent to receive the Services by the Practitioner via telemedicine. I understand that the telemedicine visit will involve communicating with the Practitioner through live audiovisual communication technology and the disclosure of certain medical information by electronic transmission. I acknowledge that I have been given the opportunity to request an in-person assessment or other available alternative prior to the telemedicine visit and am voluntarily participating in the telemedicine visit.  I understand that I have the right to withhold or withdraw my consent to the use of telemedicine in the course of my care at any time, without affecting my right to future care or treatment, and that the Practitioner or I may terminate the telemedicine visit at any time. I understand that I have the right to inspect all information obtained and/or recorded in the course of the telemedicine visit and may receive copies of available information for a reasonable fee.  I understand that some of the potential risks of receiving the Services via telemedicine include:  Delay or interruption in medical evaluation due to technological equipment failure or disruption; Information transmitted may not be sufficient (e.g. poor resolution of images) to allow  for appropriate medical decision making by the Practitioner; and/or  In rare instances, security protocols could fail, causing a breach of personal health information.  Furthermore, I acknowledge that it is my responsibility to provide information about my medical history, conditions and care that is complete and accurate to the best of my ability. I acknowledge that Practitioner's advice, recommendations, and/or decision may be based on factors not within their control, such as incomplete or inaccurate data provided by me or distortions of diagnostic images or specimens that may result from electronic transmissions. I understand that the practice of medicine is not an exact science and that Practitioner makes no warranties or guarantees regarding treatment outcomes. I acknowledge that a copy of this consent can be made available to me via my patient portal ([facility]), or I can request a printed copy by calling the office of [facility].    I understand that my insurance will be billed for this visit.   I have read or had this consent read to me. I understand the contents of this consent, which adequately explains the benefits and risks of the Services being provided via telemedicine.  I have been provided ample opportunity to ask questions regarding this consent and the Services and have had my questions answered to my satisfaction. I give my informed consent for the services to be provided through the use of telemedicine in my medical care

## 2021-11-06 ENCOUNTER — Encounter (HOSPITAL_COMMUNITY): Payer: Self-pay

## 2021-11-06 ENCOUNTER — Encounter (HOSPITAL_COMMUNITY)
Admission: RE | Admit: 2021-11-06 | Discharge: 2021-11-06 | Disposition: A | Discharge status: Home / Self Care | Payer: HMO | Source: Ambulatory Visit | Attending: Orthopedic Surgery | Admitting: Orthopedic Surgery

## 2021-11-06 DIAGNOSIS — Z01818 Encounter for other preprocedural examination: Secondary | ICD-10-CM

## 2021-11-06 DIAGNOSIS — G5601 Carpal tunnel syndrome, right upper limb: Secondary | ICD-10-CM | POA: Insufficient documentation

## 2021-11-06 DIAGNOSIS — Z01812 Encounter for preprocedural laboratory examination: Secondary | ICD-10-CM | POA: Insufficient documentation

## 2021-11-06 LAB — CBC WITH DIFFERENTIAL/PLATELET
Abs Immature Granulocytes: 0.02 10*3/uL (ref 0.00–0.07)
Basophils Absolute: 0 10*3/uL (ref 0.0–0.1)
Basophils Relative: 0 %
Eosinophils Absolute: 0.1 10*3/uL (ref 0.0–0.5)
Eosinophils Relative: 2 %
HCT: 30.8 % — ABNORMAL LOW (ref 36.0–46.0)
Hemoglobin: 9.6 g/dL — ABNORMAL LOW (ref 12.0–15.0)
Immature Granulocytes: 0 %
Lymphocytes Relative: 21 %
Lymphs Abs: 1.2 10*3/uL (ref 0.7–4.0)
MCH: 30 pg (ref 26.0–34.0)
MCHC: 31.2 g/dL (ref 30.0–36.0)
MCV: 96.3 fL (ref 80.0–100.0)
Monocytes Absolute: 0.8 10*3/uL (ref 0.1–1.0)
Monocytes Relative: 13 %
Neutro Abs: 3.9 10*3/uL (ref 1.7–7.7)
Neutrophils Relative %: 64 %
Platelets: 174 10*3/uL (ref 150–400)
RBC: 3.2 MIL/uL — ABNORMAL LOW (ref 3.87–5.11)
RDW: 16.2 % — ABNORMAL HIGH (ref 11.5–15.5)
WBC: 6.1 10*3/uL (ref 4.0–10.5)
nRBC: 0 % (ref 0.0–0.2)

## 2021-11-06 LAB — BASIC METABOLIC PANEL
Anion gap: 8 (ref 5–15)
BUN: 28 mg/dL — ABNORMAL HIGH (ref 8–23)
CO2: 26 mmol/L (ref 22–32)
Calcium: 9.3 mg/dL (ref 8.9–10.3)
Chloride: 104 mmol/L (ref 98–111)
Creatinine, Ser: 0.91 mg/dL (ref 0.44–1.00)
GFR, Estimated: >60 mL/min (ref >60)
Glucose, Bld: 94 mg/dL (ref 70–99)
Potassium: 3.9 mmol/L (ref 3.5–5.1)
Sodium: 138 mmol/L (ref 135–145)

## 2021-11-09 ENCOUNTER — Ambulatory Visit: Payer: HMO | Admitting: Physical Therapy

## 2021-11-09 DIAGNOSIS — M6281 Muscle weakness (generalized): Secondary | ICD-10-CM

## 2021-11-09 DIAGNOSIS — R2689 Other abnormalities of gait and mobility: Secondary | ICD-10-CM | POA: Diagnosis not present

## 2021-11-09 DIAGNOSIS — R2681 Unsteadiness on feet: Secondary | ICD-10-CM

## 2021-11-09 NOTE — Therapy (Signed)
OUTPATIENT PHYSICAL THERAPY TREATMENT NOTE   Patient Name: [name] MRN: [id number] DOB:[birth year], 76 y.o., female Today's Date: 11/09/2021  PCP: Orpah Melter, MD  REFERRING PROVIDER: Larena Glassman, MD    END OF SESSION:   PT End of Session - 11/09/21 1536     Visit Number 11    Number of Visits 21    Date for PT Re-Evaluation 12/15/21    Authorization Type [employer]    Progress Note Due on Visit 10    PT Start Time 1534   Pt arrived late   PT Stop Time 1612    PT Time Calculation (min) 38 min    Activity Tolerance Patient tolerated treatment well    Behavior During Therapy WFL for tasks assessed/performed              Past Medical History:  Diagnosis Date   Actinic keratosis    Followed by Dr. Allyson Sabal   Anxiety    Diabetes mellitus, type II, insulin dependent ([place])    Associated with obesity: She takes Lantus, Victoza & Actos   Diplopia    Essential hypertension    GERD (gastroesophageal reflux disease)    Hyperlipidemia    Morbid obesity with BMI of 40.0-44.9, adult ([place])    Stroke ([place]) 2021   Past Surgical History:  Procedure Laterality Date   BREAST BIOPSY  1997   LEFT HEART CATH AND CORONARY ANGIOGRAPHY N/A 01/05/2017   Procedure: LEFT HEART CATH AND CORONARY ANGIOGRAPHY;  Surgeon: Leonie Man, MD;  Location: [facility];  Service: Cardiovascular: Angiographically normal coronary arteries with a right dominant system.  Normal EF 55-65%.   LOOP RECORDER INSERTION N/A 08/01/2019   Procedure: LOOP RECORDER INSERTION;  Surgeon: Constance Haw, MD;  Location: [facility];  Service: Cardiovascular;  Laterality: N/A;   Patient Active Problem List   Diagnosis Date Noted   Paroxysmal atrial fibrillation ([place]) 08/08/2019   Secondary hypercoagulable state ([place]) 08/08/2019   Acute cerebrovascular accident of cerebellum ([place]) 07/31/2019   GERD (gastroesophageal reflux disease)    Leukocytosis    Morbid obesity ([place])  01/25/2018   Chest pain with low risk for cardiac etiology 12/31/2016   Essential hypertension 12/31/2016   Hyperlipidemia 12/31/2016   DM (diabetes mellitus), type 2 with complications ([place]) 46/80/3212    REFERRING DIAG: Y48.250,I37.04 (ICD-10-CM) - Chronic pain of left knee    THERAPY DIAG:  Muscle weakness (generalized)  Unsteadiness on feet  Rationale for Evaluation and Treatment Rehabilitation  PERTINENT HISTORY: DM II, GERD, hyperlipidemia, diplopia, obesity, R Colles fx   PRECAUTIONS: Fall, RUE coffee cup weightbearing   SUBJECTIVE: Pt reports she did not sleep well last night due to having a fraudulent call last night, "I have just been pooping all day". No new changes, has been walking without her walker at home and is going well.   PAIN:  Are you having pain? No    OBJECTIVE:    TODAY'S TREATMENT:  Self-care/home management  POC moving forward. Pt and son requesting to make OT/PT appointments on the same day if possible, so majority of session spent rearranging pt's appointments to make it more convenient as pt coming 4 days/week in the upcoming weeks to see 1 discipline/day. Pt and son provided updated calendar at end of session.  Lengthy discussion regarding importance of practicing walking at home on various surfaces without AD to boost pt's confidence and improve functional strength. Pt verbalized understanding and reported she would start walking down  her paved driveway and back for exercise.   Gait Training  Gait pattern: step through pattern, decreased arm swing- Right, decreased arm swing- Left, lateral lean- Right, lateral lean- Left, and wide BOS Distance walked: Back and forth in gravel pit Assistive device utilized: None Level of assistance: SBA Comments: Practiced ambulating in gravel pit to boost pt's confidence to walk outside to her driveway and back at home. Min cues to relax shoulders and increased arm swing for improved counterbalance. No LOB noted  throughout   Gait pattern: step through pattern, decreased arm swing- Right, decreased arm swing- Left, and wide BOS Distance walked: Various clinic distances and out of clinic.  Assistive device utilized: None Level of assistance: Modified independence Comments: Challenged pt to leave session without using RW and pt demonstrated ability to ambulate from [employer] to front desk without AD (son walking beside her)   PATIENT EDUCATION: Education details: continue HEP, see self-care section  Person educated: Patient and Child(ren) Education method: Explanation Education comprehension: verbalized understanding and needs further education     HOME EXERCISE PROGRAM: Access Code: [email or web address] URL: [email or web address] Date: 10/29/2021 Prepared by: Estevan Ryder  Exercises - Seated knee extension   - 1 x daily - 7 x weekly - 3 sets - 10 reps - 5 second hold - Supine Bridge  - 1 x daily - 7 x weekly - 3 sets - 10 reps - Seated Knee Flexion Extension AROM   - 1 x daily - 7 x weekly - 3 sets - 10 reps - Standing March with Counter Support  - 1 x daily - 7 x weekly - 3 sets - 10 reps - Standing Hip Abduction with Counter Support  - 1 x daily - 7 x weekly - 3 sets - 10 reps - Standing Hip Extension with Counter Support  - 1 x daily - 7 x weekly - 3 sets - 10 reps - Side Stepping with Counter Support  - 1 x daily - 7 x weekly - 3 sets - 10 reps - Backward Walking with Counter Support  - 1 x daily - 7 x weekly - 3 sets - 10 reps - Heel Raises with Counter Support  - 1 x daily - 7 x weekly - 3 sets - 10 reps - Seated Gastroc Stretch with Strap  - 1 x daily - 7 x weekly - 3 sets - 10 reps   GOALS: Goals reviewed with patient? Yes   SHORT TERM GOALS: Target date: 11/03/2021   Pt will be independent with initial HEP for improved strength and balance  Baseline: provided  Goal status: MET   2.  Pt will improve gait speed to >/= .40ms to demonstrate improved community ambulation    Baseline: .143m; .2424m(10/22/21); 0.61 m/s on 8/7  Goal status: MET   3.  Pt will improve FGA to >/= 10/30 to demonstrate improved balance and reduced fall risk   Baseline: 5/30; 11/30 (10/22/21)  Goal status: MET   4.  Patient will improve L knee AROM flexion to at least 90* Baseline: 72*;  117* (7/27)  Goal status: MET   5.  Patient will improve L knee AROM extension to -15* Baseline: -22*; -10* (7/27)  Goal status: MET   LONG TERM GOALS: Target date: 12/15/2021   Pt will be independent with final HEP for improved strength and balance  Baseline: to be provided Goal status: INITIAL   2.  Pt will improve FGA to >/= 20/30 to demonstrate improved balance and  reduced fall risk   Baseline: 5/30 Goal status: INITIAL   3.  Pt will improve gait speed to >/= .83ms to demonstrate improved community ambulation   Baseline: .131m Goal status: INITIAL   4.  Patient will improve L knee AROM flexion to at least 120* Baseline: 72* Goal status: REVISED   5.  Patient will improve L knee AROM extension to better than -5* Baseline: -22* Goal status: REVISED       ASSESSMENT:   CLINICAL IMPRESSION: Emphasis of skilled PT session on pt education and gait training on unlevel surfaces. Pt continues to demonstrate fear-avoidance behavior when walking without AD despite no balance impairment. Discussed importance of practicing walking without AD in home environment to boost confidence and for improved functional strength. Continue POC.     OBJECTIVE IMPAIRMENTS Abnormal gait, cardiopulmonary status limiting activity, decreased activity tolerance, decreased balance, decreased coordination, decreased endurance, decreased knowledge of condition, decreased knowledge of use of DME, decreased mobility, difficulty walking, decreased ROM, decreased strength, hypomobility, increased edema, impaired sensation, and obesity.    ACTIVITY LIMITATIONS carrying, lifting, bending, sitting, standing,  squatting, stairs, transfers, bed mobility, and locomotion level   PARTICIPATION LIMITATIONS: meal prep, cleaning, laundry, driving, shopping, community activity, and yard work   PERSONAL FACTORS Age, Behavior pattern, Fitness, Past/current experiences, and 3+ comorbidities: hyperlipidemia, DM II, obesity  are also affecting patient's functional outcome.    REHAB POTENTIAL: Good   CLINICAL DECISION MAKING: Stable/uncomplicated   EVALUATION COMPLEXITY: Low   PLAN: PT FREQUENCY: 2x/week   PT DURATION: 10 weeks   PLANNED INTERVENTIONS: Therapeutic exercises, Therapeutic activity, Neuromuscular re-education, Balance training, Gait training, Patient/Family education, Joint mobilization, Stair training, Vestibular training, Visual/preceptual remediation/compensation, Orthotic/Fit training, DME instructions, Aquatic Therapy, Dry Needling, Electrical stimulation, Cryotherapy, Moist heat, Manual lymph drainage, Compression bandaging, Splintting, Taping, Manual therapy, and Re-evaluation   PLAN FOR NEXT SESSION: scifit for endurance and ROM, L weight shifting, ambulatory balance, standing balance/gait on compliant surfaces, fun mat, confidence with mobility!    JaCruzita Ledererlaster, PT, DPT 11/09/2021, 4:14 PM

## 2021-11-10 ENCOUNTER — Ambulatory Visit (INDEPENDENT_AMBULATORY_CARE_PROVIDER_SITE_OTHER): Payer: HMO | Admitting: Nurse Practitioner

## 2021-11-10 DIAGNOSIS — Z01810 Encounter for preprocedural cardiovascular examination: Secondary | ICD-10-CM | POA: Diagnosis not present

## 2021-11-10 NOTE — Progress Notes (Signed)
Virtual Visit via Telephone Note   Because of [name]'s co-morbid illnesses, she is at least at moderate risk for complications without adequate follow up.  This format is felt to be most appropriate for this patient at this time.  The patient did not have access to video technology/had technical difficulties with video requiring transitioning to audio format only (telephone).  All issues noted in this document were discussed and addressed.  No physical exam could be performed with this format.  Please refer to the patient's chart for her consent to telehealth for [facility].  Evaluation Performed:  Preoperative cardiovascular risk assessment _____________   Date:  11/10/2021   Patient ID:  [name], DOB 1945-12-25, MRN [id number] Patient Location:  Home Provider location:   Office  Primary Care Provider:  Orpah Melter, MD Primary Cardiologist:  Glenetta Hew, MD  Chief Complaint / Patient Profile   76 y.o. y/o female with a h/o paroxysmal atrial fibrillation on Eliquis, CVA, hypertension, hyperlipidemia, type 2 diabetes, GERD, and obesity who is pending carpal tunnel release on 11/12/2021 with Dr. Larena Glassman and presents today for telephonic preoperative cardiovascular risk assessment.  Past Medical History    Past Medical History:  Diagnosis Date   Actinic keratosis    Followed by Dr. Allyson Sabal   Anxiety    Diabetes mellitus, type II, insulin dependent ([place])    Associated with obesity: She takes Lantus, Victoza & Actos   Diplopia    Essential hypertension    GERD (gastroesophageal reflux disease)    Hyperlipidemia    Morbid obesity with BMI of 40.0-44.9, adult ([place])    Stroke ([place]) 2021   Past Surgical History:  Procedure Laterality Date   BREAST BIOPSY  1997   LEFT HEART CATH AND CORONARY ANGIOGRAPHY N/A 01/05/2017   Procedure: LEFT HEART CATH AND CORONARY ANGIOGRAPHY;  Surgeon: Leonie Man, MD;  Location: [facility];  Service:  Cardiovascular: Angiographically normal coronary arteries with a right dominant system.  Normal EF 55-65%.   LOOP RECORDER INSERTION N/A 08/01/2019   Procedure: LOOP RECORDER INSERTION;  Surgeon: Constance Haw, MD;  Location: [facility];  Service: Cardiovascular;  Laterality: N/A;    Allergies  Allergies  Allergen Reactions   Livalo [Pitavastatin]     MYALGIAS    Sulfa Antibiotics Itching    Itching and red spots.    History of Present Illness    [name] is a 76 y.o. female who presents via audio/video conferencing for a telehealth visit today.  Pt was last seen in cardiology clinic on 07/21/2021 by Dr. Ellyn Hack.  At that time [facility] was doing well.  The patient is now pending procedure as outlined above. Since her last visit, she well from a cardiac standpoint.  She did suffer a mechanical fall in May 2023 and injured her right knee and right wrist.  She has had some decreased mobility since that time due to orthopedic concerns. Otherwise, she denies chest pain, palpitations, dyspnea, pnd, orthopnea, n, v, dizziness, syncope, edema, weight gain, or early satiety. All other systems reviewed and are otherwise negative except as noted above.   Home Medications    Prior to Admission medications   Medication Sig Start Date End Date Taking? Authorizing Provider  acetaminophen (TYLENOL) 650 MG CR tablet Take 650 mg by mouth every 8 (eight) hours as needed for pain.    [provider]  atorvastatin (LIPITOR) 40 MG tablet Take 1 tablet (40 mg total) by  mouth daily. 08/03/19 11/05/21  Barb Merino, MD  blood glucose meter kit and supplies KIT Dispense based on patient and insurance preference. Use up to four times daily as directed. (FOR ICD-9 250.00, 250.01). 08/02/19   Barb Merino, MD  carvedilol (COREG) 12.5 MG tablet Take 1 tablet (12.5 mg total) by mouth 2 (two) times daily. 11/16/19   Camnitz, Will Hassell Done, MD  diphenhydramine-acetaminophen (TYLENOL  PM) 25-500 MG TABS tablet Take 2 tablets by mouth at bedtime.    [provider]  ELIQUIS 5 MG TABS tablet TAKE ONE TABLET BY MOUTH EVERY MORNING and TAKE ONE TABLET BY MOUTH EVERY EVENING 10/19/21   Fenton, Clint R, PA  gabapentin (NEURONTIN) 100 MG capsule TAKE ONE CAPSULE BY MOUTH THREE TIMES DAILY Patient taking differently: Take 100 mg by mouth 2 (two) times daily. 11/02/21   Carole Civil, MD  Insulin Syringe-Needle U-100 (RELION INSULIN SYR .3CC/29G) 29G X 1/2" 0.3 ML MISC 1 each by Does not apply route in the morning and at bedtime. 08/02/19   Barb Merino, MD  Lancets (ONETOUCH DELICA PLUS [id number]) MISC USE 1 LANCET TO CHECK GLUCOSE UP TO 4 TIMES DAILY 08/02/19   [provider]  LANTUS SOLOSTAR 100 UNIT/ML Solostar Pen Inject 30 Units into the skin in the morning. 11/12/19   [provider]  lisinopril-hydrochlorothiazide (ZESTORETIC) 20-25 MG tablet Take 1 tablet by mouth daily. 09/17/19   [provider]  nitroGLYCERIN (NITROSTAT) 0.4 MG SL tablet Place 1 tablet (0.4 mg total) under the tongue every 5 (five) minutes as needed for chest pain. 12/31/16 11/05/21  Leonie Man, MD  ONETOUCH VERIO test strip USE 1 STRIP TO CHECK GLUCOSE UP TO 4 TIMES DAILY AS DIRECTED 08/02/19   [provider]  pioglitazone (ACTOS) 45 MG tablet Take 45 mg by mouth daily.    [provider]  RELION PEN NEEDLES 31G X 6 MM MISC USE 1 IN THE MORNING AND AT BEDTIME 08/02/19   [provider]  traMADol (ULTRAM) 50 MG tablet Take 1 tablet (50 mg total) by mouth every 12 (twelve) hours as needed. 10/20/21   Mordecai Rasmussen, MD  VICTOZA 18 MG/3ML SOPN Inject 1.8 mg into the skin daily. 05/24/20   [provider]    Physical Exam    Vital Signs:  [name] does not have vital signs available for review today.  Given telephonic nature of communication, physical exam is limited. AAOx3. NAD. Normal affect.  Speech and respirations are  unlabored.  Accessory Clinical Findings    None  Assessment & Plan    1.  Preoperative Cardiovascular Risk Assessment:  Patient with diagnosis of afib on Eliquis for anticoagulation.   According to the Revised Cardiac Risk Index (RCRI), her Perioperative Risk of Major Cardiac Event is (%): 6.6. Her Functional Capacity in METs is: 5.93 according to the Duke Activity Status Index (DASI). Therefore, based on ACC/AHA guidelines, patient would be at acceptable risk for the planned procedure without further cardiovascular testing.   Procedure: carpal tunnel release Date of procedure: 11/12/21   CHA2DS2-VASc Score = 7  This indicates a 11.2% annual risk of stroke. The patient's score is based upon: CHF History: 0 HTN History: 1 Diabetes History: 1 Stroke History: 2 Vascular Disease History: 0 Age Score: 2 Gender Score: 1   CrCl 36m/min using adjusted body weight due to obesity Platelet count 201K   Per office protocol, patient can hold Eliquis for 1 day prior to procedure. Please resume  as soon as safely possible after given elevated CV risk.   A copy of this note will be routed to requesting surgeon.  Time:   Today, I have spent 5 minutes with the patient with telehealth technology discussing medical history, symptoms, and management plan.     Lenna Sciara, NP  11/10/2021, 9:31 AM

## 2021-11-12 ENCOUNTER — Other Ambulatory Visit: Payer: Self-pay

## 2021-11-12 ENCOUNTER — Ambulatory Visit (HOSPITAL_COMMUNITY): Payer: HMO | Admitting: Certified Registered"

## 2021-11-12 ENCOUNTER — Ambulatory Visit (HOSPITAL_COMMUNITY)
Admission: RE | Admit: 2021-11-12 | Discharge: 2021-11-12 | Disposition: A | Discharge status: Home / Self Care | Payer: HMO | Attending: Orthopedic Surgery | Admitting: Orthopedic Surgery

## 2021-11-12 ENCOUNTER — Ambulatory Visit: Payer: HMO

## 2021-11-12 ENCOUNTER — Encounter (HOSPITAL_COMMUNITY)
Admission: RE | Disposition: A | Discharge status: Home / Self Care | Payer: Self-pay | Source: Home / Self Care | Attending: Orthopedic Surgery

## 2021-11-12 ENCOUNTER — Encounter (HOSPITAL_COMMUNITY): Payer: Self-pay | Admitting: Orthopedic Surgery

## 2021-11-12 ENCOUNTER — Ambulatory Visit (HOSPITAL_BASED_OUTPATIENT_CLINIC_OR_DEPARTMENT_OTHER): Payer: HMO | Admitting: Certified Registered"

## 2021-11-12 DIAGNOSIS — I10 Essential (primary) hypertension: Secondary | ICD-10-CM

## 2021-11-12 DIAGNOSIS — G5601 Carpal tunnel syndrome, right upper limb: Secondary | ICD-10-CM | POA: Diagnosis not present

## 2021-11-12 DIAGNOSIS — E119 Type 2 diabetes mellitus without complications: Secondary | ICD-10-CM

## 2021-11-12 DIAGNOSIS — I4891 Unspecified atrial fibrillation: Secondary | ICD-10-CM | POA: Diagnosis not present

## 2021-11-12 DIAGNOSIS — Z7984 Long term (current) use of oral hypoglycemic drugs: Secondary | ICD-10-CM

## 2021-11-12 DIAGNOSIS — K219 Gastro-esophageal reflux disease without esophagitis: Secondary | ICD-10-CM | POA: Insufficient documentation

## 2021-11-12 DIAGNOSIS — Z8673 Personal history of transient ischemic attack (TIA), and cerebral infarction without residual deficits: Secondary | ICD-10-CM | POA: Insufficient documentation

## 2021-11-12 HISTORY — PX: CARPAL TUNNEL RELEASE: SHX101

## 2021-11-12 LAB — GLUCOSE, CAPILLARY: Glucose-Capillary: 77 mg/dL (ref 70–99)

## 2021-11-12 SURGERY — CARPAL TUNNEL RELEASE
Anesthesia: Regional | Site: Hand | Laterality: Right

## 2021-11-12 MED ORDER — ORAL CARE MOUTH RINSE: 15.0000 mL | Freq: Once | OROMUCOSAL | Status: AC

## 2021-11-12 MED ORDER — DEXTROSE 50 % IV SOLN
50.0000 mL | Freq: Once | INTRAVENOUS | Status: AC
  Administered 2021-11-12: 50 mL via INTRAVENOUS

## 2021-11-12 MED ORDER — DEXTROSE 50 % IV SOLN
INTRAVENOUS | Status: AC
  Filled 2021-11-12: qty 50

## 2021-11-12 MED ORDER — FENTANYL CITRATE (PF) 100 MCG/2ML IJ SOLN
INTRAMUSCULAR | Status: AC
  Filled 2021-11-12: qty 2

## 2021-11-12 MED ORDER — PROPOFOL 10 MG/ML IV BOLUS
INTRAVENOUS | Status: AC
  Filled 2021-11-12: qty 20

## 2021-11-12 MED ORDER — BUPIVACAINE HCL (PF) 0.5 % IJ SOLN
INTRAMUSCULAR | Status: AC
  Filled 2021-11-12: qty 30

## 2021-11-12 MED ORDER — 0.9 % SODIUM CHLORIDE (POUR BTL) OPTIME
TOPICAL | Status: DC | PRN
  Administered 2021-11-12: 1000 mL

## 2021-11-12 MED ORDER — HYDROCODONE-ACETAMINOPHEN 5-325 MG PO TABS: 1.0000 | ORAL_TABLET | Freq: Four times a day (QID) | ORAL | 0 refills | Status: AC | PRN

## 2021-11-12 MED ORDER — CHLORHEXIDINE GLUCONATE 0.12 % MT SOLN
15.0000 mL | Freq: Once | OROMUCOSAL | Status: AC
  Administered 2021-11-12: 15 mL via OROMUCOSAL

## 2021-11-12 MED ORDER — PROPOFOL 500 MG/50ML IV EMUL
INTRAVENOUS | Status: DC | PRN
  Administered 2021-11-12: 25 ug/kg/min via INTRAVENOUS

## 2021-11-12 MED ORDER — LIDOCAINE HCL (PF) 0.5 % IJ SOLN
INTRAMUSCULAR | Status: DC | PRN
  Administered 2021-11-12: 35 mL via INTRAVENOUS

## 2021-11-12 MED ORDER — FENTANYL CITRATE (PF) 100 MCG/2ML IJ SOLN
INTRAMUSCULAR | Status: DC | PRN
  Administered 2021-11-12: 25 ug via INTRAVENOUS

## 2021-11-12 MED ORDER — ONDANSETRON HCL 4 MG PO TABS: 4.0000 mg | ORAL_TABLET | Freq: Three times a day (TID) | ORAL | 0 refills | Status: AC | PRN

## 2021-11-12 MED ORDER — PROPOFOL 10 MG/ML IV BOLUS
INTRAVENOUS | Status: DC | PRN
  Administered 2021-11-12: 20 mg via INTRAVENOUS

## 2021-11-12 MED ORDER — CEFAZOLIN SODIUM-DEXTROSE 2-4 GM/100ML-% IV SOLN
2.0000 g | INTRAVENOUS | Status: AC
  Administered 2021-11-12: 2 g via INTRAVENOUS
  Filled 2021-11-12: qty 100

## 2021-11-12 MED ORDER — LACTATED RINGERS IV SOLN
INTRAVENOUS | Status: DC
Start: 2021-11-12 — End: 2021-11-12
  Administered 2021-11-12: 1000 mL via INTRAVENOUS

## 2021-11-12 SURGICAL SUPPLY — 49 items
ADH SKN CLS APL DERMABOND .7 (GAUZE/BANDAGES/DRESSINGS) ×1
APL PRP STRL LF DISP 70% ISPRP (MISCELLANEOUS) ×1
BANDAGE ELASTIC 2 LF NS (GAUZE/BANDAGES/DRESSINGS) IMPLANT
BANDAGE ESMARK 4X12 BL STRL LF (DISPOSABLE) ×1 IMPLANT
BLADE SURG 15 STRL LF DISP TIS (BLADE) ×1 IMPLANT
BLADE SURG 15 STRL SS (BLADE) ×1
BNDG CMPR 12X4 ELC STRL LF (DISPOSABLE) ×1
BNDG CMPR 5X2 CHSV 1 LYR STRL (GAUZE/BANDAGES/DRESSINGS) ×1
BNDG CMPR MED 5X2 ELC HKLP NS (GAUZE/BANDAGES/DRESSINGS) ×1
BNDG CMPR STD VLCR NS LF 5.8X3 (GAUZE/BANDAGES/DRESSINGS) ×1
BNDG COHESIVE 2X5 TAN ST LF (GAUZE/BANDAGES/DRESSINGS) ×1 IMPLANT
BNDG ELASTIC 3X5.8 VLCR NS LF (GAUZE/BANDAGES/DRESSINGS) ×1 IMPLANT
BNDG ESMARK 4X12 BLUE STRL LF (DISPOSABLE) ×1
CHLORAPREP W/TINT 26 (MISCELLANEOUS) ×1 IMPLANT
CLOTH BEACON ORANGE TIMEOUT ST (SAFETY) ×1 IMPLANT
CORD BIPOLAR FORCEPS 12FT (ELECTRODE) ×1 IMPLANT
COVER LIGHT HANDLE STERIS (MISCELLANEOUS) ×2 IMPLANT
CUFF TOURN SGL QUICK 18X4 (TOURNIQUET CUFF) ×1 IMPLANT
DERMABOND ADVANCED (GAUZE/BANDAGES/DRESSINGS) ×1
DERMABOND ADVANCED .7 DNX12 (GAUZE/BANDAGES/DRESSINGS) ×1 IMPLANT
DRAPE HALF SHEET 40X57 (DRAPES) ×1 IMPLANT
GAUZE 4X4 16PLY ~~LOC~~+RFID DBL (SPONGE) ×1 IMPLANT
GAUZE KERLIX 2X3 DERM STRL LF (GAUZE/BANDAGES/DRESSINGS) ×1 IMPLANT
GAUZE SPONGE 4X4 12PLY STRL (GAUZE/BANDAGES/DRESSINGS) ×1 IMPLANT
GAUZE XEROFORM 1X8 LF (GAUZE/BANDAGES/DRESSINGS) ×1 IMPLANT
GLOVE BIO SURGEON STRL SZ8 (GLOVE) ×3 IMPLANT
GLOVE BIOGEL PI IND STRL 7.0 (GLOVE) ×2 IMPLANT
GLOVE BIOGEL PI INDICATOR 7.0 (GLOVE) ×2
GLOVE SRG 8 PF TXTR STRL LF DI (GLOVE) ×1 IMPLANT
GLOVE SURG UNDER POLY LF SZ8 (GLOVE) ×1
GOWN STRL REUS W/ TWL XL LVL3 (GOWN DISPOSABLE) ×1 IMPLANT
GOWN STRL REUS W/TWL LRG LVL3 (GOWN DISPOSABLE) ×1 IMPLANT
GOWN STRL REUS W/TWL XL LVL3 (GOWN DISPOSABLE) ×1
KIT TURNOVER KIT A (KITS) ×1 IMPLANT
MANIFOLD NEPTUNE II (INSTRUMENTS) ×1 IMPLANT
NDL HYPO 18GX1.5 BLUNT FILL (NEEDLE) ×1 IMPLANT
NDL HYPO 21X1.5 SAFETY (NEEDLE) ×1 IMPLANT
NEEDLE HYPO 18GX1.5 BLUNT FILL (NEEDLE) ×1 IMPLANT
NEEDLE HYPO 21X1.5 SAFETY (NEEDLE) ×1 IMPLANT
NS IRRIG 1000ML POUR BTL (IV SOLUTION) ×1 IMPLANT
PACK BASIC LIMB (CUSTOM PROCEDURE TRAY) ×1 IMPLANT
PAD ARMBOARD 7.5X6 YLW CONV (MISCELLANEOUS) ×1 IMPLANT
POSITIONER HAND ALUMI XLG (MISCELLANEOUS) ×1 IMPLANT
SET BASIN LINEN APH (SET/KITS/TRAYS/PACK) ×1 IMPLANT
SUT MON AB 3-0 SH 27 (SUTURE) ×1 IMPLANT
SUT PROLENE NAB BLUE 3-0 30IN (SUTURE) IMPLANT
SYR 20ML LL LF (SYRINGE) ×1 IMPLANT
SYR CONTROL 10ML LL (SYRINGE) ×1 IMPLANT
UNDERPAD 30X36 HEAVY ABSORB (UNDERPADS AND DIAPERS) ×1 IMPLANT

## 2021-11-12 NOTE — Discharge Instructions (Signed)
   A. Amedeo Kinsman, MD [facility] [address] [place],  [place]  [address] Phone: [phone] Fax: [phone]    [place] may remove your bandage on postop day 3 and get the hand wet.  No ointments or lotions to be applied to the incision.  Do not submerge the incision for 1 month.  FOLLOW-UP If you develop a Fever (>101.5), Redness or Drainage from the surgical incision site, please call our office to arrange for an evaluation. Please call the office to schedule a follow-up appointment for your incision check if you do not already have one, 7-10 days post-operatively.  IF YOU HAVE ANY QUESTIONS, PLEASE FEEL FREE TO CALL OUR OFFICE.  HELPFUL INFORMATION  You should wean off your narcotic medicines as soon as you are able.  Most patients will be off or using minimal narcotics before their first postop appointment.   You may be more comfortable sleeping in a semi-seated position the first few nights following surgery.  Keep a pillow propped under the elbow and forearm for comfort.  If you have a recliner type of chair it might be beneficial.    We suggest you use the pain medication the first night prior to going to bed, in order to ease any pain when the anesthesia wears off. You should avoid taking pain medications on an empty stomach as it will make you nauseous.  Do not drink alcoholic beverages or take illicit drugs when taking pain medications.  You may return to work/school in the next couple of days when you feel up to it. Desk work and typing is fine.  Pain medication may make you constipated.  Below are a few solutions to try in this order: Decrease the amount of pain medication if you aren't having pain. Drink lots of decaffeinated fluids. Drink prune juice and/or each dried prunes  If the first 3 don't work start with additional solutions Take Colace - an over-the-counter stool softener Take  Senokot - an over-the-counter laxative Take Miralax - a stronger over-the-counter laxative

## 2021-11-12 NOTE — Anesthesia Procedure Notes (Signed)
Anesthesia Regional Block: Bier block (IV Regional)   Pre-Anesthetic Checklist: , timeout performed,  Correct Patient, Correct Site, Correct Laterality,  Correct Procedure, Correct Position, site marked,  Risks and benefits discussed,  Surgical consent,  Pre-op evaluation,  At surgeon's request  Laterality: Right          Procedures:,,,,, intact distal pulses, Esmarch exsanguination,  Single tourniquet utilized    Narrative:  Start time: 11/12/2021 7:36 AM End time: 11/12/2021 7:39 AM CRNA: Karna Dupes, CRNA

## 2021-11-12 NOTE — Op Note (Signed)
Orthopaedic Surgery Operative Note (CSN: [id number])  [name]  08/26/1945 Date of Surgery: 11/12/2021   Diagnoses:  CARPAL TUNNEL SYNDROME, RIGHT HAND  Procedure: Right Open Carpal Tunnel Release   Operative Finding Successful completion of the planned procedure.  Manipulation of fingers that developed some stiffness secondary to a recent distal radius fracture.    Post-Op Diagnosis: Same Surgeons:Primary: Mordecai Rasmussen, MD Assistants: None Location: [facility] OR ROOM 3 Anesthesia: Sedation plus regional anesthesia Antibiotics: Ancef 2 g Tourniquet time:  Total Tourniquet Time Documented: Forearm (Right) - 43 minutes Total: Forearm (Right) - 43 minutes  Estimated Blood Loss: Minimal Complications: None Specimens: None Implants: * No implants in log *  Indications for Surgery:   [name] is a 76 y.o. female with symptoms of carpal tunnel syndrome in the right hand.  EMG confirmed very severe median nerve entrapment at the carpal tunnel.  Benefits and risks of operative and nonoperative management were discussed prior to surgery with the patient and informed consent form was completed.  Specific risks including infection, need for additional surgery, persistent numbness and pain, bleeding, need for further surgery and more severe complications associated with anesthesia.  She elected to proceed.  Surgical consent was finalized.    Procedure:   The patient was identified properly. Informed consent was obtained and the surgical site was marked. The patient was taken up to the OR where a Bier block was used for anesthesia.  The patient was positioned supine with her hand on a hand table.  The right arm was prepped and draped in the usual sterile fashion.  Timeout was performed before the beginning of the case.  Tourniquet was used for the above duration.  Incision was made in line with the radial border of the ring finger. The carpal tunnel transverse fascia was  identified, cleaned, and incised sharply. The common sensory branches were visualized along with the superficial palmar arch and protected.  The median nerve was protected below. Deep retractors were placed underneath the transverse carpal ligament, protecting the nerve. I released the ligament completely, and then released the proximal distal volar forearm fascia. The nerve was identified, and visualized, and protected throughout the case. No masses or abnormalities were identified in ulnar bursa.  The wounds were irrigated copiously, and the skin closed with absorbable sutures followed by a bulky dressing.  Her fingers were gently manipulated while she was still anesthetized.  We were  able to achieve a full fist after gentle stretching of her index through small fingers.  She easily achieved full extension.  Patient  tolerated this well, with no complications.   Post-operative plan:  The patient will be discharged home from the PACU. WBAT on the operative extremity; limit lifting to nothing more than a coffee cup until follow up appointment   DVT prophylaxis: she can resume her anticoagulation on POD#1 Pain control with PRN pain medication preferring oral medicines.   Follow up plan will be scheduled in approximately 7-10 days for incision check

## 2021-11-12 NOTE — Anesthesia Preprocedure Evaluation (Signed)
Anesthesia Evaluation  Patient identified by MRN, date of birth, ID band Patient awake    Reviewed: Allergy & Precautions, NPO status , Patient's Chart, lab work & pertinent test results  Airway Mallampati: III  TM Distance: >3 FB Neck ROM: Full    Dental  (+) Dental Advisory Given, Teeth Intact   Pulmonary neg pulmonary ROS,    Pulmonary exam normal breath sounds clear to auscultation       Cardiovascular Exercise Tolerance: Good hypertension, Pt. on medications + dysrhythmias Atrial Fibrillation  Rhythm:Regular Rate:Normal + Systolic murmurs    Neuro/Psych Anxiety CVA negative psych ROS   GI/Hepatic Neg liver ROS, GERD  Medicated and Controlled,  Endo/Other  diabetes, Well Controlled, Type 2, Oral Hypoglycemic Agents  Renal/GU negative Renal ROS  negative genitourinary   Musculoskeletal negative musculoskeletal ROS (+)   Abdominal   Peds negative pediatric ROS (+)  Hematology negative hematology ROS (+)   Anesthesia Other Findings   Reproductive/Obstetrics negative OB ROS                           Anesthesia Physical Anesthesia Plan  ASA: 3  Anesthesia Plan: Bier Block and Bier Block-LIDOCAINE ONLY   Post-op Pain Management: Minimal or no pain anticipated   Induction: Intravenous  PONV Risk Score and Plan: Ondansetron  Airway Management Planned: Nasal Cannula, Natural Airway and Simple Face Mask  Additional Equipment:   Intra-op Plan:   Post-operative Plan:   Informed Consent: I have reviewed the patients History and Physical, chart, labs and discussed the procedure including the risks, benefits and alternatives for the proposed anesthesia with the patient or authorized representative who has indicated his/her understanding and acceptance.     Dental advisory given  Plan Discussed with: CRNA and Surgeon  Anesthesia Plan Comments:         Anesthesia Quick  Evaluation

## 2021-11-12 NOTE — Interval H&P Note (Signed)
History and Physical Interval Note:  11/12/2021 7:11 AM  [name]  has presented today for surgery, with the diagnosis of CARPAL TUNNEL SYNDROME, RIGHT HAND.  The various methods of treatment have been discussed with the patient and family. After consideration of risks, benefits and other options for treatment, the patient has consented to  Procedure(s) with comments: CARPAL TUNNEL RELEASE (Right) - pt knows to arrive at 6:15 as a surgical intervention.  The patient's history has been reviewed, patient examined, no change in status, stable for surgery.  I have reviewed the patient's chart and labs.  Questions were answered to the patient's satisfaction.     Mordecai Rasmussen

## 2021-11-12 NOTE — Anesthesia Postprocedure Evaluation (Signed)
Anesthesia Post Note  Patient: [name]  Procedure(s) Performed: CARPAL TUNNEL RELEASE (Right: Hand)  Patient location during evaluation: Phase II Anesthesia Type: Bier Block Level of consciousness: awake and alert and oriented Pain management: pain level controlled Vital Signs Assessment: post-procedure vital signs reviewed and stable Respiratory status: spontaneous breathing, nonlabored ventilation and respiratory function stable Cardiovascular status: blood pressure returned to baseline and stable Postop Assessment: no apparent nausea or vomiting Anesthetic complications: no   No notable events documented.   Last Vitals:  Vitals:   11/12/21 0653 11/12/21 0832  BP: (!) 140/44 (!) 135/58  Pulse:  69  Resp: 14 12  Temp: 36.8 C 36.6 C  SpO2: 99% 96%    Last Pain:  Vitals:   11/12/21 0832  TempSrc: Oral  PainSc: 0-No pain                  C 

## 2021-11-12 NOTE — Transfer of Care (Signed)
Immediate Anesthesia Transfer of Care Note  Patient: [name]  Procedure(s) Performed: CARPAL TUNNEL RELEASE (Right: Hand)  Patient Location: Short Stay  Anesthesia Type:Regional  Level of Consciousness: awake, alert  and oriented  Airway & Oxygen Therapy: Patient Spontanous Breathing  Post-op Assessment: Report given to RN, Post -op Vital signs reviewed and stable and Patient moving all extremities X 4  Post vital signs: Reviewed and stable  Last Vitals:  Vitals Value Taken Time  BP 135/58 11/12/21 0832  Temp 36.6 C 11/12/21 0832  Pulse 69 11/12/21 0832  Resp 12 11/12/21 0832  SpO2 96 % 11/12/21 0832    Last Pain:  Vitals:   11/12/21 0832  TempSrc: Oral  PainSc: 0-No pain      Patients Stated Pain Goal: 8 (72/82/06 0156)  Complications: No notable events documented.

## 2021-11-16 ENCOUNTER — Ambulatory Visit: Payer: HMO | Admitting: Physical Therapy

## 2021-11-16 ENCOUNTER — Encounter (HOSPITAL_COMMUNITY): Payer: Self-pay | Admitting: Orthopedic Surgery

## 2021-11-17 ENCOUNTER — Ambulatory Visit: Payer: HMO | Admitting: Occupational Therapy

## 2021-11-17 ENCOUNTER — Telehealth: Payer: Self-pay

## 2021-11-17 ENCOUNTER — Ambulatory Visit: Payer: HMO

## 2021-11-17 DIAGNOSIS — R2689 Other abnormalities of gait and mobility: Secondary | ICD-10-CM | POA: Diagnosis not present

## 2021-11-17 DIAGNOSIS — R2681 Unsteadiness on feet: Secondary | ICD-10-CM

## 2021-11-17 DIAGNOSIS — Z9889 Other specified postprocedural states: Secondary | ICD-10-CM

## 2021-11-17 DIAGNOSIS — M6281 Muscle weakness (generalized): Secondary | ICD-10-CM

## 2021-11-17 NOTE — Telephone Encounter (Signed)
Dr. Amedeo Kinsman, [name] was previously being seen by OT for a Colles fx and carpal tunnel. I know she recently had her carpal tunnel release procedure. If you could please put in an order to resume OT and with any associated precautions you wish for her to have in regards to her therapy. Her limited use of her R UE is impacting her balance and gait with PT and so resuming OT in a timely fashion is crucial. Per the patient, she also needs to make an appt to have her stitches removed. If your office could please contact her to schedule this appt as she has tried to contact your office multiple times to do so.   Thank you, Debbora Dus, PT, DPT, [facility] [address] [place], [place]  [address] Phone:  [phone] Fax:  [phone]

## 2021-11-17 NOTE — Therapy (Signed)
OUTPATIENT PHYSICAL THERAPY TREATMENT NOTE   Patient Name: [name] MRN: [id number] DOB:14-May-1945, 76 y.o., female Today's Date: 11/17/2021  PCP: Orpah Melter, MD  REFERRING PROVIDER: Larena Glassman, MD    END OF SESSION:   PT End of Session - 11/17/21 1104     Visit Number 12    Number of Visits 21    Date for PT Re-Evaluation 12/15/21    Authorization Type [employer]    Progress Note Due on Visit 41    PT Start Time 1105   patient late   PT Stop Time 1145    PT Time Calculation (min) 40 min    Equipment Utilized During Treatment Gait belt    Activity Tolerance Patient tolerated treatment well    Behavior During Therapy WFL for tasks assessed/performed              Past Medical History:  Diagnosis Date   Actinic keratosis    Followed by Dr. Allyson Sabal   Anxiety    Diabetes mellitus, type II, insulin dependent ([place])    Associated with obesity: She takes Lantus, Victoza & Actos   Diplopia    Essential hypertension    GERD (gastroesophageal reflux disease)    Hyperlipidemia    Morbid obesity with BMI of 40.0-44.9, adult ([place])    Stroke ([place]) 2021   Past Surgical History:  Procedure Laterality Date   BREAST BIOPSY  1997   CARPAL TUNNEL RELEASE Right 11/12/2021   Procedure: CARPAL TUNNEL RELEASE;  Surgeon: Mordecai Rasmussen, MD;  Location: [facility];  Service: Orthopedics;  Laterality: Right;  pt knows to arrive at 6:15   LEFT HEART CATH AND CORONARY ANGIOGRAPHY N/A 01/05/2017   Procedure: LEFT HEART CATH AND CORONARY ANGIOGRAPHY;  Surgeon: Leonie Man, MD;  Location: [facility];  Service: Cardiovascular: Angiographically normal coronary arteries with a right dominant system.  Normal EF 55-65%.   LOOP RECORDER INSERTION N/A 08/01/2019   Procedure: LOOP RECORDER INSERTION;  Surgeon: Constance Haw, MD;  Location: [facility];  Service: Cardiovascular;  Laterality: N/A;   Patient Active Problem List   Diagnosis Date Noted    Paroxysmal atrial fibrillation ([place]) 08/08/2019   Secondary hypercoagulable state ([place]) 08/08/2019   Acute cerebrovascular accident of cerebellum ([place]) 07/31/2019   GERD (gastroesophageal reflux disease)    Leukocytosis    Morbid obesity ([place]) 01/25/2018   Chest pain with low risk for cardiac etiology 12/31/2016   Essential hypertension 12/31/2016   Hyperlipidemia 12/31/2016   DM (diabetes mellitus), type 2 with complications ([place]) 92/42/6834    REFERRING DIAG: H96.222,L79.89 (ICD-10-CM) - Chronic pain of left knee    THERAPY DIAG:  Muscle weakness (generalized)  Unsteadiness on feet  Other abnormalities of gait and mobility  Rationale for Evaluation and Treatment Rehabilitation  PERTINENT HISTORY: DM II, GERD, hyperlipidemia, diplopia, obesity, R Colles fx   PRECAUTIONS: Fall, RUE coffee cup weightbearing   SUBJECTIVE: Patient reports doing well. Has not been able to contact MD re: stitch removal and clearance for OT. No overt s/s of infection to R wrist sx site.   PAIN:  Are you having pain? Yes: NPRS scale: 2/10 Pain location: R wrist Pain description: surgical pain, stinging    OBJECTIVE:    TODAY'S TREATMENT:  Gait:  -trial SPC 115' x2  -no AD 115' x1   NMR:  -anterior rock _+ weight shift to target -standing on foam toe tap to target + MInA  -Scifit x5 mins B UE/LE  PATIENT EDUCATION: Education details: continue HEP, RW vs AD Person educated: Patient and Child(ren) Education method: Explanation Education comprehension: verbalized understanding and needs further education     HOME EXERCISE PROGRAM: Access Code: [email or web address] URL: [email or web address] Date: 10/29/2021 Prepared by: Estevan Ryder  Exercises - Seated knee extension   - 1 x daily - 7 x weekly - 3 sets - 10 reps - 5 second hold - Supine Bridge  - 1 x daily - 7 x weekly - 3 sets - 10 reps - Seated Knee Flexion Extension AROM   - 1 x daily - 7 x weekly - 3 sets - 10 reps -  Standing March with Counter Support  - 1 x daily - 7 x weekly - 3 sets - 10 reps - Standing Hip Abduction with Counter Support  - 1 x daily - 7 x weekly - 3 sets - 10 reps - Standing Hip Extension with Counter Support  - 1 x daily - 7 x weekly - 3 sets - 10 reps - Side Stepping with Counter Support  - 1 x daily - 7 x weekly - 3 sets - 10 reps - Backward Walking with Counter Support  - 1 x daily - 7 x weekly - 3 sets - 10 reps - Heel Raises with Counter Support  - 1 x daily - 7 x weekly - 3 sets - 10 reps - Seated Gastroc Stretch with Strap  - 1 x daily - 7 x weekly - 3 sets - 10 reps   GOALS: Goals reviewed with patient? Yes   SHORT TERM GOALS: Target date: 11/03/2021   Pt will be independent with initial HEP for improved strength and balance  Baseline: provided  Goal status: MET   2.  Pt will improve gait speed to >/= .68ms to demonstrate improved community ambulation   Baseline: .119m; .247m(10/22/21); 0.61 m/s on 8/7  Goal status: MET   3.  Pt will improve FGA to >/= 10/30 to demonstrate improved balance and reduced fall risk   Baseline: 5/30; 11/30 (10/22/21)  Goal status: MET   4.  Patient will improve L knee AROM flexion to at least 90* Baseline: 72*;  117* (7/27)  Goal status: MET   5.  Patient will improve L knee AROM extension to -15* Baseline: -22*; -10* (7/27)  Goal status: MET   LONG TERM GOALS: Target date: 12/15/2021   Pt will be independent with final HEP for improved strength and balance  Baseline: to be provided Goal status: INITIAL   2.  Pt will improve FGA to >/= 20/30 to demonstrate improved balance and reduced fall risk   Baseline: 5/30 Goal status: INITIAL   3.  Pt will improve gait speed to >/= .38m63mo demonstrate improved community ambulation   Baseline: .69m/62mal status: INITIAL   4.  Patient will improve L knee AROM flexion to at least 120* Baseline: 72* Goal status: REVISED   5.  Patient will improve L knee AROM extension to better  than -5* Baseline: -22* Goal status: REVISED       ASSESSMENT:   CLINICAL IMPRESSION: Patient seen for skilled PT session with emphasis on gait retraining and balance. Patient remains with learned nonuse of R UE impacting her ability to use RW safely. Trialed SPC and patient appeared much more confident, but still with decreased gait speed and general instability. External targets used to encourage longer step length B with some noted carryover to over ground gait. PT sent a telephone call  to MD to attempt to have OT order placed to resume therapy and to allow patient to make appt to have her stitches removed. Continue POC.     OBJECTIVE IMPAIRMENTS Abnormal gait, cardiopulmonary status limiting activity, decreased activity tolerance, decreased balance, decreased coordination, decreased endurance, decreased knowledge of condition, decreased knowledge of use of DME, decreased mobility, difficulty walking, decreased ROM, decreased strength, hypomobility, increased edema, impaired sensation, and obesity.    ACTIVITY LIMITATIONS carrying, lifting, bending, sitting, standing, squatting, stairs, transfers, bed mobility, and locomotion level   PARTICIPATION LIMITATIONS: meal prep, cleaning, laundry, driving, shopping, community activity, and yard work   PERSONAL FACTORS Age, Behavior pattern, Fitness, Past/current experiences, and 3+ comorbidities: hyperlipidemia, DM II, obesity  are also affecting patient's functional outcome.    REHAB POTENTIAL: Good   CLINICAL DECISION MAKING: Stable/uncomplicated   EVALUATION COMPLEXITY: Low   PLAN: PT FREQUENCY: 2x/week   PT DURATION: 10 weeks   PLANNED INTERVENTIONS: Therapeutic exercises, Therapeutic activity, Neuromuscular re-education, Balance training, Gait training, Patient/Family education, Joint mobilization, Stair training, Vestibular training, Visual/preceptual remediation/compensation, Orthotic/Fit training, DME instructions, Aquatic Therapy,  Dry Needling, Electrical stimulation, Cryotherapy, Moist heat, Manual lymph drainage, Compression bandaging, Splintting, Taping, Manual therapy, and Re-evaluation   PLAN FOR NEXT SESSION: scifit for endurance and ROM, L weight shifting, ambulatory balance, standing balance/gait on compliant surfaces, fun mat, confidence with mobility!    Debbora Dus, PT, DPT Debbora Dus, PT, DPT, CBIS  11/17/2021, 11:58 AM

## 2021-11-18 ENCOUNTER — Telehealth: Payer: Self-pay | Admitting: Orthopedic Surgery

## 2021-11-18 NOTE — Telephone Encounter (Signed)
Patient called to schedule her post op appt and she also needs a letter sent to her PT Therapist to do occupational therapy for her hand   Please call her back at [phone]

## 2021-11-19 ENCOUNTER — Ambulatory Visit: Payer: HMO

## 2021-11-19 ENCOUNTER — Ambulatory Visit: Payer: HMO | Admitting: Occupational Therapy

## 2021-11-19 DIAGNOSIS — M25531 Pain in right wrist: Secondary | ICD-10-CM

## 2021-11-19 DIAGNOSIS — R2681 Unsteadiness on feet: Secondary | ICD-10-CM

## 2021-11-19 DIAGNOSIS — R278 Other lack of coordination: Secondary | ICD-10-CM

## 2021-11-19 DIAGNOSIS — R2689 Other abnormalities of gait and mobility: Secondary | ICD-10-CM

## 2021-11-19 DIAGNOSIS — M25631 Stiffness of right wrist, not elsewhere classified: Secondary | ICD-10-CM

## 2021-11-19 DIAGNOSIS — M6281 Muscle weakness (generalized): Secondary | ICD-10-CM

## 2021-11-19 DIAGNOSIS — M25641 Stiffness of right hand, not elsewhere classified: Secondary | ICD-10-CM

## 2021-11-19 DIAGNOSIS — R600 Localized edema: Secondary | ICD-10-CM

## 2021-11-19 NOTE — Therapy (Signed)
OUTPATIENT OCCUPATIONAL THERAPY ORTHO RE-EVALUATION  Patient Name: [name] MRN: [id number] DOB:1946/02/07, 76 y.o., female Today's Date: 11/19/2021  PCP: Orpah Melter, MD REFERRING PROVIDER: Mordecai Rasmussen, MD    OT End of Session - 11/19/21 1221     Visit Number 1    Number of Visits 16    Date for OT Re-Evaluation 01/19/22    Authorization Type [employer]    Progress Note Due on Visit 10    OT Start Time 1100    OT Stop Time 1145    OT Time Calculation (min) 45 min    Activity Tolerance Patient tolerated treatment well    Behavior During Therapy WFL for tasks assessed/performed              Past Medical History:  Diagnosis Date   Actinic keratosis    Followed by Dr. Allyson Sabal   Anxiety    Diabetes mellitus, type II, insulin dependent ([place])    Associated with obesity: She takes Lantus, Victoza & Actos   Diplopia    Essential hypertension    GERD (gastroesophageal reflux disease)    Hyperlipidemia    Morbid obesity with BMI of 40.0-44.9, adult ([place])    Stroke ([place]) 2021   Past Surgical History:  Procedure Laterality Date   BREAST BIOPSY  1997   CARPAL TUNNEL RELEASE Right 11/12/2021   Procedure: CARPAL TUNNEL RELEASE;  Surgeon: Mordecai Rasmussen, MD;  Location: [facility];  Service: Orthopedics;  Laterality: Right;  pt knows to arrive at 6:15   LEFT HEART CATH AND CORONARY ANGIOGRAPHY N/A 01/05/2017   Procedure: LEFT HEART CATH AND CORONARY ANGIOGRAPHY;  Surgeon: Leonie Man, MD;  Location: [facility];  Service: Cardiovascular: Angiographically normal coronary arteries with a right dominant system.  Normal EF 55-65%.   LOOP RECORDER INSERTION N/A 08/01/2019   Procedure: LOOP RECORDER INSERTION;  Surgeon: Constance Haw, MD;  Location: [facility];  Service: Cardiovascular;  Laterality: N/A;   Patient Active Problem List   Diagnosis Date Noted   Paroxysmal atrial fibrillation ([place]) 08/08/2019   Secondary hypercoagulable  state ([place]) 08/08/2019   Acute cerebrovascular accident of cerebellum ([place]) 07/31/2019   GERD (gastroesophageal reflux disease)    Leukocytosis    Morbid obesity ([place]) 01/25/2018   Chest pain with low risk for cardiac etiology 12/31/2016   Essential hypertension 12/31/2016   Hyperlipidemia 12/31/2016   DM (diabetes mellitus), type 2 with complications ([place]) 85/04/7739    ONSET DATE: NEW referral 11/17/21, 10/20/2021 (referral date)   REFERRING DIAG: Z98.890 (ICD-10-CM) - S/P carpal tunnel release recently on 11/12/21 S52.531D (ICD-10-CM) - Closed Colles' fracture of right radius with routine healing, subsequent encounter (casted for 6 weeks)  **Resume OT following carpal tunnel release surgery   THERAPY DIAG:  Stiffness of right wrist, not elsewhere classified  Stiffness of right hand, not elsewhere classified  Pain in right wrist  Other lack of coordination  Muscle weakness (generalized)  Localized edema  Rationale for Evaluation and Treatment Rehabilitation  SUBJECTIVE:   SUBJECTIVE STATEMENT: The burning has gone, but the numbness is still there Pt accompanied by:  son  PERTINENT HISTORY: recent CTR 11/12/21, Rt distal radius fx 08/06/21 (casted 6 weeks), CTS, DM, HTN, A-fib, CVA 2021  PRECAUTIONS: Fall and Other: Loop recorder  WEIGHT BEARING RESTRICTIONS No  PAIN:  Are you having pain? Yes: NPRS scale: 0/10 Pain location: Rt ulnar wrist and all fingertips except small finger Pain description: achy at wrist,  numbness at fingers Aggravating factors: nothing, but worse at night Relieving factors: nothing  FALLS: Has patient fallen in last 6 months? Yes. Number of falls 1  LIVING ENVIRONMENT: Lives with: lives with their family Pt lives in 1 story home w/ basement, 5 steps to enter house Has following equipment at home: Lobbyist, Environmental consultant - 2 wheeled, shower chair, and Grab bars  PLOF: Independent  PATIENT GOALS hold things in Rt hand better to write  and eat, improve movement and strength Rt hand  OBJECTIVE:   HAND DOMINANCE: Right  ADLs: Overall ADLs: Mod I using Lt non dominant hand as dominant hand Transfers/ambulation related to ADLs: Eating: using both hands Grooming: using Lt hand predominantly UB Dressing: mod I  LB Dressing: mod I w/ slip on shoes, elastic pants Toileting: mod I (Lt hand to wipe)  Bathing: mod I Tub Shower transfers: Mod I w/ grab bars and shower seat   UPPER EXTREMITY ROM   BUE AROM at shoulders and elbows WFLs. Composite finger flexion approx 50% Rt hand w/ MP flexion WFL's, tighter at PIP joints - remains same after CTR  **BELOW TABLE: first numbers are 10/29/21 eval, second number is 11/19/21 re-eval  Active ROM Right eval Left eval  Shoulder flexion    Shoulder abduction    Shoulder adduction    Shoulder extension    Shoulder internal rotation    Shoulder external rotation    Elbow flexion    Elbow extension    Wrist flexion 10/29/21 = 35,  11/19/21 = 38   Wrist extension 28 25   Wrist ulnar deviation    Wrist radial deviation    Wrist pronation WFLs   Wrist supination 75 75   (Blank rows = not tested)  Active ROM Right eval Left eval  Thumb MCP (0-60)    Thumb IP (0-80)    Thumb Radial abd/add (0-55)     Thumb Palmar abd/add (0-45) 65 70 70   Thumb Opposition to Small Finger     Index MCP (0-90)     Index PIP (0-100) 65 65    Index DIP (0-70)      Long MCP (0-90)      Long PIP (0-100) 70  70    Long DIP (0-70)      Ring MCP (0-90)      Ring PIP (0-100) 72  70    Ring DIP (0-70)      Little MCP (0-90)      Little PIP (0-100) 55 60    Little DIP (0-70)      (Blank rows = not tested)    HAND FUNCTION: **Did NOT reassess grip or pinch strength on 11/19/21 d/t current precautions from CTR  10/29/21: Grip strength: Right: 4.8 lbs; Left: 33.5 lbs, Lateral pinch: Right: 3 lbs, Left: 11 lbs, and 3 point pinch: Right: 1 lbs, Left: 11 lbs  COORDINATION: 11/19/21: 9 hole peg  test: RT = 63.66 sec, handwriting is still 100% legibile  10/29/21: 9 Hole Peg test: Right: 70.47 sec; Left: 24.46 sec Handwriting: Pt can write name 100% legible in cursive and print w/ regular pen but slow and difficult  SENSATION: Numbness in first 3 fingers  consistent w/ CTS  however burning has stopped since CTR   EDEMA: Mild in forearm and base of dorsal thumb    OBSERVATIONS: Pt also w/ arthritis in hands. Tightness more in PIP joints than MP joints of fingers Rt hand. Also noted thumb CMC  instability (? From arthritis)    TODAY'S TREATMENT:  Pt instructed that she can continue to use Rt hand for eating and writing, but not to resume A/ROM HEP until 10-14 days from CTR surgery (Pt instructed to begin ROM on Sunday or Monday as this will be 10 days out    HOME EXERCISE PROGRAM: No HEP formally issued today d/t current restrictions GOALS:  SHORT TERM GOALS: Target date: 12/20/21  Independent with HEP for wrist and hand ROM and hand strengthening Baseline: Goal status: INITIAL  2.  Pt to increase Rt wrist flex by 5* or greater and extension by 10* or greater Baseline: flex = 38*, ext = 25* Goal status: INITIAL  3.  Pt to improve PIP flexion digits 2-5 Rt hand by 10* or greater Baseline: 65*, 70*, 72*, 60*  Goal status: INITIAL  4.  Pt to return to using Rt hand as dominant hand for BADLS including writing and eating Baseline: using Rt hand some for eating since CTR Goal status: INITIAL  5.  Pt to oppose Rt thumb to 5th digit  Baseline: 3rd digit only Goal status: INITIAL  6.  Pt to improve Rt grip strength to 8 lbs or greater Baseline: 4 lbs on 10/29/21 Goal status: INITIAL  LONG TERM GOALS: Target date: 01/19/22    Improve coordination Rt hand as evidenced by performing 9 hole peg test in 50 sec or less Baseline: 70.47 sec, 11/19/21 = 63 sec Goal status: Revised  2.  Pt to improve grip strength Rt hand to 15 lbs  Baseline: 4 lbs on 10/29/21 Goal status:  INITIAL  3.  Pt to improve lateral and tip pinch Rt hand by 3 lbs each Baseline: Lat = 3, 3 tip = 1 Goal status: INITIAL  4.  Pt to verbalize understanding of pain reduction strategies for Rt hand and wrist Baseline:  Goal status: INITIAL  5.  Pt to be able to lift and hold 3 lb object Rt hand w/o drops Baseline:  Goal status: INITIAL  ASSESSMENT:  CLINICAL IMPRESSION: Patient returns today for re-evaluation with new diagnosis and new referral s/p CTR on 11/12/21. Pt was reassessed on ROM and 9 hole peg test, however grip and pinch strength was not reassessed d/t current precautions from CTR. Pt is approx 10 weeks out from distal radius fx. Pt's POC and goals were adjusted prn.   PERFORMANCE DEFICITS in functional skills including ADLs, IADLs, coordination, dexterity, sensation, edema, ROM, strength, pain, flexibility, FMC, GMC, balance, decreased knowledge of use of DME, and UE functional use.   IMPAIRMENTS are limiting patient from ADLs, IADLs, and leisure.   COMORBIDITIES has co-morbidities such as CTS  that affects occupational performance. Patient will benefit from skilled OT to address above impairments and improve overall function.  MODIFICATION OR ASSISTANCE TO COMPLETE EVALUATION: No modification of tasks or assist necessary to complete an evaluation.  OT OCCUPATIONAL PROFILE AND HISTORY: Problem focused assessment: Including review of records relating to presenting problem.  CLINICAL DECISION MAKING: Moderate - several treatment options, min-mod task modification necessary  REHAB POTENTIAL: Fair time since onset, severe CTS  EVALUATION COMPLEXITY: Moderate      PLAN: OT FREQUENCY: 2x/week  OT DURATION: 8 weeks (beginning 11/19/21)   PLANNED INTERVENTIONS: self care/ADL training, therapeutic exercise, therapeutic activity, manual therapy, passive range of motion, functional mobility training, splinting, electrical stimulation, ultrasound, paraffin, fluidotherapy,  moist heat, cryotherapy, contrast bath, patient/family education, coping strategies training, DME and/or AE instructions, and Re-evaluation  RECOMMENDED OTHER SERVICES:  NONE at this time  CONSULTED AND AGREED WITH PLAN OF CARE: Patient and family member/caregiver  PLAN FOR NEXT SESSION: Issue formal HEP Rt hand for tendon gliding, scar massage per protocol    Hans Eden, OT 11/19/2021, 12:25 PM

## 2021-11-19 NOTE — Therapy (Signed)
OUTPATIENT PHYSICAL THERAPY TREATMENT NOTE   Patient Name: [name] MRN: [id number] DOB:January 30, 1946, 76 y.o., female Today's Date: 11/19/2021  PCP: Orpah Melter, MD  REFERRING PROVIDER: Larena Glassman, MD    END OF SESSION:   PT End of Session - 11/19/21 0934     Visit Number 13    Number of Visits 21    Date for PT Re-Evaluation 12/15/21    Authorization Type [employer]    Progress Note Due on Visit 16    PT Start Time 0932    PT Stop Time 1017    PT Time Calculation (min) 45 min    Equipment Utilized During Treatment Gait belt    Activity Tolerance Patient tolerated treatment well    Behavior During Therapy WFL for tasks assessed/performed              Past Medical History:  Diagnosis Date   Actinic keratosis    Followed by Dr. Allyson Sabal   Anxiety    Diabetes mellitus, type II, insulin dependent ([place])    Associated with obesity: She takes Lantus, Victoza & Actos   Diplopia    Essential hypertension    GERD (gastroesophageal reflux disease)    Hyperlipidemia    Morbid obesity with BMI of 40.0-44.9, adult ([place])    Stroke ([place]) 2021   Past Surgical History:  Procedure Laterality Date   BREAST BIOPSY  1997   CARPAL TUNNEL RELEASE Right 11/12/2021   Procedure: CARPAL TUNNEL RELEASE;  Surgeon: Mordecai Rasmussen, MD;  Location: [facility];  Service: Orthopedics;  Laterality: Right;  pt knows to arrive at 6:15   LEFT HEART CATH AND CORONARY ANGIOGRAPHY N/A 01/05/2017   Procedure: LEFT HEART CATH AND CORONARY ANGIOGRAPHY;  Surgeon: Leonie Man, MD;  Location: [facility];  Service: Cardiovascular: Angiographically normal coronary arteries with a right dominant system.  Normal EF 55-65%.   LOOP RECORDER INSERTION N/A 08/01/2019   Procedure: LOOP RECORDER INSERTION;  Surgeon: Constance Haw, MD;  Location: [facility];  Service: Cardiovascular;  Laterality: N/A;   Patient Active Problem List   Diagnosis Date Noted   Paroxysmal atrial  fibrillation ([place]) 08/08/2019   Secondary hypercoagulable state ([place]) 08/08/2019   Acute cerebrovascular accident of cerebellum ([place]) 07/31/2019   GERD (gastroesophageal reflux disease)    Leukocytosis    Morbid obesity ([place]) 01/25/2018   Chest pain with low risk for cardiac etiology 12/31/2016   Essential hypertension 12/31/2016   Hyperlipidemia 12/31/2016   DM (diabetes mellitus), type 2 with complications ([place]) 85/27/7824    REFERRING DIAG: M35.361,W43.15 (ICD-10-CM) - Chronic pain of left knee    THERAPY DIAG:  Unsteadiness on feet  Other abnormalities of gait and mobility  Other lack of coordination  Rationale for Evaluation and Treatment Rehabilitation  PERTINENT HISTORY: DM II, GERD, hyperlipidemia, diplopia, obesity, R Colles fx   PRECAUTIONS: Fall, RUE coffee cup weightbearing   SUBJECTIVE: Patient reports doing well. Was  able to get in contact with MD re: return to OT. States hand is feeling better. Did stumble this AM, but able to recover.  PAIN:  Are you having pain? No    OBJECTIVE:    TODAY'S TREATMENT:  Gait:  -no AD 115' x2  NMR:  -immediate standing balance on foam sit <> stand x6 no UE support + CGA -standing on foam EO/EC, NBOS + CGA (slight postural sway, but no overt LOB)  -in // bars: walking over mat fw/backward, lateral stepping  -standing on  mat in // bars tapping cones-> crossing midline  -tandem walking with U UE support -toe taps to bosu ball -> lateral tapping  -Scifit x5 mins B UE/LE   PATIENT EDUCATION: Education details: continue HEP, installing grab bar for shower entrance, return to driving (practicing getting in and out of car on drivers side)  Person educated: Patient and Child(ren) Education method: Explanation Education comprehension: verbalized understanding and needs further education     HOME EXERCISE PROGRAM: Access Code: [email or web address] URL: [email or web address] Date: 10/29/2021 Prepared by: Estevan Ryder  Exercises - Seated knee extension   - 1 x daily - 7 x weekly - 3 sets - 10 reps - 5 second hold - Supine Bridge  - 1 x daily - 7 x weekly - 3 sets - 10 reps - Seated Knee Flexion Extension AROM   - 1 x daily - 7 x weekly - 3 sets - 10 reps - Standing March with Counter Support  - 1 x daily - 7 x weekly - 3 sets - 10 reps - Standing Hip Abduction with Counter Support  - 1 x daily - 7 x weekly - 3 sets - 10 reps - Standing Hip Extension with Counter Support  - 1 x daily - 7 x weekly - 3 sets - 10 reps - Side Stepping with Counter Support  - 1 x daily - 7 x weekly - 3 sets - 10 reps - Backward Walking with Counter Support  - 1 x daily - 7 x weekly - 3 sets - 10 reps - Heel Raises with Counter Support  - 1 x daily - 7 x weekly - 3 sets - 10 reps - Seated Gastroc Stretch with Strap  - 1 x daily - 7 x weekly - 3 sets - 10 reps   GOALS: Goals reviewed with patient? Yes   SHORT TERM GOALS: Target date: 11/03/2021   Pt will be independent with initial HEP for improved strength and balance  Baseline: provided  Goal status: MET   2.  Pt will improve gait speed to >/= .57ms to demonstrate improved community ambulation   Baseline: .114m; .2464m(10/22/21); 0.61 m/s on 8/7  Goal status: MET   3.  Pt will improve FGA to >/= 10/30 to demonstrate improved balance and reduced fall risk   Baseline: 5/30; 11/30 (10/22/21)  Goal status: MET   4.  Patient will improve L knee AROM flexion to at least 90* Baseline: 72*;  117* (7/27)  Goal status: MET   5.  Patient will improve L knee AROM extension to -15* Baseline: -22*; -10* (7/27)  Goal status: MET   LONG TERM GOALS: Target date: 12/15/2021   Pt will be independent with final HEP for improved strength and balance  Baseline: to be provided Goal status: INITIAL   2.  Pt will improve FGA to >/= 20/30 to demonstrate improved balance and reduced fall risk   Baseline: 5/30 Goal status: INITIAL   3.  Pt will improve gait speed to >/=  .30m37mo demonstrate improved community ambulation   Baseline: .82m/7mal status: INITIAL   4.  Patient will improve L knee AROM flexion to at least 120* Baseline: 72* Goal status: REVISED   5.  Patient will improve L knee AROM extension to better than -5* Baseline: -22* Goal status: REVISED       ASSESSMENT:   CLINICAL IMPRESSION: Patient seen for skilled PT session with emphasis on gait and balance retraining. Patient remains with slight trendelenburg gait  pattern with no AD, but not much improvement in this when using an AD either. No overt LOB. Patient improving her ability to fully shift weight and maintain stability in SLS for short period of time, on uneven/compliant surfaces as well. But due to inadequate lateral hip stabilizer strength/endurance, patient limited in her ability to maintain a SLS position. Extensive conversation re: stepping over short threshold into shower and benefit from installing a grab bar if possible. Also, patients ability to return to driving. Continue POC.     OBJECTIVE IMPAIRMENTS Abnormal gait, cardiopulmonary status limiting activity, decreased activity tolerance, decreased balance, decreased coordination, decreased endurance, decreased knowledge of condition, decreased knowledge of use of DME, decreased mobility, difficulty walking, decreased ROM, decreased strength, hypomobility, increased edema, impaired sensation, and obesity.    ACTIVITY LIMITATIONS carrying, lifting, bending, sitting, standing, squatting, stairs, transfers, bed mobility, and locomotion level   PARTICIPATION LIMITATIONS: meal prep, cleaning, laundry, driving, shopping, community activity, and yard work   PERSONAL FACTORS Age, Behavior pattern, Fitness, Past/current experiences, and 3+ comorbidities: hyperlipidemia, DM II, obesity  are also affecting patient's functional outcome.    REHAB POTENTIAL: Good   CLINICAL DECISION MAKING: Stable/uncomplicated   EVALUATION  COMPLEXITY: Low   PLAN: PT FREQUENCY: 2x/week   PT DURATION: 10 weeks   PLANNED INTERVENTIONS: Therapeutic exercises, Therapeutic activity, Neuromuscular re-education, Balance training, Gait training, Patient/Family education, Joint mobilization, Stair training, Vestibular training, Visual/preceptual remediation/compensation, Orthotic/Fit training, DME instructions, Aquatic Therapy, Dry Needling, Electrical stimulation, Cryotherapy, Moist heat, Manual lymph drainage, Compression bandaging, Splintting, Taping, Manual therapy, and Re-evaluation   PLAN FOR NEXT SESSION: scifit for endurance and ROM, L weight shifting, ambulatory balance, standing balance/gait on compliant surfaces, fun mat, confidence with mobility!; REVIEW HEP AND MAKE APPROPRIATE CHANGES, ramp   Debbora Dus, PT, DPT Debbora Dus, PT, DPT, CBIS  11/19/2021, 10:32 AM

## 2021-11-23 ENCOUNTER — Ambulatory Visit: Payer: HMO

## 2021-11-23 DIAGNOSIS — R2689 Other abnormalities of gait and mobility: Secondary | ICD-10-CM

## 2021-11-23 DIAGNOSIS — R278 Other lack of coordination: Secondary | ICD-10-CM

## 2021-11-23 DIAGNOSIS — M6281 Muscle weakness (generalized): Secondary | ICD-10-CM

## 2021-11-23 DIAGNOSIS — R2681 Unsteadiness on feet: Secondary | ICD-10-CM

## 2021-11-23 NOTE — Therapy (Signed)
OUTPATIENT PHYSICAL THERAPY TREATMENT NOTE   Patient Name: Porshea Janowski MRN: [id number] DOB:1945-05-17, 76 y.o., female Today's Date: 11/23/2021  PCP: Orpah Melter, MD  REFERRING PROVIDER: Larena Glassman, MD    END OF SESSION:   PT End of Session - 11/23/21 1449     Visit Number 14    Number of Visits 21    Date for PT Re-Evaluation 12/15/21    Authorization Type [employer]    Progress Note Due on Visit 36    PT Start Time 1447    PT Stop Time 1532    PT Time Calculation (min) 45 min    Activity Tolerance Patient tolerated treatment well    Behavior During Therapy WFL for tasks assessed/performed              Past Medical History:  Diagnosis Date   Actinic keratosis    Followed by Dr. Allyson Sabal   Anxiety    Diabetes mellitus, type II, insulin dependent ([place])    Associated with obesity: She takes Lantus, Victoza & Actos   Diplopia    Essential hypertension    GERD (gastroesophageal reflux disease)    Hyperlipidemia    Morbid obesity with BMI of 40.0-44.9, adult ([place])    Stroke ([place]) 2021   Past Surgical History:  Procedure Laterality Date   BREAST BIOPSY  1997   CARPAL TUNNEL RELEASE Right 11/12/2021   Procedure: CARPAL TUNNEL RELEASE;  Surgeon: Mordecai Rasmussen, MD;  Location: [facility];  Service: Orthopedics;  Laterality: Right;  pt knows to arrive at 6:15   LEFT HEART CATH AND CORONARY ANGIOGRAPHY N/A 01/05/2017   Procedure: LEFT HEART CATH AND CORONARY ANGIOGRAPHY;  Surgeon: Leonie Man, MD;  Location: [facility];  Service: Cardiovascular: Angiographically normal coronary arteries with a right dominant system.  Normal EF 55-65%.   LOOP RECORDER INSERTION N/A 08/01/2019   Procedure: LOOP RECORDER INSERTION;  Surgeon: Constance Haw, MD;  Location: [facility];  Service: Cardiovascular;  Laterality: N/A;   Patient Active Problem List   Diagnosis Date Noted   Paroxysmal atrial fibrillation ([place]) 08/08/2019   Secondary  hypercoagulable state ([place]) 08/08/2019   Acute cerebrovascular accident of cerebellum ([place]) 07/31/2019   GERD (gastroesophageal reflux disease)    Leukocytosis    Morbid obesity ([place]) 01/25/2018   Chest pain with low risk for cardiac etiology 12/31/2016   Essential hypertension 12/31/2016   Hyperlipidemia 12/31/2016   DM (diabetes mellitus), type 2 with complications ([place]) 09/73/5329    REFERRING DIAG: J24.268,T41.96 (ICD-10-CM) - Chronic pain of left knee    THERAPY DIAG:  No diagnosis found.  Rationale for Evaluation and Treatment Rehabilitation  PERTINENT HISTORY: DM II, GERD, hyperlipidemia, diplopia, obesity, R Colles fx   PRECAUTIONS: Fall, RUE coffee cup weightbearing   SUBJECTIVE: Patient reports doing well. Denies falls/near falls.   PAIN:  Are you having pain? No    OBJECTIVE:    TODAY'S TREATMENT:  Therex:  -review HEP (added green TB)  -Scifit x5 mins level 2  Gait:  -115' focus on "smooth walking"  -ramp x4 U UE support -> no UE support   PATIENT EDUCATION: Education details: continue HEP Person educated: Patient and Child(ren) Education method: Explanation Education comprehension: verbalized understanding and needs further education     HOME EXERCISE PROGRAM: Access Code: [email or web address] URL: [email or web address] Date: 10/29/2021 Prepared by: Estevan Ryder  Exercises - Seated knee extension   - 1 x daily - 7 x weekly - 3 sets -  10 reps - 5 second hold - Supine Bridge  - 1 x daily - 7 x weekly - 3 sets - 10 reps - Seated Knee Flexion Extension AROM   - 1 x daily - 7 x weekly - 3 sets - 10 reps - Standing March with Counter Support  - 1 x daily - 7 x weekly - 3 sets - 10 reps - Standing Hip Abduction with Counter Support  - 1 x daily - 7 x weekly - 3 sets - 10 reps - Standing Hip Extension with Counter Support  - 1 x daily - 7 x weekly - 3 sets - 10 reps - Side Stepping with Counter Support  - 1 x daily - 7 x weekly - 3 sets - 10 reps -  Backward Walking with Counter Support  - 1 x daily - 7 x weekly - 3 sets - 10 reps - Heel Raises with Counter Support  - 1 x daily - 7 x weekly - 3 sets - 10 reps - Seated Gastroc Stretch with Strap  - 1 x daily - 7 x weekly - 3 sets - 10 reps   GOALS: Goals reviewed with patient? Yes   SHORT TERM GOALS: Target date: 11/03/2021   Pt will be independent with initial HEP for improved strength and balance  Baseline: provided  Goal status: MET   2.  Pt will improve gait speed to >/= .56ms to demonstrate improved community ambulation   Baseline: .132m; .2498m(10/22/21); 0.61 m/s on 8/7  Goal status: MET   3.  Pt will improve FGA to >/= 10/30 to demonstrate improved balance and reduced fall risk   Baseline: 5/30; 11/30 (10/22/21)  Goal status: MET   4.  Patient will improve L knee AROM flexion to at least 90* Baseline: 72*;  117* (7/27)  Goal status: MET   5.  Patient will improve L knee AROM extension to -15* Baseline: -22*; -10* (7/27)  Goal status: MET   LONG TERM GOALS: Target date: 12/15/2021   Pt will be independent with final HEP for improved strength and balance  Baseline: to be provided Goal status: INITIAL   2.  Pt will improve FGA to >/= 20/30 to demonstrate improved balance and reduced fall risk   Baseline: 5/30 Goal status: INITIAL   3.  Pt will improve gait speed to >/= .21m33mo demonstrate improved community ambulation   Baseline: .8m/65mal status: INITIAL   4.  Patient will improve L knee AROM flexion to at least 120* Baseline: 72* Goal status: REVISED   5.  Patient will improve L knee AROM extension to better than -5* Baseline: -22* Goal status: REVISED       ASSESSMENT:   CLINICAL IMPRESSION: Patient seen for skilled PT session with emphasis on revising HEP as needed. Patient tolerating progression of resistance well with no increase in pain or LOB noted. Patient receptive to cue "walk smooth" to even out her gait and minimize trendelenburg  experienced. Patient remains fearful of ramp and would benefit from continued community obstacle negotiation. Continue POC.     OBJECTIVE IMPAIRMENTS Abnormal gait, cardiopulmonary status limiting activity, decreased activity tolerance, decreased balance, decreased coordination, decreased endurance, decreased knowledge of condition, decreased knowledge of use of DME, decreased mobility, difficulty walking, decreased ROM, decreased strength, hypomobility, increased edema, impaired sensation, and obesity.    ACTIVITY LIMITATIONS carrying, lifting, bending, sitting, standing, squatting, stairs, transfers, bed mobility, and locomotion level   PARTICIPATION LIMITATIONS: meal prep, cleaning, laundry, driving,  shopping, community activity, and yard work   PERSONAL FACTORS Age, Behavior pattern, Fitness, Past/current experiences, and 3+ comorbidities: hyperlipidemia, DM II, obesity  are also affecting patient's functional outcome.    REHAB POTENTIAL: Good   CLINICAL DECISION MAKING: Stable/uncomplicated   EVALUATION COMPLEXITY: Low   PLAN: PT FREQUENCY: 2x/week   PT DURATION: 10 weeks   PLANNED INTERVENTIONS: Therapeutic exercises, Therapeutic activity, Neuromuscular re-education, Balance training, Gait training, Patient/Family education, Joint mobilization, Stair training, Vestibular training, Visual/preceptual remediation/compensation, Orthotic/Fit training, DME instructions, Aquatic Therapy, Dry Needling, Electrical stimulation, Cryotherapy, Moist heat, Manual lymph drainage, Compression bandaging, Splintting, Taping, Manual therapy, and Re-evaluation   PLAN FOR NEXT SESSION: scifit for endurance and ROM, L weight shifting, ambulatory balance, standing balance/gait on compliant surfaces, fun mat, confidence with mobility!; community obstacles   Debbora Dus, PT, DPT Debbora Dus, PT, DPT, CBIS  11/23/2021, 3:35 PM

## 2021-11-24 ENCOUNTER — Ambulatory Visit (INDEPENDENT_AMBULATORY_CARE_PROVIDER_SITE_OTHER): Payer: HMO | Admitting: Orthopedic Surgery

## 2021-11-24 ENCOUNTER — Encounter: Payer: Self-pay | Admitting: Orthopedic Surgery

## 2021-11-24 VITALS — Ht 62.0 in | Wt 248.0 lb

## 2021-11-24 DIAGNOSIS — G5601 Carpal tunnel syndrome, right upper limb: Secondary | ICD-10-CM

## 2021-11-24 NOTE — Progress Notes (Signed)
Return Patient Visit  Assessment: [name] is a 76 y.o. female with the following: Closed Colles' fracture of right radius Severe right carpal tunnel syndrome.  Plan: [name] is status post right open carpal tunnel release, 11/12/2021.  She has done well.  She states the burning pain to her fingers has resolved.  However, she continues to have numbness.  She also continues to have stiffness of the fingers in her right hand.  No issues with her incisions.  Some pain in the middle of her palm, but otherwise she is doing well following surgery.  She is continue to work with OT.  I will see her back in 1 month.   Follow-up: Return in about 4 weeks (around 12/22/2021).  Subjective:  Chief Complaint  Patient presents with   Routine Post Op    Rt CTR DOS 11/12/21    History of Present Illness: [name] is a 76 y.o. female who returns for evaluation of right wrist pain.  She underwent surgery for right carpal tunnel syndrome approximately 10 days ago.  She has done well.  She has some pain in her palm, but is otherwise doing well.  She continues to have stiffness of her fingers and her wrist.  She notes that the burning pains into the fingers of her right hand have resolved.  However, she continues to have numbness.   Review of Systems: No fevers or chills + Numbness and tingling. No chest pain No shortness of breath No bowel or bladder dysfunction No GI distress No headaches  Objective: Ht '5\' 2"'$  (1.575 m)   Wt 248 lb (112.5 kg)   BMI 45.36 kg/m   Physical Exam:  General: Alert and oriented., No acute distress., Seated in a wheelchair., and Obese female. Gait: Ambulating with minimal assistance.  Right hand surgical incision is healing well.  No surrounding erythema or drainage.  Tenderness to palpation in the palm.  Decreased sensation in the median nerve distribution.  She is not quite able to make a full fist.  She has improved range of motion of  her right wrist.  Mild swelling of the right hand.   IMAGING: I personally ordered and reviewed the following images  No new imaging obtained today.  New Medications:  No orders of the defined types were placed in this encounter.     Mordecai Rasmussen, MD  11/24/2021 10:22 PM

## 2021-11-24 NOTE — Patient Instructions (Signed)
Continue with therapy for your hand  Ok to wash hand, but do not soak the wounds  Follow up in 4 weeks

## 2021-11-25 ENCOUNTER — Ambulatory Visit: Payer: HMO | Admitting: Occupational Therapy

## 2021-11-26 ENCOUNTER — Ambulatory Visit: Payer: HMO

## 2021-11-27 ENCOUNTER — Ambulatory Visit (INDEPENDENT_AMBULATORY_CARE_PROVIDER_SITE_OTHER): Payer: HMO

## 2021-11-27 DIAGNOSIS — I639 Cerebral infarction, unspecified: Secondary | ICD-10-CM

## 2021-11-28 NOTE — Progress Notes (Signed)
[facility] Summary Report / Loop Recorder 

## 2021-12-01 LAB — CUP PACEART REMOTE DEVICE CHECK
Date Time Interrogation Session: 20230905112258
Implantable Pulse Generator Implant Date: 20210505

## 2021-12-03 ENCOUNTER — Ambulatory Visit: Payer: HMO | Attending: Orthopedic Surgery

## 2021-12-03 DIAGNOSIS — M25531 Pain in right wrist: Secondary | ICD-10-CM | POA: Insufficient documentation

## 2021-12-03 DIAGNOSIS — M25631 Stiffness of right wrist, not elsewhere classified: Secondary | ICD-10-CM | POA: Insufficient documentation

## 2021-12-03 DIAGNOSIS — R2681 Unsteadiness on feet: Secondary | ICD-10-CM | POA: Insufficient documentation

## 2021-12-03 DIAGNOSIS — R600 Localized edema: Secondary | ICD-10-CM | POA: Diagnosis not present

## 2021-12-03 DIAGNOSIS — R2689 Other abnormalities of gait and mobility: Secondary | ICD-10-CM | POA: Diagnosis not present

## 2021-12-03 DIAGNOSIS — M6281 Muscle weakness (generalized): Secondary | ICD-10-CM | POA: Insufficient documentation

## 2021-12-03 DIAGNOSIS — R278 Other lack of coordination: Secondary | ICD-10-CM | POA: Diagnosis not present

## 2021-12-03 DIAGNOSIS — M25641 Stiffness of right hand, not elsewhere classified: Secondary | ICD-10-CM | POA: Insufficient documentation

## 2021-12-03 NOTE — Therapy (Signed)
OUTPATIENT PHYSICAL THERAPY TREATMENT NOTE   Patient Name: [name] MRN: [id number] DOB:02/09/1946, 76 y.o., female Today's Date: 12/03/2021  PCP: Orpah Melter, MD  REFERRING PROVIDER: Larena Glassman, MD    END OF SESSION:   PT End of Session - 12/03/21 1531     Visit Number 15    Number of Visits 21    Date for PT Re-Evaluation 12/15/21    Authorization Type [employer]    Progress Note Due on Visit 66    PT Start Time 1530    PT Stop Time 1612    PT Time Calculation (min) 42 min    Activity Tolerance Patient tolerated treatment well    Behavior During Therapy WFL for tasks assessed/performed              Past Medical History:  Diagnosis Date   Actinic keratosis    Followed by Dr. Allyson Sabal   Anxiety    Diabetes mellitus, type II, insulin dependent ([place])    Associated with obesity: She takes Lantus, Victoza & Actos   Diplopia    Essential hypertension    GERD (gastroesophageal reflux disease)    Hyperlipidemia    Morbid obesity with BMI of 40.0-44.9, adult ([place])    Stroke ([place]) 2021   Past Surgical History:  Procedure Laterality Date   BREAST BIOPSY  1997   CARPAL TUNNEL RELEASE Right 11/12/2021   Procedure: CARPAL TUNNEL RELEASE;  Surgeon: Mordecai Rasmussen, MD;  Location: [facility];  Service: Orthopedics;  Laterality: Right;  pt knows to arrive at 6:15   LEFT HEART CATH AND CORONARY ANGIOGRAPHY N/A 01/05/2017   Procedure: LEFT HEART CATH AND CORONARY ANGIOGRAPHY;  Surgeon: Leonie Man, MD;  Location: [facility];  Service: Cardiovascular: Angiographically normal coronary arteries with a right dominant system.  Normal EF 55-65%.   LOOP RECORDER INSERTION N/A 08/01/2019   Procedure: LOOP RECORDER INSERTION;  Surgeon: Constance Haw, MD;  Location: [facility];  Service: Cardiovascular;  Laterality: N/A;   Patient Active Problem List   Diagnosis Date Noted   Paroxysmal atrial fibrillation ([place]) 08/08/2019   Secondary  hypercoagulable state ([place]) 08/08/2019   Acute cerebrovascular accident of cerebellum ([place]) 07/31/2019   GERD (gastroesophageal reflux disease)    Leukocytosis    Morbid obesity ([place]) 01/25/2018   Chest pain with low risk for cardiac etiology 12/31/2016   Essential hypertension 12/31/2016   Hyperlipidemia 12/31/2016   DM (diabetes mellitus), type 2 with complications ([place]) 35/36/1443    REFERRING DIAG: X54.008,Q76.19 (ICD-10-CM) - Chronic pain of left knee    THERAPY DIAG:  Muscle weakness (generalized)  Other lack of coordination  Unsteadiness on feet  Other abnormalities of gait and mobility  Rationale for Evaluation and Treatment Rehabilitation  PERTINENT HISTORY: DM II, GERD, hyperlipidemia, diplopia, obesity, R Colles fx   PRECAUTIONS: Fall, RUE coffee cup weightbearing   SUBJECTIVE: Patient reports doing well. Denies falls/near falls. Does state that she had some GI upset last week, but feels better.   PAIN:  Are you having pain? No    OBJECTIVE:    TODAY'S TREATMENT:  Therex:  -Scifit x5 mins level 2 -step up and over 4" step with CGA   Gait:  -115' focus on "smooth walking"  -ramp x4 SPC + supervision -115' with no SPC     PATIENT EDUCATION: Education details: continue HEP, gait without SPC Person educated: Patient and Child(ren) Education method: Explanation Education comprehension: verbalized understanding and needs further education  HOME EXERCISE PROGRAM: Access Code: [email or web address] URL: [email or web address] Date: 10/29/2021 Prepared by: Estevan Ryder  Exercises - Seated knee extension   - 1 x daily - 7 x weekly - 3 sets - 10 reps - 5 second hold - Supine Bridge  - 1 x daily - 7 x weekly - 3 sets - 10 reps - Seated Knee Flexion Extension AROM   - 1 x daily - 7 x weekly - 3 sets - 10 reps - Standing March with Counter Support  - 1 x daily - 7 x weekly - 3 sets - 10 reps - Standing Hip Abduction with Counter Support  - 1 x daily  - 7 x weekly - 3 sets - 10 reps - Standing Hip Extension with Counter Support  - 1 x daily - 7 x weekly - 3 sets - 10 reps - Side Stepping with Counter Support  - 1 x daily - 7 x weekly - 3 sets - 10 reps - Backward Walking with Counter Support  - 1 x daily - 7 x weekly - 3 sets - 10 reps - Heel Raises with Counter Support  - 1 x daily - 7 x weekly - 3 sets - 10 reps - Seated Gastroc Stretch with Strap  - 1 x daily - 7 x weekly - 3 sets - 10 reps   GOALS: Goals reviewed with patient? Yes   SHORT TERM GOALS: Target date: 11/03/2021   Pt will be independent with initial HEP for improved strength and balance  Baseline: provided  Goal status: MET   2.  Pt will improve gait speed to >/= .63ms to demonstrate improved community ambulation   Baseline: .179m; .2464m(10/22/21); 0.61 m/s on 8/7  Goal status: MET   3.  Pt will improve FGA to >/= 10/30 to demonstrate improved balance and reduced fall risk   Baseline: 5/30; 11/30 (10/22/21)  Goal status: MET   4.  Patient will improve L knee AROM flexion to at least 90* Baseline: 72*;  117* (7/27)  Goal status: MET   5.  Patient will improve L knee AROM extension to -15* Baseline: -22*; -10* (7/27)  Goal status: MET   LONG TERM GOALS: Target date: 12/15/2021   Pt will be independent with final HEP for improved strength and balance  Baseline: to be provided Goal status: INITIAL   2.  Pt will improve FGA to >/= 20/30 to demonstrate improved balance and reduced fall risk   Baseline: 5/30 Goal status: INITIAL   3.  Pt will improve gait speed to >/= .23m44mo demonstrate improved community ambulation   Baseline: .72m/76mal status: INITIAL   4.  Patient will improve L knee AROM flexion to at least 120* Baseline: 72* Goal status: REVISED   5.  Patient will improve L knee AROM extension to better than -5* Baseline: -22* Goal status: REVISED       ASSESSMENT:   CLINICAL IMPRESSION: Patient seen for skilled PT session with  emphasis on functional LE strengthening and community obstacle negotiation. Patient progressing well with gait without AD and improved confidence with negotiation. Continue POC.     OBJECTIVE IMPAIRMENTS Abnormal gait, cardiopulmonary status limiting activity, decreased activity tolerance, decreased balance, decreased coordination, decreased endurance, decreased knowledge of condition, decreased knowledge of use of DME, decreased mobility, difficulty walking, decreased ROM, decreased strength, hypomobility, increased edema, impaired sensation, and obesity.    ACTIVITY LIMITATIONS carrying, lifting, bending, sitting, standing, squatting, stairs, transfers, bed mobility, and locomotion level  PARTICIPATION LIMITATIONS: meal prep, cleaning, laundry, driving, shopping, community activity, and yard work   PERSONAL FACTORS Age, Behavior pattern, Fitness, Past/current experiences, and 3+ comorbidities: hyperlipidemia, DM II, obesity  are also affecting patient's functional outcome.    REHAB POTENTIAL: Good   CLINICAL DECISION MAKING: Stable/uncomplicated   EVALUATION COMPLEXITY: Low   PLAN: PT FREQUENCY: 2x/week   PT DURATION: 10 weeks   PLANNED INTERVENTIONS: Therapeutic exercises, Therapeutic activity, Neuromuscular re-education, Balance training, Gait training, Patient/Family education, Joint mobilization, Stair training, Vestibular training, Visual/preceptual remediation/compensation, Orthotic/Fit training, DME instructions, Aquatic Therapy, Dry Needling, Electrical stimulation, Cryotherapy, Moist heat, Manual lymph drainage, Compression bandaging, Splintting, Taping, Manual therapy, and Re-evaluation   PLAN FOR NEXT SESSION: scifit for endurance and ROM, L weight shifting, ambulatory balance, standing balance/gait on compliant surfaces, fun mat, confidence with mobility!; community obstacles   Debbora Dus, PT, DPT Debbora Dus, PT, DPT, CBIS  12/03/2021, 4:17 PM

## 2021-12-07 ENCOUNTER — Ambulatory Visit: Payer: HMO

## 2021-12-08 ENCOUNTER — Encounter: Payer: Self-pay | Admitting: Occupational Therapy

## 2021-12-08 ENCOUNTER — Ambulatory Visit: Payer: HMO | Admitting: Occupational Therapy

## 2021-12-08 ENCOUNTER — Ambulatory Visit: Payer: HMO

## 2021-12-08 DIAGNOSIS — M6281 Muscle weakness (generalized): Secondary | ICD-10-CM

## 2021-12-08 DIAGNOSIS — M25641 Stiffness of right hand, not elsewhere classified: Secondary | ICD-10-CM

## 2021-12-08 DIAGNOSIS — R2681 Unsteadiness on feet: Secondary | ICD-10-CM

## 2021-12-08 DIAGNOSIS — M25631 Stiffness of right wrist, not elsewhere classified: Secondary | ICD-10-CM

## 2021-12-08 DIAGNOSIS — R278 Other lack of coordination: Secondary | ICD-10-CM

## 2021-12-08 DIAGNOSIS — R2689 Other abnormalities of gait and mobility: Secondary | ICD-10-CM

## 2021-12-08 DIAGNOSIS — M25531 Pain in right wrist: Secondary | ICD-10-CM

## 2021-12-08 NOTE — Therapy (Signed)
OUTPATIENT OCCUPATIONAL THERAPY ORTHO TREATMENT  Patient Name: [name] MRN: [id number] DOB:08-31-1945, 76 y.o., female Today's Date: 12/08/2021  PCP: Orpah Melter, MD REFERRING PROVIDER: Mordecai Rasmussen, MD    OT End of Session - 12/08/21 1325     Visit Number 2    Number of Visits 16    Date for OT Re-Evaluation 01/19/22    Authorization Type [employer]    Progress Note Due on Visit 10    OT Start Time 1320    OT Stop Time 1405    OT Time Calculation (min) 45 min    Activity Tolerance Patient tolerated treatment well    Behavior During Therapy WFL for tasks assessed/performed              Past Medical History:  Diagnosis Date   Actinic keratosis    Followed by Dr. Allyson Sabal   Anxiety    Diabetes mellitus, type II, insulin dependent ([place])    Associated with obesity: She takes Lantus, Victoza & Actos   Diplopia    Essential hypertension    GERD (gastroesophageal reflux disease)    Hyperlipidemia    Morbid obesity with BMI of 40.0-44.9, adult ([place])    Stroke ([place]) 2021   Past Surgical History:  Procedure Laterality Date   BREAST BIOPSY  1997   CARPAL TUNNEL RELEASE Right 11/12/2021   Procedure: CARPAL TUNNEL RELEASE;  Surgeon: Mordecai Rasmussen, MD;  Location: [facility];  Service: Orthopedics;  Laterality: Right;  pt knows to arrive at 6:15   LEFT HEART CATH AND CORONARY ANGIOGRAPHY N/A 01/05/2017   Procedure: LEFT HEART CATH AND CORONARY ANGIOGRAPHY;  Surgeon: Leonie Man, MD;  Location: [facility];  Service: Cardiovascular: Angiographically normal coronary arteries with a right dominant system.  Normal EF 55-65%.   LOOP RECORDER INSERTION N/A 08/01/2019   Procedure: LOOP RECORDER INSERTION;  Surgeon: Constance Haw, MD;  Location: [facility];  Service: Cardiovascular;  Laterality: N/A;   Patient Active Problem List   Diagnosis Date Noted   Paroxysmal atrial fibrillation ([place]) 08/08/2019   Secondary hypercoagulable  state ([place]) 08/08/2019   Acute cerebrovascular accident of cerebellum ([place]) 07/31/2019   GERD (gastroesophageal reflux disease)    Leukocytosis    Morbid obesity ([place]) 01/25/2018   Chest pain with low risk for cardiac etiology 12/31/2016   Essential hypertension 12/31/2016   Hyperlipidemia 12/31/2016   DM (diabetes mellitus), type 2 with complications ([place]) 03/47/4259    ONSET DATE: NEW referral 11/17/21, 10/20/2021 (referral date)   REFERRING DIAG: Z98.890 (ICD-10-CM) - S/P carpal tunnel release recently on 11/12/21 S52.531D (ICD-10-CM) - Closed Colles' fracture of right radius with routine healing, subsequent encounter (casted for 6 weeks)  **Resume OT following carpal tunnel release surgery   THERAPY DIAG:  Muscle weakness (generalized)  Other lack of coordination  Stiffness of right wrist, not elsewhere classified  Stiffness of right hand, not elsewhere classified  Pain in right wrist  Rationale for Evaluation and Treatment Rehabilitation  SUBJECTIVE:   SUBJECTIVE STATEMENT: The burning has gone, but the numbness is still there.  Son reports stiches were removed on 11/24/21 Pt accompanied by:  son  PERTINENT HISTORY: recent CTR 11/12/21, Rt distal radius fx 08/06/21 (casted 6 weeks), CTS, DM, HTN, A-fib, CVA 2021  PRECAUTIONS: Fall and Other: Loop recorder  WEIGHT BEARING RESTRICTIONS No  PAIN:  Are you having pain? Yes: NPRS scale: 0/10 Pain location: Rt ulnar wrist and all fingertips except small finger  Pain description: achy at wrist, numbness at fingers Aggravating factors: nothing, but worse at night Relieving factors: nothing  FALLS: Has patient fallen in last 6 months? Yes. Number of falls 1  LIVING ENVIRONMENT: Lives with: lives with their family Pt lives in 1 story home w/ basement, 5 steps to enter house Has following equipment at home: Lobbyist, Environmental consultant - 2 wheeled, shower chair, and Grab bars  PLOF: Independent  PATIENT GOALS hold  things in Rt hand better to write and eat, improve movement and strength Rt hand  OBJECTIVE:   HAND DOMINANCE: Right   **BELOW TABLE: first numbers are 10/29/21 eval, second number is 11/19/21 re-eval  Active ROM Right eval Left eval  Shoulder flexion    Shoulder abduction    Shoulder adduction    Shoulder extension    Shoulder internal rotation    Shoulder external rotation    Elbow flexion    Elbow extension    Wrist flexion 10/29/21 = 35,  11/19/21 = 38   Wrist extension 28 25   Wrist ulnar deviation    Wrist radial deviation    Wrist pronation WFLs   Wrist supination 75 75   (Blank rows = not tested)  Active ROM Right eval Left eval  Thumb MCP (0-60)    Thumb IP (0-80)    Thumb Radial abd/add (0-55)     Thumb Palmar abd/add (0-45) 65 70 70   Thumb Opposition to Small Finger     Index MCP (0-90)     Index PIP (0-100) 65 65    Index DIP (0-70)      Long MCP (0-90)      Long PIP (0-100) 70  70    Long DIP (0-70)      Ring MCP (0-90)      Ring PIP (0-100) 72  70    Ring DIP (0-70)      Little MCP (0-90)      Little PIP (0-100) 55 60    Little DIP (0-70)      (Blank rows = not tested)    HAND FUNCTION: **Did NOT reassess grip or pinch strength on 11/19/21 d/t current precautions from CTR  10/29/21: Grip strength: Right: 4.8 lbs; Left: 33.5 lbs, Lateral pinch: Right: 3 lbs, Left: 11 lbs, and 3 point pinch: Right: 1 lbs, Left: 11 lbs   OBSERVATIONS: Pt also w/ arthritis in hands. Tightness more in PIP joints than MP joints of fingers Rt hand. Also noted thumb CMC instability (? From arthritis)    TODAY'S TREATMENT:  Pt almost 4 weeks post-op CTR at this time - pt issued extensive HEP for tendon gliding, A/ROM in wrist flex, ext, forearm supination, and thumb opposition; P/ROM in wrist ext, supination, and composite finger flex, and light putty HEP for finger ROM/ light grip strength (yellow resistance). See pt instructions for details  Pt also instructed to  use Rt dominant hand for light ADLS (eating, writing, picking up non weighted objects or light objects: 1-2 lbs max, and using to assist w/ bathing and dressing).  Pt practiced writing and simulated eating Rt hand - did not need built up handles  Did not begin scar massage today as pt had slight opening of skin where stitches were removed on 11/24/21  PATIENT EDUCATION: Education details: HEP including A/ROM, P/ROM to hand and wrist, light putty  Person educated: Patient and Child(ren) Education method: Explanation, Demonstration, Verbal cues, and Handouts Education comprehension: verbalized understanding, returned demonstration, verbal  cues required, and needs further education   HOME EXERCISE PROGRAM:   12/08/21: HEP including A/ROM, P/ROM to hand and wrist, light putty   GOALS:  SHORT TERM GOALS: Target date: 12/20/21  Independent with HEP for wrist and hand ROM and hand strengthening Baseline: Goal status: IN PROGRESS  2.  Pt to increase Rt wrist flex by 5* or greater and extension by 10* or greater Baseline: flex = 38*, ext = 25* Goal status: IN PROGRESS  3.  Pt to improve PIP flexion digits 2-5 Rt hand by 10* or greater Baseline: 65*, 70*, 72*, 60*  Goal status: IN PROGRESS  4.  Pt to return to using Rt hand as dominant hand for BADLS including writing and eating Baseline: using Rt hand some for eating since CTR Goal status: IN PROGRESS  5.  Pt to oppose Rt thumb to 5th digit  Baseline: 3rd digit only Goal status: IN PROGRESS  6.  Pt to improve Rt grip strength to 8 lbs or greater Baseline: 4 lbs on 10/29/21 Goal status: IN PROGRESS  LONG TERM GOALS: Target date: 01/19/22    Improve coordination Rt hand as evidenced by performing 9 hole peg test in 50 sec or less Baseline: 70.47 sec, 11/19/21 = 63 sec Goal status: Revised  2.  Pt to improve grip strength Rt hand to 15 lbs  Baseline: 4 lbs on 10/29/21 Goal status: INITIAL  3.  Pt to improve lateral and tip pinch Rt  hand by 3 lbs each Baseline: Lat = 3, 3 tip = 1 Goal status: INITIAL  4.  Pt to verbalize understanding of pain reduction strategies for Rt hand and wrist Baseline:  Goal status: INITIAL  5.  Pt to be able to lift and hold 3 lb object Rt hand w/o drops Baseline:  Goal status: INITIAL  ASSESSMENT:  CLINICAL IMPRESSION: Patient almost 4 weeks post-op CTR Rt hand at this time. Pt progressing towards all goals. Remains stiff in fingers but improved at end of session after stretches/ex's  PERFORMANCE DEFICITS in functional skills including ADLs, IADLs, coordination, dexterity, sensation, edema, ROM, strength, pain, flexibility, FMC, GMC, balance, decreased knowledge of use of DME, and UE functional use.   IMPAIRMENTS are limiting patient from ADLs, IADLs, and leisure.   COMORBIDITIES has co-morbidities such as CTS  that affects occupational performance. Patient will benefit from skilled OT to address above impairments and improve overall function.  MODIFICATION OR ASSISTANCE TO COMPLETE EVALUATION: No modification of tasks or assist necessary to complete an evaluation.  OT OCCUPATIONAL PROFILE AND HISTORY: Problem focused assessment: Including review of records relating to presenting problem.  CLINICAL DECISION MAKING: Moderate - several treatment options, min-mod task modification necessary  REHAB POTENTIAL: Fair time since onset, severe CTS  EVALUATION COMPLEXITY: Moderate      PLAN: OT FREQUENCY: 2x/week  OT DURATION: 8 weeks (beginning 11/19/21)   PLANNED INTERVENTIONS: self care/ADL training, therapeutic exercise, therapeutic activity, manual therapy, passive range of motion, functional mobility training, splinting, electrical stimulation, ultrasound, paraffin, fluidotherapy, moist heat, cryotherapy, contrast bath, patient/family education, coping strategies training, DME and/or AE instructions, and Re-evaluation  RECOMMENDED OTHER SERVICES: NONE at this time  CONSULTED  AND AGREED WITH PLAN OF CARE: Patient and family member/caregiver  PLAN FOR NEXT SESSION: ? Fluidotherapy if incision completely closed, assess grip strength and update goal prn, review HEP, forearm gym, coordination (med or large pegs)  Next week: scar massage, gel pad for pm, wrist strenthening, gripper activity   Hans Eden, OT  12/08/2021, 1:25 PM

## 2021-12-08 NOTE — Patient Instructions (Signed)
**  See tendon gliding handout for hands  AROM: Wrist Extension    With right palm down, bend wrist up. Do 5 with hand open, then 5 with hand closed Repeat _10___ times per set. Do __1__ sets per session. Do __3-4__ sessions per day.  WRIST: Flexion Against Gravity    Rest arm and hand on surface, palm up. Raise wrist up. _10__ reps per set, 3-4___ sets per day   Supination (Active)    With elbow held at right angle and kept at side, turn palm upward as far as possible. Hold _5___ seconds. Repeat _10___ times. Do _3-4___ sessions per day.  Opposition (Active)    Touch tip of thumb to nail tip of each finger in turn, making an "O" shape. Repeat __5__ times to each finger. Do __3-4__ sessions per day.   Extension (Passive)    Using other hand, lift hand at wrist as far as possible. Hold _10___ seconds. Repeat __5__ times. Do _3-4___ sessions per day.  Supination (Passive)    Keep elbow bent at right angle and held firmly at side. Use other hand to turn forearm until palm faces upward. Hold _10___ seconds. Repeat __5__ times. Do __3-4__ sessions per day.  MP / PIP / DIP Composite Flexion (Passive Stretch)    Use other hand to bend _all_____ fingers at all three joints. Hold __10__ seconds, then release other hand and try and hold new position for a few seconds. Then straighten fingers all the way Repeat _5___ times. Do __3-4__ sessions per day.   1. Grip Strengthening (Resistive Putty)   Squeeze putty using thumb and all fingers. Repeat _15-20___ times. Do __2__ sessions per day.     Activities to do with Rt hand:   Eat with Rt hand Write Pick up checkers, quarters, rings, or plastic bottle caps and place in container Use Rt hand to assist in washing face, bathing, dressing

## 2021-12-08 NOTE — Therapy (Signed)
OUTPATIENT PHYSICAL THERAPY TREATMENT NOTE   Patient Name: [name] MRN: [id number] DOB:10-08-1945, 76 y.o., female Today's Date: 12/08/2021  PCP: Orpah Melter, MD  REFERRING PROVIDER: Larena Glassman, MD    END OF SESSION:   PT End of Session - 12/08/21 1234     Visit Number 16    Number of Visits 21    Date for PT Re-Evaluation 12/15/21    Authorization Type [employer]    Progress Note Due on Visit 44    PT Start Time 1230    PT Stop Time 1315    PT Time Calculation (min) 45 min    Equipment Utilized During Treatment Gait belt    Activity Tolerance Patient tolerated treatment well    Behavior During Therapy WFL for tasks assessed/performed              Past Medical History:  Diagnosis Date   Actinic keratosis    Followed by Dr. Allyson Sabal   Anxiety    Diabetes mellitus, type II, insulin dependent ([place])    Associated with obesity: She takes Lantus, Victoza & Actos   Diplopia    Essential hypertension    GERD (gastroesophageal reflux disease)    Hyperlipidemia    Morbid obesity with BMI of 40.0-44.9, adult ([place])    Stroke ([place]) 2021   Past Surgical History:  Procedure Laterality Date   BREAST BIOPSY  1997   CARPAL TUNNEL RELEASE Right 11/12/2021   Procedure: CARPAL TUNNEL RELEASE;  Surgeon: Mordecai Rasmussen, MD;  Location: [facility];  Service: Orthopedics;  Laterality: Right;  pt knows to arrive at 6:15   LEFT HEART CATH AND CORONARY ANGIOGRAPHY N/A 01/05/2017   Procedure: LEFT HEART CATH AND CORONARY ANGIOGRAPHY;  Surgeon: Leonie Man, MD;  Location: [facility];  Service: Cardiovascular: Angiographically normal coronary arteries with a right dominant system.  Normal EF 55-65%.   LOOP RECORDER INSERTION N/A 08/01/2019   Procedure: LOOP RECORDER INSERTION;  Surgeon: Constance Haw, MD;  Location: [facility];  Service: Cardiovascular;  Laterality: N/A;   Patient Active Problem List   Diagnosis Date Noted   Paroxysmal atrial  fibrillation ([place]) 08/08/2019   Secondary hypercoagulable state ([place]) 08/08/2019   Acute cerebrovascular accident of cerebellum ([place]) 07/31/2019   GERD (gastroesophageal reflux disease)    Leukocytosis    Morbid obesity ([place]) 01/25/2018   Chest pain with low risk for cardiac etiology 12/31/2016   Essential hypertension 12/31/2016   Hyperlipidemia 12/31/2016   DM (diabetes mellitus), type 2 with complications ([place]) 08/65/7846    REFERRING DIAG: N62.952,W41.32 (ICD-10-CM) - Chronic pain of left knee    THERAPY DIAG:  Muscle weakness (generalized)  Other lack of coordination  Unsteadiness on feet  Other abnormalities of gait and mobility  Rationale for Evaluation and Treatment Rehabilitation  PERTINENT HISTORY: DM II, GERD, hyperlipidemia, diplopia, obesity, R Colles fx   PRECAUTIONS: Fall, RUE coffee cup weightbearing   SUBJECTIVE: Patient reports doing well. Denies falls/near falls. Walks with rollator in the house, but able to go to the bathroom in the middle of the night without AD and no issues.   PAIN:  Are you having pain? No    OBJECTIVE:    TODAY'S TREATMENT:  Gait:  -300' outdoors up/down ramp with SPC and close supervision -attempt 6" curb, unable to do so due to need for circumduction of R LE  -stairs x2 B UE support + CGA -230' no AD, supervision   NMR:  -immediate standing  balance sit <> stand on Airex x5 -immediate standing balance sit <> stand on Airex with red tb to promote hip stabilizer use x5  -scifit hills x10 mins level 3     PATIENT EDUCATION: Education details: continue HEP, gait without SPC Person educated: Patient and Child(ren) Education method: Explanation Education comprehension: verbalized understanding and needs further education     HOME EXERCISE PROGRAM: Access Code: [email or web address] URL: [email or web address] Date: 10/29/2021 Prepared by: Estevan Ryder  Exercises - Seated knee extension   - 1 x daily - 7 x weekly -  3 sets - 10 reps - 5 second hold - Supine Bridge  - 1 x daily - 7 x weekly - 3 sets - 10 reps - Seated Knee Flexion Extension AROM   - 1 x daily - 7 x weekly - 3 sets - 10 reps - Standing March with Counter Support  - 1 x daily - 7 x weekly - 3 sets - 10 reps - Standing Hip Abduction with Counter Support  - 1 x daily - 7 x weekly - 3 sets - 10 reps - Standing Hip Extension with Counter Support  - 1 x daily - 7 x weekly - 3 sets - 10 reps - Side Stepping with Counter Support  - 1 x daily - 7 x weekly - 3 sets - 10 reps - Backward Walking with Counter Support  - 1 x daily - 7 x weekly - 3 sets - 10 reps - Heel Raises with Counter Support  - 1 x daily - 7 x weekly - 3 sets - 10 reps - Seated Gastroc Stretch with Strap  - 1 x daily - 7 x weekly - 3 sets - 10 reps   GOALS: Goals reviewed with patient? Yes   SHORT TERM GOALS: Target date: 11/03/2021   Pt will be independent with initial HEP for improved strength and balance  Baseline: provided  Goal status: MET   2.  Pt will improve gait speed to >/= .4ms to demonstrate improved community ambulation   Baseline: .183m; .2411m(10/22/21); 0.61 m/s on 8/7  Goal status: MET   3.  Pt will improve FGA to >/= 10/30 to demonstrate improved balance and reduced fall risk   Baseline: 5/30; 11/30 (10/22/21)  Goal status: MET   4.  Patient will improve L knee AROM flexion to at least 90* Baseline: 72*;  117* (7/27)  Goal status: MET   5.  Patient will improve L knee AROM extension to -15* Baseline: -22*; -10* (7/27)  Goal status: MET   LONG TERM GOALS: Target date: 12/15/2021   Pt will be independent with final HEP for improved strength and balance  Baseline: to be provided Goal status: INITIAL   2.  Pt will improve FGA to >/= 20/30 to demonstrate improved balance and reduced fall risk   Baseline: 5/30 Goal status: INITIAL   3.  Pt will improve gait speed to >/= .27m74mo demonstrate improved community ambulation   Baseline:  .49m/60mal status: INITIAL   4.  Patient will improve L knee AROM flexion to at least 120* Baseline: 72* Goal status: REVISED   5.  Patient will improve L knee AROM extension to better than -5* Baseline: -22* Goal status: REVISED       ASSESSMENT:   CLINICAL IMPRESSION: Patient seen for skilled PT session with emphasis on functional LE strengthening and community obstacle negotiation. Patient progressing well with uneven surface gait and demonstrating improved confidence with this task. Functional strength  and immediate standing balance improving as well. Continue POC.     OBJECTIVE IMPAIRMENTS Abnormal gait, cardiopulmonary status limiting activity, decreased activity tolerance, decreased balance, decreased coordination, decreased endurance, decreased knowledge of condition, decreased knowledge of use of DME, decreased mobility, difficulty walking, decreased ROM, decreased strength, hypomobility, increased edema, impaired sensation, and obesity.    ACTIVITY LIMITATIONS carrying, lifting, bending, sitting, standing, squatting, stairs, transfers, bed mobility, and locomotion level   PARTICIPATION LIMITATIONS: meal prep, cleaning, laundry, driving, shopping, community activity, and yard work   PERSONAL FACTORS Age, Behavior pattern, Fitness, Past/current experiences, and 3+ comorbidities: hyperlipidemia, DM II, obesity  are also affecting patient's functional outcome.    REHAB POTENTIAL: Good   CLINICAL DECISION MAKING: Stable/uncomplicated   EVALUATION COMPLEXITY: Low   PLAN: PT FREQUENCY: 2x/week   PT DURATION: 10 weeks   PLANNED INTERVENTIONS: Therapeutic exercises, Therapeutic activity, Neuromuscular re-education, Balance training, Gait training, Patient/Family education, Joint mobilization, Stair training, Vestibular training, Visual/preceptual remediation/compensation, Orthotic/Fit training, DME instructions, Aquatic Therapy, Dry Needling, Electrical stimulation,  Cryotherapy, Moist heat, Manual lymph drainage, Compression bandaging, Splintting, Taping, Manual therapy, and Re-evaluation   PLAN FOR NEXT SESSION: scifit for endurance and ROM, L weight shifting, ambulatory balance, standing balance/gait on compliant surfaces, fun mat, confidence with mobility!; community obstacles, plan to d/c 9/18    Debbora Dus, PT, DPT Debbora Dus, PT, DPT, CBIS  12/08/2021, 1:17 PM

## 2021-12-09 ENCOUNTER — Ambulatory Visit: Payer: HMO | Admitting: Occupational Therapy

## 2021-12-09 ENCOUNTER — Encounter: Payer: Self-pay | Admitting: Occupational Therapy

## 2021-12-09 ENCOUNTER — Ambulatory Visit: Payer: HMO | Admitting: Physical Therapy

## 2021-12-09 DIAGNOSIS — M25531 Pain in right wrist: Secondary | ICD-10-CM

## 2021-12-09 DIAGNOSIS — R600 Localized edema: Secondary | ICD-10-CM

## 2021-12-09 DIAGNOSIS — M6281 Muscle weakness (generalized): Secondary | ICD-10-CM

## 2021-12-09 DIAGNOSIS — R2681 Unsteadiness on feet: Secondary | ICD-10-CM

## 2021-12-09 DIAGNOSIS — M25631 Stiffness of right wrist, not elsewhere classified: Secondary | ICD-10-CM

## 2021-12-09 DIAGNOSIS — M25641 Stiffness of right hand, not elsewhere classified: Secondary | ICD-10-CM

## 2021-12-09 DIAGNOSIS — R278 Other lack of coordination: Secondary | ICD-10-CM

## 2021-12-09 NOTE — Therapy (Signed)
OUTPATIENT OCCUPATIONAL THERAPY ORTHO TREATMENT  Patient Name: [name] MRN: [id number] DOB:[birth year], 76 y.o., female Today's Date: 12/09/2021  PCP: Orpah Melter, MD REFERRING PROVIDER: Mordecai Rasmussen, MD    OT End of Session - 12/09/21 1407     Visit Number 3    Number of Visits 16    Date for OT Re-Evaluation 01/19/22    Authorization Type [employer]    Progress Note Due on Visit 10    OT Start Time 1403    OT Stop Time 1445    OT Time Calculation (min) 42 min    Activity Tolerance Patient tolerated treatment well    Behavior During Therapy WFL for tasks assessed/performed              Past Medical History:  Diagnosis Date   Actinic keratosis    Followed by Dr. Allyson Sabal   Anxiety    Diabetes mellitus, type II, insulin dependent ([place])    Associated with obesity: She takes Lantus, Victoza & Actos   Diplopia    Essential hypertension    GERD (gastroesophageal reflux disease)    Hyperlipidemia    Morbid obesity with BMI of 40.0-44.9, adult ([place])    Stroke ([place]) 2021   Past Surgical History:  Procedure Laterality Date   BREAST BIOPSY  1997   CARPAL TUNNEL RELEASE Right 11/12/2021   Procedure: CARPAL TUNNEL RELEASE;  Surgeon: Mordecai Rasmussen, MD;  Location: [facility];  Service: Orthopedics;  Laterality: Right;  pt knows to arrive at 6:15   LEFT HEART CATH AND CORONARY ANGIOGRAPHY N/A 01/05/2017   Procedure: LEFT HEART CATH AND CORONARY ANGIOGRAPHY;  Surgeon: Leonie Man, MD;  Location: [facility];  Service: Cardiovascular: Angiographically normal coronary arteries with a right dominant system.  Normal EF 55-65%.   LOOP RECORDER INSERTION N/A 08/01/2019   Procedure: LOOP RECORDER INSERTION;  Surgeon: Constance Haw, MD;  Location: [facility];  Service: Cardiovascular;  Laterality: N/A;   Patient Active Problem List   Diagnosis Date Noted   Paroxysmal atrial fibrillation ([place]) 08/08/2019   Secondary hypercoagulable  state ([place]) 08/08/2019   Acute cerebrovascular accident of cerebellum ([place]) 07/31/2019   GERD (gastroesophageal reflux disease)    Leukocytosis    Morbid obesity ([place]) 01/25/2018   Chest pain with low risk for cardiac etiology 12/31/2016   Essential hypertension 12/31/2016   Hyperlipidemia 12/31/2016   DM (diabetes mellitus), type 2 with complications ([place]) 14/43/1540    ONSET DATE: NEW referral 11/17/21, 10/20/2021 (referral date)   REFERRING DIAG: Z98.890 (ICD-10-CM) - S/P carpal tunnel release recently on 11/12/21 S52.531D (ICD-10-CM) - Closed Colles' fracture of right radius with routine healing, subsequent encounter (casted for 6 weeks)  **Resume OT following carpal tunnel release surgery   THERAPY DIAG:  Muscle weakness (generalized)  Other lack of coordination  Stiffness of right wrist, not elsewhere classified  Stiffness of right hand, not elsewhere classified  Pain in right wrist  Localized edema  Rationale for Evaluation and Treatment Rehabilitation  SUBJECTIVE:   SUBJECTIVE STATEMENT: The ex's are going well, a little soreness Pt accompanied by:  son  PERTINENT HISTORY: recent CTR 11/12/21, Rt distal radius fx 08/06/21 (casted 6 weeks), CTS, DM, HTN, A-fib, CVA 2021  PRECAUTIONS: Fall and Other: Loop recorder  WEIGHT BEARING RESTRICTIONS No  PAIN:  Are you having pain? Yes: NPRS scale: 0-6/10 Pain location: Rt ulnar wrist and all fingertips except small finger Pain description: achy at wrist, numbness at  fingers Aggravating factors: P/ROM, but worse at night Relieving factors: nothing  FALLS: Has patient fallen in last 6 months? Yes. Number of falls 1  LIVING ENVIRONMENT: Lives with: lives with their family Pt lives in 1 story home w/ basement, 5 steps to enter house Has following equipment at home: Lobbyist, Environmental consultant - 2 wheeled, shower chair, and Grab bars  PLOF: Independent  PATIENT GOALS hold things in Rt hand better to write and eat,  improve movement and strength Rt hand  OBJECTIVE:   HAND DOMINANCE: Right   **BELOW TABLE: first numbers are 10/29/21 eval, second number is 11/19/21 re-eval  Active ROM Right eval Left eval  Shoulder flexion    Shoulder abduction    Shoulder adduction    Shoulder extension    Shoulder internal rotation    Shoulder external rotation    Elbow flexion    Elbow extension    Wrist flexion 10/29/21 = 35,  11/19/21 = 38   Wrist extension 28 25   Wrist ulnar deviation    Wrist radial deviation    Wrist pronation WFLs   Wrist supination 75 75   (Blank rows = not tested)  Active ROM Right eval Left eval  Thumb MCP (0-60)    Thumb IP (0-80)    Thumb Radial abd/add (0-55)     Thumb Palmar abd/add (0-45) 65 70 70   Thumb Opposition to Small Finger     Index MCP (0-90)     Index PIP (0-100) 65 65    Index DIP (0-70)      Long MCP (0-90)      Long PIP (0-100) 70  70    Long DIP (0-70)      Ring MCP (0-90)      Ring PIP (0-100) 72  70    Ring DIP (0-70)      Little MCP (0-90)      Little PIP (0-100) 55 60    Little DIP (0-70)      (Blank rows = not tested)    HAND FUNCTION: **Did NOT reassess grip or pinch strength on 11/19/21 d/t current precautions from CTR  10/29/21: Grip strength: Right: 4.8 lbs; Left: 33.5 lbs, Lateral pinch: Right: 3 lbs, Left: 11 lbs, and 3 point pinch: Right: 1 lbs, Left: 11 lbs   OBSERVATIONS: Pt also w/ arthritis in hands. Tightness more in PIP joints than MP joints of fingers Rt hand. Also noted thumb CMC instability (? From arthritis)    TODAY'S TREATMENT:  Reviewed HEP from 12/08/21 x 5 reps each  Forearm gym x 4 for combined forearm and wrist movement  Pt placing large pegs in pegboard Rt hand for pinch strength, progressed down to medium and then smaller pegs for [facility] w/ mod difficulty smallest pegs  Manipulating dii b/t first 3 fingertips. In hand manipulation w/ 3 checkers for fingertip to/from palm translation   HOME EXERCISE  PROGRAM: 12/08/21: HEP including A/ROM, P/ROM to hand and wrist, light putty   GOALS:  SHORT TERM GOALS: Target date: 12/20/21  Independent with HEP for wrist and hand ROM and hand strengthening Baseline: Goal status: MET  2.  Pt to increase Rt wrist flex by 5* or greater and extension by 10* or greater Baseline: flex = 38*, ext = 25* Goal status: IN PROGRESS  3.  Pt to improve PIP flexion digits 2-5 Rt hand by 10* or greater Baseline: 65*, 70*, 72*, 60*  Goal status: IN PROGRESS  4.  Pt to return to using Rt hand as dominant hand for BADLS including writing and eating Baseline: using Rt hand some for eating since CTR Goal status: IN PROGRESS  5.  Pt to oppose Rt thumb to 5th digit  Baseline: 3rd digit only Goal status: IN PROGRESS (now to 4th digit)   6.  Pt to improve Rt grip strength to 8 lbs or greater Baseline: 4 lbs on 10/29/21 Goal status: IN PROGRESS  LONG TERM GOALS: Target date: 01/19/22    Improve coordination Rt hand as evidenced by performing 9 hole peg test in 50 sec or less Baseline: 70.47 sec, 11/19/21 = 63 sec Goal status: Revised  2.  Pt to improve grip strength Rt hand to 15 lbs  Baseline: 4 lbs on 10/29/21 Goal status: INITIAL  3.  Pt to improve lateral and tip pinch Rt hand by 3 lbs each Baseline: Lat = 3, 3 tip = 1 Goal status: INITIAL  4.  Pt to verbalize understanding of pain reduction strategies for Rt hand and wrist Baseline:  Goal status: INITIAL  5.  Pt to be able to lift and hold 3 lb object Rt hand w/o drops Baseline:  Goal status: INITIAL  ASSESSMENT:  CLINICAL IMPRESSION: Patient almost 4 weeks post-op CTR Rt hand at this time. Pt progressing towards all goals. Remains stiff in fingers but improved at end of session after stretches/ex's  PERFORMANCE DEFICITS in functional skills including ADLs, IADLs, coordination, dexterity, sensation, edema, ROM, strength, pain, flexibility, FMC, GMC, balance, decreased knowledge of use of DME, and  UE functional use.   IMPAIRMENTS are limiting patient from ADLs, IADLs, and leisure.   COMORBIDITIES has co-morbidities such as CTS  that affects occupational performance. Patient will benefit from skilled OT to address above impairments and improve overall function.  MODIFICATION OR ASSISTANCE TO COMPLETE EVALUATION: No modification of tasks or assist necessary to complete an evaluation.  OT OCCUPATIONAL PROFILE AND HISTORY: Problem focused assessment: Including review of records relating to presenting problem.  CLINICAL DECISION MAKING: Moderate - several treatment options, min-mod task modification necessary  REHAB POTENTIAL: Fair time since onset, severe CTS  EVALUATION COMPLEXITY: Moderate      PLAN: OT FREQUENCY: 2x/week  OT DURATION: 8 weeks (beginning 11/19/21)   PLANNED INTERVENTIONS: self care/ADL training, therapeutic exercise, therapeutic activity, manual therapy, passive range of motion, functional mobility training, splinting, electrical stimulation, ultrasound, paraffin, fluidotherapy, moist heat, cryotherapy, contrast bath, patient/family education, coping strategies training, DME and/or AE instructions, and Re-evaluation  RECOMMENDED OTHER SERVICES: NONE at this time  CONSULTED AND AGREED WITH PLAN OF CARE: Patient and family member/caregiver  PLAN FOR NEXT SESSION: ? Fluidotherapy if incision completely closed, assess grip strength and update goal prn, scar massage if incision closed, gel pad for pm, wrist strenthening, gripper activity   Hans Eden, OT 12/09/2021, 2:08 PM

## 2021-12-09 NOTE — Therapy (Signed)
OUTPATIENT PHYSICAL THERAPY TREATMENT NOTE   Patient Name: [name] MRN: [id number] DOB:March 04, 1946, 76 y.o., female Today's Date: 12/09/2021  PCP: Orpah Melter, MD  REFERRING PROVIDER: Larena Glassman, MD    END OF SESSION:   PT End of Session - 12/09/21 1454     Visit Number 17    Number of Visits 21    Date for PT Re-Evaluation 12/15/21    Authorization Type [employer]    Progress Note Due on Visit 24    PT Start Time 1449    PT Stop Time 1530    PT Time Calculation (min) 41 min    Equipment Utilized During Treatment Gait belt    Activity Tolerance Patient tolerated treatment well    Behavior During Therapy WFL for tasks assessed/performed               Past Medical History:  Diagnosis Date   Actinic keratosis    Followed by Dr. Allyson Sabal   Anxiety    Diabetes mellitus, type II, insulin dependent ([place])    Associated with obesity: She takes Lantus, Victoza & Actos   Diplopia    Essential hypertension    GERD (gastroesophageal reflux disease)    Hyperlipidemia    Morbid obesity with BMI of 40.0-44.9, adult ([place])    Stroke ([place]) 2021   Past Surgical History:  Procedure Laterality Date   BREAST BIOPSY  1997   CARPAL TUNNEL RELEASE Right 11/12/2021   Procedure: CARPAL TUNNEL RELEASE;  Surgeon: Mordecai Rasmussen, MD;  Location: [facility];  Service: Orthopedics;  Laterality: Right;  pt knows to arrive at 6:15   LEFT HEART CATH AND CORONARY ANGIOGRAPHY N/A 01/05/2017   Procedure: LEFT HEART CATH AND CORONARY ANGIOGRAPHY;  Surgeon: Leonie Man, MD;  Location: [facility];  Service: Cardiovascular: Angiographically normal coronary arteries with a right dominant system.  Normal EF 55-65%.   LOOP RECORDER INSERTION N/A 08/01/2019   Procedure: LOOP RECORDER INSERTION;  Surgeon: Constance Haw, MD;  Location: [facility];  Service: Cardiovascular;  Laterality: N/A;   Patient Active Problem List   Diagnosis Date Noted   Paroxysmal atrial  fibrillation ([place]) 08/08/2019   Secondary hypercoagulable state ([place]) 08/08/2019   Acute cerebrovascular accident of cerebellum ([place]) 07/31/2019   GERD (gastroesophageal reflux disease)    Leukocytosis    Morbid obesity ([place]) 01/25/2018   Chest pain with low risk for cardiac etiology 12/31/2016   Essential hypertension 12/31/2016   Hyperlipidemia 12/31/2016   DM (diabetes mellitus), type 2 with complications ([place]) 86/76/7209    REFERRING DIAG: O70.962,E36.62 (ICD-10-CM) - Chronic pain of left knee    THERAPY DIAG:  Muscle weakness (generalized)  Unsteadiness on feet  Rationale for Evaluation and Treatment Rehabilitation  PERTINENT HISTORY: DM II, GERD, hyperlipidemia, diplopia, obesity, R Colles fx   PRECAUTIONS: Fall, RUE coffee cup weightbearing   SUBJECTIVE: Patient reports doing well. Still using rollator in home due to fear of dogs knocking her over. Goes to bathroom without AD during night or when the dogs are outside. No new changes   PAIN:  Are you having pain? No    OBJECTIVE:    TODAY'S TREATMENT:  Gait Training:  In // bars for improved step clearance, single leg stability and confidence w/negotiation of curbs:  -4" step-ups w/unilateral UE support, x10 per side. No circumduction noted  -Progressed to 6" step and pt unable to step up w/LLE due to weakness but was able to tap to step without  circumduction. Pt performed x15 taps per side to prime curb training w/cane.   CURB:  Level of Assistance: Modified independence and SBA Assistive device utilized:  SPC w/6-prong tip Curb Comments: Practiced ascending/descending 6" curb x6 w/SBA. Pt initially very hesitant to try but was able to bring RLE up to curb without circumduction. Min cues throughout to remind pt of proper sequence to navigate curb, as pt tends to forget to lead w/LLE on descent. Pt initially requiring SBA due to fear-avoidance behavior but quickly progressed to performing mod I    GAIT: Gait  pattern: step through pattern, decreased arm swing- Right, decreased arm swing- Left, decreased stride length, decreased hip/knee flexion- Right, decreased hip/knee flexion- Left, and wide BOS Distance walked: Various clinic distances  Assistive device utilized:  SPC w/6-prong tip  Level of assistance: Modified independence and SBA Comments: No instability noted throughout session   Ther Ex  SciFit multi-peaks level 8 for 8 minutes using BUE/BLEs for neural priming for reciprocal movement, dynamic cardiovascular conditioning and global strength. "I am going to be sore tomorrow, I feel these hills". RPE of 4/10 following activity     PATIENT EDUCATION: Education details: continue HEP, gait without SPC Person educated: Patient and Child(ren) Education method: Explanation Education comprehension: verbalized understanding and needs further education     HOME EXERCISE PROGRAM: Access Code: [email or web address] URL: [email or web address] Date: 10/29/2021 Prepared by: Estevan Ryder  Exercises - Seated knee extension   - 1 x daily - 7 x weekly - 3 sets - 10 reps - 5 second hold - Supine Bridge  - 1 x daily - 7 x weekly - 3 sets - 10 reps - Seated Knee Flexion Extension AROM   - 1 x daily - 7 x weekly - 3 sets - 10 reps - Standing March with Counter Support  - 1 x daily - 7 x weekly - 3 sets - 10 reps - Standing Hip Abduction with Counter Support  - 1 x daily - 7 x weekly - 3 sets - 10 reps - Standing Hip Extension with Counter Support  - 1 x daily - 7 x weekly - 3 sets - 10 reps - Side Stepping with Counter Support  - 1 x daily - 7 x weekly - 3 sets - 10 reps - Backward Walking with Counter Support  - 1 x daily - 7 x weekly - 3 sets - 10 reps - Heel Raises with Counter Support  - 1 x daily - 7 x weekly - 3 sets - 10 reps - Seated Gastroc Stretch with Strap  - 1 x daily - 7 x weekly - 3 sets - 10 reps   GOALS: Goals reviewed with patient? Yes   SHORT TERM GOALS: Target date:  11/03/2021   Pt will be independent with initial HEP for improved strength and balance  Baseline: provided  Goal status: MET   2.  Pt will improve gait speed to >/= .27ms to demonstrate improved community ambulation   Baseline: .176m; .2462m(10/22/21); 0.61 m/s on 8/7  Goal status: MET   3.  Pt will improve FGA to >/= 10/30 to demonstrate improved balance and reduced fall risk   Baseline: 5/30; 11/30 (10/22/21)  Goal status: MET   4.  Patient will improve L knee AROM flexion to at least 90* Baseline: 72*;  117* (7/27)  Goal status: MET   5.  Patient will improve L knee AROM extension to -15* Baseline: -22*; -10* (7/27)  Goal status: MET  LONG TERM GOALS: Target date: 12/15/2021   Pt will be independent with final HEP for improved strength and balance  Baseline: to be provided Goal status: INITIAL   2.  Pt will improve FGA to >/= 20/30 to demonstrate improved balance and reduced fall risk   Baseline: 5/30 Goal status: INITIAL   3.  Pt will improve gait speed to >/= .21ms to demonstrate improved community ambulation   Baseline: .181m Goal status: INITIAL   4.  Patient will improve L knee AROM flexion to at least 120* Baseline: 72* Goal status: REVISED   5.  Patient will improve L knee AROM extension to better than -5* Baseline: -22* Goal status: REVISED       ASSESSMENT:   CLINICAL IMPRESSION: Emphasis of skilled PT session on global strengthening, improved confidence w/gait and curb navigation. Pt initially very hesitant to attempt new tasks but with practice, pt becomes confident in her ability. Pt able to ascend/descend 6" curb x3 today at mod I level w/use of cane. Continue POC.     OBJECTIVE IMPAIRMENTS Abnormal gait, cardiopulmonary status limiting activity, decreased activity tolerance, decreased balance, decreased coordination, decreased endurance, decreased knowledge of condition, decreased knowledge of use of DME, decreased mobility, difficulty  walking, decreased ROM, decreased strength, hypomobility, increased edema, impaired sensation, and obesity.    ACTIVITY LIMITATIONS carrying, lifting, bending, sitting, standing, squatting, stairs, transfers, bed mobility, and locomotion level   PARTICIPATION LIMITATIONS: meal prep, cleaning, laundry, driving, shopping, community activity, and yard work   PERSONAL FACTORS Age, Behavior pattern, Fitness, Past/current experiences, and 3+ comorbidities: hyperlipidemia, DM II, obesity  are also affecting patient's functional outcome.    REHAB POTENTIAL: Good   CLINICAL DECISION MAKING: Stable/uncomplicated   EVALUATION COMPLEXITY: Low   PLAN: PT FREQUENCY: 2x/week   PT DURATION: 10 weeks   PLANNED INTERVENTIONS: Therapeutic exercises, Therapeutic activity, Neuromuscular re-education, Balance training, Gait training, Patient/Family education, Joint mobilization, Stair training, Vestibular training, Visual/preceptual remediation/compensation, Orthotic/Fit training, DME instructions, Aquatic Therapy, Dry Needling, Electrical stimulation, Cryotherapy, Moist heat, Manual lymph drainage, Compression bandaging, Splintting, Taping, Manual therapy, and Re-evaluation   PLAN FOR NEXT SESSION: Goals and DC    E , PT, DPT 12/09/2021, 4:32 PM

## 2021-12-10 ENCOUNTER — Ambulatory Visit: Payer: HMO

## 2021-12-14 ENCOUNTER — Ambulatory Visit: Payer: HMO | Admitting: Occupational Therapy

## 2021-12-14 ENCOUNTER — Encounter: Payer: Self-pay | Admitting: Occupational Therapy

## 2021-12-14 ENCOUNTER — Ambulatory Visit: Payer: HMO

## 2021-12-14 DIAGNOSIS — F411 Generalized anxiety disorder: Secondary | ICD-10-CM | POA: Diagnosis not present

## 2021-12-14 DIAGNOSIS — M810 Age-related osteoporosis without current pathological fracture: Secondary | ICD-10-CM | POA: Diagnosis not present

## 2021-12-14 DIAGNOSIS — M25641 Stiffness of right hand, not elsewhere classified: Secondary | ICD-10-CM

## 2021-12-14 DIAGNOSIS — E782 Mixed hyperlipidemia: Secondary | ICD-10-CM | POA: Diagnosis not present

## 2021-12-14 DIAGNOSIS — N1831 Chronic kidney disease, stage 3a: Secondary | ICD-10-CM | POA: Diagnosis not present

## 2021-12-14 DIAGNOSIS — E1142 Type 2 diabetes mellitus with diabetic polyneuropathy: Secondary | ICD-10-CM | POA: Diagnosis not present

## 2021-12-14 DIAGNOSIS — I6930 Unspecified sequelae of cerebral infarction: Secondary | ICD-10-CM | POA: Diagnosis not present

## 2021-12-14 DIAGNOSIS — R2689 Other abnormalities of gait and mobility: Secondary | ICD-10-CM

## 2021-12-14 DIAGNOSIS — M6281 Muscle weakness (generalized): Secondary | ICD-10-CM

## 2021-12-14 DIAGNOSIS — M25531 Pain in right wrist: Secondary | ICD-10-CM

## 2021-12-14 DIAGNOSIS — I480 Paroxysmal atrial fibrillation: Secondary | ICD-10-CM | POA: Diagnosis not present

## 2021-12-14 DIAGNOSIS — M25631 Stiffness of right wrist, not elsewhere classified: Secondary | ICD-10-CM

## 2021-12-14 DIAGNOSIS — D6869 Other thrombophilia: Secondary | ICD-10-CM | POA: Diagnosis not present

## 2021-12-14 DIAGNOSIS — Z0000 Encounter for general adult medical examination without abnormal findings: Secondary | ICD-10-CM | POA: Diagnosis not present

## 2021-12-14 DIAGNOSIS — R278 Other lack of coordination: Secondary | ICD-10-CM

## 2021-12-14 DIAGNOSIS — I10 Essential (primary) hypertension: Secondary | ICD-10-CM | POA: Diagnosis not present

## 2021-12-14 DIAGNOSIS — Z23 Encounter for immunization: Secondary | ICD-10-CM | POA: Diagnosis not present

## 2021-12-14 DIAGNOSIS — R2681 Unsteadiness on feet: Secondary | ICD-10-CM

## 2021-12-14 NOTE — Therapy (Signed)
OUTPATIENT OCCUPATIONAL THERAPY ORTHO TREATMENT  Patient Name: [name] MRN: [id number] DOB:[birth year], 76 y.o., female Today's Date: 12/14/2021  PCP: Orpah Melter, MD REFERRING PROVIDER: Mordecai Rasmussen, MD    OT End of Session - 12/14/21 1415     Visit Number 4    Number of Visits 16    Date for OT Re-Evaluation 01/19/22    Authorization Type [employer]    Progress Note Due on Visit 10    OT Start Time 1405    OT Stop Time 1448    OT Time Calculation (min) 43 min    Activity Tolerance Patient tolerated treatment well    Behavior During Therapy WFL for tasks assessed/performed              Past Medical History:  Diagnosis Date   Actinic keratosis    Followed by Dr. Allyson Sabal   Anxiety    Diabetes mellitus, type II, insulin dependent ([place])    Associated with obesity: She takes Lantus, Victoza & Actos   Diplopia    Essential hypertension    GERD (gastroesophageal reflux disease)    Hyperlipidemia    Morbid obesity with BMI of 40.0-44.9, adult ([place])    Stroke ([place]) 2021   Past Surgical History:  Procedure Laterality Date   BREAST BIOPSY  1997   CARPAL TUNNEL RELEASE Right 11/12/2021   Procedure: CARPAL TUNNEL RELEASE;  Surgeon: Mordecai Rasmussen, MD;  Location: [facility];  Service: Orthopedics;  Laterality: Right;  pt knows to arrive at 6:15   LEFT HEART CATH AND CORONARY ANGIOGRAPHY N/A 01/05/2017   Procedure: LEFT HEART CATH AND CORONARY ANGIOGRAPHY;  Surgeon: Leonie Man, MD;  Location: [facility];  Service: Cardiovascular: Angiographically normal coronary arteries with a right dominant system.  Normal EF 55-65%.   LOOP RECORDER INSERTION N/A 08/01/2019   Procedure: LOOP RECORDER INSERTION;  Surgeon: Constance Haw, MD;  Location: [facility];  Service: Cardiovascular;  Laterality: N/A;   Patient Active Problem List   Diagnosis Date Noted   Paroxysmal atrial fibrillation ([place]) 08/08/2019   Secondary hypercoagulable  state ([place]) 08/08/2019   Acute cerebrovascular accident of cerebellum ([place]) 07/31/2019   GERD (gastroesophageal reflux disease)    Leukocytosis    Morbid obesity ([place]) 01/25/2018   Chest pain with low risk for cardiac etiology 12/31/2016   Essential hypertension 12/31/2016   Hyperlipidemia 12/31/2016   DM (diabetes mellitus), type 2 with complications ([place]) 57/84/6962    ONSET DATE: NEW referral 11/17/21, 10/20/2021 (referral date)   REFERRING DIAG: Z98.890 (ICD-10-CM) - S/P carpal tunnel release recently on 11/12/21 S52.531D (ICD-10-CM) - Closed Colles' fracture of right radius with routine healing, subsequent encounter (casted for 6 weeks)  **Resume OT following carpal tunnel release surgery   THERAPY DIAG:  Stiffness of right hand, not elsewhere classified  Pain in right wrist  Stiffness of right wrist, not elsewhere classified  Muscle weakness (generalized)  Rationale for Evaluation and Treatment Rehabilitation  SUBJECTIVE:   SUBJECTIVE STATEMENT: It's so hard in my palm  Pt accompanied by:  son  PERTINENT HISTORY: recent CTR 11/12/21, Rt distal radius fx 08/06/21 (casted 6 weeks), CTS, DM, HTN, A-fib, CVA 2021  PRECAUTIONS: Fall and Other: Loop recorder  WEIGHT BEARING RESTRICTIONS No  PAIN:  Are you having pain? Yes: NPRS scale: 0-3/10 Pain location: Rt ulnar wrist and all fingertips except small finger Pain description: achy at wrist, numbness at fingers Aggravating factors: P/ROM, but worse at night Relieving  factors: nothing  FALLS: Has patient fallen in last 6 months? Yes. Number of falls 1  LIVING ENVIRONMENT: Lives with: lives with their family Pt lives in 1 story home w/ basement, 5 steps to enter house Has following equipment at home: Lobbyist, Environmental consultant - 2 wheeled, shower chair, and Grab bars  PLOF: Independent  PATIENT GOALS hold things in Rt hand better to write and eat, improve movement and strength Rt hand  OBJECTIVE:   HAND  DOMINANCE: Right   **BELOW TABLE: first numbers are 10/29/21 eval, second number is 11/19/21 re-eval  Active ROM Right eval Left eval  Shoulder flexion    Shoulder abduction    Shoulder adduction    Shoulder extension    Shoulder internal rotation    Shoulder external rotation    Elbow flexion    Elbow extension    Wrist flexion 10/29/21 = 35,  11/19/21 = 38   Wrist extension 28 25   Wrist ulnar deviation    Wrist radial deviation    Wrist pronation WFLs   Wrist supination 75 75   (Blank rows = not tested)  Active ROM Right eval Left eval  Thumb MCP (0-60)    Thumb IP (0-80)    Thumb Radial abd/add (0-55)     Thumb Palmar abd/add (0-45) 65 70 70   Thumb Opposition to Small Finger     Index MCP (0-90)     Index PIP (0-100) 65 65    Index DIP (0-70)      Long MCP (0-90)      Long PIP (0-100) 70  70    Long DIP (0-70)      Ring MCP (0-90)      Ring PIP (0-100) 72  70    Ring DIP (0-70)      Little MCP (0-90)      Little PIP (0-100) 55 60    Little DIP (0-70)      (Blank rows = not tested)    HAND FUNCTION: **Did NOT reassess grip or pinch strength on 11/19/21 d/t current precautions from CTR  12/14/21: Grip Rt = 11.4 lbs, Lt = 39.4 lbs 10/29/21: Grip strength: Right: 4.8 lbs; Left: 33.5 lbs, Lateral pinch: Right: 3 lbs, Left: 11 lbs, and 3 point pinch: Right: 1 lbs, Left: 11 lbs   OBSERVATIONS: Pt also w/ arthritis in hands. Tightness more in PIP joints than MP joints of fingers Rt hand. Also noted thumb CMC instability (? From arthritis)    TODAY'S TREATMENT:  Fluidotherapy x 10 minutes Rt hand at beginning of session for pain/stiffness and desensitization (incision now completely healed)   Grip strength: Rt = 11.4 lbs, Lt = 39.4 lbs  Ultrasound x 6 min. to palm and incision area d/t scar tissue/tightness at 20% pulsed, 3 Mhz, 0.8 wts/cm2   Pt issued gel pad for night time and instructed pt/son on how to use properly for scar management.  Pt/son also  instructed in scar massage and demo x 5 minutes   Pt issued wrist strengthening HEP w/ 1 lb weight - see pt instructions for details    HOME EXERCISE PROGRAM: 12/08/21: HEP including A/ROM, P/ROM to hand and wrist, light putty    12/14/21: scar massage, gel pad wear and care, wrist strengthening HEP   GOALS:  SHORT TERM GOALS: Target date: 12/20/21  Independent with HEP for wrist and hand ROM and hand strengthening Baseline: Goal status: MET  2.  Pt to increase Rt wrist  flex by 5* or greater and extension by 10* or greater Baseline: flex = 38*, ext = 25* Goal status: IN PROGRESS  3.  Pt to improve PIP flexion digits 2-5 Rt hand by 10* or greater Baseline: 65*, 70*, 72*, 60*  Goal status: IN PROGRESS  4.  Pt to return to using Rt hand as dominant hand for BADLS including writing and eating Baseline: using Rt hand some for eating since CTR Goal status: IN PROGRESS  5.  Pt to oppose Rt thumb to 5th digit  Baseline: 3rd digit only Goal status: IN PROGRESS (now to 4th digit)   6.  Pt to improve Rt grip strength to 8 lbs or greater Baseline: 4 lbs on 10/29/21 Goal status: MET (11.4 LBS)   LONG TERM GOALS: Target date: 01/19/22    Improve coordination Rt hand as evidenced by performing 9 hole peg test in 50 sec or less Baseline: 70.47 sec, 11/19/21 = 63 sec Goal status: Revised  2.  Pt to improve grip strength Rt hand to 15 lbs  Baseline: 4 lbs on 10/29/21 Goal status: INITIAL  3.  Pt to improve lateral and tip pinch Rt hand by 3 lbs each Baseline: Lat = 3, 3 tip = 1 Goal status: INITIAL  4.  Pt to verbalize understanding of pain reduction strategies for Rt hand and wrist Baseline:  Goal status: INITIAL  5.  Pt to be able to lift and hold 3 lb object Rt hand w/o drops Baseline:  Goal status: INITIAL  ASSESSMENT:  CLINICAL IMPRESSION: Patient progressing per protocol. Pt limited in finger ROM  PERFORMANCE DEFICITS in functional skills including ADLs, IADLs,  coordination, dexterity, sensation, edema, ROM, strength, pain, flexibility, FMC, GMC, balance, decreased knowledge of use of DME, and UE functional use.   IMPAIRMENTS are limiting patient from ADLs, IADLs, and leisure.   COMORBIDITIES has co-morbidities such as CTS  that affects occupational performance. Patient will benefit from skilled OT to address above impairments and improve overall function.  MODIFICATION OR ASSISTANCE TO COMPLETE EVALUATION: No modification of tasks or assist necessary to complete an evaluation.  OT OCCUPATIONAL PROFILE AND HISTORY: Problem focused assessment: Including review of records relating to presenting problem.  CLINICAL DECISION MAKING: Moderate - several treatment options, min-mod task modification necessary  REHAB POTENTIAL: Fair time since onset, severe CTS  EVALUATION COMPLEXITY: Moderate      PLAN: OT FREQUENCY: 2x/week  OT DURATION: 8 weeks (beginning 11/19/21)   PLANNED INTERVENTIONS: self care/ADL training, therapeutic exercise, therapeutic activity, manual therapy, passive range of motion, functional mobility training, splinting, electrical stimulation, ultrasound, paraffin, fluidotherapy, moist heat, cryotherapy, contrast bath, patient/family education, coping strategies training, DME and/or AE instructions, and Re-evaluation  RECOMMENDED OTHER SERVICES: NONE at this time  CONSULTED AND AGREED WITH PLAN OF CARE: Patient and family member/caregiver  PLAN FOR NEXT SESSION: continue pulsed [place] (Following session: fluidotherapy), review HEP, continue to work on finger and wrist ROM and strengthening, gripper and clothespins if time allows   Hans Eden, OT 12/14/2021, 2:59 PM

## 2021-12-14 NOTE — Therapy (Signed)
OUTPATIENT PHYSICAL THERAPY TREATMENT NOTE   Patient Name: [name] MRN: [id number] DOB:11-23-1945, 76 y.o., female Today's Date: 12/14/2021  PCP: Orpah Melter, MD  REFERRING PROVIDER: Larena Glassman, MD    END OF SESSION:   PT End of Session - 12/14/21 1451     Visit Number 18    Number of Visits 21    Date for PT Re-Evaluation 12/15/21    Authorization Type [employer]    Progress Note Due on Visit 1    PT Start Time 1450   received from OT   PT Stop Time 1513   discharge   PT Time Calculation (min) 23 min    Activity Tolerance Patient tolerated treatment well    Behavior During Therapy [facility] for tasks assessed/performed               Past Medical History:  Diagnosis Date   Actinic keratosis    Followed by Dr. Allyson Sabal   Anxiety    Diabetes mellitus, type II, insulin dependent ([place])    Associated with obesity: She takes Lantus, Victoza & Actos   Diplopia    Essential hypertension    GERD (gastroesophageal reflux disease)    Hyperlipidemia    Morbid obesity with BMI of 40.0-44.9, adult ([place])    Stroke ([place]) 2021   Past Surgical History:  Procedure Laterality Date   BREAST BIOPSY  1997   CARPAL TUNNEL RELEASE Right 11/12/2021   Procedure: CARPAL TUNNEL RELEASE;  Surgeon: Mordecai Rasmussen, MD;  Location: [facility];  Service: Orthopedics;  Laterality: Right;  pt knows to arrive at 6:15   LEFT HEART CATH AND CORONARY ANGIOGRAPHY N/A 01/05/2017   Procedure: LEFT HEART CATH AND CORONARY ANGIOGRAPHY;  Surgeon: Leonie Man, MD;  Location: [facility];  Service: Cardiovascular: Angiographically normal coronary arteries with a right dominant system.  Normal EF 55-65%.   LOOP RECORDER INSERTION N/A 08/01/2019   Procedure: LOOP RECORDER INSERTION;  Surgeon: Constance Haw, MD;  Location: [facility];  Service: Cardiovascular;  Laterality: N/A;   Patient Active Problem List   Diagnosis Date Noted   Paroxysmal atrial fibrillation ([place])  08/08/2019   Secondary hypercoagulable state ([place]) 08/08/2019   Acute cerebrovascular accident of cerebellum ([place]) 07/31/2019   GERD (gastroesophageal reflux disease)    Leukocytosis    Morbid obesity ([place]) 01/25/2018   Chest pain with low risk for cardiac etiology 12/31/2016   Essential hypertension 12/31/2016   Hyperlipidemia 12/31/2016   DM (diabetes mellitus), type 2 with complications ([place]) 98/33/8250    REFERRING DIAG: N39.767,H41.93 (ICD-10-CM) - Chronic pain of left knee    THERAPY DIAG:  Muscle weakness (generalized)  Unsteadiness on feet  Other lack of coordination  Other abnormalities of gait and mobility  Rationale for Evaluation and Treatment Rehabilitation  PERTINENT HISTORY: DM II, GERD, hyperlipidemia, diplopia, obesity, R Colles fx   PRECAUTIONS: Fall, RUE coffee cup weightbearing   SUBJECTIVE: Patient reports doing well. Using rollator in the house, using [facility] anytime she's outside the house. Denies falls/nears falls.   PAIN:  Are you having pain? No    OBJECTIVE:    TODAY'S TREATMENT:  Goal assessment:   [facility] PT Assessment - 12/14/21 0001       Standardized Balance Assessment   10 Meter Walk .79ms      Functional Gait  Assessment   Gait assessed  Yes    Gait Level Surface Walks 20 ft in less than 7 sec but greater than 5.5  sec, uses assistive device, slower speed, mild gait deviations, or deviates 6-10 in outside of the 12 in walkway width.    Change in Gait Speed Able to change speed, demonstrates mild gait deviations, deviates 6-10 in outside of the 12 in walkway width, or no gait deviations, unable to achieve a major change in velocity, or uses a change in velocity, or uses an assistive device.    Gait with Horizontal Head Turns Performs head turns smoothly with slight change in gait velocity (eg, minor disruption to smooth gait path), deviates 6-10 in outside 12 in walkway width, or uses an assistive device.    Gait with Vertical Head Turns  Performs task with slight change in gait velocity (eg, minor disruption to smooth gait path), deviates 6 - 10 in outside 12 in walkway width or uses assistive device    Gait and Pivot Turn Pivot turns safely in greater than 3 sec and stops with no loss of balance, or pivot turns safely within 3 sec and stops with mild imbalance, requires small steps to catch balance.    Step Over Obstacle Is able to step over one shoe box (4.5 in total height) but must slow down and adjust steps to clear box safely. May require verbal cueing.    Gait with Narrow Base of Support Ambulates 7-9 steps.    Gait with Eyes Closed Walks 20 ft, uses assistive device, slower speed, mild gait deviations, deviates 6-10 in outside 12 in walkway width. Ambulates 20 ft in less than 9 sec but greater than 7 sec.    Ambulating Backwards Walks 20 ft, uses assistive device, slower speed, mild gait deviations, deviates 6-10 in outside 12 in walkway width.    Steps Two feet to a stair, must use rail.    Total Score 18               PATIENT EDUCATION: Education details: PT POC, outcome measure results, continue HEP Person educated: Patient and Child(ren) Education method: Explanation Education comprehension: verbalized understanding and needs further education     HOME EXERCISE PROGRAM: Access Code: [email or web address] URL: [email or web address] Date: 10/29/2021 Prepared by: Estevan Ryder  Exercises - Seated knee extension   - 1 x daily - 7 x weekly - 3 sets - 10 reps - 5 second hold - Supine Bridge  - 1 x daily - 7 x weekly - 3 sets - 10 reps - Seated Knee Flexion Extension AROM   - 1 x daily - 7 x weekly - 3 sets - 10 reps - Standing March with Counter Support  - 1 x daily - 7 x weekly - 3 sets - 10 reps - Standing Hip Abduction with Counter Support  - 1 x daily - 7 x weekly - 3 sets - 10 reps - Standing Hip Extension with Counter Support  - 1 x daily - 7 x weekly - 3 sets - 10 reps - Side Stepping with Counter  Support  - 1 x daily - 7 x weekly - 3 sets - 10 reps - Backward Walking with Counter Support  - 1 x daily - 7 x weekly - 3 sets - 10 reps - Heel Raises with Counter Support  - 1 x daily - 7 x weekly - 3 sets - 10 reps - Seated Gastroc Stretch with Strap  - 1 x daily - 7 x weekly - 3 sets - 10 reps   GOALS: Goals reviewed with patient? Yes   SHORT TERM GOALS: Target date:  11/03/2021   Pt will be independent with initial HEP for improved strength and balance  Baseline: provided  Goal status: MET   2.  Pt will improve gait speed to >/= .74ms to demonstrate improved community ambulation   Baseline: .159m; .2435m(10/22/21); 0.61 m/s on 8/7  Goal status: MET   3.  Pt will improve FGA to >/= 10/30 to demonstrate improved balance and reduced fall risk   Baseline: 5/30; 11/30 (10/22/21) Goal status: MET    4.  Patient will improve L knee AROM flexion to at least 90* Baseline: 72*;  117* (7/27)  Goal status: MET   5.  Patient will improve L knee AROM extension to -15* Baseline: -22*; -10* (7/27)  Goal status: MET   LONG TERM GOALS: Target date: 12/15/2021   Pt will be independent with final HEP for improved strength and balance  Baseline: to be provided; provided Goal status: MET   2.  Pt will improve FGA to >/= 20/30 to demonstrate improved balance and reduced fall risk   Baseline: 5/30; 18/30 Goal status: PARTIALLY MET   3.  Pt will improve gait speed to >/= .57m53mo demonstrate improved community ambulation   Baseline: .69m/47m69m/s16mal status: MET   4.  Patient will improve L knee AROM flexion to at least 120* Baseline: 72*; 120*  Goal status: MET   5.  Patient will improve L knee AROM extension to better than -5* Baseline: -22*; -4* Goal status: MET       ASSESSMENT:   CLINICAL IMPRESSION: Patient seen for skilled PT session with emphasis on goal assessment and discharge. She met 4/5 LTG and partially met the remaining goal. Patient demonstrates increased  fall risk as noted by score of 18/30 on  Functional Gait Assessment.   <22/30 = predictive of falls, <20/30 = fall in 6 months, <18/30 = predictive of falls in PD MCID: 5 points stroke population, 4 points geriatric population (ANPTA Core Set of Outcome Measures for Adults with Neurologic Conditions, 2018). Her ROM has improved to functional limits in her L knee with no increase in pain. 10 Meter Walk Test: Patient instructed to walk 10 meters (32.8 ft) as quickly and as safely as possible at their normal speed x2 and at a fast speed x2. Time measured from 2 meter mark to 8 meter mark to accommodate ramp-up and ramp-down.  Normal speed: .69m/s 52m SPC Cut off scores: <0.4 m/s = household Ambulator, 0.4-0.8 m/s = limited community Ambulator, >0.8 m/s = community Ambulator, >1.2 m/s = crossing a street, <1.0 = increased fall risk MCID 0.05 m/s (small), 0.13 m/s (moderate), 0.06 m/s (significant)  (ANPTA Core Set of Outcome Measures for Adults with Neurologic Conditions, 2018). Patient to discharge from PT at this time.    OBJECTIVE IMPAIRMENTS Abnormal gait, cardiopulmonary status limiting activity, decreased activity tolerance, decreased balance, decreased coordination, decreased endurance, decreased knowledge of condition, decreased knowledge of use of DME, decreased mobility, difficulty walking, decreased ROM, decreased strength, hypomobility, increased edema, impaired sensation, and obesity.    ACTIVITY LIMITATIONS carrying, lifting, bending, sitting, standing, squatting, stairs, transfers, bed mobility, and locomotion level   PARTICIPATION LIMITATIONS: meal prep, cleaning, laundry, driving, shopping, community activity, and yard work   PERSONAL FACTORS Age, Behavior pattern, Fitness, Past/current experiences, and 3+ comorbidities: hyperlipidemia, DM II, obesity  are also affecting patient's functional outcome.    REHAB POTENTIAL: Good   CLINICAL DECISION MAKING: Stable/uncomplicated    EVALUATION COMPLEXITY: Low   PLAN: PT FREQUENCY:  2x/week   PT DURATION: 10 weeks   PLANNED INTERVENTIONS: Therapeutic exercises, Therapeutic activity, Neuromuscular re-education, Balance training, Gait training, Patient/Family education, Joint mobilization, Stair training, Vestibular training, Visual/preceptual remediation/compensation, Orthotic/Fit training, DME instructions, Aquatic Therapy, Dry Needling, Electrical stimulation, Cryotherapy, Moist heat, Manual lymph drainage, Compression bandaging, Splintting, Taping, Manual therapy, and Re-evaluation   PLAN FOR NEXT SESSION: dc from PT  Debbora Dus, PT, DPT Debbora Dus, PT, DPT, CBIS  12/14/2021, 3:22 PM

## 2021-12-14 NOTE — Patient Instructions (Signed)
SCAR MASSAGE 2x/day for 5 min. Deep circles along incision and in palm of hand using cocoa butter or Vitamin E cream  Wrist Extension: Resisted    With right palm down, __1__ pound weight in hand, bend wrist up. Return slowly. Repeat _10___ times per set.  Do __2__ sessions per day.   Wrist Flexion: Resisted    With right palm up, __1__ pound weight in hand, bend wrist up. Return slowly. Repeat __10__ times per set.  Do __2__ sessions per day.

## 2021-12-16 ENCOUNTER — Encounter (HOSPITAL_COMMUNITY): Payer: Self-pay | Admitting: Physician Assistant

## 2021-12-16 ENCOUNTER — Encounter: Payer: Self-pay | Admitting: Occupational Therapy

## 2021-12-16 ENCOUNTER — Ambulatory Visit: Payer: HMO | Admitting: Occupational Therapy

## 2021-12-16 ENCOUNTER — Ambulatory Visit (HOSPITAL_COMMUNITY)
Admission: RE | Admit: 2021-12-16 | Discharge: 2021-12-16 | Disposition: A | Discharge status: Home / Self Care | Payer: HMO | Source: Ambulatory Visit | Attending: Physician Assistant | Admitting: Physician Assistant

## 2021-12-16 VITALS — BP 158/76 | HR 63 | Ht 62.0 in | Wt 237.2 lb

## 2021-12-16 DIAGNOSIS — M25641 Stiffness of right hand, not elsewhere classified: Secondary | ICD-10-CM

## 2021-12-16 DIAGNOSIS — M25631 Stiffness of right wrist, not elsewhere classified: Secondary | ICD-10-CM

## 2021-12-16 DIAGNOSIS — E785 Hyperlipidemia, unspecified: Secondary | ICD-10-CM | POA: Insufficient documentation

## 2021-12-16 DIAGNOSIS — Z7901 Long term (current) use of anticoagulants: Secondary | ICD-10-CM | POA: Diagnosis not present

## 2021-12-16 DIAGNOSIS — Z8673 Personal history of transient ischemic attack (TIA), and cerebral infarction without residual deficits: Secondary | ICD-10-CM | POA: Insufficient documentation

## 2021-12-16 DIAGNOSIS — R011 Cardiac murmur, unspecified: Secondary | ICD-10-CM

## 2021-12-16 DIAGNOSIS — I480 Paroxysmal atrial fibrillation: Secondary | ICD-10-CM

## 2021-12-16 DIAGNOSIS — I10 Essential (primary) hypertension: Secondary | ICD-10-CM | POA: Insufficient documentation

## 2021-12-16 DIAGNOSIS — E119 Type 2 diabetes mellitus without complications: Secondary | ICD-10-CM | POA: Insufficient documentation

## 2021-12-16 DIAGNOSIS — D6869 Other thrombophilia: Secondary | ICD-10-CM | POA: Diagnosis not present

## 2021-12-16 DIAGNOSIS — R278 Other lack of coordination: Secondary | ICD-10-CM

## 2021-12-16 DIAGNOSIS — K219 Gastro-esophageal reflux disease without esophagitis: Secondary | ICD-10-CM | POA: Diagnosis not present

## 2021-12-16 DIAGNOSIS — M6281 Muscle weakness (generalized): Secondary | ICD-10-CM

## 2021-12-16 DIAGNOSIS — M25531 Pain in right wrist: Secondary | ICD-10-CM

## 2021-12-16 DIAGNOSIS — Z6841 Body Mass Index (BMI) 40.0 and over, adult: Secondary | ICD-10-CM | POA: Insufficient documentation

## 2021-12-16 NOTE — Progress Notes (Signed)
Primary Care Physician: Orpah Melter, MD Primary Cardiologist: Dr Ellyn Hack Primary Electrophysiologist: Dr Curt Bears Referring Physician: Dr Evie Lacks Charrier is a 76 y.o. female with a history of DM, HTN, GERD, HLD, prior CVA, and new onset paroxysmal atrial fibrillation who presents for follow up in the [facility]. 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 The device clinic received an alert for two, 4-minute episodes of afib on 08/06/19. Patient has a CHADS2VASC score of 6. She was unaware of her arrhythmia. She does state that since her stroke she has had issues with balance and nausea. She denies significant alcohol use or snoring.   On follow up today, patient reports that she has done well from a cardiac standpoint. Her ILR shows 0.1% afib burden. No bleeding issues on anticoagulation.   Today, she denies symptoms of palpitations, chest pain, shortness of breath, orthopnea, PND, lower extremity edema, presyncope, syncope, snoring, daytime somnolence, bleeding. The patient is tolerating medications without difficulties and is otherwise without complaint today.   Atrial Fibrillation Risk Factors:  she does not have symptoms or diagnosis of sleep apnea. she does not have a history of rheumatic fever. she does not have a history of alcohol use. The patient does not have a history of early familial atrial fibrillation or other arrhythmias.  she has a BMI of Body mass index is 43.38 kg/m.Marland Kitchen Filed Weights   12/16/21 1537  Weight: 107.6 kg    Family History  Problem Relation Age of Onset   Diabetes Mother    Hyperlipidemia Mother    Stroke Mother 81   Liver cancer Father    Hypertension Sister    Hyperlipidemia Sister    Diabetes Sister        On insulin   Depression Sister    Kidney disease Brother        After dialysis he had renal transplant   Diabetes Brother    Coronary artery  disease Brother    Peripheral Artery Disease Brother    Stroke Brother 66   Bladder Cancer Brother    Diabetes Brother      Atrial Fibrillation Management history:  Previous antiarrhythmic drugs: none Previous cardioversions: none Previous ablations: none CHADS2VASC score: 6 Anticoagulation history: Eliquis   Past Medical History:  Diagnosis Date   Actinic keratosis    Followed by Dr. Allyson Sabal   Anxiety    Diabetes mellitus, type II, insulin dependent ([place])    Associated with obesity: She takes Lantus, Victoza & Actos   Diplopia    Essential hypertension    GERD (gastroesophageal reflux disease)    Hyperlipidemia    Morbid obesity with BMI of 40.0-44.9, adult ([place])    Stroke ([place]) 2021   Past Surgical History:  Procedure Laterality Date   BREAST BIOPSY  1997   CARPAL TUNNEL RELEASE Right 11/12/2021   Procedure: CARPAL TUNNEL RELEASE;  Surgeon: Mordecai Rasmussen, MD;  Location: [facility];  Service: Orthopedics;  Laterality: Right;  pt knows to arrive at 6:15   LEFT HEART CATH AND CORONARY ANGIOGRAPHY N/A 01/05/2017   Procedure: LEFT HEART CATH AND CORONARY ANGIOGRAPHY;  Surgeon: Leonie Man, MD;  Location: [facility];  Service: Cardiovascular: Angiographically normal coronary arteries with a right dominant system.  Normal EF 55-65%.   LOOP RECORDER INSERTION N/A 08/01/2019   Procedure: LOOP RECORDER INSERTION;  Surgeon: Constance Haw, MD;  Location: [facility];  Service: Cardiovascular;  Laterality: N/A;    Current Outpatient Medications  Medication Sig Dispense Refill   acetaminophen (TYLENOL) 650 MG CR tablet Take 650 mg by mouth every 8 (eight) hours as needed for pain.     ALPRAZolam (XANAX) 0.25 MG tablet Take 0.25 mg by mouth 3 (three) times daily as needed.     atorvastatin (LIPITOR) 40 MG tablet Take 1 tablet (40 mg total) by mouth daily. 30 tablet 2   blood glucose meter kit and supplies KIT Dispense based on patient and insurance preference.  Use up to four times daily as directed. (FOR ICD-9 250.00, 250.01). 1 each 0   carvedilol (COREG) 12.5 MG tablet Take 1 tablet (12.5 mg total) by mouth 2 (two) times daily. 180 tablet 2   diphenhydramine-acetaminophen (TYLENOL PM) 25-500 MG TABS tablet Take 2 tablets by mouth at bedtime.     ELIQUIS 5 MG TABS tablet TAKE ONE TABLET BY MOUTH EVERY MORNING and TAKE ONE TABLET BY MOUTH EVERY EVENING 180 tablet 2   gabapentin (NEURONTIN) 100 MG capsule TAKE ONE CAPSULE BY MOUTH THREE TIMES DAILY (Patient taking differently: Take 100 mg by mouth 2 (two) times daily.) 90 capsule 0   Insulin Syringe-Needle U-100 (RELION INSULIN SYR .3CC/29G) 29G X 1/2" 0.3 ML MISC 1 each by Does not apply route in the morning and at bedtime. 100 each 0   Lancets (ONETOUCH DELICA PLUS [id number]) MISC USE 1 LANCET TO CHECK GLUCOSE UP TO 4 TIMES DAILY     LANTUS SOLOSTAR 100 UNIT/ML Solostar Pen Inject 30 Units into the skin in the morning.     lisinopril-hydrochlorothiazide (ZESTORETIC) 20-25 MG tablet Take 1 tablet by mouth daily.     nitroGLYCERIN (NITROSTAT) 0.4 MG SL tablet Place 1 tablet (0.4 mg total) under the tongue every 5 (five) minutes as needed for chest pain. 25 tablet 5   ONETOUCH VERIO test strip USE 1 STRIP TO CHECK GLUCOSE UP TO 4 TIMES DAILY AS DIRECTED     pioglitazone (ACTOS) 45 MG tablet Take 45 mg by mouth daily.     RELION PEN NEEDLES 31G X 6 MM MISC USE 1 IN THE MORNING AND AT BEDTIME     traMADol (ULTRAM) 50 MG tablet Take 1 tablet (50 mg total) by mouth every 12 (twelve) hours as needed. 20 tablet 0   VICTOZA 18 MG/3ML SOPN Inject 1.8 mg into the skin daily.     No current facility-administered medications for this encounter.    Allergies  Allergen Reactions   Livalo [Pitavastatin]     MYALGIAS    Sulfa Antibiotics Itching    Itching and red spots.    Social History   Socioeconomic History   Marital status: Widowed    Spouse name: Not on file   Number of children: 3   Years of  education: 58   Highest education level: Not on file  Occupational History    Comment: Retired  Tobacco Use   Smoking status: Never   Smokeless tobacco: Never   Tobacco comments:    Never smoke 12/16/21  Substance and Sexual Activity   Alcohol use: No   Drug use: No   Sexual activity: Not Currently  Other Topics Concern   Not on file  Social History Narrative   She is a widowed mother of 3 with 5 grandchildren. She lives with one of her daughters and grandson. She is a coming by her sister. She tries to walk, but does not do it  regularly.   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   Coffee every morning, tea rare   Right handed    Social Determinants of Health   Financial Resource Strain: Not on file  Food Insecurity: Not on file  Transportation Needs: Not on file  Physical Activity: Not on file  Stress: Not on file  Social Connections: Not on file  Intimate Partner Violence: Not on file     ROS- All systems are reviewed and negative except as per the HPI above.  Physical Exam: Vitals:   12/16/21 1537  BP: (!) 158/76  Pulse: 63  Weight: 107.6 kg  Height: '5\' 2"'  (1.575 m)     GEN- The patient is a well appearing obese elderly female, alert and oriented x 3 today.   HEENT-head normocephalic, atraumatic, sclera clear, conjunctiva pink, hearing intact, trachea midline. Lungs- Clear to ausculation bilaterally, normal work of breathing Heart- Regular rate and rhythm, no rubs or gallops, 2/6 systolic murmur GI- soft, NT, ND, + BS Extremities- no clubbing, cyanosis, or edema MS- no significant deformity or atrophy Skin- no rash or lesion Psych- euthymic mood, full affect Neuro- strength and sensation are intact   Wt Readings from Last 3 Encounters:  12/16/21 107.6 kg  11/24/21 112.5 kg  11/06/21 112.9 kg    EKG today demonstrates  SR Vent. rate 63 BPM PR interval 170 ms QRS duration 82 ms QT/QTcB 416/425 ms  Echo 08/01/19 demonstrated  1. Left  ventricular ejection fraction, by estimation, is 60 to 65%. The  left ventricle has normal function. The left ventricle has no regional  wall motion abnormalities. Left ventricular diastolic parameters are  consistent with Grade I diastolic dysfunction (impaired relaxation). Elevated left atrial pressure.   2. Right ventricular systolic function is normal. The right ventricular  size is normal. There is normal pulmonary artery systolic pressure. The estimated right ventricular systolic pressure is 86.7 mmHg.   3. The mitral valve is normal in structure. Trivial mitral valve  regurgitation. No evidence of mitral stenosis.   4. Tricuspid valve regurgitation is moderate.   5. The aortic valve is normal in structure. Aortic valve regurgitation is  not visualized. No aortic stenosis is present.   6. The inferior vena cava is normal in size with greater than 50%  respiratory variability, suggesting right atrial pressure of 3 mmHg.   Conclusion(s)/Recommendation(s): No intracardiac source of embolism detected on this transthoracic study. A transesophageal echocardiogram is recommended to exclude cardiac source of embolism if clinically indicated.   Epic records are reviewed at length today  CHA2DS2-VASc Score = 7  The patient's score is based upon: CHF History: 0 HTN History: 1 Diabetes History: 1 Stroke History: 2 Vascular Disease History: 0 Age Score: 2 Gender Score: 1      ASSESSMENT AND PLAN: 1. Paroxysmal Atrial Fibrillation (ICD10:  I48.0) The patient's CHA2DS2-VASc score is 7, indicating a 11.2% annual risk of stroke.   AF burden on ILR is 0.1% Continue Eliquis 5 mg BID Continue Coreg 12.5 mg BID  2. Secondary Hypercoagulable State (ICD10:  D68.69) The patient is at significant risk for stroke/thromboembolism based upon her CHA2DS2-VASc Score of 7.  Continue Apixaban (Eliquis).   3. Obesity Body mass index is 43.38 kg/m. Lifestyle modification was discussed and encouraged  including regular physical activity and weight reduction.  4. HTN Stable, no changes today.  5. Murmur  Patient has 2/6 systolic murmur, not noted prior. Will check echocardiogram.    Follow  up in the AF clinic in one year.    [facility] [address] [place] [place], [place] [address] [phone] 12/16/2021 3:42 PM

## 2021-12-16 NOTE — Therapy (Signed)
OUTPATIENT OCCUPATIONAL THERAPY ORTHO TREATMENT  Patient Name: [name] MRN: [id number] DOB:1945/04/08, 76 y.o., female Today's Date: 12/16/2021  PCP: Orpah Melter, MD REFERRING PROVIDER: Mordecai Rasmussen, MD    OT End of Session - 12/16/21 1407     Visit Number 5    Number of Visits 16    Date for OT Re-Evaluation 01/19/22    Authorization Type [employer] M[employer]    Progress Note Due on Visit 10    OT Start Time 1402    OT Stop Time 1445    OT Time Calculation (min) 43 min    Activity Tolerance Patient tolerated treatment well    Behavior During Therapy WFL for tasks assessed/performed              Past Medical History:  Diagnosis Date   Actinic keratosis    Followed by Dr. Allyson Sabal   Anxiety    Diabetes mellitus, type II, insulin dependent ([place])    Associated with obesity: She takes Lantus, Victoza & Actos   Diplopia    Essential hypertension    GERD (gastroesophageal reflux disease)    Hyperlipidemia    Morbid obesity with BMI of 40.0-44.9, adult ([place])    Stroke ([place]) 2021   Past Surgical History:  Procedure Laterality Date   BREAST BIOPSY  1997   CARPAL TUNNEL RELEASE Right 11/12/2021   Procedure: CARPAL TUNNEL RELEASE;  Surgeon: Mordecai Rasmussen, MD;  Location: [facility];  Service: Orthopedics;  Laterality: Right;  pt knows to arrive at 6:15   LEFT HEART CATH AND CORONARY ANGIOGRAPHY N/A 01/05/2017   Procedure: LEFT HEART CATH AND CORONARY ANGIOGRAPHY;  Surgeon: Leonie Man, MD;  Location: [facility];  Service: Cardiovascular: Angiographically normal coronary arteries with a right dominant system.  Normal EF 55-65%.   LOOP RECORDER INSERTION N/A 08/01/2019   Procedure: LOOP RECORDER INSERTION;  Surgeon: Constance Haw, MD;  Location: [facility];  Service: Cardiovascular;  Laterality: N/A;   Patient Active Problem List   Diagnosis Date Noted   Paroxysmal atrial fibrillation ([place]) 08/08/2019   Secondary hypercoagulable  state ([place]) 08/08/2019   Acute cerebrovascular accident of cerebellum ([place]) 07/31/2019   GERD (gastroesophageal reflux disease)    Leukocytosis    Morbid obesity ([place]) 01/25/2018   Chest pain with low risk for cardiac etiology 12/31/2016   Essential hypertension 12/31/2016   Hyperlipidemia 12/31/2016   DM (diabetes mellitus), type 2 with complications ([place]) 19/41/7408    ONSET DATE: NEW referral 11/17/21, 10/20/2021 (referral date)   REFERRING DIAG: Z98.890 (ICD-10-CM) - S/P carpal tunnel release recently on 11/12/21 S52.531D (ICD-10-CM) - Closed Colles' fracture of right radius with routine healing, subsequent encounter (casted for 6 weeks)  **Resume OT following carpal tunnel release surgery   THERAPY DIAG:  Stiffness of right hand, not elsewhere classified  Stiffness of right wrist, not elsewhere classified  Pain in right wrist  Other lack of coordination  Muscle weakness (generalized)  Rationale for Evaluation and Treatment Rehabilitation  SUBJECTIVE:   SUBJECTIVE STATEMENT: No pain today Pt accompanied by:  son  PERTINENT HISTORY: recent CTR 11/12/21, Rt distal radius fx 08/06/21 (casted 6 weeks), CTS, DM, HTN, A-fib, CVA 2021  PRECAUTIONS: Fall and Other: Loop recorder  WEIGHT BEARING RESTRICTIONS No  PAIN:  Are you having pain? Yes: NPRS scale: 0-3/10 Pain location: Rt ulnar wrist and all fingertips except small finger Pain description: achy at wrist, numbness at fingers Aggravating factors: P/ROM, but worse at night  Relieving factors: nothing  FALLS: Has patient fallen in last 6 months? Yes. Number of falls 1  LIVING ENVIRONMENT: Lives with: lives with their family Pt lives in 1 story home w/ basement, 5 steps to enter house Has following equipment at home: Lobbyist, Environmental consultant - 2 wheeled, shower chair, and Grab bars  PLOF: Independent  PATIENT GOALS hold things in Rt hand better to write and eat, improve movement and strength Rt  hand  OBJECTIVE:   HAND DOMINANCE: Right   **BELOW TABLE: first numbers are 10/29/21 eval, second number is 11/19/21 re-eval  Active ROM Right eval Left eval  Shoulder flexion    Shoulder abduction    Shoulder adduction    Shoulder extension    Shoulder internal rotation    Shoulder external rotation    Elbow flexion    Elbow extension    Wrist flexion 10/29/21 = 35,  11/19/21 = 38   Wrist extension 28 25   Wrist ulnar deviation    Wrist radial deviation    Wrist pronation WFLs   Wrist supination 75 75   (Blank rows = not tested)  Active ROM Right eval Left eval  Thumb MCP (0-60)    Thumb IP (0-80)    Thumb Radial abd/add (0-55)     Thumb Palmar abd/add (0-45) 65 70 70   Thumb Opposition to Small Finger     Index MCP (0-90)     Index PIP (0-100) 65 65    Index DIP (0-70)      Long MCP (0-90)      Long PIP (0-100) 70  70    Long DIP (0-70)      Ring MCP (0-90)      Ring PIP (0-100) 72  70    Ring DIP (0-70)      Little MCP (0-90)      Little PIP (0-100) 55 60    Little DIP (0-70)      (Blank rows = not tested)    HAND FUNCTION: **Did NOT reassess grip or pinch strength on 11/19/21 d/t current precautions from CTR  12/14/21: Grip Rt = 11.4 lbs, Lt = 39.4 lbs 10/29/21: Grip strength: Right: 4.8 lbs; Left: 33.5 lbs, Lateral pinch: Right: 3 lbs, Left: 11 lbs, and 3 point pinch: Right: 1 lbs, Left: 11 lbs   OBSERVATIONS: Pt also w/ arthritis in hands. Tightness more in PIP joints than MP joints of fingers Rt hand. Also noted thumb CMC instability (? From arthritis)    TODAY'S TREATMENT:   Ultrasound x 8 min. to palm and incision area d/t scar tissue/tightness at 20% pulsed, 3 Mhz, 0.8 wts/cm2   A/ROM and P/ROM to fingers in IP flex, MP flex, and composite flexion.   Manipulating checkers (up to 3) for fingertip to/from palm translation and stacking 3 stacks of 3. Turning block over in fingertips in numerical order for manipulation  Gripper set at level 1  resistance to pick up blocks Rt hand for sustained grip strength w/ mod difficulty, 1 rest break    HOME EXERCISE PROGRAM: 12/08/21: HEP including A/ROM, P/ROM to hand and wrist, light putty    12/14/21: scar massage, gel pad wear and care, wrist strengthening HEP   GOALS:  SHORT TERM GOALS: Target date: 12/20/21  Independent with HEP for wrist and hand ROM and hand strengthening Baseline: Goal status: MET  2.  Pt to increase Rt wrist flex by 5* or greater and extension by 10* or greater Baseline:  flex = 38*, ext = 25* Goal status: IN PROGRESS  3.  Pt to improve PIP flexion digits 2-5 Rt hand by 10* or greater Baseline: 65*, 70*, 72*, 60*  Goal status: IN PROGRESS  4.  Pt to return to using Rt hand as dominant hand for BADLS including writing and eating Baseline: using Rt hand some for eating since CTR Goal status: IN PROGRESS  5.  Pt to oppose Rt thumb to 5th digit  Baseline: 3rd digit only Goal status: IN PROGRESS (now to 4th digit)   6.  Pt to improve Rt grip strength to 8 lbs or greater Baseline: 4 lbs on 10/29/21 Goal status: MET (11.4 LBS)   LONG TERM GOALS: Target date: 01/19/22    Improve coordination Rt hand as evidenced by performing 9 hole peg test in 50 sec or less Baseline: 70.47 sec, 11/19/21 = 63 sec Goal status: Revised  2.  Pt to improve grip strength Rt hand to 15 lbs  Baseline: 4 lbs on 10/29/21 Goal status: INITIAL  3.  Pt to improve lateral and tip pinch Rt hand by 3 lbs each Baseline: Lat = 3, 3 tip = 1 Goal status: INITIAL  4.  Pt to verbalize understanding of pain reduction strategies for Rt hand and wrist Baseline:  Goal status: INITIAL  5.  Pt to be able to lift and hold 3 lb object Rt hand w/o drops Baseline:  Goal status: INITIAL  ASSESSMENT:  CLINICAL IMPRESSION: Patient progressing per protocol. Pt limited in finger ROM d/t stiffness from injury as well as d/t OA  PERFORMANCE DEFICITS in functional skills including ADLs, IADLs,  coordination, dexterity, sensation, edema, ROM, strength, pain, flexibility, FMC, GMC, balance, decreased knowledge of use of DME, and UE functional use.   IMPAIRMENTS are limiting patient from ADLs, IADLs, and leisure.   COMORBIDITIES has co-morbidities such as CTS  that affects occupational performance. Patient will benefit from skilled OT to address above impairments and improve overall function.  MODIFICATION OR ASSISTANCE TO COMPLETE EVALUATION: No modification of tasks or assist necessary to complete an evaluation.  OT OCCUPATIONAL PROFILE AND HISTORY: Problem focused assessment: Including review of records relating to presenting problem.  CLINICAL DECISION MAKING: Moderate - several treatment options, min-mod task modification necessary  REHAB POTENTIAL: Fair time since onset, severe CTS  EVALUATION COMPLEXITY: Moderate      PLAN: OT FREQUENCY: 2x/week  OT DURATION: 8 weeks (beginning 11/19/21)   PLANNED INTERVENTIONS: self care/ADL training, therapeutic exercise, therapeutic activity, manual therapy, passive range of motion, functional mobility training, splinting, electrical stimulation, ultrasound, paraffin, fluidotherapy, moist heat, cryotherapy, contrast bath, patient/family education, coping strategies training, DME and/or AE instructions, and Re-evaluation  RECOMMENDED OTHER SERVICES: NONE at this time  CONSULTED AND AGREED WITH PLAN OF CARE: Patient and family member/caregiver  PLAN FOR NEXT SESSION:  fluidotherapy, review wrist strengthening HEP, continue to work on finger and wrist ROM and strengthening, assess remaining STG's  Hans Eden, OT 12/16/2021, 2:40 PM

## 2021-12-17 NOTE — Progress Notes (Signed)
[facility] Summary Report / Loop Recorder 

## 2021-12-21 ENCOUNTER — Ambulatory Visit: Payer: HMO | Admitting: Occupational Therapy

## 2021-12-21 ENCOUNTER — Encounter: Payer: Self-pay | Admitting: Occupational Therapy

## 2021-12-21 DIAGNOSIS — M25631 Stiffness of right wrist, not elsewhere classified: Secondary | ICD-10-CM

## 2021-12-21 DIAGNOSIS — R278 Other lack of coordination: Secondary | ICD-10-CM

## 2021-12-21 DIAGNOSIS — M25531 Pain in right wrist: Secondary | ICD-10-CM

## 2021-12-21 DIAGNOSIS — H5203 Hypermetropia, bilateral: Secondary | ICD-10-CM | POA: Diagnosis not present

## 2021-12-21 DIAGNOSIS — M6281 Muscle weakness (generalized): Secondary | ICD-10-CM | POA: Diagnosis not present

## 2021-12-21 DIAGNOSIS — E113393 Type 2 diabetes mellitus with moderate nonproliferative diabetic retinopathy without macular edema, bilateral: Secondary | ICD-10-CM | POA: Diagnosis not present

## 2021-12-21 DIAGNOSIS — H353131 Nonexudative age-related macular degeneration, bilateral, early dry stage: Secondary | ICD-10-CM | POA: Diagnosis not present

## 2021-12-21 DIAGNOSIS — M25641 Stiffness of right hand, not elsewhere classified: Secondary | ICD-10-CM

## 2021-12-21 NOTE — Therapy (Signed)
OUTPATIENT OCCUPATIONAL THERAPY ORTHO TREATMENT  Patient Name: [name] MRN: [id number] DOB:01/27/1946, 76 y.o., female Today's Date: 12/21/2021  PCP: Orpah Melter, MD REFERRING PROVIDER: Mordecai Rasmussen, MD    OT End of Session - 12/21/21 1415     Visit Number 6    Number of Visits 16    Date for OT Re-Evaluation 01/19/22    Authorization Type [employer]    Progress Note Due on Visit 10    OT Start Time 1405    OT Stop Time 1450    OT Time Calculation (min) 45 min    Activity Tolerance Patient tolerated treatment well    Behavior During Therapy WFL for tasks assessed/performed              Past Medical History:  Diagnosis Date   Actinic keratosis    Followed by Dr. Allyson Sabal   Anxiety    Diabetes mellitus, type II, insulin dependent ([place])    Associated with obesity: She takes Lantus, Victoza & Actos   Diplopia    Essential hypertension    GERD (gastroesophageal reflux disease)    Hyperlipidemia    Morbid obesity with BMI of 40.0-44.9, adult ([place])    Stroke ([place]) 2021   Past Surgical History:  Procedure Laterality Date   BREAST BIOPSY  1997   CARPAL TUNNEL RELEASE Right 11/12/2021   Procedure: CARPAL TUNNEL RELEASE;  Surgeon: Mordecai Rasmussen, MD;  Location: [facility];  Service: Orthopedics;  Laterality: Right;  pt knows to arrive at 6:15   LEFT HEART CATH AND CORONARY ANGIOGRAPHY N/A 01/05/2017   Procedure: LEFT HEART CATH AND CORONARY ANGIOGRAPHY;  Surgeon: Leonie Man, MD;  Location: [facility];  Service: Cardiovascular: Angiographically normal coronary arteries with a right dominant system.  Normal EF 55-65%.   LOOP RECORDER INSERTION N/A 08/01/2019   Procedure: LOOP RECORDER INSERTION;  Surgeon: Constance Haw, MD;  Location: [facility];  Service: Cardiovascular;  Laterality: N/A;   Patient Active Problem List   Diagnosis Date Noted   Paroxysmal atrial fibrillation ([place]) 08/08/2019   Secondary hypercoagulable  state ([place]) 08/08/2019   Acute cerebrovascular accident of cerebellum ([place]) 07/31/2019   GERD (gastroesophageal reflux disease)    Leukocytosis    Morbid obesity ([place]) 01/25/2018   Chest pain with low risk for cardiac etiology 12/31/2016   Essential hypertension 12/31/2016   Hyperlipidemia 12/31/2016   DM (diabetes mellitus), type 2 with complications ([place]) 01/12/5101    ONSET DATE: NEW referral 11/17/21, 10/20/2021 (referral date)   REFERRING DIAG: Z98.890 (ICD-10-CM) - S/P carpal tunnel release recently on 11/12/21 S52.531D (ICD-10-CM) - Closed Colles' fracture of right radius with routine healing, subsequent encounter (casted for 6 weeks)  **Resume OT following carpal tunnel release surgery   THERAPY DIAG:  Stiffness of right hand, not elsewhere classified  Stiffness of right wrist, not elsewhere classified  Pain in right wrist  Other lack of coordination  Muscle weakness (generalized)  Rationale for Evaluation and Treatment Rehabilitation  SUBJECTIVE:   SUBJECTIVE STATEMENT: No pain today Pt accompanied by:  son  PERTINENT HISTORY: recent CTR 11/12/21, Rt distal radius fx 08/06/21 (casted 6 weeks), CTS, DM, HTN, A-fib, CVA 2021  PRECAUTIONS: Fall and Other: Loop recorder  WEIGHT BEARING RESTRICTIONS No  PAIN:  Are you having pain? Yes: NPRS scale: 0-3/10 Pain location: Rt ulnar wrist and all fingertips except small finger Pain description: achy at wrist, numbness at fingers Aggravating factors: P/ROM, but worse at night  Relieving factors: nothing  FALLS: Has patient fallen in last 6 months? Yes. Number of falls 1  LIVING ENVIRONMENT: Lives with: lives with their family Pt lives in 1 story home w/ basement, 5 steps to enter house Has following equipment at home: Lobbyist, Environmental consultant - 2 wheeled, shower chair, and Grab bars  PLOF: Independent  PATIENT GOALS hold things in Rt hand better to write and eat, improve movement and strength Rt  hand  OBJECTIVE:   HAND DOMINANCE: Right   **BELOW TABLE: first numbers are 10/29/21 eval, second number is 11/19/21 re-eval  Active ROM Right eval Left eval  Shoulder flexion    Shoulder abduction    Shoulder adduction    Shoulder extension    Shoulder internal rotation    Shoulder external rotation    Elbow flexion    Elbow extension    Wrist flexion 10/29/21 = 35,  11/19/21 = 38   Wrist extension 28 25   Wrist ulnar deviation    Wrist radial deviation    Wrist pronation WFLs   Wrist supination 75 75   (Blank rows = not tested)  Active ROM Right eval Left eval  Thumb MCP (0-60)    Thumb IP (0-80)    Thumb Radial abd/add (0-55)     Thumb Palmar abd/add (0-45) 65 70 70   Thumb Opposition to Small Finger     Index MCP (0-90)     Index PIP (0-100) 65 65    Index DIP (0-70)      Long MCP (0-90)      Long PIP (0-100) 70  70    Long DIP (0-70)      Ring MCP (0-90)      Ring PIP (0-100) 72  70    Ring DIP (0-70)      Little MCP (0-90)      Little PIP (0-100) 55 60    Little DIP (0-70)      (Blank rows = not tested)    HAND FUNCTION:  12/14/21: Grip Rt = 11.4 lbs, Lt = 39.4 lbs 10/29/21: Grip strength: Right: 4.8 lbs; Left: 33.5 lbs, Lateral pinch: Right: 3 lbs, Left: 11 lbs, and 3 point pinch: Right: 1 lbs, Left: 11 lbs   OBSERVATIONS: Pt also w/ arthritis in hands. Tightness more in PIP joints than MP joints of fingers Rt hand. Also noted thumb CMC instability (? From arthritis)    TODAY'S TREATMENT:   Fluidotherapy x 12 minutes for Rt hand to stiffness. No adverse reactions.   Assessed remaining STG's - see below for measurements  A/ROM and P/ROM to fingers in IP flex, MP flex, and composite flexion.   Wrist strengthening HEP x 10 reps each w/ 1 lb weight, 2nd set w/ 2 lb weight w/ mod to max difficulty   Placing graded resistance clothespins on antenna (yellow to green resistance) for lateral pinch strength to assist w/ pinching handle of curling  iron   HOME EXERCISE PROGRAM: 12/08/21: HEP including A/ROM, P/ROM to hand and wrist, light putty  12/14/21: scar massage, gel pad wear and care, wrist strengthening HEP   GOALS:  SHORT TERM GOALS: Target date: 12/20/21  Independent with HEP for wrist and hand ROM and hand strengthening Baseline: Goal status: MET  2.  Pt to increase Rt wrist flex by 5* or greater and extension by 10* or greater Baseline: flex = 38*, ext = 25* Goal status: PARTIALLY MET (flex and ext = 40*)  3.  Pt to improve PIP flexion digits 2-5 Rt hand by 10* or greater Baseline: 65*, 70*, 72*, 60*  Goal status: PARTIALLY MET (75*, 78*, 80*, 70*)  4.  Pt to return to using Rt hand as dominant hand for BADLS including writing and eating Baseline: using Rt hand some for eating since CTR Goal status: MET  5.  Pt to oppose Rt thumb to 5th digit  Baseline: 3rd digit only Goal status: IN PROGRESS (now to 4th digit)   6.  Pt to improve Rt grip strength to 8 lbs or greater Baseline: 4 lbs on 10/29/21 Goal status: MET (11.4 LBS)   LONG TERM GOALS: Target date: 01/19/22    Improve coordination Rt hand as evidenced by performing 9 hole peg test in 50 sec or less Baseline: 70.47 sec, 11/19/21 = 63 sec Goal status: Revised  2.  Pt to improve grip strength Rt hand to 15 lbs  Baseline: 4 lbs on 10/29/21 Goal status: INITIAL  3.  Pt to improve lateral and tip pinch Rt hand by 3 lbs each Baseline: Lat = 3, 3 tip = 1 Goal status: INITIAL  4.  Pt to verbalize understanding of pain reduction strategies for Rt hand and wrist Baseline:  Goal status: INITIAL  5.  Pt to be able to lift and hold 3 lb object Rt hand w/o drops Baseline:  Goal status: INITIAL  ASSESSMENT:  CLINICAL IMPRESSION: Patient progressing with Rt hand function  PERFORMANCE DEFICITS in functional skills including ADLs, IADLs, coordination, dexterity, sensation, edema, ROM, strength, pain, flexibility, FMC, GMC, balance, decreased knowledge of use  of DME, and UE functional use.   IMPAIRMENTS are limiting patient from ADLs, IADLs, and leisure.   COMORBIDITIES has co-morbidities such as CTS  that affects occupational performance. Patient will benefit from skilled OT to address above impairments and improve overall function.  MODIFICATION OR ASSISTANCE TO COMPLETE EVALUATION: No modification of tasks or assist necessary to complete an evaluation.  OT OCCUPATIONAL PROFILE AND HISTORY: Problem focused assessment: Including review of records relating to presenting problem.  CLINICAL DECISION MAKING: Moderate - several treatment options, min-mod task modification necessary  REHAB POTENTIAL: Fair time since onset, severe CTS  EVALUATION COMPLEXITY: Moderate      PLAN: OT FREQUENCY: 2x/week  OT DURATION: 8 weeks (beginning 11/19/21)   PLANNED INTERVENTIONS: self care/ADL training, therapeutic exercise, therapeutic activity, manual therapy, passive range of motion, functional mobility training, splinting, electrical stimulation, ultrasound, paraffin, fluidotherapy, moist heat, cryotherapy, contrast bath, patient/family education, coping strategies training, DME and/or AE instructions, and Re-evaluation  RECOMMENDED OTHER SERVICES: NONE at this time  CONSULTED AND AGREED WITH PLAN OF CARE: Patient and family member/caregiver  PLAN FOR NEXT SESSION:  fluidotherapy, gripper, progress towards LTG's (anticipate d/c by 01/08/22)    Hans Eden, OT 12/21/2021, 2:56 PM

## 2021-12-22 ENCOUNTER — Encounter: Payer: Self-pay | Admitting: Orthopedic Surgery

## 2021-12-22 ENCOUNTER — Ambulatory Visit (INDEPENDENT_AMBULATORY_CARE_PROVIDER_SITE_OTHER): Payer: HMO | Admitting: Orthopedic Surgery

## 2021-12-22 VITALS — Ht 62.0 in | Wt 237.0 lb

## 2021-12-22 DIAGNOSIS — G5601 Carpal tunnel syndrome, right upper limb: Secondary | ICD-10-CM

## 2021-12-22 DIAGNOSIS — S52531D Colles' fracture of right radius, subsequent encounter for closed fracture with routine healing: Secondary | ICD-10-CM

## 2021-12-23 ENCOUNTER — Ambulatory Visit: Payer: HMO | Admitting: Occupational Therapy

## 2021-12-23 DIAGNOSIS — M6281 Muscle weakness (generalized): Secondary | ICD-10-CM

## 2021-12-23 DIAGNOSIS — M25631 Stiffness of right wrist, not elsewhere classified: Secondary | ICD-10-CM

## 2021-12-23 DIAGNOSIS — R278 Other lack of coordination: Secondary | ICD-10-CM

## 2021-12-23 DIAGNOSIS — M25641 Stiffness of right hand, not elsewhere classified: Secondary | ICD-10-CM

## 2021-12-23 NOTE — Therapy (Signed)
OUTPATIENT OCCUPATIONAL THERAPY ORTHO TREATMENT  Patient Name: [name] MRN: [id number] DOB:05/12/1945, 76 y.o., female Today's Date: 12/23/2021  PCP: Orpah Melter, MD REFERRING PROVIDER: Mordecai Rasmussen, MD    OT End of Session - 12/23/21 1418     Visit Number 7    Number of Visits 16    Date for OT Re-Evaluation 01/19/22    Authorization Type [employer]    Progress Note Due on Visit 10    OT Start Time 1405    OT Stop Time 1445    OT Time Calculation (min) 40 min    Activity Tolerance Patient tolerated treatment well    Behavior During Therapy WFL for tasks assessed/performed              Past Medical History:  Diagnosis Date   Actinic keratosis    Followed by Dr. Allyson Sabal   Anxiety    Diabetes mellitus, type II, insulin dependent ([place])    Associated with obesity: She takes Lantus, Victoza & Actos   Diplopia    Essential hypertension    GERD (gastroesophageal reflux disease)    Hyperlipidemia    Morbid obesity with BMI of 40.0-44.9, adult ([place])    Stroke ([place]) 2021   Past Surgical History:  Procedure Laterality Date   BREAST BIOPSY  1997   CARPAL TUNNEL RELEASE Right 11/12/2021   Procedure: CARPAL TUNNEL RELEASE;  Surgeon: Mordecai Rasmussen, MD;  Location: [facility];  Service: Orthopedics;  Laterality: Right;  pt knows to arrive at 6:15   LEFT HEART CATH AND CORONARY ANGIOGRAPHY N/A 01/05/2017   Procedure: LEFT HEART CATH AND CORONARY ANGIOGRAPHY;  Surgeon: Leonie Man, MD;  Location: [facility];  Service: Cardiovascular: Angiographically normal coronary arteries with a right dominant system.  Normal EF 55-65%.   LOOP RECORDER INSERTION N/A 08/01/2019   Procedure: LOOP RECORDER INSERTION;  Surgeon: Constance Haw, MD;  Location: [facility];  Service: Cardiovascular;  Laterality: N/A;   Patient Active Problem List   Diagnosis Date Noted   Paroxysmal atrial fibrillation ([place]) 08/08/2019   Secondary hypercoagulable  state ([place]) 08/08/2019   Acute cerebrovascular accident of cerebellum ([place]) 07/31/2019   GERD (gastroesophageal reflux disease)    Leukocytosis    Morbid obesity ([place]) 01/25/2018   Chest pain with low risk for cardiac etiology 12/31/2016   Essential hypertension 12/31/2016   Hyperlipidemia 12/31/2016   DM (diabetes mellitus), type 2 with complications ([place]) 05/39/7673    ONSET DATE: NEW referral 11/17/21, 10/20/2021 (referral date)   REFERRING DIAG: Z98.890 (ICD-10-CM) - S/P carpal tunnel release recently on 11/12/21 S52.531D (ICD-10-CM) - Closed Colles' fracture of right radius with routine healing, subsequent encounter (casted for 6 weeks)  **Resume OT following carpal tunnel release surgery   THERAPY DIAG:  Stiffness of right hand, not elsewhere classified  Stiffness of right wrist, not elsewhere classified  Other lack of coordination  Muscle weakness (generalized)  Rationale for Evaluation and Treatment Rehabilitation  SUBJECTIVE:   SUBJECTIVE STATEMENT: No pain today Pt accompanied by:  son  PERTINENT HISTORY: recent CTR 11/12/21, Rt distal radius fx 08/06/21 (casted 6 weeks), CTS, DM, HTN, A-fib, CVA 2021  PRECAUTIONS: Fall and Other: Loop recorder  WEIGHT BEARING RESTRICTIONS No  PAIN:  Are you having pain? Yes: NPRS scale: 0-3/10 Pain location: Rt wrist Pain description: achy at wrist, numbness at fingers Aggravating factors: P/ROM, but worse at night Relieving factors: nothing  FALLS: Has patient fallen in last 6 months?  Yes. Number of falls 1  LIVING ENVIRONMENT: Lives with: lives with their family Pt lives in 1 story home w/ basement, 5 steps to enter house Has following equipment at home: Lobbyist, Environmental consultant - 2 wheeled, shower chair, and Grab bars  PLOF: Independent  PATIENT GOALS hold things in Rt hand better to write and eat, improve movement and strength Rt hand  OBJECTIVE:   HAND DOMINANCE: Right   **BELOW TABLE: first numbers are  10/29/21 eval, second number is 11/19/21 re-eval  Active ROM Right eval Left eval  Shoulder flexion    Shoulder abduction    Shoulder adduction    Shoulder extension    Shoulder internal rotation    Shoulder external rotation    Elbow flexion    Elbow extension    Wrist flexion 10/29/21 = 35,  11/19/21 = 38   Wrist extension 28 25   Wrist ulnar deviation    Wrist radial deviation    Wrist pronation WFLs   Wrist supination 75 75   (Blank rows = not tested)  Active ROM Right eval Left eval  Thumb MCP (0-60)    Thumb IP (0-80)    Thumb Radial abd/add (0-55)     Thumb Palmar abd/add (0-45) 65 70 70   Thumb Opposition to Small Finger     Index MCP (0-90)     Index PIP (0-100) 65 65    Index DIP (0-70)      Long MCP (0-90)      Long PIP (0-100) 70  70    Long DIP (0-70)      Ring MCP (0-90)      Ring PIP (0-100) 72  70    Ring DIP (0-70)      Little MCP (0-90)      Little PIP (0-100) 55 60    Little DIP (0-70)      (Blank rows = not tested)    HAND FUNCTION:  12/14/21: Grip Rt = 11.4 lbs, Lt = 39.4 lbs 10/29/21: Grip strength: Right: 4.8 lbs; Left: 33.5 lbs, Lateral pinch: Right: 3 lbs, Left: 11 lbs, and 3 point pinch: Right: 1 lbs, Left: 11 lbs   OBSERVATIONS: Pt also w/ arthritis in hands. Tightness more in PIP joints than MP joints of fingers Rt hand. Also noted thumb CMC instability (? From arthritis)    TODAY'S TREATMENT:   Fluidotherapy x 10 minutes for Rt hand to stiffness. No adverse reactions.   Gripper set at level 1 resistance to pick up blocks for sustained grip strength Rt hand with min to mod difficulty and increased difficult/drops w/ fatigue.   Forearm gym for combined wrist/forearm movement. Followed by wrist winder x 2 wind ups/wind downs  Pt/son had questions re: paraffin - pt shown paraffin and demo use and will have pt try next session    HOME EXERCISE PROGRAM: 12/08/21: HEP including A/ROM, P/ROM to hand and wrist, light putty   12/14/21: scar massage, gel pad wear and care, wrist strengthening HEP   GOALS:  SHORT TERM GOALS: Target date: 12/20/21  Independent with HEP for wrist and hand ROM and hand strengthening Baseline: Goal status: MET  2.  Pt to increase Rt wrist flex by 5* or greater and extension by 10* or greater Baseline: flex = 38*, ext = 25* Goal status: PARTIALLY MET (flex and ext = 40*)  3.  Pt to improve PIP flexion digits 2-5 Rt hand by 10* or greater Baseline: 65*, 70*, 72*, 60*  Goal status: PARTIALLY MET (75*, 78*, 80*, 70*)  4.  Pt to return to using Rt hand as dominant hand for BADLS including writing and eating Baseline: using Rt hand some for eating since CTR Goal status: MET  5.  Pt to oppose Rt thumb to 5th digit  Baseline: 3rd digit only Goal status: IN PROGRESS (now to 4th digit)   6.  Pt to improve Rt grip strength to 8 lbs or greater Baseline: 4 lbs on 10/29/21 Goal status: MET (11.4 LBS)   LONG TERM GOALS: Target date: 01/19/22    Improve coordination Rt hand as evidenced by performing 9 hole peg test in 50 sec or less Baseline: 70.47 sec, 11/19/21 = 63 sec Goal status: Revised  2.  Pt to improve grip strength Rt hand to 15 lbs  Baseline: 4 lbs on 10/29/21 Goal status: INITIAL  3.  Pt to improve lateral and tip pinch Rt hand by 3 lbs each Baseline: Lat = 3, 3 tip = 1 Goal status: INITIAL  4.  Pt to verbalize understanding of pain reduction strategies for Rt hand and wrist Baseline:  Goal status: INITIAL  5.  Pt to be able to lift and hold 3 lb object Rt hand w/o drops Baseline:  Goal status: INITIAL  ASSESSMENT:  CLINICAL IMPRESSION: Patient progressing with Rt hand function and motion. Limited by arthritis  PERFORMANCE DEFICITS in functional skills including ADLs, IADLs, coordination, dexterity, sensation, edema, ROM, strength, pain, flexibility, FMC, GMC, balance, decreased knowledge of use of DME, and UE functional use.   IMPAIRMENTS are limiting patient  from ADLs, IADLs, and leisure.   COMORBIDITIES has co-morbidities such as CTS  that affects occupational performance. Patient will benefit from skilled OT to address above impairments and improve overall function.  MODIFICATION OR ASSISTANCE TO COMPLETE EVALUATION: No modification of tasks or assist necessary to complete an evaluation.  OT OCCUPATIONAL PROFILE AND HISTORY: Problem focused assessment: Including review of records relating to presenting problem.  CLINICAL DECISION MAKING: Moderate - several treatment options, min-mod task modification necessary  REHAB POTENTIAL: Fair time since onset, severe CTS  EVALUATION COMPLEXITY: Moderate      PLAN: OT FREQUENCY: 2x/week  OT DURATION: 8 weeks (beginning 11/19/21)   PLANNED INTERVENTIONS: self care/ADL training, therapeutic exercise, therapeutic activity, manual therapy, passive range of motion, functional mobility training, splinting, electrical stimulation, ultrasound, paraffin, fluidotherapy, moist heat, cryotherapy, contrast bath, patient/family education, coping strategies training, DME and/or AE instructions, and Re-evaluation  RECOMMENDED OTHER SERVICES: NONE at this time  CONSULTED AND AGREED WITH PLAN OF CARE: Patient and family member/caregiver  PLAN FOR NEXT SESSION:  paraffin, progress towards LTG's (anticipate d/c by 01/04/22 - if not, will cancel 10/20 appt and d/c 10/24)    Hans Eden, OT 12/23/2021, 2:19 PM

## 2021-12-23 NOTE — Progress Notes (Signed)
Return Patient Visit  Assessment: [name] is a 76 y.o. female with the following: Closed Colles' fracture of right radius Severe right carpal tunnel syndrome.  Plan: [name] is status post right open carpal tunnel release, 11/12/2021.  She is recovering well.  Incision is healed.  During pain in the right hand has resolved.  She still has some numbness, and occasional tingling.  Range of motion in the wrist and fingers are getting better.  Advised her to continue working with occupational therapy.  Anticipate progressive improvement in her symptoms.  Based on her preoperative EMG, she may not have complete resolution of the numbness and tingling, but I do think this will continue to improve.  She is comfortable with contacting the clinic if she has any further issues.  She will follow-up as needed.  Follow-up: Return if symptoms worsen or fail to improve.  Subjective:  Chief Complaint  Patient presents with   Follow-up    Right distal radius fracture and carpal tunnel release    History of Present Illness: [name] is a 76 y.o. female who returns for evaluation of right wrist pain.  She underwent surgery for right carpal tunnel syndrome approximately 6 weeks ago.  Surgery has improved her symptoms.  She reports the burning sensation is completely gone.  Still has some numbness in the median nerve distribution, as well as some tingling.  Range of motion in the right hand and wrist continues to improve.  According to family in the room, she uses the right arm for more activities.  She continues to work with occupational therapy, and is pleased with her progression.  Review of Systems: No fevers or chills + Numbness and tingling. No chest pain No shortness of breath No bowel or bladder dysfunction No GI distress No headaches  Objective: Ht '5\' 2"'$  (1.575 m)   Wt 237 lb (107.5 kg)   BMI 43.35 kg/m   Physical Exam:  General: Alert and oriented., No  acute distress., Seated in a wheelchair., and Obese female. Gait: Ambulating with minimal assistance.  Well-healed surgical incision in the volar aspect of the hand.  She has improved flexion extension of the wrist.  She is almost able to make a full fist.  Fingers are warm and well-perfused.  Decreased sensation to the index and long finger.  IMAGING: I personally ordered and reviewed the following images  No new imaging obtained today.  New Medications:  No orders of the defined types were placed in this encounter.     Mordecai Rasmussen, MD  12/23/2021 2:48 PM

## 2021-12-28 ENCOUNTER — Ambulatory Visit (HOSPITAL_COMMUNITY)
Admission: RE | Admit: 2021-12-28 | Discharge: 2021-12-28 | Disposition: A | Discharge status: Home / Self Care | Payer: HMO | Source: Ambulatory Visit | Attending: Physician Assistant | Admitting: Physician Assistant

## 2021-12-28 ENCOUNTER — Ambulatory Visit: Payer: HMO | Attending: Orthopedic Surgery | Admitting: Occupational Therapy

## 2021-12-28 ENCOUNTER — Encounter: Payer: Self-pay | Admitting: Occupational Therapy

## 2021-12-28 DIAGNOSIS — E119 Type 2 diabetes mellitus without complications: Secondary | ICD-10-CM | POA: Insufficient documentation

## 2021-12-28 DIAGNOSIS — M25531 Pain in right wrist: Secondary | ICD-10-CM | POA: Diagnosis present

## 2021-12-28 DIAGNOSIS — M25641 Stiffness of right hand, not elsewhere classified: Secondary | ICD-10-CM | POA: Insufficient documentation

## 2021-12-28 DIAGNOSIS — I10 Essential (primary) hypertension: Secondary | ICD-10-CM | POA: Insufficient documentation

## 2021-12-28 DIAGNOSIS — R011 Cardiac murmur, unspecified: Secondary | ICD-10-CM | POA: Insufficient documentation

## 2021-12-28 DIAGNOSIS — I517 Cardiomegaly: Secondary | ICD-10-CM | POA: Insufficient documentation

## 2021-12-28 DIAGNOSIS — R278 Other lack of coordination: Secondary | ICD-10-CM | POA: Insufficient documentation

## 2021-12-28 DIAGNOSIS — E785 Hyperlipidemia, unspecified: Secondary | ICD-10-CM | POA: Insufficient documentation

## 2021-12-28 DIAGNOSIS — I4891 Unspecified atrial fibrillation: Secondary | ICD-10-CM | POA: Insufficient documentation

## 2021-12-28 DIAGNOSIS — E669 Obesity, unspecified: Secondary | ICD-10-CM | POA: Insufficient documentation

## 2021-12-28 DIAGNOSIS — M25631 Stiffness of right wrist, not elsewhere classified: Secondary | ICD-10-CM | POA: Insufficient documentation

## 2021-12-28 LAB — ECHOCARDIOGRAM COMPLETE
Area-P 1/2: 3.26 cm2
S' Lateral: 3 cm

## 2021-12-28 NOTE — Therapy (Signed)
OUTPATIENT OCCUPATIONAL THERAPY ORTHO TREATMENT  Patient Name: [name] MRN: [id number] DOB:1945-12-12, 76 y.o., female Today's Date: 12/28/2021  PCP: Orpah Melter, MD REFERRING PROVIDER: Mordecai Rasmussen, MD    OT End of Session - 12/28/21 1413     Visit Number 8    Number of Visits 16    Date for OT Re-Evaluation 01/19/22    Authorization Type [employer]    Progress Note Due on Visit 10    OT Start Time 1405    OT Stop Time 1445    OT Time Calculation (min) 40 min    Activity Tolerance Patient tolerated treatment well    Behavior During Therapy WFL for tasks assessed/performed              Past Medical History:  Diagnosis Date   Actinic keratosis    Followed by Dr. Allyson Sabal   Anxiety    Diabetes mellitus, type II, insulin dependent ([place])    Associated with obesity: She takes Lantus, Victoza & Actos   Diplopia    Essential hypertension    GERD (gastroesophageal reflux disease)    Hyperlipidemia    Morbid obesity with BMI of 40.0-44.9, adult ([place])    Stroke ([place]) 2021   Past Surgical History:  Procedure Laterality Date   BREAST BIOPSY  1997   CARPAL TUNNEL RELEASE Right 11/12/2021   Procedure: CARPAL TUNNEL RELEASE;  Surgeon: Mordecai Rasmussen, MD;  Location: [facility];  Service: Orthopedics;  Laterality: Right;  pt knows to arrive at 6:15   LEFT HEART CATH AND CORONARY ANGIOGRAPHY N/A 01/05/2017   Procedure: LEFT HEART CATH AND CORONARY ANGIOGRAPHY;  Surgeon: Leonie Man, MD;  Location: [facility];  Service: Cardiovascular: Angiographically normal coronary arteries with a right dominant system.  Normal EF 55-65%.   LOOP RECORDER INSERTION N/A 08/01/2019   Procedure: LOOP RECORDER INSERTION;  Surgeon: Constance Haw, MD;  Location: [facility];  Service: Cardiovascular;  Laterality: N/A;   Patient Active Problem List   Diagnosis Date Noted   Paroxysmal atrial fibrillation ([place]) 08/08/2019   Secondary hypercoagulable  state ([place]) 08/08/2019   Acute cerebrovascular accident of cerebellum ([place]) 07/31/2019   GERD (gastroesophageal reflux disease)    Leukocytosis    Morbid obesity ([place]) 01/25/2018   Chest pain with low risk for cardiac etiology 12/31/2016   Essential hypertension 12/31/2016   Hyperlipidemia 12/31/2016   DM (diabetes mellitus), type 2 with complications ([place]) 60/73/7106    ONSET DATE: NEW referral 11/17/21, 10/20/2021 (referral date)   REFERRING DIAG: Z98.890 (ICD-10-CM) - S/P carpal tunnel release recently on 11/12/21 S52.531D (ICD-10-CM) - Closed Colles' fracture of right radius with routine healing, subsequent encounter (casted for 6 weeks)  **Resume OT following carpal tunnel release surgery   THERAPY DIAG:  Stiffness of right hand, not elsewhere classified  Stiffness of right wrist, not elsewhere classified  Other lack of coordination  Rationale for Evaluation and Treatment Rehabilitation  SUBJECTIVE:   SUBJECTIVE STATEMENT: No pain today Pt accompanied by:  son  PERTINENT HISTORY: recent CTR 11/12/21, Rt distal radius fx 08/06/21 (casted 6 weeks), CTS, DM, HTN, A-fib, CVA 2021  PRECAUTIONS: Fall and Other: Loop recorder  WEIGHT BEARING RESTRICTIONS No  PAIN:  Are you having pain? No   FALLS: Has patient fallen in last 6 months? Yes. Number of falls 1  LIVING ENVIRONMENT: Lives with: lives with their family Pt lives in 1 story home w/ basement, 5 steps to enter house Has  following equipment at home: Lobbyist, Walker - 2 wheeled, shower chair, and Grab bars  PLOF: Independent  PATIENT GOALS hold things in Rt hand better to write and eat, improve movement and strength Rt hand  OBJECTIVE:   HAND DOMINANCE: Right   **BELOW TABLE: first numbers are 10/29/21 eval, second number is 11/19/21 re-eval  Active ROM Right eval Left eval  Shoulder flexion    Shoulder abduction    Shoulder adduction    Shoulder extension    Shoulder internal rotation     Shoulder external rotation    Elbow flexion    Elbow extension    Wrist flexion 10/29/21 = 35,  11/19/21 = 38   Wrist extension 28 25   Wrist ulnar deviation    Wrist radial deviation    Wrist pronation WFLs   Wrist supination 75 75   (Blank rows = not tested)  Active ROM Right eval Left eval  Thumb MCP (0-60)    Thumb IP (0-80)    Thumb Radial abd/add (0-55)     Thumb Palmar abd/add (0-45) 65 70 70   Thumb Opposition to Small Finger     Index MCP (0-90)     Index PIP (0-100) 65 65    Index DIP (0-70)      Long MCP (0-90)      Long PIP (0-100) 70  70    Long DIP (0-70)      Ring MCP (0-90)      Ring PIP (0-100) 72  70    Ring DIP (0-70)      Little MCP (0-90)      Little PIP (0-100) 55 60    Little DIP (0-70)      (Blank rows = not tested)    HAND FUNCTION:  12/14/21: Grip Rt = 11.4 lbs, Lt = 39.4 lbs 10/29/21: Grip strength: Right: 4.8 lbs; Left: 33.5 lbs, Lateral pinch: Right: 3 lbs, Left: 11 lbs, and 3 point pinch: Right: 1 lbs, Left: 11 lbs   OBSERVATIONS: Pt also w/ arthritis in hands. Tightness more in PIP joints than MP joints of fingers Rt hand. Also noted thumb CMC instability (? From arthritis)    TODAY'S TREATMENT:   Paraffin x 10 minutes for Rt hand to stiffness. No adverse reactions.   Began checking LTG's in prep for d/c next session:  Grip Rt = 16.3 lbs Rt Lateral pinch = 4 lbs Rt tip pinch = 2 lbs 9 hole peg test Rt = 25 sec  Pt reports difficulty cutting fried egg with a fork - simulated task using yellow putty and built up fork for greater ease and comfort w/ red foam - pt able to apply more pressure. Pt also expressed concern with return to chopping - recommended hand chopper to dice veggies.     HOME EXERCISE PROGRAM: 12/08/21: HEP including A/ROM, P/ROM to hand and wrist, light putty  12/14/21: scar massage, gel pad wear and care, wrist strengthening HEP   GOALS:  SHORT TERM GOALS: Target date: 12/20/21  Independent with HEP for  wrist and hand ROM and hand strengthening Baseline: Goal status: MET  2.  Pt to increase Rt wrist flex by 5* or greater and extension by 10* or greater Baseline: flex = 38*, ext = 25* Goal status: PARTIALLY MET (flex and ext = 40*)  3.  Pt to improve PIP flexion digits 2-5 Rt hand by 10* or greater Baseline: 65*, 70*, 72*, 60*  Goal status: PARTIALLY MET (  75*, 78*, 80*, 70*)  4.  Pt to return to using Rt hand as dominant hand for BADLS including writing and eating Baseline: using Rt hand some for eating since CTR Goal status: MET  5.  Pt to oppose Rt thumb to 5th digit  Baseline: 3rd digit only Goal status: IN PROGRESS (now to 4th digit)   6.  Pt to improve Rt grip strength to 8 lbs or greater Baseline: 4 lbs on 10/29/21 Goal status: MET (11.4 LBS)   LONG TERM GOALS: Target date: 01/19/22    Improve coordination Rt hand as evidenced by performing 9 hole peg test in 50 sec or less Baseline: 70.47 sec, 11/19/21 = 63 sec Goal status: MET (25.78 sec)   2.  Pt to improve grip strength Rt hand to 15 lbs  Baseline: 4 lbs on 10/29/21 Goal status: MET (16.3 LBS)   3.  Pt to improve lateral and tip pinch Rt hand by 3 lbs each Baseline: Lat = 3, 3 tip = 1 Goal status: NOT MET (Lat = 4, 3 tip = 2)  4.  Pt to verbalize understanding of pain reduction strategies for Rt hand and wrist Baseline:  Goal status: MET  5.  Pt to be able to lift and hold 3 lb object Rt hand w/o drops Baseline:  Goal status: INITIAL  ASSESSMENT:  CLINICAL IMPRESSION: Patient progressing with Rt hand function and motion. Limited by arthritis  PERFORMANCE DEFICITS in functional skills including ADLs, IADLs, coordination, dexterity, sensation, edema, ROM, strength, pain, flexibility, FMC, GMC, balance, decreased knowledge of use of DME, and UE functional use.   IMPAIRMENTS are limiting patient from ADLs, IADLs, and leisure.   COMORBIDITIES has co-morbidities such as CTS  that affects occupational performance.  Patient will benefit from skilled OT to address above impairments and improve overall function.  MODIFICATION OR ASSISTANCE TO COMPLETE EVALUATION: No modification of tasks or assist necessary to complete an evaluation.  OT OCCUPATIONAL PROFILE AND HISTORY: Problem focused assessment: Including review of records relating to presenting problem.  CLINICAL DECISION MAKING: Moderate - several treatment options, min-mod task modification necessary  REHAB POTENTIAL: Fair time since onset, severe CTS  EVALUATION COMPLEXITY: Moderate      PLAN: OT FREQUENCY: 2x/week  OT DURATION: 8 weeks (beginning 11/19/21)   PLANNED INTERVENTIONS: self care/ADL training, therapeutic exercise, therapeutic activity, manual therapy, passive range of motion, functional mobility training, splinting, electrical stimulation, ultrasound, paraffin, fluidotherapy, moist heat, cryotherapy, contrast bath, patient/family education, coping strategies training, DME and/or AE instructions, and Re-evaluation  RECOMMENDED OTHER SERVICES: NONE at this time  CONSULTED AND AGREED WITH PLAN OF CARE: Patient and family member/caregiver  PLAN FOR NEXT SESSION:  paraffin, assess remaining LTG's (anticipate d/c by 01/04/22 - if not, will cancel 10/20 appt and d/c 10/24)    Hans Eden, OT 12/28/2021, 2:13 PM

## 2021-12-29 LAB — CUP PACEART REMOTE DEVICE CHECK
Date Time Interrogation Session: 20231002231438
Implantable Pulse Generator Implant Date: 20210505

## 2021-12-30 ENCOUNTER — Ambulatory Visit (INDEPENDENT_AMBULATORY_CARE_PROVIDER_SITE_OTHER): Payer: HMO

## 2021-12-30 ENCOUNTER — Encounter: Payer: PPO | Admitting: Occupational Therapy

## 2021-12-30 DIAGNOSIS — I639 Cerebral infarction, unspecified: Secondary | ICD-10-CM

## 2022-01-01 ENCOUNTER — Encounter (HOSPITAL_COMMUNITY): Payer: Self-pay | Admitting: *Deleted

## 2022-01-04 ENCOUNTER — Encounter: Payer: Self-pay | Admitting: Occupational Therapy

## 2022-01-04 ENCOUNTER — Ambulatory Visit: Payer: HMO | Admitting: Occupational Therapy

## 2022-01-04 DIAGNOSIS — M25631 Stiffness of right wrist, not elsewhere classified: Secondary | ICD-10-CM

## 2022-01-04 DIAGNOSIS — M25641 Stiffness of right hand, not elsewhere classified: Secondary | ICD-10-CM

## 2022-01-04 DIAGNOSIS — R278 Other lack of coordination: Secondary | ICD-10-CM

## 2022-01-04 DIAGNOSIS — M25531 Pain in right wrist: Secondary | ICD-10-CM

## 2022-01-04 NOTE — Therapy (Signed)
OUTPATIENT OCCUPATIONAL THERAPY ORTHO TREATMENT  Patient Name: [name] MRN: [id number] DOB:1945/11/12, 76 y.o., female Today's Date: 01/04/2022  PCP: Orpah Melter, MD REFERRING PROVIDER: Mordecai Rasmussen, MD    OT End of Session - 01/04/22 1409     Visit Number 9    Number of Visits 16    Date for OT Re-Evaluation 01/19/22    Authorization Type [employer]    Progress Note Due on Visit 10    OT Start Time 1400    OT Stop Time 1445    OT Time Calculation (min) 45 min    Activity Tolerance Patient tolerated treatment well    Behavior During Therapy WFL for tasks assessed/performed              Past Medical History:  Diagnosis Date   Actinic keratosis    Followed by Dr. Allyson Sabal   Anxiety    Diabetes mellitus, type II, insulin dependent ([place])    Associated with obesity: She takes Lantus, Victoza & Actos   Diplopia    Essential hypertension    GERD (gastroesophageal reflux disease)    Hyperlipidemia    Morbid obesity with BMI of 40.0-44.9, adult ([place])    Stroke ([place]) 2021   Past Surgical History:  Procedure Laterality Date   BREAST BIOPSY  1997   CARPAL TUNNEL RELEASE Right 11/12/2021   Procedure: CARPAL TUNNEL RELEASE;  Surgeon: Mordecai Rasmussen, MD;  Location: [facility];  Service: Orthopedics;  Laterality: Right;  pt knows to arrive at 6:15   LEFT HEART CATH AND CORONARY ANGIOGRAPHY N/A 01/05/2017   Procedure: LEFT HEART CATH AND CORONARY ANGIOGRAPHY;  Surgeon: Leonie Man, MD;  Location: [facility];  Service: Cardiovascular: Angiographically normal coronary arteries with a right dominant system.  Normal EF 55-65%.   LOOP RECORDER INSERTION N/A 08/01/2019   Procedure: LOOP RECORDER INSERTION;  Surgeon: Constance Haw, MD;  Location: [facility];  Service: Cardiovascular;  Laterality: N/A;   Patient Active Problem List   Diagnosis Date Noted   Paroxysmal atrial fibrillation ([place]) 08/08/2019   Secondary hypercoagulable  state ([place]) 08/08/2019   Acute cerebrovascular accident of cerebellum ([place]) 07/31/2019   GERD (gastroesophageal reflux disease)    Leukocytosis    Morbid obesity ([place]) 01/25/2018   Chest pain with low risk for cardiac etiology 12/31/2016   Essential hypertension 12/31/2016   Hyperlipidemia 12/31/2016   DM (diabetes mellitus), type 2 with complications ([place]) 48/18/5631    ONSET DATE: NEW referral 11/17/21, 10/20/2021 (referral date)   REFERRING DIAG: Z98.890 (ICD-10-CM) - S/P carpal tunnel release recently on 11/12/21 S52.531D (ICD-10-CM) - Closed Colles' fracture of right radius with routine healing, subsequent encounter (casted for 6 weeks)  **Resume OT following carpal tunnel release surgery   THERAPY DIAG:  Stiffness of right hand, not elsewhere classified  Stiffness of right wrist, not elsewhere classified  Other lack of coordination  Pain in right wrist  Rationale for Evaluation and Treatment Rehabilitation  SUBJECTIVE:   SUBJECTIVE STATEMENT: No pain today Pt accompanied by:  son  PERTINENT HISTORY: recent CTR 11/12/21, Rt distal radius fx 08/06/21 (casted 6 weeks), CTS, DM, HTN, A-fib, CVA 2021  PRECAUTIONS: Fall and Other: Loop recorder  WEIGHT BEARING RESTRICTIONS No  PAIN:  Are you having pain? No   FALLS: Has patient fallen in last 6 months? Yes. Number of falls 1  LIVING ENVIRONMENT: Lives with: lives with their family Pt lives in 1 story home w/ basement, 5  steps to enter house Has following equipment at home: Quad cane small base, Walker - 2 wheeled, shower chair, and Grab bars  PLOF: Independent  PATIENT GOALS hold things in Rt hand better to write and eat, improve movement and strength Rt hand  OBJECTIVE:   HAND DOMINANCE: Right   **BELOW TABLE: first numbers are 10/29/21 eval, second number is 11/19/21 re-eval  Active ROM Right eval Left eval  Shoulder flexion    Shoulder abduction    Shoulder adduction    Shoulder extension    Shoulder  internal rotation    Shoulder external rotation    Elbow flexion    Elbow extension    Wrist flexion 10/29/21 = 35,  11/19/21 = 38   Wrist extension 28 25   Wrist ulnar deviation    Wrist radial deviation    Wrist pronation WFLs   Wrist supination 75 75   (Blank rows = not tested)  Active ROM Right eval Left eval  Thumb MCP (0-60)    Thumb IP (0-80)    Thumb Radial abd/add (0-55)     Thumb Palmar abd/add (0-45) 65 70 70   Thumb Opposition to Small Finger     Index MCP (0-90)     Index PIP (0-100) 65 65    Index DIP (0-70)      Long MCP (0-90)      Long PIP (0-100) 70  70    Long DIP (0-70)      Ring MCP (0-90)      Ring PIP (0-100) 72  70    Ring DIP (0-70)      Little MCP (0-90)      Little PIP (0-100) 55 60    Little DIP (0-70)      (Blank rows = not tested)    HAND FUNCTION:  12/14/21: Grip Rt = 11.4 lbs, Lt = 39.4 lbs 10/29/21: Grip strength: Right: 4.8 lbs; Left: 33.5 lbs, Lateral pinch: Right: 3 lbs, Left: 11 lbs, and 3 point pinch: Right: 1 lbs, Left: 11 lbs   OBSERVATIONS: Pt also w/ arthritis in hands. Tightness more in PIP joints than MP joints of fingers Rt hand. Also noted thumb CMC instability (? From arthritis)    TODAY'S TREATMENT:   Fluidotherapy x 10 minutes for Rt hand to address pain and stiffness. No adverse reactions.   Practiced carrying 3 lb weight in Rt hand while ambulating. Progressed to carrying 4 lb weight Rt hand while ambulating in prep for carrying 1/2 gallon of milk.   Holding cup of water Rt hand and slowly pouring it out. Holding empty pot Rt hand and simulated pouring. Unable to tolerate any water in pot.   Pt shown weighted sup/pronation ex w/ hammer keeping wrist neutral. Pt instructed to add this to HEP, and to gradually increase wrist flex/ext to 2 lb weight.   Gripper set at level 1 resistance to pick up blocks Rt hand for sustained grip strength     HOME EXERCISE PROGRAM: 12/08/21: HEP including A/ROM, P/ROM to  hand and wrist, light putty  12/14/21: scar massage, gel pad wear and care, wrist strengthening HEP   GOALS:  SHORT TERM GOALS: Target date: 12/20/21  Independent with HEP for wrist and hand ROM and hand strengthening Baseline: Goal status: MET  2.  Pt to increase Rt wrist flex by 5* or greater and extension by 10* or greater Baseline: flex = 38*, ext = 25* Goal status: PARTIALLY MET (flex and ext = 40*)  3.  Pt to improve PIP flexion digits 2-5 Rt hand by 10* or greater Baseline: 65*, 70*, 72*, 60*  Goal status: PARTIALLY MET (75*, 78*, 80*, 70*)  4.  Pt to return to using Rt hand as dominant hand for BADLS including writing and eating Baseline: using Rt hand some for eating since CTR Goal status: MET  5.  Pt to oppose Rt thumb to 5th digit  Baseline: 3rd digit only Goal status: NOT MET (now to 4th digit)   6.  Pt to improve Rt grip strength to 8 lbs or greater Baseline: 4 lbs on 10/29/21 Goal status: MET (11.4 LBS)   LONG TERM GOALS: Target date: 01/19/22    Improve coordination Rt hand as evidenced by performing 9 hole peg test in 50 sec or less Baseline: 70.47 sec, 11/19/21 = 63 sec Goal status: MET (25.78 sec)   2.  Pt to improve grip strength Rt hand to 15 lbs  Baseline: 4 lbs on 10/29/21 Goal status: MET (16.3 LBS)   3.  Pt to improve lateral and tip pinch Rt hand by 3 lbs each Baseline: Lat = 3, 3 tip = 1 Goal status: NOT MET (Lat = 4, 3 tip = 2)  4.  Pt to verbalize understanding of pain reduction strategies for Rt hand and wrist Baseline:  Goal status: MET  5.  Pt to be able to lift and hold 3 lb object Rt hand w/o drops Baseline:  Goal status: MET  ASSESSMENT:  CLINICAL IMPRESSION: Patient progressing with Rt hand function and motion. Limited by arthritis. Pt has met 4/5 LTG's at this time.   PERFORMANCE DEFICITS in functional skills including ADLs, IADLs, coordination, dexterity, sensation, edema, ROM, strength, pain, flexibility, FMC, GMC, balance,  decreased knowledge of use of DME, and UE functional use.   IMPAIRMENTS are limiting patient from ADLs, IADLs, and leisure.   COMORBIDITIES has co-morbidities such as CTS  that affects occupational performance. Patient will benefit from skilled OT to address above impairments and improve overall function.  MODIFICATION OR ASSISTANCE TO COMPLETE EVALUATION: No modification of tasks or assist necessary to complete an evaluation.  OT OCCUPATIONAL PROFILE AND HISTORY: Problem focused assessment: Including review of records relating to presenting problem.  CLINICAL DECISION MAKING: Moderate - several treatment options, min-mod task modification necessary  REHAB POTENTIAL: Fair time since onset, severe CTS  EVALUATION COMPLEXITY: Moderate      PLAN: OT FREQUENCY: 2x/week  OT DURATION: 8 weeks (beginning 11/19/21)   PLANNED INTERVENTIONS: self care/ADL training, therapeutic exercise, therapeutic activity, manual therapy, passive range of motion, functional mobility training, splinting, electrical stimulation, ultrasound, paraffin, fluidotherapy, moist heat, cryotherapy, contrast bath, patient/family education, coping strategies training, DME and/or AE instructions, and Re-evaluation  RECOMMENDED OTHER SERVICES: NONE at this time  CONSULTED AND AGREED WITH PLAN OF CARE: Patient and family member/caregiver  PLAN  d/c OT   OCCUPATIONAL THERAPY DISCHARGE SUMMARY  Visits from Start of Care: 9   Current functional level related to goals / functional outcomes: SEE ABOVE   Remaining deficits: Stiffness at wrist and fingers Rt side Strength Rt hand   Education / Equipment: Splint wear and care, HEP's for ROM, coordination, strength   Patient agrees to discharge. Patient goals were partially met. Patient is being discharged due to maximized rehab potential. .     Hans Eden, OT 01/04/2022, 2:09 PM

## 2022-01-04 NOTE — Progress Notes (Signed)
[facility] Summary Report / Loop Recorder 

## 2022-01-13 ENCOUNTER — Encounter: Payer: HMO | Admitting: Occupational Therapy

## 2022-01-15 ENCOUNTER — Ambulatory Visit: Payer: HMO | Admitting: Rehabilitative and Restorative Service Providers"

## 2022-01-19 ENCOUNTER — Ambulatory Visit: Payer: HMO | Admitting: Occupational Therapy

## 2022-01-21 ENCOUNTER — Other Ambulatory Visit: Payer: Self-pay | Admitting: Orthopedic Surgery

## 2022-02-01 ENCOUNTER — Ambulatory Visit (INDEPENDENT_AMBULATORY_CARE_PROVIDER_SITE_OTHER): Payer: HMO

## 2022-02-01 DIAGNOSIS — I639 Cerebral infarction, unspecified: Secondary | ICD-10-CM

## 2022-02-01 LAB — CUP PACEART REMOTE DEVICE CHECK
Date Time Interrogation Session: 20231105230633
Implantable Pulse Generator Implant Date: 20210505

## 2022-02-10 ENCOUNTER — Telehealth: Payer: Self-pay | Admitting: Cardiology

## 2022-02-10 NOTE — Telephone Encounter (Signed)
Son called triage to report patient had rapid heart rate last night. This morning BP 162/107, 174/100, 146/54. I called patient. She stated that for the past 2-3 nights when she is lying in bed, she feels her heart racing, and it lasts 10-15 minutes. She denies SOB, chest pain, dizziness, lightheadedness. She does not hydrate, but said she will start drinking ore water. She denies any swelling. The blood pressures listed were before carvedilol. She will take it now (10:30) and call around noon to give BP result. Recommended that if she has a rapid heart rate with SOB or dizziness, she needs to have someone take her to the ED. She verbalized understanding.

## 2022-02-10 NOTE — Telephone Encounter (Signed)
ILR checked and did transmit data during pm hours yesterday evening. Appears during symptoms patient experienced AF w/ RVR with max VR 150 bpm. Most recent data available reports patient went back into NSR. Patient called and advised of findings and reports she feels fine now and has no complaints at this time. Advised I will forward to the AF clinic considering she had recent OV. Patient voiced understanding.

## 2022-02-10 NOTE — Telephone Encounter (Signed)
Patient c/o Palpitations:  High priority if patient c/o lightheadedness, shortness of breath, or chest pain  How long have you had palpitations/irregular HR/ Afib? Are you having the symptoms now?   No  Are you currently experiencing lightheadedness, SOB or CP?   No  Do you have a history of afib (atrial fibrillation) or irregular heart rhythm?   Yes  Have you checked your BP or HR? (document readings if available):   BP  162/107 (early am)         174/100 (45 mins later)  Are you experiencing any other symptoms?   No   Son stated this episode started last night.  Son stated patient said she felt like her heart was racing.  HR 146/54.

## 2022-02-10 NOTE — Telephone Encounter (Signed)
Pt heart rate currently 57 BP 129/86 - no change in medication - recommend office visit to discuss AAD. Pt agreeable as burden is starting increase. Pt appt made

## 2022-02-10 NOTE — Telephone Encounter (Signed)
Pt calling back to give bp after meds - 12:00pm - 166/63

## 2022-02-10 NOTE — Telephone Encounter (Signed)
Pt to call back with update of her heart rates once she gets home today. If HRs acceptable range per Adline Peals PA will increase coreg. Will await call back.

## 2022-02-10 NOTE — Telephone Encounter (Signed)
Spoke with patient who reported current BP as 166/63 (1 1/2 hours after bp med). Please advise. See notes for today 02/10/22.

## 2022-02-16 ENCOUNTER — Ambulatory Visit (HOSPITAL_COMMUNITY)
Admission: RE | Admit: 2022-02-16 | Discharge: 2022-02-16 | Disposition: A | Discharge status: Home / Self Care | Payer: HMO | Source: Ambulatory Visit | Attending: Physician Assistant | Admitting: Physician Assistant

## 2022-02-16 ENCOUNTER — Encounter (HOSPITAL_COMMUNITY): Payer: Self-pay | Admitting: Physician Assistant

## 2022-02-16 VITALS — BP 136/90 | HR 57 | Ht 62.0 in | Wt 237.2 lb

## 2022-02-16 DIAGNOSIS — I480 Paroxysmal atrial fibrillation: Secondary | ICD-10-CM | POA: Insufficient documentation

## 2022-02-16 DIAGNOSIS — Z794 Long term (current) use of insulin: Secondary | ICD-10-CM | POA: Diagnosis not present

## 2022-02-16 DIAGNOSIS — E119 Type 2 diabetes mellitus without complications: Secondary | ICD-10-CM | POA: Diagnosis not present

## 2022-02-16 DIAGNOSIS — I10 Essential (primary) hypertension: Secondary | ICD-10-CM | POA: Insufficient documentation

## 2022-02-16 DIAGNOSIS — R001 Bradycardia, unspecified: Secondary | ICD-10-CM | POA: Insufficient documentation

## 2022-02-16 DIAGNOSIS — Z8673 Personal history of transient ischemic attack (TIA), and cerebral infarction without residual deficits: Secondary | ICD-10-CM | POA: Diagnosis not present

## 2022-02-16 DIAGNOSIS — Z79899 Other long term (current) drug therapy: Secondary | ICD-10-CM | POA: Diagnosis not present

## 2022-02-16 DIAGNOSIS — Z7901 Long term (current) use of anticoagulants: Secondary | ICD-10-CM | POA: Insufficient documentation

## 2022-02-16 DIAGNOSIS — Z95818 Presence of other cardiac implants and grafts: Secondary | ICD-10-CM | POA: Insufficient documentation

## 2022-02-16 DIAGNOSIS — D6869 Other thrombophilia: Secondary | ICD-10-CM | POA: Diagnosis not present

## 2022-02-16 DIAGNOSIS — Z6841 Body Mass Index (BMI) 40.0 and over, adult: Secondary | ICD-10-CM | POA: Insufficient documentation

## 2022-02-16 DIAGNOSIS — R9431 Abnormal electrocardiogram [ECG] [EKG]: Secondary | ICD-10-CM | POA: Insufficient documentation

## 2022-02-16 DIAGNOSIS — E669 Obesity, unspecified: Secondary | ICD-10-CM | POA: Insufficient documentation

## 2022-02-16 DIAGNOSIS — Z713 Dietary counseling and surveillance: Secondary | ICD-10-CM | POA: Diagnosis not present

## 2022-02-16 MED ORDER — FLECAINIDE ACETATE 50 MG PO TABS: 50.0000 mg | ORAL_TABLET | Freq: Two times a day (BID) | ORAL | 3 refills | Status: DC

## 2022-02-16 NOTE — Patient Instructions (Signed)
Start Flecainide 50mg twice a day 

## 2022-02-16 NOTE — Progress Notes (Signed)
Primary Care Physician: Kerry Melter, [name] Primary Cardiologist: Dr Ellyn Hack Primary Electrophysiologist: Dr Curt Bears Referring Physician: Dr Evie Lacks [name] is a 76 y.o. female with a history of DM, HTN, GERD, HLD, prior CVA, and new onset paroxysmal atrial fibrillation who presents for follow up in the [facility]. 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 The device clinic received an alert for two, 4-minute episodes of afib on 08/06/19. Patient has a CHADS2VASC score of 6. She was unaware of her arrhythmia. She does state that since her stroke she has had issues with balance and nausea. She denies significant alcohol use or snoring.   On follow up today, patient reports that she has noted any increased burden of afib over the last several weeks. Her ILR shows a 0.6% afib burden which is up from previous. She is mostly aware of afib in the evening before going to sleep. There dose not appear to be specific triggers. No bleeding issues on anticoagulation.   Today, she denies symptoms of chest pain, shortness of breath, orthopnea, PND, lower extremity edema, presyncope, syncope, snoring, daytime somnolence, bleeding. The patient is tolerating medications without difficulties and is otherwise without complaint today.   Atrial Fibrillation Risk Factors:  she does not have symptoms or diagnosis of sleep apnea. she does not have a history of rheumatic fever. she does not have a history of alcohol use. The patient does not have a history of early familial atrial fibrillation or other arrhythmias.  she has a BMI of Body mass index is 43.38 kg/m.Marland Kitchen Filed Weights   02/16/22 1027  Weight: 107.6 kg    Family History  Problem Relation Age of Onset   Diabetes Mother    Hyperlipidemia Mother    Stroke Mother 66   Liver cancer Father    Hypertension Sister    Hyperlipidemia Sister    Diabetes Sister         On insulin   Depression Sister    Kidney disease Brother        After dialysis he had renal transplant   Diabetes Brother    Coronary artery disease Brother    Peripheral Artery Disease Brother    Stroke Brother 29   Bladder Cancer Brother    Diabetes Brother      Atrial Fibrillation Management history:  Previous antiarrhythmic drugs: none Previous cardioversions: none Previous ablations: none CHADS2VASC score: 6 Anticoagulation history: Eliquis   Past Medical History:  Diagnosis Date   Actinic keratosis    Followed by Dr. Allyson Johnson   Anxiety    Diabetes mellitus, type II, insulin dependent ([name])    Associated with obesity: She takes Lantus, Victoza & Actos   Diplopia    Essential hypertension    GERD (gastroesophageal reflux disease)    Hyperlipidemia    Morbid obesity with BMI of 40.0-44.9, adult ([name])    Stroke ([name]) 2021   Past Surgical History:  Procedure Laterality Date   BREAST BIOPSY  1997   CARPAL TUNNEL RELEASE Right 11/12/2021   Procedure: CARPAL TUNNEL RELEASE;  Surgeon: [name], [name];  Location: [facility];  Service: Orthopedics;  Laterality: Right;  pt knows to arrive at 6:15   LEFT HEART CATH AND CORONARY ANGIOGRAPHY N/A 01/05/2017   Procedure: LEFT HEART CATH AND CORONARY ANGIOGRAPHY;  Surgeon: Leonie Man, [name];  Location: [facility];  Service: Cardiovascular: Angiographically normal coronary arteries with a  right dominant system.  Normal EF 55-65%.   LOOP RECORDER INSERTION N/A 08/01/2019   Procedure: LOOP RECORDER INSERTION;  Surgeon: Constance Haw, [name];  Location: [facility];  Service: Cardiovascular;  Laterality: N/A;    Current Outpatient Medications  Medication Sig Dispense Refill   acetaminophen (TYLENOL) 650 MG CR tablet Take 650 mg by mouth every 8 (eight) hours as needed for pain.     alendronate (FOSAMAX) 70 MG tablet Take 70 mg by mouth once a week. Take with a full glass of water on an empty stomach.      ALPRAZolam (XANAX) 0.25 MG tablet Take 0.25 mg by mouth 3 (three) times daily as needed.     atorvastatin (LIPITOR) 40 MG tablet Take 1 tablet (40 mg total) by mouth daily. 30 tablet 2   blood glucose meter kit and supplies KIT Dispense based on patient and insurance preference. Use up to four times daily as directed. (FOR ICD-9 250.00, 250.01). 1 each 0   carvedilol (COREG) 12.5 MG tablet Take 1 tablet (12.5 mg total) by mouth 2 (two) times daily. 180 tablet 2   diphenhydramine-acetaminophen (TYLENOL PM) 25-500 MG TABS tablet Take 2 tablets by mouth at bedtime.     ELIQUIS 5 MG TABS tablet TAKE ONE TABLET BY MOUTH EVERY MORNING and TAKE ONE TABLET BY MOUTH EVERY EVENING 180 tablet 2   flecainide (TAMBOCOR) 50 MG tablet Take 1 tablet (50 mg total) by mouth 2 (two) times daily. 60 tablet 3   gabapentin (NEURONTIN) 100 MG capsule TAKE ONE CAPSULE BY MOUTH three times A DAY 90 capsule 0   Insulin Syringe-Needle U-100 (RELION INSULIN SYR .3CC/29G) 29G X 1/2" 0.3 ML MISC 1 each by Does not apply route in the morning and at bedtime. 100 each 0   Lancets (ONETOUCH DELICA PLUS [id number]) MISC USE 1 LANCET TO CHECK GLUCOSE UP TO 4 TIMES DAILY     LANTUS SOLOSTAR 100 UNIT/ML Solostar Pen Inject 30 Units into the skin in the morning.     lisinopril-hydrochlorothiazide (ZESTORETIC) 20-25 MG tablet Take 1 tablet by mouth daily.     nitroGLYCERIN (NITROSTAT) 0.4 MG SL tablet Place 1 tablet (0.4 mg total) under the tongue every 5 (five) minutes as needed for chest pain. 25 tablet 5   ONETOUCH VERIO test strip USE 1 STRIP TO CHECK GLUCOSE UP TO 4 TIMES DAILY AS DIRECTED     pioglitazone (ACTOS) 45 MG tablet Take 45 mg by mouth daily.     RELION PEN NEEDLES 31G X 6 MM MISC USE 1 IN THE MORNING AND AT BEDTIME     traMADol (ULTRAM) 50 MG tablet Take 1 tablet (50 mg total) by mouth every 12 (twelve) hours as needed. 20 tablet 0   VICTOZA 18 MG/3ML SOPN Inject 1.8 mg into the skin daily.     No current  facility-administered medications for this encounter.    Allergies  Allergen Reactions   Livalo [Pitavastatin]     MYALGIAS    Sulfa Antibiotics Itching    Itching and red spots.    Social History   Socioeconomic History   Marital status: Widowed    Spouse name: Not on file   Number of children: 3   Years of education: 73   Highest education level: Not on file  Occupational History    Comment: Retired  Tobacco Use   Smoking status: Never   Smokeless tobacco: Never   Tobacco comments:    Never smoke 12/16/21  Substance and Sexual Activity   Alcohol use: No   Drug use: Never   Sexual activity: Not Currently  Other Topics Concern   Not on file  Social History Narrative   She is a widowed mother of 3 with 5 grandchildren. She lives with one of her daughters and grandson. She is a coming by her sister. She tries to walk, but does not do it regularly.   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   Coffee every morning, tea rare   Right handed    Social Determinants of Health   Financial Resource Strain: Not on file  Food Insecurity: Not on file  Transportation Needs: Not on file  Physical Activity: Not on file  Stress: Not on file  Social Connections: Not on file  Intimate Partner Violence: Not on file     ROS- All systems are reviewed and negative except as per the HPI above.  Physical Exam: Vitals:   02/16/22 1027  BP: (!) 136/90  Pulse: (!) 57  Weight: 107.6 kg  Height: _0  (1.575 m)     GEN- The patient is a well appearing obese female, alert and oriented x 3 today.   HEENT-head normocephalic, atraumatic, sclera clear, conjunctiva pink, hearing intact, trachea midline. Lungs- Clear to ausculation bilaterally, normal work of breathing Heart- Regular rate and rhythm, no murmurs, rubs or gallops  GI- soft, NT, ND, + BS Extremities- no clubbing, cyanosis, or edema MS- no significant deformity or atrophy Skin- no rash or lesion Psych-  euthymic mood, full affect Neuro- strength and sensation are intact   Wt Readings from Last 3 Encounters:  02/16/22 107.6 kg  12/22/21 107.5 kg  12/16/21 107.6 kg    EKG today demonstrates  SB Vent. rate 57 BPM PR interval 168 ms QRS duration 84 ms QT/QTcB 436/424 ms  Echo 08/01/19 demonstrated  1. Left ventricular ejection fraction, by estimation, is 60 to 65%. The  left ventricle has normal function. The left ventricle has no regional  wall motion abnormalities. Left ventricular diastolic parameters are  consistent with Grade I diastolic dysfunction (impaired relaxation). Elevated left atrial pressure.   2. Right ventricular systolic function is normal. The right ventricular  size is normal. There is normal pulmonary artery systolic pressure. The estimated right ventricular systolic pressure is 43.8 mmHg.   3. The mitral valve is normal in structure. Trivial mitral valve  regurgitation. No evidence of mitral stenosis.   4. Tricuspid valve regurgitation is moderate.   5. The aortic valve is normal in structure. Aortic valve regurgitation is  not visualized. No aortic stenosis is present.   6. The inferior vena cava is normal in size with greater than 50%  respiratory variability, suggesting right atrial pressure of 3 mmHg.   Conclusion(s)/Recommendation(s): No intracardiac source of embolism detected on this transthoracic study. A transesophageal echocardiogram is recommended to exclude cardiac source of embolism if clinically indicated.   Epic records are reviewed at length today  CHA2DS2-VASc Score = 7  The patient's score is based upon: CHF History: 0 HTN History: 1 Diabetes History: 1 Stroke History: 2 Vascular Disease History: 0 Age Score: 2 Gender Score: 1        ASSESSMENT AND PLAN: 1. Paroxysmal Atrial Fibrillation (ICD10:  I48.0) The patient's CHA2DS2-VASc score is 7, indicating a 11.2% annual risk of stroke.   AF burden on ILR is 0.6% but she does have  frequent, brief episodes that are symptomatic. We discussed rhythm control options  today.  Will start flecainide 50 mg BID. Echo reviewed, no CAD by [place] in 2018. Continue Eliquis 5 mg BID Continue Coreg 12.5 mg BID  2. Secondary Hypercoagulable State (ICD10:  D68.69) The patient is at significant risk for stroke/thromboembolism based upon her CHA2DS2-VASc Score of 7.  Continue Apixaban (Eliquis).   3. Obesity Body mass index is 43.38 kg/m. Lifestyle modification was discussed and encouraged including regular physical activity and weight reduction.  4. HTN Stable, no changes today.   Follow up in the AF clinic next week for ECG.    [name] [address] [place], [place] [address] [phone] 02/16/2022 1:22 PM

## 2022-02-22 ENCOUNTER — Ambulatory Visit (HOSPITAL_COMMUNITY)
Admission: RE | Admit: 2022-02-22 | Discharge: 2022-02-22 | Disposition: A | Discharge status: Home / Self Care | Payer: HMO | Source: Ambulatory Visit | Attending: Physician Assistant | Admitting: Physician Assistant

## 2022-02-22 VITALS — HR 54

## 2022-02-22 DIAGNOSIS — I480 Paroxysmal atrial fibrillation: Secondary | ICD-10-CM | POA: Insufficient documentation

## 2022-02-22 DIAGNOSIS — R001 Bradycardia, unspecified: Secondary | ICD-10-CM | POA: Diagnosis not present

## 2022-02-22 DIAGNOSIS — Z79899 Other long term (current) drug therapy: Secondary | ICD-10-CM | POA: Insufficient documentation

## 2022-02-22 NOTE — Progress Notes (Signed)
Patient returns for ECG after starting flecainide. ECG shows SB Vent. rate 54 BPM PR interval 188 ms QRS duration 88 ms QT/QTcB 436/413 ms  She has not had any palpitations since starting the medication. ILR does not show any interim afib. Continue flecainide 50 mg BID. F/u in the AF clinic in 3 months.

## 2022-03-02 NOTE — Progress Notes (Signed)
[facility] Summary Report / Loop Recorder 

## 2022-03-08 ENCOUNTER — Ambulatory Visit (INDEPENDENT_AMBULATORY_CARE_PROVIDER_SITE_OTHER): Payer: HMO

## 2022-03-08 DIAGNOSIS — I480 Paroxysmal atrial fibrillation: Secondary | ICD-10-CM

## 2022-03-08 LAB — CUP PACEART REMOTE DEVICE CHECK
Date Time Interrogation Session: 20231210231118
Implantable Pulse Generator Implant Date: 20210505

## 2022-04-12 ENCOUNTER — Ambulatory Visit (INDEPENDENT_AMBULATORY_CARE_PROVIDER_SITE_OTHER): Payer: HMO

## 2022-04-12 DIAGNOSIS — I639 Cerebral infarction, unspecified: Secondary | ICD-10-CM

## 2022-04-12 NOTE — Progress Notes (Signed)
[facility] Summary Report / Loop Recorder

## 2022-04-13 LAB — CUP PACEART REMOTE DEVICE CHECK
Date Time Interrogation Session: 20240112230857
Implantable Pulse Generator Implant Date: 20210505

## 2022-04-27 DIAGNOSIS — E1142 Type 2 diabetes mellitus with diabetic polyneuropathy: Secondary | ICD-10-CM | POA: Diagnosis not present

## 2022-04-27 DIAGNOSIS — K219 Gastro-esophageal reflux disease without esophagitis: Secondary | ICD-10-CM | POA: Diagnosis not present

## 2022-04-27 DIAGNOSIS — E782 Mixed hyperlipidemia: Secondary | ICD-10-CM | POA: Diagnosis not present

## 2022-04-27 DIAGNOSIS — N1831 Chronic kidney disease, stage 3a: Secondary | ICD-10-CM | POA: Diagnosis not present

## 2022-04-27 DIAGNOSIS — I480 Paroxysmal atrial fibrillation: Secondary | ICD-10-CM | POA: Diagnosis not present

## 2022-04-27 DIAGNOSIS — M810 Age-related osteoporosis without current pathological fracture: Secondary | ICD-10-CM | POA: Diagnosis not present

## 2022-04-27 DIAGNOSIS — I10 Essential (primary) hypertension: Secondary | ICD-10-CM | POA: Diagnosis not present

## 2022-05-16 LAB — CUP PACEART REMOTE DEVICE CHECK
Date Time Interrogation Session: 20240214230951
Implantable Pulse Generator Implant Date: 20210505

## 2022-05-17 ENCOUNTER — Ambulatory Visit: Payer: HMO

## 2022-05-17 DIAGNOSIS — I639 Cerebral infarction, unspecified: Secondary | ICD-10-CM

## 2022-05-24 NOTE — Progress Notes (Signed)
[facility] Summary Report / Loop Recorder 

## 2022-05-25 ENCOUNTER — Encounter (HOSPITAL_COMMUNITY): Payer: Self-pay | Admitting: Physician Assistant

## 2022-05-25 ENCOUNTER — Ambulatory Visit (HOSPITAL_COMMUNITY)
Admission: RE | Admit: 2022-05-25 | Discharge: 2022-05-25 | Disposition: A | Discharge status: Home / Self Care | Payer: HMO | Source: Ambulatory Visit | Attending: Physician Assistant | Admitting: Physician Assistant

## 2022-05-25 VITALS — BP 128/60 | HR 55 | Ht 62.0 in | Wt 238.8 lb

## 2022-05-25 DIAGNOSIS — Z95818 Presence of other cardiac implants and grafts: Secondary | ICD-10-CM | POA: Diagnosis not present

## 2022-05-25 DIAGNOSIS — E119 Type 2 diabetes mellitus without complications: Secondary | ICD-10-CM | POA: Diagnosis not present

## 2022-05-25 DIAGNOSIS — Z794 Long term (current) use of insulin: Secondary | ICD-10-CM | POA: Diagnosis not present

## 2022-05-25 DIAGNOSIS — Z8673 Personal history of transient ischemic attack (TIA), and cerebral infarction without residual deficits: Secondary | ICD-10-CM | POA: Diagnosis not present

## 2022-05-25 DIAGNOSIS — Z7985 Long-term (current) use of injectable non-insulin antidiabetic drugs: Secondary | ICD-10-CM | POA: Insufficient documentation

## 2022-05-25 DIAGNOSIS — Z79899 Other long term (current) drug therapy: Secondary | ICD-10-CM | POA: Insufficient documentation

## 2022-05-25 DIAGNOSIS — Z6841 Body Mass Index (BMI) 40.0 and over, adult: Secondary | ICD-10-CM | POA: Insufficient documentation

## 2022-05-25 DIAGNOSIS — Z713 Dietary counseling and surveillance: Secondary | ICD-10-CM | POA: Insufficient documentation

## 2022-05-25 DIAGNOSIS — R001 Bradycardia, unspecified: Secondary | ICD-10-CM | POA: Insufficient documentation

## 2022-05-25 DIAGNOSIS — D6869 Other thrombophilia: Secondary | ICD-10-CM | POA: Diagnosis not present

## 2022-05-25 DIAGNOSIS — I10 Essential (primary) hypertension: Secondary | ICD-10-CM | POA: Diagnosis not present

## 2022-05-25 DIAGNOSIS — Z7984 Long term (current) use of oral hypoglycemic drugs: Secondary | ICD-10-CM | POA: Insufficient documentation

## 2022-05-25 DIAGNOSIS — Z7901 Long term (current) use of anticoagulants: Secondary | ICD-10-CM | POA: Insufficient documentation

## 2022-05-25 DIAGNOSIS — E669 Obesity, unspecified: Secondary | ICD-10-CM | POA: Diagnosis not present

## 2022-05-25 DIAGNOSIS — I480 Paroxysmal atrial fibrillation: Secondary | ICD-10-CM | POA: Diagnosis not present

## 2022-05-25 NOTE — Progress Notes (Signed)
Primary Care Physician: Orpah Melter, MD Primary Cardiologist: Dr Ellyn Hack Primary Electrophysiologist: Dr Curt Bears Referring Physician: Dr Evie Lacks [name] is a 77 y.o. female with a history of DM, HTN, GERD, HLD, prior CVA, and new onset paroxysmal atrial fibrillation who presents for follow up in the [facility]. 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 The device clinic received an alert for two, 4-minute episodes of afib on 08/06/19. Patient has a CHADS2VASC score of 6. She was unaware of her arrhythmia. She does state that since her stroke she has had issues with balance and nausea. She denies significant alcohol use or snoring. Started on flecainide 01/2022 for increased afib burden.   On follow up today, patient reports that she has done well since her last visit. Her ILR shows 0.8% burden with an episode lasting several hours on 04/11/22. Overall, her symptoms have improved on flecainide. No bleeding issues on anticoagulation. She was recently changed to [facility] for her diabetes.   Today, she denies symptoms of palpitations, chest pain, shortness of breath, orthopnea, PND, lower extremity edema, presyncope, syncope, snoring, daytime somnolence, bleeding. The patient is tolerating medications without difficulties and is otherwise without complaint today.   Atrial Fibrillation Risk Factors:  she does not have symptoms or diagnosis of sleep apnea. she does not have a history of rheumatic fever. she does not have a history of alcohol use. The patient does not have a history of early familial atrial fibrillation or other arrhythmias.  she has a BMI of Body mass index is 43.68 kg/m.Marland Kitchen Filed Weights   05/25/22 0827  Weight: 108.3 kg    Family History  Problem Relation Age of Onset   Diabetes Mother    Hyperlipidemia Mother    Stroke Mother 32   Liver cancer Father    Hypertension  Sister    Hyperlipidemia Sister    Diabetes Sister        On insulin   Depression Sister    Kidney disease Brother        After dialysis he had renal transplant   Diabetes Brother    Coronary artery disease Brother    Peripheral Artery Disease Brother    Stroke Brother 61   Bladder Cancer Brother    Diabetes Brother      Atrial Fibrillation Management history:  Previous antiarrhythmic drugs: flecainide  Previous cardioversions: none Previous ablations: none CHADS2VASC score: 6 Anticoagulation history: Eliquis   Past Medical History:  Diagnosis Date   Actinic keratosis    Followed by Dr. Allyson Sabal   Anxiety    Diabetes mellitus, type II, insulin dependent ([place])    Associated with obesity: She takes Lantus, Victoza & Actos   Diplopia    Essential hypertension    GERD (gastroesophageal reflux disease)    Hyperlipidemia    Morbid obesity with BMI of 40.0-44.9, adult ([place])    Stroke ([place]) 2021   Past Surgical History:  Procedure Laterality Date   BREAST BIOPSY  1997   CARPAL TUNNEL RELEASE Right 11/12/2021   Procedure: CARPAL TUNNEL RELEASE;  Surgeon: Mordecai Rasmussen, MD;  Location: [facility];  Service: Orthopedics;  Laterality: Right;  pt knows to arrive at 6:15   LEFT HEART CATH AND CORONARY ANGIOGRAPHY N/A 01/05/2017   Procedure: LEFT HEART CATH AND CORONARY ANGIOGRAPHY;  Surgeon: Leonie Man, MD;  Location: [facility];  Service: Cardiovascular: Angiographically normal coronary arteries with  a right dominant system.  Normal EF 55-65%.   LOOP RECORDER INSERTION N/A 08/01/2019   Procedure: LOOP RECORDER INSERTION;  Surgeon: Constance Haw, MD;  Location: [facility];  Service: Cardiovascular;  Laterality: N/A;    Current Outpatient Medications  Medication Sig Dispense Refill   acetaminophen (TYLENOL) 650 MG CR tablet Take 650 mg by mouth every 8 (eight) hours as needed for pain.     alendronate (FOSAMAX) 70 MG tablet Take 70 mg by mouth once a week.  Take with a full glass of water on an empty stomach.     ALPRAZolam (XANAX) 0.25 MG tablet Take 0.25 mg by mouth 3 (three) times daily as needed.     atorvastatin (LIPITOR) 40 MG tablet Take 1 tablet (40 mg total) by mouth daily. 30 tablet 2   blood glucose meter kit and supplies KIT Dispense based on patient and insurance preference. Use up to four times daily as directed. (FOR ICD-9 250.00, 250.01). 1 each 0   carvedilol (COREG) 12.5 MG tablet Take 1 tablet (12.5 mg total) by mouth 2 (two) times daily. 180 tablet 2   diphenhydramine-acetaminophen (TYLENOL PM) 25-500 MG TABS tablet Take 2 tablets by mouth at bedtime.     ELIQUIS 5 MG TABS tablet TAKE ONE TABLET BY MOUTH EVERY MORNING and TAKE ONE TABLET BY MOUTH EVERY EVENING 180 tablet 2   flecainide (TAMBOCOR) 50 MG tablet Take 1 tablet (50 mg total) by mouth 2 (two) times daily. 60 tablet 3   gabapentin (NEURONTIN) 100 MG capsule TAKE ONE CAPSULE BY MOUTH three times A DAY 90 capsule 0   Insulin Syringe-Needle U-100 (RELION INSULIN SYR .3CC/29G) 29G X 1/2" 0.3 ML MISC 1 each by Does not apply route in the morning and at bedtime. 100 each 0   Lancets (ONETOUCH DELICA PLUS [id number]) MISC USE 1 LANCET TO CHECK GLUCOSE UP TO 4 TIMES DAILY     LANTUS SOLOSTAR 100 UNIT/ML Solostar Pen Inject 30 Units into the skin in the morning.     lisinopril-hydrochlorothiazide (ZESTORETIC) 20-25 MG tablet Take 1 tablet by mouth daily.     ONETOUCH VERIO test strip USE 1 STRIP TO CHECK GLUCOSE UP TO 4 TIMES DAILY AS DIRECTED     pioglitazone (ACTOS) 45 MG tablet Take 45 mg by mouth daily.     RELION PEN NEEDLES 31G X 6 MM MISC USE 1 IN THE MORNING AND AT BEDTIME     tirzepatide (MOUNJARO) 5 MG/0.5ML Pen Inject 5 mg into the skin once weekly for 28 days     traMADol (ULTRAM) 50 MG tablet Take 1 tablet (50 mg total) by mouth every 12 (twelve) hours as needed. 20 tablet 0   nitroGLYCERIN (NITROSTAT) 0.4 MG SL tablet Place 1 tablet (0.4 mg total) under the tongue  every 5 (five) minutes as needed for chest pain. 25 tablet 5   No current facility-administered medications for this encounter.    Allergies  Allergen Reactions   Livalo [Pitavastatin]     MYALGIAS    Sulfa Antibiotics Itching    Itching and red spots.    Social History   Socioeconomic History   Marital status: Widowed    Spouse name: Not on file   Number of children: 3   Years of education: 81   Highest education level: Not on file  Occupational History    Comment: Retired  Tobacco Use   Smoking status: Never   Smokeless tobacco: Never   Tobacco comments:  Never smoke 12/16/21  Substance and Sexual Activity   Alcohol use: No   Drug use: Never   Sexual activity: Not Currently  Other Topics Concern   Not on file  Social History Narrative   She is a widowed mother of 3 with 5 grandchildren. She lives with one of her daughters and grandson. She is a coming by her sister. She tries to walk, but does not do it regularly.   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   Coffee every morning, tea rare   Right handed    Social Determinants of Health   Financial Resource Strain: Not on file  Food Insecurity: Not on file  Transportation Needs: Not on file  Physical Activity: Not on file  Stress: Not on file  Social Connections: Not on file  Intimate Partner Violence: Not on file     ROS- All systems are reviewed and negative except as per the HPI above.  Physical Exam: Vitals:   05/25/22 0827  BP: 128/60  Pulse: (!) 55  Weight: 108.3 kg  Height: '5\' 2"'$  (1.575 m)     GEN- The patient is a well appearing obese female, alert and oriented x 3 today.   HEENT-head normocephalic, atraumatic, sclera clear, conjunctiva pink, hearing intact, trachea midline. Lungs- Clear to ausculation bilaterally, normal work of breathing Heart- Regular rate and rhythm, no murmurs, rubs or gallops  GI- soft, NT, ND, + BS Extremities- no clubbing, cyanosis, or edema MS-  no significant deformity or atrophy Skin- no rash or lesion Psych- euthymic mood, full affect Neuro- strength and sensation are intact   Wt Readings from Last 3 Encounters:  05/25/22 108.3 kg  02/16/22 107.6 kg  12/22/21 107.5 kg    EKG today demonstrates  SB Vent. rate 55 BPM PR interval 196 ms QRS duration 86 ms QT/QTcB 440/420 ms  Echo 08/01/19 demonstrated  1. Left ventricular ejection fraction, by estimation, is 60 to 65%. The  left ventricle has normal function. The left ventricle has no regional  wall motion abnormalities. Left ventricular diastolic parameters are  consistent with Grade I diastolic dysfunction (impaired relaxation). Elevated left atrial pressure.   2. Right ventricular systolic function is normal. The right ventricular  size is normal. There is normal pulmonary artery systolic pressure. The estimated right ventricular systolic pressure is [id number] mmHg.   3. The mitral valve is normal in structure. Trivial mitral valve  regurgitation. No evidence of mitral stenosis.   4. Tricuspid valve regurgitation is moderate.   5. The aortic valve is normal in structure. Aortic valve regurgitation is  not visualized. No aortic stenosis is present.   6. The inferior vena cava is normal in size with greater than 50%  respiratory variability, suggesting right atrial pressure of 3 mmHg.   Conclusion(s)/Recommendation(s): No intracardiac source of embolism detected on this transthoracic study. A transesophageal echocardiogram is recommended to exclude cardiac source of embolism if clinically indicated.   Epic records are reviewed at length today  CHA2DS2-VASc Score = 7  The patient's score is based upon: CHF History: 0 HTN History: 1 Diabetes History: 1 Stroke History: 2 Vascular Disease History: 0 Age Score: 2 Gender Score: 1        ASSESSMENT AND PLAN: 1. Paroxysmal Atrial Fibrillation (ICD10:  I48.0) The patient's CHA2DS2-VASc score is 7, indicating a 11.2%  annual risk of stroke.   AF burden on ILR is 0.8%  Symptomatically patient improved. If her burden increases or she has an  increase in symptoms could up titrate the dose of flecainide.  Continue flecainide 50 mg BID Continue Eliquis 5 mg BID Continue Coreg 12.5 mg BID  2. Secondary Hypercoagulable State (ICD10:  D68.69) The patient is at significant risk for stroke/thromboembolism based upon her CHA2DS2-VASc Score of 7.  Continue Apixaban (Eliquis).   3. Obesity Body mass index is 43.68 kg/m. Lifestyle modification was discussed and encouraged including regular physical activity and weight reduction. Hopefully, Darcel Bayley will help her lose weight.   4. HTN Stable, no changes today.    Follow up in the AF clinic in 6 months. Follow up with Dr Ellyn Hack per recall.    [facility] [address] [place], [place] [address] [phone] 05/25/2022 8:57 AM

## 2022-06-11 ENCOUNTER — Other Ambulatory Visit (HOSPITAL_COMMUNITY): Payer: Self-pay

## 2022-06-12 ENCOUNTER — Other Ambulatory Visit (HOSPITAL_COMMUNITY): Payer: Self-pay

## 2022-06-12 MED ORDER — MOUNJARO 7.5 MG/0.5ML ~~LOC~~ SOAJ
7.5000 mg | SUBCUTANEOUS | 0 refills | Status: DC
  Filled 2022-06-12: qty 2, 28d supply, fill #0

## 2022-06-14 ENCOUNTER — Other Ambulatory Visit (HOSPITAL_COMMUNITY): Payer: Self-pay

## 2022-06-14 DIAGNOSIS — Z6841 Body Mass Index (BMI) 40.0 and over, adult: Secondary | ICD-10-CM | POA: Diagnosis not present

## 2022-06-14 DIAGNOSIS — N1831 Chronic kidney disease, stage 3a: Secondary | ICD-10-CM | POA: Diagnosis not present

## 2022-06-14 DIAGNOSIS — I10 Essential (primary) hypertension: Secondary | ICD-10-CM | POA: Diagnosis not present

## 2022-06-14 DIAGNOSIS — E782 Mixed hyperlipidemia: Secondary | ICD-10-CM | POA: Diagnosis not present

## 2022-06-14 DIAGNOSIS — I6930 Unspecified sequelae of cerebral infarction: Secondary | ICD-10-CM | POA: Diagnosis not present

## 2022-06-14 DIAGNOSIS — E1142 Type 2 diabetes mellitus with diabetic polyneuropathy: Secondary | ICD-10-CM | POA: Diagnosis not present

## 2022-06-14 DIAGNOSIS — Z794 Long term (current) use of insulin: Secondary | ICD-10-CM | POA: Diagnosis not present

## 2022-06-14 DIAGNOSIS — E1122 Type 2 diabetes mellitus with diabetic chronic kidney disease: Secondary | ICD-10-CM | POA: Diagnosis not present

## 2022-06-14 DIAGNOSIS — D6869 Other thrombophilia: Secondary | ICD-10-CM | POA: Diagnosis not present

## 2022-06-14 DIAGNOSIS — I480 Paroxysmal atrial fibrillation: Secondary | ICD-10-CM | POA: Diagnosis not present

## 2022-06-18 DIAGNOSIS — H353131 Nonexudative age-related macular degeneration, bilateral, early dry stage: Secondary | ICD-10-CM | POA: Diagnosis not present

## 2022-06-18 DIAGNOSIS — H02831 Dermatochalasis of right upper eyelid: Secondary | ICD-10-CM | POA: Diagnosis not present

## 2022-06-18 DIAGNOSIS — H02834 Dermatochalasis of left upper eyelid: Secondary | ICD-10-CM | POA: Diagnosis not present

## 2022-06-18 DIAGNOSIS — E113393 Type 2 diabetes mellitus with moderate nonproliferative diabetic retinopathy without macular edema, bilateral: Secondary | ICD-10-CM | POA: Diagnosis not present

## 2022-06-18 DIAGNOSIS — H2513 Age-related nuclear cataract, bilateral: Secondary | ICD-10-CM | POA: Diagnosis not present

## 2022-06-19 LAB — CUP PACEART REMOTE DEVICE CHECK
Date Time Interrogation Session: 20240318230752
Implantable Pulse Generator Implant Date: 20210505

## 2022-06-20 ENCOUNTER — Other Ambulatory Visit (HOSPITAL_COMMUNITY): Payer: Self-pay | Admitting: Physician Assistant

## 2022-06-21 ENCOUNTER — Ambulatory Visit (INDEPENDENT_AMBULATORY_CARE_PROVIDER_SITE_OTHER): Payer: HMO

## 2022-06-21 DIAGNOSIS — I639 Cerebral infarction, unspecified: Secondary | ICD-10-CM | POA: Diagnosis not present

## 2022-06-28 NOTE — Progress Notes (Signed)
[facility] Summary Report / Loop Recorder 

## 2022-07-23 DIAGNOSIS — R55 Syncope and collapse: Secondary | ICD-10-CM | POA: Diagnosis not present

## 2022-07-26 ENCOUNTER — Ambulatory Visit (INDEPENDENT_AMBULATORY_CARE_PROVIDER_SITE_OTHER): Payer: HMO

## 2022-07-26 DIAGNOSIS — I639 Cerebral infarction, unspecified: Secondary | ICD-10-CM

## 2022-07-26 LAB — CUP PACEART REMOTE DEVICE CHECK
Date Time Interrogation Session: 20240428230612
Implantable Pulse Generator Implant Date: 20210505

## 2022-07-27 ENCOUNTER — Other Ambulatory Visit: Payer: Self-pay | Admitting: Physician Assistant

## 2022-07-30 NOTE — Progress Notes (Signed)
[facility] Summary Report / Loop Recorder 

## 2022-08-17 ENCOUNTER — Encounter (HOSPITAL_COMMUNITY): Payer: Self-pay | Admitting: Family Medicine

## 2022-08-17 ENCOUNTER — Other Ambulatory Visit (HOSPITAL_COMMUNITY): Payer: Self-pay | Admitting: Family Medicine

## 2022-08-17 DIAGNOSIS — W19XXXS Unspecified fall, sequela: Secondary | ICD-10-CM

## 2022-08-18 ENCOUNTER — Telehealth: Payer: Self-pay | Admitting: Cardiology

## 2022-08-18 NOTE — Telephone Encounter (Signed)
Pt son states pt is having a Ct of her head ordered by her PCP and was told to reach out to Dr. Herbie Baltimore to see if there is anything she needs to do prior, like holding any medications. Please advise.

## 2022-08-18 NOTE — Telephone Encounter (Signed)
Spoke with pt son, she is having a CT of the head because she has not been feeling right. The medical doctor told them to call us about anything regarding medications prior to the CT scan. Explained to son, to my knowledge there are not restrictions prior to a CT of the head but to make sure they ask the person from radiology that calls them to schedule about any instructions.

## 2022-08-26 NOTE — Progress Notes (Signed)
[facility] Summary Report / Loop Recorder 

## 2022-08-30 ENCOUNTER — Ambulatory Visit (INDEPENDENT_AMBULATORY_CARE_PROVIDER_SITE_OTHER): Payer: HMO

## 2022-08-30 DIAGNOSIS — I480 Paroxysmal atrial fibrillation: Secondary | ICD-10-CM

## 2022-08-30 LAB — CUP PACEART REMOTE DEVICE CHECK
Date Time Interrogation Session: 20240602230518
Implantable Pulse Generator Implant Date: 20210505

## 2022-09-09 ENCOUNTER — Other Ambulatory Visit (HOSPITAL_COMMUNITY): Payer: Self-pay | Admitting: Physician Assistant

## 2022-09-14 DIAGNOSIS — E1142 Type 2 diabetes mellitus with diabetic polyneuropathy: Secondary | ICD-10-CM | POA: Diagnosis not present

## 2022-09-21 NOTE — Progress Notes (Signed)
[facility] Summary Report / Loop Recorder 

## 2022-10-01 ENCOUNTER — Ambulatory Visit (INDEPENDENT_AMBULATORY_CARE_PROVIDER_SITE_OTHER): Payer: PPO

## 2022-10-01 DIAGNOSIS — I639 Cerebral infarction, unspecified: Secondary | ICD-10-CM | POA: Diagnosis not present

## 2022-10-04 LAB — CUP PACEART REMOTE DEVICE CHECK
Date Time Interrogation Session: 20240705230401
Implantable Pulse Generator Implant Date: 20210505

## 2022-10-05 ENCOUNTER — Ambulatory Visit (HOSPITAL_COMMUNITY)
Admission: RE | Admit: 2022-10-05 | Discharge: 2022-10-05 | Disposition: A | Discharge status: Home / Self Care | Payer: PPO | Source: Ambulatory Visit | Attending: Family Medicine | Admitting: Family Medicine

## 2022-10-05 DIAGNOSIS — W19XXXS Unspecified fall, sequela: Secondary | ICD-10-CM | POA: Insufficient documentation

## 2022-10-05 DIAGNOSIS — R42 Dizziness and giddiness: Secondary | ICD-10-CM | POA: Diagnosis not present

## 2022-10-05 DIAGNOSIS — X58XXXS Exposure to other specified factors, sequela: Secondary | ICD-10-CM | POA: Insufficient documentation

## 2022-10-05 DIAGNOSIS — Z8673 Personal history of transient ischemic attack (TIA), and cerebral infarction without residual deficits: Secondary | ICD-10-CM | POA: Diagnosis not present

## 2022-10-05 DIAGNOSIS — I6523 Occlusion and stenosis of bilateral carotid arteries: Secondary | ICD-10-CM | POA: Diagnosis not present

## 2022-10-18 NOTE — Progress Notes (Signed)
[facility] Summary Report / Loop Recorder 

## 2022-11-01 ENCOUNTER — Other Ambulatory Visit: Payer: Self-pay | Admitting: Obstetrics and Gynecology

## 2022-11-01 DIAGNOSIS — N644 Mastodynia: Secondary | ICD-10-CM | POA: Diagnosis not present

## 2022-11-01 DIAGNOSIS — N6310 Unspecified lump in the right breast, unspecified quadrant: Secondary | ICD-10-CM

## 2022-11-03 ENCOUNTER — Ambulatory Visit (INDEPENDENT_AMBULATORY_CARE_PROVIDER_SITE_OTHER): Payer: PPO

## 2022-11-03 DIAGNOSIS — I639 Cerebral infarction, unspecified: Secondary | ICD-10-CM | POA: Diagnosis not present

## 2022-11-11 ENCOUNTER — Ambulatory Visit
Admission: RE | Admit: 2022-11-11 | Discharge: 2022-11-11 | Disposition: A | Discharge status: Home / Self Care | Payer: PPO | Source: Ambulatory Visit | Attending: Obstetrics and Gynecology | Admitting: Obstetrics and Gynecology

## 2022-11-11 ENCOUNTER — Ambulatory Visit: Payer: PPO

## 2022-11-11 DIAGNOSIS — N6310 Unspecified lump in the right breast, unspecified quadrant: Secondary | ICD-10-CM

## 2022-11-11 DIAGNOSIS — N6459 Other signs and symptoms in breast: Secondary | ICD-10-CM | POA: Diagnosis not present

## 2022-11-15 ENCOUNTER — Ambulatory Visit (HOSPITAL_BASED_OUTPATIENT_CLINIC_OR_DEPARTMENT_OTHER)
Admission: RE | Admit: 2022-11-15 | Discharge: 2022-11-15 | Disposition: A | Discharge status: Home / Self Care | Payer: PPO | Source: Ambulatory Visit | Attending: Family Medicine | Admitting: Family Medicine

## 2022-11-15 ENCOUNTER — Other Ambulatory Visit (HOSPITAL_BASED_OUTPATIENT_CLINIC_OR_DEPARTMENT_OTHER): Payer: Self-pay | Admitting: Family Medicine

## 2022-11-15 DIAGNOSIS — I700 Atherosclerosis of aorta: Secondary | ICD-10-CM | POA: Diagnosis not present

## 2022-11-15 DIAGNOSIS — R0989 Other specified symptoms and signs involving the circulatory and respiratory systems: Secondary | ICD-10-CM | POA: Insufficient documentation

## 2022-11-15 DIAGNOSIS — I517 Cardiomegaly: Secondary | ICD-10-CM | POA: Diagnosis not present

## 2022-11-15 DIAGNOSIS — R600 Localized edema: Secondary | ICD-10-CM | POA: Diagnosis not present

## 2022-11-15 DIAGNOSIS — N63 Unspecified lump in unspecified breast: Secondary | ICD-10-CM | POA: Diagnosis not present

## 2022-11-15 DIAGNOSIS — Z6841 Body Mass Index (BMI) 40.0 and over, adult: Secondary | ICD-10-CM | POA: Diagnosis not present

## 2022-11-18 NOTE — Progress Notes (Signed)
[facility] Summary Report / Loop Recorder 

## 2022-11-23 ENCOUNTER — Ambulatory Visit (HOSPITAL_COMMUNITY)
Admission: RE | Admit: 2022-11-23 | Discharge: 2022-11-23 | Disposition: A | Discharge status: Home / Self Care | Payer: PPO | Source: Ambulatory Visit | Attending: Physician Assistant | Admitting: Physician Assistant

## 2022-11-23 ENCOUNTER — Encounter (HOSPITAL_COMMUNITY): Payer: Self-pay | Admitting: Physician Assistant

## 2022-11-23 VITALS — BP 144/66 | HR 47 | Ht 62.0 in | Wt 261.2 lb

## 2022-11-23 DIAGNOSIS — D6869 Other thrombophilia: Secondary | ICD-10-CM | POA: Insufficient documentation

## 2022-11-23 DIAGNOSIS — I480 Paroxysmal atrial fibrillation: Secondary | ICD-10-CM | POA: Diagnosis not present

## 2022-11-23 DIAGNOSIS — I110 Hypertensive heart disease with heart failure: Secondary | ICD-10-CM | POA: Insufficient documentation

## 2022-11-23 DIAGNOSIS — Z79899 Other long term (current) drug therapy: Secondary | ICD-10-CM | POA: Diagnosis not present

## 2022-11-23 DIAGNOSIS — Z5181 Encounter for therapeutic drug level monitoring: Secondary | ICD-10-CM | POA: Diagnosis not present

## 2022-11-23 DIAGNOSIS — I5031 Acute diastolic (congestive) heart failure: Secondary | ICD-10-CM | POA: Diagnosis not present

## 2022-11-23 DIAGNOSIS — Z6841 Body Mass Index (BMI) 40.0 and over, adult: Secondary | ICD-10-CM | POA: Insufficient documentation

## 2022-11-23 LAB — BASIC METABOLIC PANEL
Anion gap: 11 (ref 5–15)
BUN: 22 mg/dL (ref 8–23)
CO2: 24 mmol/L (ref 22–32)
Calcium: 8.7 mg/dL — ABNORMAL LOW (ref 8.9–10.3)
Chloride: 103 mmol/L (ref 98–111)
Creatinine, Ser: 1.01 mg/dL — ABNORMAL HIGH (ref 0.44–1.00)
GFR, Estimated: 57 mL/min — ABNORMAL LOW (ref >60)
Glucose, Bld: 95 mg/dL (ref 70–99)
Potassium: 3.5 mmol/L (ref 3.5–5.1)
Sodium: 138 mmol/L (ref 135–145)

## 2022-11-23 LAB — BRAIN NATRIURETIC PEPTIDE: B Natriuretic Peptide: 182.6 pg/mL — ABNORMAL HIGH (ref 0.0–100.0)

## 2022-11-23 MED ORDER — FUROSEMIDE 20 MG PO TABS: 20.0000 mg | ORAL_TABLET | Freq: Two times a day (BID) | ORAL | 2 refills | Status: DC

## 2022-11-23 MED ORDER — POTASSIUM CHLORIDE CRYS ER 10 MEQ PO TBCR: 10.0000 meq | EXTENDED_RELEASE_TABLET | Freq: Every day | ORAL | 3 refills | Status: DC

## 2022-11-23 NOTE — Patient Instructions (Signed)
Increase lasix to 20mg  twice a day   Start Potassium once a day  Follow up with Dr Erich Montane office in 2 weeks - will have their office call.

## 2022-11-23 NOTE — Progress Notes (Addendum)
Primary Care Physician: Joycelyn Rua, MD Primary Cardiologist: Dr Herbie Baltimore Primary Electrophysiologist: Dr Elberta Fortis Referring Physician: Dr Arlyn Leak [name] is a 77 y.o. female with a history of DM, HTN, GERD, HLD, prior CVA, and new onset paroxysmal atrial fibrillation who presents for follow up in the [facility]. 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 The device clinic received an alert for two, 4-minute episodes of afib on 08/06/19. Patient has a CHADS2VASC score of 6. She was unaware of her arrhythmia. She does state that since her stroke she has had issues with balance and nausea. She denies significant alcohol use or snoring. Started on flecainide 01/2022 for increased afib burden.   On follow up today, patient has done well from an afib standpoint. Her ILR shows 0.3% burden. However, over the past 3 weeks she has had an increase in her lower extremity edema and abdominal bloating. She has also had some orthopnea. Her PCP starting her on Lasix 20 mg daily which has only helped minimally. CXR 8/19 showed no acute cardiopulmonary disease. She does not have any other associated symptoms. She did have a "blackout" episodes while taking Mounjaro and this was discontinued.   Today, she denies symptoms of palpitations, chest pain, PND, presyncope, syncope, snoring, daytime somnolence, bleeding. The patient is tolerating medications without difficulties and is otherwise without complaint today.   Atrial Fibrillation Risk Factors:  she does not have symptoms or diagnosis of sleep apnea. she does not have a history of rheumatic fever. she does not have a history of alcohol use. The patient does not have a history of early familial atrial fibrillation or other arrhythmias.   Atrial Fibrillation Management history:  Previous antiarrhythmic drugs: flecainide  Previous cardioversions: none Previous  ablations: none Anticoagulation history: Eliquis   Past Medical History:  Diagnosis Date   Actinic keratosis    Followed by Dr. Terri Piedra   Anxiety    Diabetes mellitus, type II, insulin dependent (HCC)    Associated with obesity: She takes Lantus, Victoza & Actos   Diplopia    Essential hypertension    GERD (gastroesophageal reflux disease)    Hyperlipidemia    Morbid obesity with BMI of 40.0-44.9, adult (HCC)    Stroke (HCC) 2021    Current Outpatient Medications  Medication Sig Dispense Refill   acetaminophen (TYLENOL) 650 MG CR tablet Take 650 mg by mouth every 8 (eight) hours as needed for pain.     alendronate (FOSAMAX) 70 MG tablet Take 70 mg by mouth once a week. Take with a full glass of water on an empty stomach.     ALPRAZolam (XANAX) 0.25 MG tablet Take 0.25 mg by mouth 3 (three) times daily as needed.     atorvastatin (LIPITOR) 40 MG tablet Take 1 tablet (40 mg total) by mouth daily. 30 tablet 2   blood glucose meter kit and supplies KIT Dispense based on patient and insurance preference. Use up to four times daily as directed. (FOR ICD-9 250.00, 250.01). 1 each 0   carvedilol (COREG) 12.5 MG tablet Take 1 tablet (12.5 mg total) by mouth 2 (two) times daily. 180 tablet 2   diphenhydramine-acetaminophen (TYLENOL PM) 25-500 MG TABS tablet Take 2 tablets by mouth at bedtime.     ELIQUIS 5 MG TABS tablet TAKE ONE TABLET BY MOUTH EVERY MORNING and TAKE ONE TABLET BY MOUTH EVERY EVENING 180 tablet 2   flecainide (TAMBOCOR) 50  MG tablet Take 1 tablet by mouth twice daily 180 tablet 1   furosemide (LASIX) 20 MG tablet Take 1 tablet (20 mg total) by mouth 2 (two) times daily. 60 tablet 2   gabapentin (NEURONTIN) 100 MG capsule TAKE ONE CAPSULE BY MOUTH three times A DAY 90 capsule 0   Insulin Syringe-Needle U-100 (RELION INSULIN SYR .3CC/29G) 29G X 1/2" 0.3 ML MISC 1 each by Does not apply route in the morning and at bedtime. 100 each 0   Lancets (ONETOUCH DELICA PLUS LANCET33G)  MISC USE 1 LANCET TO CHECK GLUCOSE UP TO 4 TIMES DAILY     LANTUS SOLOSTAR 100 UNIT/ML Solostar Pen Inject 30 Units into the skin in the morning.     lisinopril-hydrochlorothiazide (ZESTORETIC) 20-25 MG tablet Take 1 tablet by mouth daily.     ONETOUCH VERIO test strip USE 1 STRIP TO CHECK GLUCOSE UP TO 4 TIMES DAILY AS DIRECTED     pioglitazone (ACTOS) 45 MG tablet Take 45 mg by mouth daily.     potassium chloride (KLOR-CON M) 10 MEQ tablet Take 1 tablet (10 mEq total) by mouth daily. 30 tablet 3   RELION PEN NEEDLES 31G X 6 MM MISC USE 1 IN THE MORNING AND AT BEDTIME     traMADol (ULTRAM) 50 MG tablet Take 1 tablet (50 mg total) by mouth every 12 (twelve) hours as needed. 20 tablet 0   nitroGLYCERIN (NITROSTAT) 0.4 MG SL tablet Place 1 tablet (0.4 mg total) under the tongue every 5 (five) minutes as needed for chest pain. 25 tablet 5   No current facility-administered medications for this encounter.    ROS- All systems are reviewed and negative except as per the HPI above.  Physical Exam: Vitals:   11/23/22 0841  BP: (!) 144/66  Pulse: (!) 47  Weight: 118.5 kg  Height: 5\' 2"  (1.575 m)     GEN: Well nourished, well developed in no acute distress NECK: No JVD; No carotid bruits CARDIAC: Regular rate and rhythm, no murmurs, rubs, gallops RESPIRATORY:  Clear to auscultation without rales, wheezing or rhonchi  ABDOMEN: Soft, non-tender, non-distended EXTREMITIES:  No deformity, 1-2+ bilateral edema lower extremity (difficult to assess due to body habitus)    Wt Readings from Last 3 Encounters:  11/23/22 118.5 kg  05/25/22 108.3 kg  02/16/22 107.6 kg    EKG today demonstrates  SB Vent. rate 47 BPM PR interval 188 ms QRS duration 98 ms QT/QTcB 492/435 ms  Echo 08/01/19 demonstrated  1. Left ventricular ejection fraction, by estimation, is 60 to 65%. The  left ventricle has normal function. The left ventricle has no regional  wall motion abnormalities. Left ventricular  diastolic parameters are  consistent with Grade I diastolic dysfunction (impaired relaxation). Elevated left atrial pressure.   2. Right ventricular systolic function is normal. The right ventricular  size is normal. There is normal pulmonary artery systolic pressure. The estimated right ventricular systolic pressure is 30.0 mmHg.   3. The mitral valve is normal in structure. Trivial mitral valve  regurgitation. No evidence of mitral stenosis.   4. Tricuspid valve regurgitation is moderate.   5. The aortic valve is normal in structure. Aortic valve regurgitation is  not visualized. No aortic stenosis is present.   6. The inferior vena cava is normal in size with greater than 50%  respiratory variability, suggesting right atrial pressure of 3 mmHg.   Conclusion(s)/Recommendation(s): No intracardiac source of embolism detected on this transthoracic study. A transesophageal echocardiogram is  recommended to exclude cardiac source of embolism if clinically indicated.   Epic records are reviewed at length today  CHA2DS2-VASc Score = 8  The patient's score is based upon: CHF History: 1 HTN History: 1 Diabetes History: 1 Stroke History: 2 Vascular Disease History: 0 Age Score: 2 Gender Score: 1        ASSESSMENT AND PLAN: Paroxysmal Atrial Fibrillation (ICD10:  I48.0) The patient's CHA2DS2-VASc score is 8, indicating a 10.8% annual risk of stroke.   AF burden on ILR is 0.3%  Continue flecainide 50 mg BID Continue Eliquis 5 mg BID Continue Coreg 12.5 mg BID  Secondary Hypercoagulable State (ICD10:  D68.69) The patient is at significant risk for stroke/thromboembolism based upon her CHA2DS2-VASc Score of 8.  Continue Apixaban (Eliquis).   Obesity Body mass index is 47.77 kg/m.  Encouraged lifestyle modification  HTN Stable on current regimen  Acute HFpEF Edema difficult to assess due to body habitus but her weight is certainly up from her visit in February.  Will increase  Lasix to 20 mg BID and add KCL 10 meq daily Check bmet and BNP today Check echocardiogram If EF reduced, will need to consider alternate AAD.    Follow up with Dr Herbie Baltimore or APP in 1-2 weeks. AF clinic in 6 months.    Jorja Loa PA-C Afib Clinic [facility] [address] [place], [place] [address] [phone] 11/23/2022 12:03 PM

## 2022-11-24 DIAGNOSIS — I10 Essential (primary) hypertension: Secondary | ICD-10-CM | POA: Diagnosis not present

## 2022-11-24 DIAGNOSIS — Z0000 Encounter for general adult medical examination without abnormal findings: Secondary | ICD-10-CM | POA: Diagnosis not present

## 2022-11-24 DIAGNOSIS — Z23 Encounter for immunization: Secondary | ICD-10-CM | POA: Diagnosis not present

## 2022-11-24 DIAGNOSIS — D649 Anemia, unspecified: Secondary | ICD-10-CM | POA: Diagnosis not present

## 2022-11-24 DIAGNOSIS — F411 Generalized anxiety disorder: Secondary | ICD-10-CM | POA: Diagnosis not present

## 2022-11-24 DIAGNOSIS — E782 Mixed hyperlipidemia: Secondary | ICD-10-CM | POA: Diagnosis not present

## 2022-11-24 DIAGNOSIS — N63 Unspecified lump in unspecified breast: Secondary | ICD-10-CM | POA: Diagnosis not present

## 2022-11-24 DIAGNOSIS — R600 Localized edema: Secondary | ICD-10-CM | POA: Diagnosis not present

## 2022-11-24 DIAGNOSIS — I480 Paroxysmal atrial fibrillation: Secondary | ICD-10-CM | POA: Diagnosis not present

## 2022-11-24 DIAGNOSIS — D509 Iron deficiency anemia, unspecified: Secondary | ICD-10-CM | POA: Diagnosis not present

## 2022-11-24 DIAGNOSIS — E1142 Type 2 diabetes mellitus with diabetic polyneuropathy: Secondary | ICD-10-CM | POA: Diagnosis not present

## 2022-11-24 DIAGNOSIS — M810 Age-related osteoporosis without current pathological fracture: Secondary | ICD-10-CM | POA: Diagnosis not present

## 2022-11-26 ENCOUNTER — Ambulatory Visit (HOSPITAL_COMMUNITY)
Admission: RE | Admit: 2022-11-26 | Discharge: 2022-11-26 | Disposition: A | Discharge status: Home / Self Care | Payer: PPO | Source: Ambulatory Visit | Attending: Physician Assistant | Admitting: Physician Assistant

## 2022-11-26 DIAGNOSIS — I3139 Other pericardial effusion (noninflammatory): Secondary | ICD-10-CM | POA: Diagnosis not present

## 2022-11-26 DIAGNOSIS — I080 Rheumatic disorders of both mitral and aortic valves: Secondary | ICD-10-CM | POA: Insufficient documentation

## 2022-11-26 DIAGNOSIS — I480 Paroxysmal atrial fibrillation: Secondary | ICD-10-CM | POA: Diagnosis not present

## 2022-11-26 DIAGNOSIS — I4891 Unspecified atrial fibrillation: Secondary | ICD-10-CM | POA: Diagnosis not present

## 2022-11-26 NOTE — Progress Notes (Signed)
  Echocardiogram 2D Echocardiogram has been performed.  Delcie Roch 11/26/2022, 10:58 AM

## 2022-11-28 LAB — ECHOCARDIOGRAM COMPLETE
AR max vel: 1.4 cm2
AV Area VTI: 1.41 cm2
AV Area mean vel: 1.45 cm2
AV Mean grad: 11 mmHg
AV Peak grad: 20.3 mmHg
Ao pk vel: 2.25 m/s
Area-P 1/2: 3 cm2
MV VTI: 1.41 cm2
S' Lateral: 2.9 cm
Single Plane A4C EF: 66 %

## 2022-12-05 NOTE — Progress Notes (Unsigned)
Cardiology Clinic Note   Date: 12/05/2022 ID: Zyriana Burgardt, DOB 1945/10/15, MRN [id number]  Primary Cardiologist:  Bryan Lemma, MD  Patient Profile    Lanina Offutt is a 77 y.o. female who presents to the clinic today for ***    Past medical history significant for: Chest pain. LHC 01/05/2017 (unstable angina): Angiographically normal coronary arteries.  Normal LV function/LVEDP.  No MR/AS.  Cannot exclude coronary spasm. PAF. Loop recorder implantation 08/16/2019. Remote device check 11/03/2022: Normal device function, AF burden 0.4%.  No AF greater than 60 minutes. HFpEF/aortic stenosis. Echo 11/26/2022: EF 60 to 65%.  Mild LVH.  Grade II DD.  Normal RV function.  Mild RVH.  Moderately elevated PA pressure.  RVSP 49.3 mmHg.  Moderate LAE.  Small pericardial effusion.  Trivial MR.  Severe MAC.  Mild to moderate aortic valve stenosis, mean gradient 11 mmHg.  Dilated IVC, RA pressure 15 mmHg. Hypertension. Hyperlipidemia. T2DM. GERD. CVA.     History of Present Illness    Kristina Hastings was first evaluated by Dr. Herbie Baltimore on 12/31/2016 for chest pain at the request of [facility], PA-C.  She underwent LHC which showed normal coronary arteries however of spasm could not be excluded.  Upon follow-up patient was improved on beta-blocker and no further medication changes were made.  Patient presented to the ED on 07/31/2019 with complaints of dizziness.  Imaging demonstrated multiple small acute right cerebellar infarcts.  No showed normal LV/RV function with normal PA pressure, Grade I DD, no significant valvular abnormalities.  Lower extremity ultrasound negative for DVT.  Loop recorder implanted on 08/01/2019.  Loop recorder revealed PAF and patient was started on anticoagulation.  Patient was last seen by Jorja Loa, PA-C on 11/23/2022 with complaints of increased lower extremity edema and abdominal bloating with associated orthopnea.  PCP started her on low-dose Lasix with  minimal improvement.  Lasix was increased and potassium added.  Echo ordered which showed normal LV/RV function, RVH/LVH, Grade II DD, moderately elevated PA pressure, small pericardial effusion, mild to moderate aortic valve stenosis (details above).  BNP 182.6.  CRT 1.01.  Today, patient ***  HFpEF/aortic stenosis.  Echo August 2024 showed EF 60 to 65%, Grade II DD, elevated RVSP 49.3 mmHg.  Patient*** Euvolemic and well compensated on exam.  Continue carvedilol, Lasix, Zestoretic. PAF.  Loop recorder implantation May 2021 in the setting of cryptogenic stroke.  Remote check August 2024 with 0.4% A-fib burden.  Patient*** Denies spontaneous bleeding concerns.  Continue carvedilol, flecainide, Eliquis. Hypertension. BP today *** Patient denies headaches, dizziness or vision changes. Continue carvedilol, Zestoretic.   ROS: All other systems reviewed and are otherwise negative except as noted in History of Present Illness.  Studies Reviewed       ***  Risk Assessment/Calculations    {Does this patient have ATRIAL FIBRILLATION?[phone]} No BP recorded.  {Refresh Note OR Click here to enter BP  :1}***        Physical Exam    VS:  There were no vitals taken for this visit. , BMI There is no height or weight on file to calculate BMI.  GEN: Well nourished, well developed, in no acute distress. Neck: No JVD or carotid bruits. Cardiac: *** RRR. No murmurs. No rubs or gallops.   Respiratory:  Respirations regular and unlabored. Clear to auscultation without rales, wheezing or rhonchi. GI: Soft, nontender, nondistended. Extremities: Radials/DP/PT 2+ and equal bilaterally. No clubbing or cyanosis. No edema ***  Skin: Warm and dry, no rash. Neuro:  Strength intact.  Assessment & Plan   ***  Disposition: ***     {Are you ordering a CV Procedure (e.g. stress test, cath, DCCV, TEE, etc)?   Press F2        :[id number]}   Signed, Etta Grandchild. Scotty Weigelt, DNP, NP-C

## 2022-12-06 ENCOUNTER — Ambulatory Visit (INDEPENDENT_AMBULATORY_CARE_PROVIDER_SITE_OTHER): Payer: PPO

## 2022-12-06 DIAGNOSIS — I639 Cerebral infarction, unspecified: Secondary | ICD-10-CM | POA: Diagnosis not present

## 2022-12-07 ENCOUNTER — Ambulatory Visit: Payer: PPO | Attending: Student | Admitting: Student

## 2022-12-07 ENCOUNTER — Encounter: Payer: Self-pay | Admitting: Student

## 2022-12-07 VITALS — BP 130/68 | HR 56 | Ht 60.0 in | Wt 261.2 lb

## 2022-12-07 DIAGNOSIS — R0989 Other specified symptoms and signs involving the circulatory and respiratory systems: Secondary | ICD-10-CM | POA: Diagnosis not present

## 2022-12-07 DIAGNOSIS — Z79899 Other long term (current) drug therapy: Secondary | ICD-10-CM

## 2022-12-07 LAB — CUP PACEART REMOTE DEVICE CHECK
Date Time Interrogation Session: 20240909230748
Implantable Pulse Generator Implant Date: 20210505

## 2022-12-07 MED ORDER — FUROSEMIDE 40 MG PO TABS
40.0000 mg | ORAL_TABLET | Freq: Every day | ORAL | 3 refills | Status: AC
Start: 2022-12-07 — End: 2023-11-22

## 2022-12-07 NOTE — Patient Instructions (Addendum)
Medication Instructions:  INCREASE YOUR LASIX 40MG  TWICE DAILY *If you need a refill on your cardiac medications before your next appointment, please call your pharmacy*   Lab Work: BMP **RETURN THE WEEK OF 12/20/2022 FOR BMP AND BNP**  If you have labs (blood work) drawn today and your tests are completely normal, you will receive your results only by: MyChart Message (if you have MyChart) OR A paper copy in the mail If you have any lab test that is abnormal or we need to change your treatment, we will call you to review the results.   Testing/Procedures: Your physician has requested that you have a carotid duplex. This test is an ultrasound of the carotid arteries in your neck. It looks at blood flow through these arteries that supply the brain with blood. Allow one hour for this exam. There are no restrictions or special instructions.    Follow-Up: At [facility], you and your health needs are our priority.  As part of our continuing mission to provide you with exceptional heart care, we have created designated Provider Care Teams.  These Care Teams include your primary Cardiologist (physician) and Advanced Practice Providers (APPs -  Physician Assistants and Nurse Practitioners) who all work together to provide you with the care you need, when you need it.  We recommend signing up for the patient portal called "MyChart".  Sign up information is provided on this After Visit Summary.  MyChart is used to connect with patients for Virtual Visits (Telemedicine).  Patients are able to view lab/test results, encounter notes, upcoming appointments, etc.  Non-urgent messages can be sent to your provider as well.    To learn more about what you can do with MyChart, go to [email or web address].        [facility]:

## 2022-12-08 LAB — BASIC METABOLIC PANEL
BUN/Creatinine Ratio: 29 — ABNORMAL HIGH (ref 12–28)
BUN: 30 mg/dL — ABNORMAL HIGH (ref 8–27)
CO2: 28 mmol/L (ref 20–29)
Calcium: 9 mg/dL (ref 8.7–10.3)
Chloride: 101 mmol/L (ref 96–106)
Creatinine, Ser: 1.04 mg/dL — ABNORMAL HIGH (ref 0.57–1.00)
Glucose: 103 mg/dL — ABNORMAL HIGH (ref 70–99)
Potassium: 4.6 mmol/L (ref 3.5–5.2)
Sodium: 140 mmol/L (ref 134–144)
eGFR: 55 mL/min/{1.73_m2} — ABNORMAL LOW (ref >59)

## 2022-12-16 NOTE — Progress Notes (Signed)
[facility] Summary Report / Loop Recorder 

## 2022-12-21 ENCOUNTER — Ambulatory Visit (HOSPITAL_COMMUNITY)
Admission: RE | Admit: 2022-12-21 | Discharge: 2022-12-21 | Disposition: A | Discharge status: Home / Self Care | Payer: PPO | Source: Ambulatory Visit | Attending: Cardiovascular Disease | Admitting: Cardiovascular Disease

## 2022-12-21 ENCOUNTER — Other Ambulatory Visit: Payer: Self-pay

## 2022-12-21 DIAGNOSIS — Z79899 Other long term (current) drug therapy: Secondary | ICD-10-CM | POA: Diagnosis not present

## 2022-12-21 DIAGNOSIS — R0989 Other specified symptoms and signs involving the circulatory and respiratory systems: Secondary | ICD-10-CM | POA: Insufficient documentation

## 2022-12-22 LAB — BASIC METABOLIC PANEL
BUN/Creatinine Ratio: 28 (ref 12–28)
BUN: 29 mg/dL — ABNORMAL HIGH (ref 8–27)
CO2: 29 mmol/L (ref 20–29)
Calcium: 9.1 mg/dL (ref 8.7–10.3)
Chloride: 101 mmol/L (ref 96–106)
Creatinine, Ser: 1.03 mg/dL — ABNORMAL HIGH (ref 0.57–1.00)
Glucose: 120 mg/dL — ABNORMAL HIGH (ref 70–99)
Potassium: 4.6 mmol/L (ref 3.5–5.2)
Sodium: 141 mmol/L (ref 134–144)
eGFR: 56 mL/min/{1.73_m2} — ABNORMAL LOW (ref >59)

## 2022-12-22 LAB — BRAIN NATRIURETIC PEPTIDE: BNP: 291.1 pg/mL — ABNORMAL HIGH (ref 0.0–100.0)

## 2022-12-22 NOTE — Progress Notes (Addendum)
Cardiology Clinic Note   Date: 12/24/2022 ID: Pencie Debs, DOB [birth year], MRN [id number]  Primary Cardiologist:  Bryan Lemma, MD  Patient Profile    [name] is a 77 y.o. female who presents to the clinic today for close follow up of lower extremity edema.     Past medical history significant for: Chest pain. LHC 01/05/2017 (unstable angina): Angiographically normal coronary arteries.  Normal LV function/LVEDP.  No MR/AS.  Cannot exclude coronary spasm. PAF. Loop recorder implantation 08/16/2019. Remote device check 12/06/2022: 1 new AF episode lasting 1 hour 16 minutes with controlled rate, burden 0.8%. HFpEF/aortic stenosis. Echo 11/26/2022: EF 60 to 65%.  Mild LVH.  Grade II DD.  Normal RV function.  Mild RVH.  Moderately elevated PA pressure.  RVSP 49.3 mmHg.  Moderate LAE.  Small pericardial effusion.  Trivial MR.  Severe MAC.  Mild to moderate aortic valve stenosis, mean gradient 11 mmHg.  Dilated IVC, RA pressure 15 mmHg. Carotid artery stenosis. Carotid ultrasound 12/21/2022: No evidence of stenosis right ICA.  1 to 39% left ICA. Hypertension. Hyperlipidemia. Lipid panel 11/24/2022: LDL 41, HDL 46, TG 50, total 99. T2DM. GERD. CVA.      History of Present Illness    [name] was first evaluated by Dr. Herbie Baltimore on 12/31/2016 for chest pain at the request of [facility], PA-C.  She underwent LHC which showed normal coronary arteries however of spasm could not be excluded.  Upon follow-up patient was improved on beta-blocker and no further medication changes were made.  Patient presented to the ED on 07/31/2019 with complaints of dizziness.  Imaging demonstrated multiple small acute right cerebellar infarcts.  No showed normal LV/RV function with normal PA pressure, Grade I DD, no significant valvular abnormalities.  Lower extremity ultrasound negative for DVT.  Loop recorder implanted on 08/01/2019.  Loop recorder revealed PAF and patient was started on  anticoagulation.   Patient was last seen by Jorja Loa, PA-C on 11/23/2022 with complaints of increased lower extremity edema and abdominal bloating with associated orthopnea.  PCP started her on low-dose Lasix with minimal improvement.  Lasix was increased and potassium added.  Echo ordered which showed normal LV/RV function, RVH/LVH, Grade II DD, moderately elevated PA pressure, small pericardial effusion, mild to moderate aortic valve stenosis (details above).  BNP 182.6.  CRT 1.01.  Patient was last seen in the office by me on 12/07/2022 for evaluation of lower extremity edema.  Patient first noted swollen right breast for which she was evaluated by mammogram found to be negative.  Close at the same time she noticed bilateral lower extremity edema and tightness.  PCP started on Lasix which was later increased by the A-fib clinic.  Echo showed normal LV/RV function with Grade II DD, mild RVH, moderately elevated PA pressure, mild to moderate aortic stenosis.  Patient did not feel Lasix was helping.  She was unable to utilize compression stockings but was elevating legs when possible.  She denied shortness of breath, DOE, orthopnea, PND.  She was not checking weight at home.  She also reported a syncopal event 4 months prior to her visit and continued episodes of feeling "faint."  Lasix was increased and she was instructed to weigh daily.  Carotid ultrasound showed mild stenosis of left ICA.  Today, patient is accompanied by her son. She is feeling improved. Her weight is down 15 lb since her last visit. Last several weights 250.4>>246.6. She feels her legs have been less swollen and hard and her  right breast has been less swollen. Her son feels she has been walking better. She states the last couple of days her legs feel a bit tighter. She is working on reducing her sodium intake. She denies shortness of breath, DOE, orthopnea (she does sleep on 2 pillows secondary to nasal congestion) or PND. She had 1  episode of feeling "faint" (as described above) since her last visit. These episodes do not correlate with episodes of afib, as she has been keeping a journal and comparing it to her Loop recorder report. Discussed results of carotid US. I do not think these episodes are related to her heart. Suggested neurology. She would like to defer at this time.       ROS: All other systems reviewed and are otherwise negative except as noted in History of Present Illness.  Studies Reviewed    EKG is not ordered today.   Risk Assessment/Calculations     CHA2DS2-VASc Score = 8   This indicates a 10.8% annual risk of stroke. The patient's score is based upon: CHF History: 1 HTN History: 1 Diabetes History: 1 Stroke History: 2 Vascular Disease History: 0 Age Score: 2 Gender Score: 1             Physical Exam    VS:  BP 130/60   Pulse (!) 53   Ht 5' (1.524 m)   Wt 247 lb (112 kg)   SpO2 98%   BMI 48.24 kg/m  , BMI Body mass index is 48.24 kg/m.  GEN: Well nourished, well developed, in no acute distress. Neck: No JVD. Soft carotid bruit on the left.  Cardiac:  RRR. 2/6 systolic murmur. No rubs or gallops.   Respiratory:  Respirations regular and unlabored. Clear to auscultation without rales, wheezing or rhonchi. GI: Soft, nontender, nondistended. Extremities: Radials/DP/PT 2+ and equal bilaterally. No clubbing or cyanosis. Mild edema bilateral lower extremities.   Skin: Warm and dry, no rash. Neuro: Strength intact.  Assessment & Plan    HFpEF/aortic stenosis.  Echo August 2024 showed EF 60 to 65%, Grade II DD, elevated RVSP 49.3 mmHg, mild to moderate aortic stenosis.  Patient reports improved lower extremity edema and tightness. She denies shortness of breath, DOE, orthopnea or PND. Her weight is down 15 lb since last visit. Mild edema bilateral lower extremities. Euvolemic and well compensated on exam.  2/6 systolic murmur.  Continue carvedilol, Zestoretic. Continue Lasix 40 mg  bid x 1 week then 40 mg daily thereafter. Continue to weigh daily. Take extra dose of Lasix for weight gain of 3 lb overnight or 5 lb in a week. Patient has lab work scheduled with PCP on 10/8. She will make sure we get a copy. Carotid artery stenosis.  Carotid ultrasound September 2024 showed mild stenosis left ICA, no stenosis on the right.  Patient reports 1 episode of feeling "faint" since last visit.   Continue atorvastatin. PAF.  Loop recorder implantation May 2021 in the setting of cryptogenic stroke.  Remote check September 2024 with 0.8% A-fib burden, 1 episode lasting 1 hour and 16 minutes.  Patient denies palpitations.  Denies spontaneous bleeding concerns. RRR on exam today.  Continue carvedilol, flecainide, Eliquis. Hypertension. BP today 130/60.  Patient denies headaches, dizziness or vision changes. Continue carvedilol, Zestoretic. Hyperlipidemia.  LDL August 2024 41, at goal.  Continue atorvastatin.  Disposition: Continue Lasix 40 mg bid x 1 week then 40 mg daily thereafter. Continue weighing daily. Take extra dose of Lasix for weight gain of 3  lb overnight and 5 lb in a week. She will send lab results from lab draw with PCP on 10/8. Return in 3 months or sooner as needed.          Signed, Etta Grandchild. Becci Batty, DNP, NP-C

## 2022-12-24 ENCOUNTER — Encounter: Payer: Self-pay | Admitting: Student

## 2022-12-24 ENCOUNTER — Ambulatory Visit: Payer: PPO | Attending: Student | Admitting: Student

## 2022-12-24 VITALS — BP 130/60 | HR 53 | Ht 60.0 in | Wt 247.0 lb

## 2022-12-24 DIAGNOSIS — E785 Hyperlipidemia, unspecified: Secondary | ICD-10-CM | POA: Diagnosis not present

## 2022-12-24 DIAGNOSIS — I10 Essential (primary) hypertension: Secondary | ICD-10-CM | POA: Diagnosis not present

## 2022-12-24 DIAGNOSIS — I480 Paroxysmal atrial fibrillation: Secondary | ICD-10-CM | POA: Diagnosis not present

## 2022-12-24 DIAGNOSIS — I6522 Occlusion and stenosis of left carotid artery: Secondary | ICD-10-CM | POA: Diagnosis not present

## 2022-12-24 DIAGNOSIS — I5032 Chronic diastolic (congestive) heart failure: Secondary | ICD-10-CM | POA: Diagnosis not present

## 2022-12-24 DIAGNOSIS — I350 Nonrheumatic aortic (valve) stenosis: Secondary | ICD-10-CM | POA: Diagnosis not present

## 2022-12-24 NOTE — Patient Instructions (Signed)
Medication Instructions:  Continue to take the Lasix 40mg  one tablet, twice daily. After one week, begin one tablet by mouth daily.  Continue to monitor your weight gain.  If you have gained 3lbs in one night or 5lbs in one week, you may take an additional Lasix 40mg .  *If you need a refill on your cardiac medications before your next appointment, please call your pharmacy*   Lab Work: none If you have labs (blood work) drawn today and your tests are completely normal, you will receive your results only by: MyChart Message (if you have MyChart) OR A paper copy in the mail If you have any lab test that is abnormal or we need to change your treatment, we will call you to review the results.   Testing/Procedures: none   Follow-Up: At [facility], you and your health needs are our priority.  As part of our continuing mission to provide you with exceptional heart care, we have created designated Provider Care Teams.  These Care Teams include your primary Cardiologist (physician) and Advanced Practice Providers (APPs -  Physician Assistants and Nurse Practitioners) who all work together to provide you with the care you need, when you need it.  We recommend signing up for the patient portal called "MyChart".  Sign up information is provided on this After Visit Summary.  MyChart is used to connect with patients for Virtual Visits (Telemedicine).  Patients are able to view lab/test results, encounter notes, upcoming appointments, etc.  Non-urgent messages can be sent to your provider as well.   To learn more about what you can do with MyChart, go to [email or web address].    Your next appointment:   3 month(s)  Provider:   Bryan Lemma, MD

## 2023-01-04 ENCOUNTER — Encounter: Payer: Self-pay | Admitting: Hematology

## 2023-01-04 ENCOUNTER — Inpatient Hospital Stay: Payer: Medicare HMO | Attending: Hematology | Admitting: Hematology

## 2023-01-04 ENCOUNTER — Inpatient Hospital Stay: Payer: Medicare HMO

## 2023-01-04 VITALS — BP 149/46 | HR 50 | Temp 97.6°F | Resp 16 | Ht 60.0 in | Wt 239.5 lb

## 2023-01-04 DIAGNOSIS — D696 Thrombocytopenia, unspecified: Secondary | ICD-10-CM | POA: Diagnosis not present

## 2023-01-04 DIAGNOSIS — Z803 Family history of malignant neoplasm of breast: Secondary | ICD-10-CM | POA: Diagnosis not present

## 2023-01-04 DIAGNOSIS — D649 Anemia, unspecified: Secondary | ICD-10-CM

## 2023-01-04 DIAGNOSIS — Z800 Family history of malignant neoplasm of digestive organs: Secondary | ICD-10-CM | POA: Insufficient documentation

## 2023-01-04 DIAGNOSIS — I480 Paroxysmal atrial fibrillation: Secondary | ICD-10-CM | POA: Insufficient documentation

## 2023-01-04 DIAGNOSIS — Z79899 Other long term (current) drug therapy: Secondary | ICD-10-CM | POA: Insufficient documentation

## 2023-01-04 DIAGNOSIS — Z7901 Long term (current) use of anticoagulants: Secondary | ICD-10-CM | POA: Insufficient documentation

## 2023-01-04 LAB — CBC WITH DIFFERENTIAL/PLATELET
Abs Immature Granulocytes: 0.01 10*3/uL (ref 0.00–0.07)
Basophils Absolute: 0 10*3/uL (ref 0.0–0.1)
Basophils Relative: 1 %
Eosinophils Absolute: 0.1 10*3/uL (ref 0.0–0.5)
Eosinophils Relative: 3 %
HCT: 27.1 % — ABNORMAL LOW (ref 36.0–46.0)
Hemoglobin: 8.4 g/dL — ABNORMAL LOW (ref 12.0–15.0)
Immature Granulocytes: 0 %
Lymphocytes Relative: 17 %
Lymphs Abs: 0.7 10*3/uL (ref 0.7–4.0)
MCH: 30.3 pg (ref 26.0–34.0)
MCHC: 31 g/dL (ref 30.0–36.0)
MCV: 97.8 fL (ref 80.0–100.0)
Monocytes Absolute: 0.4 10*3/uL (ref 0.1–1.0)
Monocytes Relative: 10 %
Neutro Abs: 2.8 10*3/uL (ref 1.7–7.7)
Neutrophils Relative %: 69 %
Platelets: 120 10*3/uL — ABNORMAL LOW (ref 150–400)
RBC: 2.77 MIL/uL — ABNORMAL LOW (ref 3.87–5.11)
RDW: 16.3 % — ABNORMAL HIGH (ref 11.5–15.5)
WBC: 4.1 10*3/uL (ref 4.0–10.5)
nRBC: 0 % (ref 0.0–0.2)

## 2023-01-04 LAB — RETICULOCYTES
Immature Retic Fract: 16.8 % — ABNORMAL HIGH (ref 2.3–15.9)
RBC.: 2.82 MIL/uL — ABNORMAL LOW (ref 3.87–5.11)
Retic Count, Absolute: 41.5 10*3/uL (ref 19.0–186.0)
Retic Ct Pct: 1.5 % (ref 0.4–3.1)

## 2023-01-04 LAB — VITAMIN B12: Vitamin B-12: 233 pg/mL (ref 180–914)

## 2023-01-04 LAB — DIRECT ANTIGLOBULIN TEST (NOT AT ARMC)
DAT, IgG: NEGATIVE
DAT, complement: NEGATIVE

## 2023-01-04 LAB — HEPATITIS B SURFACE ANTIGEN: Hepatitis B Surface Ag: NONREACTIVE

## 2023-01-04 LAB — IRON AND TIBC
Iron: 51 ug/dL (ref 28–170)
Saturation Ratios: 11 % (ref 10.4–31.8)
TIBC: 464 ug/dL — ABNORMAL HIGH (ref 250–450)
UIBC: 413 ug/dL

## 2023-01-04 LAB — LACTATE DEHYDROGENASE: LDH: 224 U/L — ABNORMAL HIGH (ref 98–192)

## 2023-01-04 LAB — FOLATE: Folate: 11 ng/mL (ref >5.9)

## 2023-01-04 LAB — HEPATITIS C ANTIBODY: HCV Ab: NONREACTIVE

## 2023-01-04 LAB — HEPATITIS B SURFACE ANTIBODY,QUALITATIVE: Hep B S Ab: NONREACTIVE

## 2023-01-04 LAB — HEPATITIS B CORE ANTIBODY, TOTAL: Hep B Core Total Ab: NONREACTIVE

## 2023-01-04 LAB — FERRITIN: Ferritin: 36 ng/mL (ref 11–307)

## 2023-01-04 NOTE — Patient Instructions (Addendum)
You were seen and examined today by Dr. Ellin Saba. Dr. Ellin Saba is a hematologist, meaning that he specializes in blood abnormalities. Dr. Ellin Saba discussed your past medical history, family history of cancers/blood conditions and the events that led to you being here today.  You were referred to Dr. Ellin Saba due to thrombocytopenia (low platelets) and anemia (low hemoglobin).  Dr. Ellin Saba has recommended additional labs today for further evaluation.  Follow-up as scheduled.

## 2023-01-04 NOTE — Progress Notes (Signed)
[name] [address] [place] [address]   Clinic Day:  01/04/2023  Referring physician: Genia Hotter, FNP  Patient Care Team: Kerry Rua, MD as PCP - General (Family Medicine) Kerry Lex, MD as PCP - Cardiology (Cardiology) Kerry Lemming, MD as PCP - Electrophysiology (Cardiology) [name], OD as Referring Physician (Optometry)   ASSESSMENT & PLAN:   Assessment:  1.  Mild thrombocytopenia: - CBC (11/17/2022): PLT-109 - CBC (11/24/2022): PLT-121 - She has easy bruising on the extremities as she is on Eliquis for A-fib. - Recent new medication includes Lasix which was started in September.  She lost about 25 pounds after Lasix started. - Denies any B symptoms or liver disease.  2.  Normocytic anemia: - She had colonoscopy x 2, last exam at age 31. - Denies BRBPR/melena.  No prior transfusion.  3. Social/Family History: -She lives with daughter at home. Independent of ADL's and iADL's. No tobacco use. No chemical exposures.  -No family history of anemia or blood disorders. Sister had breast cancer. Father had hepatic cancer.   Plan:  1.  Mild thrombocytopenia: - Will repeat platelet count today.  Will check for nutritional deficiencies, connective tissue disorders and infectious etiologies. - Will obtain ultrasound of the spleen to evaluate for splenic sequestration.  Differential diagnosis includes early MDS.  2.  Normocytic anemia: - Will repeat CBC today and check for nutritional deficiencies, hemolysis and bone marrow infiltrative process. 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   Orders Placed This Encounter  Procedures   US SPLEEN (ABDOMEN LIMITED)    Standing Status:   Future    Standing Expiration Date:   01/04/2024    Order Specific Question:   Reason for Exam (SYMPTOM  OR DIAGNOSIS REQUIRED)    Answer:   thrombocytopenia    Order Specific Question:   Preferred imaging location?    Answer:    [facility]   CBC with Differential    Standing Status:   Future    Number of Occurrences:   1    Standing Expiration Date:   01/04/2024   Lactate dehydrogenase    Standing Status:   Future    Number of Occurrences:   1    Standing Expiration Date:   01/04/2024   Reticulocytes    Standing Status:   Future    Number of Occurrences:   1    Standing Expiration Date:   01/04/2024   Haptoglobin    Standing Status:   Future    Number of Occurrences:   1    Standing Expiration Date:   01/04/2024   Vitamin B12    Standing Status:   Future    Number of Occurrences:   1    Standing Expiration Date:   01/04/2024   Folate    Standing Status:   Future    Number of Occurrences:   1    Standing Expiration Date:   01/04/2024   Iron and TIBC ([facility] ONLY)    Standing Status:   Future    Number of Occurrences:   1    Standing Expiration Date:   01/04/2024   Ferritin    Standing Status:   Future    Number of Occurrences:   1    Standing Expiration Date:   01/04/2024   Methylmalonic acid, serum    Standing Status:   Future    Number of Occurrences:  1    Standing Expiration Date:   01/04/2024   Copper, serum    Standing Status:   Future    Number of Occurrences:   1    Standing Expiration Date:   01/04/2024   Kappa/lambda light chains    Standing Status:   Future    Number of Occurrences:   1    Standing Expiration Date:   01/04/2024   Immunofixation electrophoresis    Standing Status:   Future    Number of Occurrences:   1    Standing Expiration Date:   01/04/2024   Protein electrophoresis, serum    Standing Status:   Future    Number of Occurrences:   1    Standing Expiration Date:   01/04/2024   ANA, IFA (with reflex)    Standing Status:   Future    Number of Occurrences:   1    Standing Expiration Date:   01/04/2024   Rheumatoid factor    Standing Status:   Future    Number of Occurrences:   1    Standing Expiration Date:   01/04/2024   Hepatitis B core  antibody, total    Standing Status:   Future    Number of Occurrences:   1    Standing Expiration Date:   01/04/2024   Hepatitis C antibody    Standing Status:   Future    Number of Occurrences:   1    Standing Expiration Date:   01/04/2024   Hepatitis B surface antibody,qualitative    Standing Status:   Future    Number of Occurrences:   1    Standing Expiration Date:   01/04/2024   Hepatitis B surface antigen    Standing Status:   Future    Number of Occurrences:   1    Standing Expiration Date:   01/04/2024   Direct antiglobulin test    Standing Status:   Future    Number of Occurrences:   1    Standing Expiration Date:   01/04/2024      [name],acting as a scribe for Kerry Massed, MD.,have documented all relevant documentation on the behalf of Kerry Massed, MD,as directed by  Kerry Massed, MD while in the presence of Kerry Massed, MD.   I, Kerry Massed MD, have reviewed the above documentation for accuracy and completeness, and I agree with the above.   Kerry Massed, MD   10/8/20245:07 PM  CHIEF COMPLAINT/PURPOSE OF CONSULT:   Diagnosis: Mild thrombocytopenia and normocytic anemia  Current Therapy: Under workup  HISTORY OF PRESENT ILLNESS:   [name] is a 77 y.o. female presenting to clinic today for evaluation of thrombocytopenia and anemia at the request of [name], Kerry Cumins, FNP.  Today, she states that she is doing well overall. Her appetite level is at 100%. Her energy level is at 100%. She is accompanied by her son.   She had a bilateral MM on 11/11/22 that found right greater than left skin and trabecular thickening throughout both breasts; no focal abnormalities; and no evidence of malignancy in bilateral breasts. She was told swelling of right breast was due to fluid retention and that lasix has been improving this issue with a weight loss of 25 pounds.   She was found to have abnormal CBC from 11/24/22 with low  RBC at 2.82, low HGB at 8.4, low HCT at 26.9, low MCHC at 31.4, elevated RDW at 18.5, low platelets at 121 K, and  elevated MPV at 11.6. Of note, she has paroxysmal A-fib, HTN, and bruit of left carotid artery. She has not been told prior to labs in August about anemia and thrombocytopenia. She has not had any blood transfusions. Her last colonoscopy was 8 years ago. She denies BRBPR, melena, fever, night sweats, or unexpected weight loss. She reports easily bruising and is on Eliquis BID for A-fib. She had 2 strokes in 2021. She recently started Lasix in 11/2022. She does not take any quinine supplements.    PAST MEDICAL HISTORY:   Past Medical History: Past Medical History:  Diagnosis Date   Actinic keratosis    Followed by Dr. Terri Piedra   Anxiety    Diabetes mellitus, type II, insulin dependent (HCC)    Associated with obesity: She takes Lantus, Victoza & Actos   Diplopia    Essential hypertension    GERD (gastroesophageal reflux disease)    Hyperlipidemia    Morbid obesity with BMI of 40.0-44.9, adult (HCC)    Stroke (HCC) 2021    Surgical History: Past Surgical History:  Procedure Laterality Date   BREAST BIOPSY  1997   CARPAL TUNNEL RELEASE Right 11/12/2021   Procedure: CARPAL TUNNEL RELEASE;  Surgeon: Oliver Barre, MD;  Location: [facility];  Service: Orthopedics;  Laterality: Right;  pt knows to arrive at 6:15   LEFT HEART CATH AND CORONARY ANGIOGRAPHY N/A 01/05/2017   Procedure: LEFT HEART CATH AND CORONARY ANGIOGRAPHY;  Surgeon: Kerry Lex, MD;  Location: [facility] INVASIVE CV LAB;  Service: Cardiovascular: Angiographically normal coronary arteries with a right dominant system.  Normal EF 55-65%.   LOOP RECORDER INSERTION N/A 08/01/2019   Procedure: LOOP RECORDER INSERTION;  Surgeon: Kerry Lemming, MD;  Location: [facility] INVASIVE CV LAB;  Service: Cardiovascular;  Laterality: N/A;    Social History: Social History   Socioeconomic History   Marital status: Widowed    Spouse  name: Not on file   Number of children: 3   Years of education: 53   Highest education level: Not on file  Occupational History    Comment: Retired  Tobacco Use   Smoking status: Never   Smokeless tobacco: Never   Tobacco comments:    Never smoke 12/16/21  Substance and Sexual Activity   Alcohol use: No   Drug use: Never   Sexual activity: Not Currently  Other Topics Concern   Not on file  Social History Narrative   She is a widowed mother of 3 with 5 grandchildren. She lives with one of her daughters and grandson. She is a coming by her sister. She tries to walk, but does not do it regularly.   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   Coffee every morning, tea rare   Right handed    Social Determinants of Health   Financial Resource Strain: Not on file  Food Insecurity: No Food Insecurity (01/04/2023)   Hunger Vital Sign    Worried About Running Out of Food in the Last Year: Never true    Ran Out of Food in the Last Year: Never true  Transportation Needs: No Transportation Needs (01/04/2023)   PRAPARE - Administrator, Civil Service (Medical): No    Lack of Transportation (Non-Medical): No  Physical Activity: Not on file  Stress: Not on file  Social Connections: Not on file  Intimate Partner Violence: Not At Risk (01/04/2023)   Humiliation, Afraid, Rape, and Kick questionnaire    Fear of Current  or Ex-Partner: No    Emotionally Abused: No    Physically Abused: No    Sexually Abused: No    Family History: Family History  Problem Relation Age of Onset   Diabetes Mother    Hyperlipidemia Mother    Stroke Mother 58   Liver cancer Father    Hypertension Sister    Hyperlipidemia Sister    Diabetes Sister        On insulin   Depression Sister    Kidney disease Brother        After dialysis he had renal transplant   Diabetes Brother    Coronary artery disease Brother    Peripheral Artery Disease Brother    Stroke Brother 41   Bladder  Cancer Brother    Diabetes Brother     Current Medications:  Current Outpatient Medications:    acetaminophen (TYLENOL) 650 MG CR tablet, Take 650 mg by mouth every 8 (eight) hours as needed for pain., Disp: , Rfl:    alendronate (FOSAMAX) 70 MG tablet, Take 70 mg by mouth once a week. Take with a full glass of water on an empty stomach., Disp: , Rfl:    ALPRAZolam (XANAX) 0.25 MG tablet, Take 0.25 mg by mouth 3 (three) times daily as needed., Disp: , Rfl:    blood glucose meter kit and supplies KIT, Dispense based on patient and insurance preference. Use up to four times daily as directed. (FOR ICD-9 250.00, 250.01)., Disp: 1 each, Rfl: 0   carvedilol (COREG) 12.5 MG tablet, Take 1 tablet (12.5 mg total) by mouth 2 (two) times daily., Disp: 180 tablet, Rfl: 2   diphenhydramine-acetaminophen (TYLENOL PM) 25-500 MG TABS tablet, Take 2 tablets by mouth at bedtime., Disp: , Rfl:    ELIQUIS 5 MG TABS tablet, TAKE ONE TABLET BY MOUTH EVERY MORNING and TAKE ONE TABLET BY MOUTH EVERY EVENING, Disp: 180 tablet, Rfl: 2   flecainide (TAMBOCOR) 50 MG tablet, Take 1 tablet by mouth twice daily, Disp: 180 tablet, Rfl: 1   furosemide (LASIX) 40 MG tablet, Take 1 tablet (40 mg total) by mouth daily., Disp: 90 tablet, Rfl: 3   gabapentin (NEURONTIN) 100 MG capsule, TAKE ONE CAPSULE BY MOUTH three times A DAY, Disp: 90 capsule, Rfl: 0   Insulin Syringe-Needle U-100 (RELION INSULIN SYR .3CC/29G) 29G X 1/2" 0.3 ML MISC, 1 each by Does not apply route in the morning and at bedtime., Disp: 100 each, Rfl: 0   Lancets (ONETOUCH DELICA PLUS LANCET33G) MISC, USE 1 LANCET TO CHECK GLUCOSE UP TO 4 TIMES DAILY, Disp: , Rfl:    LANTUS SOLOSTAR 100 UNIT/ML Solostar Pen, Inject 30 Units into the skin in the morning., Disp: , Rfl:    lisinopril-hydrochlorothiazide (ZESTORETIC) 20-25 MG tablet, Take 1 tablet by mouth daily., Disp: , Rfl:    ONETOUCH VERIO test strip, USE 1 STRIP TO CHECK GLUCOSE UP TO 4 TIMES DAILY AS DIRECTED,  Disp: , Rfl:    pioglitazone (ACTOS) 45 MG tablet, Take 45 mg by mouth daily., Disp: , Rfl:    potassium chloride (KLOR-CON M) 10 MEQ tablet, Take 1 tablet (10 mEq total) by mouth daily., Disp: 30 tablet, Rfl: 3   RELION PEN NEEDLES 31G X 6 MM MISC, USE 1 IN THE MORNING AND AT BEDTIME, Disp: , Rfl:    traMADol (ULTRAM) 50 MG tablet, Take 1 tablet (50 mg total) by mouth every 12 (twelve) hours as needed., Disp: 20 tablet, Rfl: 0   atorvastatin (LIPITOR) 40  MG tablet, Take 1 tablet (40 mg total) by mouth daily., Disp: 30 tablet, Rfl: 2   nitroGLYCERIN (NITROSTAT) 0.4 MG SL tablet, Place 1 tablet (0.4 mg total) under the tongue every 5 (five) minutes as needed for chest pain., Disp: 25 tablet, Rfl: 5   Allergies: Allergies  Allergen Reactions   Livalo [Pitavastatin]     MYALGIAS    Sulfa Antibiotics Itching    Itching and red spots.    REVIEW OF SYSTEMS:   Review of Systems  Constitutional:  Negative for chills, fatigue and fever.  HENT:   Negative for lump/mass, mouth sores, nosebleeds, sore throat and trouble swallowing.   Eyes:  Negative for eye problems.  Respiratory:  Negative for cough and shortness of breath.   Cardiovascular:  Negative for chest pain, leg swelling and palpitations.  Gastrointestinal:  Positive for diarrhea. Negative for abdominal pain, constipation, nausea and vomiting.  Genitourinary:  Negative for bladder incontinence, difficulty urinating, dysuria, frequency, hematuria and nocturia.   Musculoskeletal:  Negative for arthralgias, back pain, flank pain, myalgias and neck pain.  Skin:  Negative for itching and rash.  Neurological:  Negative for dizziness, headaches and numbness.  Hematological:  Does not bruise/bleed easily.  Psychiatric/Behavioral:  Positive for sleep disturbance. Negative for depression and suicidal ideas. The patient is nervous/anxious.   All other systems reviewed and are negative.    VITALS:   Blood pressure (!) 149/46, pulse (!) 50,  temperature 97.6 F (36.4 C), temperature source Oral, resp. rate 16, height 5' (1.524 m), weight 239 lb 8 oz (108.6 kg), SpO2 100%.  Wt Readings from Last 3 Encounters:  01/04/23 239 lb 8 oz (108.6 kg)  12/24/22 247 lb (112 kg)  12/07/22 261 lb 3.2 oz (118.5 kg)    Body mass index is 46.77 kg/m.   PHYSICAL EXAM:   Physical Exam Vitals and nursing note reviewed. Exam conducted with a chaperone present.  Constitutional:      Appearance: Normal appearance.  Cardiovascular:     Rate and Rhythm: Normal rate and regular rhythm.     Pulses: Normal pulses.     Heart sounds: Normal heart sounds.  Pulmonary:     Effort: Pulmonary effort is normal.     Breath sounds: Normal breath sounds.  Abdominal:     Palpations: Abdomen is soft. There is no hepatomegaly, splenomegaly or mass.     Tenderness: There is no abdominal tenderness.  Musculoskeletal:     Right lower leg: 1+ Edema present.     Left lower leg: 1+ Edema present.  Lymphadenopathy:     Cervical: No cervical adenopathy.     Right cervical: No superficial, deep or posterior cervical adenopathy.    Left cervical: No superficial, deep or posterior cervical adenopathy.     Upper Body:     Right upper body: No supraclavicular or axillary adenopathy.     Left upper body: No supraclavicular or axillary adenopathy.  Neurological:     General: No focal deficit present.     Mental Status: She is alert and oriented to person, place, and time.  Psychiatric:        Mood and Affect: Mood normal.        Behavior: Behavior normal.     LABS:      Latest Ref Rng & Units 01/04/2023   11:05 AM 11/06/2021   11:56 AM 01/07/2021    9:30 AM  CBC  WBC 4.0 - 10.5 K/uL 4.1  6.1  6.3  Hemoglobin 12.0 - 15.0 g/dL 8.4  9.6  16.1   Hematocrit 36.0 - 46.0 % 27.1  30.8  34.9   Platelets 150 - 400 K/uL 120  174  201       Latest Ref Rng & Units 12/21/2022    9:13 AM 12/07/2022   10:19 AM 11/23/2022    9:11 AM  CMP  Glucose 70 - 99 mg/dL 096   045  95   BUN 8 - 27 mg/dL 29  30  22    Creatinine 0.57 - 1.00 mg/dL 4.09  8.11  9.14   Sodium 134 - 144 mmol/L 141  140  138   Potassium 3.5 - 5.2 mmol/L 4.6  4.6  3.5   Chloride 96 - 106 mmol/L 101  101  103   CO2 20 - 29 mmol/L 29  28  24    Calcium 8.7 - 10.3 mg/dL 9.1  9.0  8.7      No results found for: "CEA1", "CEA" / No results found for: "CEA1", "CEA" No results found for: "PSA1" No results found for: "NWG956" No results found for: "CAN125"  No results found for: "TOTALPROTELP", "ALBUMINELP", "A1GS", "A2GS", "BETS", "BETA2SER", "GAMS", "MSPIKE", "SPEI" Lab Results  Component Value Date   TIBC 464 (H) 01/04/2023   FERRITIN 36 01/04/2023   IRONPCTSAT 11 01/04/2023   Lab Results  Component Value Date   LDH 224 (H) 01/04/2023     STUDIES:   VAS US CAROTID  Result Date: 12/22/2022 Carotid Arterial Duplex Study Patient Name:  [name]  Date of Exam:   12/21/2022 Medical Rec #: [id number]           Accession #:    [id number] Date of Birth: 1945/09/05           Patient Gender: F Patient Age:   78 years Exam Location:  [facility] Procedure:      VAS US CAROTID Referring Phys: Carlos Levering --------------------------------------------------------------------------------  Indications:  Patient reports intermittent feelings of faintness, described as               seeing a circle of white light in front of her which then               dissolves. These episodes do not have a pattern. Risk Factors: Hypertension, hyperlipidemia, Diabetes, prior CVA. Performing Technologist: Olegario Shearer RVT  Examination Guidelines: A complete evaluation includes B-mode imaging, spectral Doppler, color Doppler, and power Doppler as needed of all accessible portions of each vessel. Bilateral testing is considered an integral part of a complete examination. Limited examinations for reoccurring indications may be performed as noted.  Right Carotid Findings:  +----------+--------+--------+--------+------------------+--------+           PSV cm/sEDV cm/sStenosisPlaque DescriptionComments +----------+--------+--------+--------+------------------+--------+ CCA Prox  80      18                                         +----------+--------+--------+--------+------------------+--------+ CCA Distal71      12                                         +----------+--------+--------+--------+------------------+--------+ ICA Prox  58      11                                         +----------+--------+--------+--------+------------------+--------+  ICA Mid   118     26      Normal                             +----------+--------+--------+--------+------------------+--------+ ICA Distal119     20                                         +----------+--------+--------+--------+------------------+--------+ ECA       78      4                                          +----------+--------+--------+--------+------------------+--------+ +----------+--------+-------+----------------+-------------------+           PSV cm/sEDV cmsDescribe        Arm Pressure (mmHG) +----------+--------+-------+----------------+-------------------+ [id number]            Multiphasic, [id number]                 +----------+--------+-------+----------------+-------------------+ +---------+--------+--+--------+--+---------+ VertebralPSV cm/s91EDV cm/s18Antegrade +---------+--------+--+--------+--+---------+  Left Carotid Findings: +----------+--------+--------+--------+---------------------+--------+           PSV cm/sEDV cm/sStenosisPlaque Description   Comments +----------+--------+--------+--------+---------------------+--------+ CCA Prox  80      13                                            +----------+--------+--------+--------+---------------------+--------+ CCA Distal70      13                                             +----------+--------+--------+--------+---------------------+--------+ ICA Prox  72      13                                            +----------+--------+--------+--------+---------------------+--------+ ICA Mid   81      13      1-39%   focal and hyperechoic         +----------+--------+--------+--------+---------------------+--------+ ICA Distal126     21                                            +----------+--------+--------+--------+---------------------+--------+ ECA       40      0                                             +----------+--------+--------+--------+---------------------+--------+ +----------+--------+--------+----------------+-------------------+           PSV cm/sEDV cm/sDescribe        Arm Pressure (mmHG) +----------+--------+--------+----------------+-------------------+ [id number]             Multiphasic, [id number]                 +----------+--------+--------+----------------+-------------------+ +---------+--------+--+--------+--+---------+ VertebralPSV cm/s99EDV cm/s11Antegrade +---------+--------+--+--------+--+---------+   Summary:  Right Carotid: There is no evidence of stenosis in the right ICA. The                extracranial vessels were near-normal with only minimal wall                thickening or plaque. Left Carotid: Velocities in the left ICA are consistent with a 1-39% stenosis. Vertebrals:  Bilateral vertebral arteries demonstrate antegrade flow. Subclavians: Normal flow hemodynamics were seen in bilateral subclavian              arteries. *See table(s) above for measurements and observations.  Electronically signed by Julien Nordmann MD on 12/22/2022 at 11:23:16 AM.    Final    CUP PACEART REMOTE DEVICE CHECK  Result Date: 12/07/2022 ILR summary report received. Battery status OK. Normal device function. No new symptom, tachy, brady, or pause episodes. 1 new AF episode, 1hr in duration, controlled rate, burden 0.8%,  Eliquis per PA report.  Monthly summary reports and ROV/PRN [place], [facility]

## 2023-01-05 LAB — RHEUMATOID FACTOR: Rheumatoid fact SerPl-aCnc: <10 [IU]/mL (ref <14.0)

## 2023-01-05 LAB — KAPPA/LAMBDA LIGHT CHAINS
Kappa free light chain: 28.5 mg/L — ABNORMAL HIGH (ref 3.3–19.4)
Kappa, lambda light chain ratio: 1.59 (ref 0.26–1.65)
Lambda free light chains: 17.9 mg/L (ref 5.7–26.3)

## 2023-01-05 LAB — FANA STAINING PATTERNS: Speckled Pattern: 24529

## 2023-01-05 LAB — ANTINUCLEAR ANTIBODIES, IFA: ANA Ab, IFA: POSITIVE — AB

## 2023-01-05 LAB — HAPTOGLOBIN: Haptoglobin: 181 mg/dL (ref 42–346)

## 2023-01-06 LAB — COPPER, SERUM: Copper: 82 ug/dL (ref 80–158)

## 2023-01-07 LAB — PROTEIN ELECTROPHORESIS, SERUM
A/G Ratio: 1.2 (ref 0.7–1.7)
Albumin ELP: 3.5 g/dL (ref 2.9–4.4)
Alpha-1-Globulin: 0.3 g/dL (ref 0.0–0.4)
Alpha-2-Globulin: 0.7 g/dL (ref 0.4–1.0)
Beta Globulin: 0.9 g/dL (ref 0.7–1.3)
Gamma Globulin: 1 g/dL (ref 0.4–1.8)
Globulin, Total: 2.9 g/dL (ref 2.2–3.9)
Total Protein ELP: 6.4 g/dL (ref 6.0–8.5)

## 2023-01-09 LAB — IMMUNOFIXATION ELECTROPHORESIS
IgA: 205 mg/dL (ref 64–422)
IgG (Immunoglobin G), Serum: 1178 mg/dL (ref 586–1602)
IgM (Immunoglobulin M), Srm: 56 mg/dL (ref 26–217)
Total Protein ELP: 6.6 g/dL (ref 6.0–8.5)

## 2023-01-10 ENCOUNTER — Ambulatory Visit (INDEPENDENT_AMBULATORY_CARE_PROVIDER_SITE_OTHER): Payer: Medicare HMO

## 2023-01-10 DIAGNOSIS — I480 Paroxysmal atrial fibrillation: Secondary | ICD-10-CM | POA: Diagnosis not present

## 2023-01-10 LAB — METHYLMALONIC ACID, SERUM: Methylmalonic Acid, Quantitative: 1127 nmol/L — ABNORMAL HIGH (ref 0–378)

## 2023-01-10 LAB — CUP PACEART REMOTE DEVICE CHECK
Date Time Interrogation Session: 20241012230419
Implantable Pulse Generator Implant Date: 20210505

## 2023-01-14 ENCOUNTER — Other Ambulatory Visit (HOSPITAL_COMMUNITY): Payer: PPO

## 2023-01-17 ENCOUNTER — Ambulatory Visit (HOSPITAL_COMMUNITY)
Admission: RE | Admit: 2023-01-17 | Discharge: 2023-01-17 | Disposition: A | Discharge status: Home / Self Care | Payer: Medicare HMO | Source: Ambulatory Visit | Attending: Hematology | Admitting: Hematology

## 2023-01-17 DIAGNOSIS — D696 Thrombocytopenia, unspecified: Secondary | ICD-10-CM | POA: Diagnosis not present

## 2023-01-18 DIAGNOSIS — N958 Other specified menopausal and perimenopausal disorders: Secondary | ICD-10-CM | POA: Diagnosis not present

## 2023-01-18 DIAGNOSIS — M816 Localized osteoporosis [Lequesne]: Secondary | ICD-10-CM | POA: Diagnosis not present

## 2023-01-24 ENCOUNTER — Inpatient Hospital Stay: Payer: Medicare HMO

## 2023-01-24 ENCOUNTER — Inpatient Hospital Stay (HOSPITAL_BASED_OUTPATIENT_CLINIC_OR_DEPARTMENT_OTHER): Payer: Medicare HMO | Admitting: Hematology

## 2023-01-24 VITALS — BP 135/51 | HR 86 | Temp 97.5°F | Resp 18 | Ht 60.0 in

## 2023-01-24 DIAGNOSIS — D509 Iron deficiency anemia, unspecified: Secondary | ICD-10-CM

## 2023-01-24 DIAGNOSIS — I480 Paroxysmal atrial fibrillation: Secondary | ICD-10-CM | POA: Diagnosis not present

## 2023-01-24 DIAGNOSIS — D649 Anemia, unspecified: Secondary | ICD-10-CM | POA: Diagnosis not present

## 2023-01-24 DIAGNOSIS — D696 Thrombocytopenia, unspecified: Secondary | ICD-10-CM | POA: Diagnosis not present

## 2023-01-24 DIAGNOSIS — Z800 Family history of malignant neoplasm of digestive organs: Secondary | ICD-10-CM | POA: Diagnosis not present

## 2023-01-24 DIAGNOSIS — Z7901 Long term (current) use of anticoagulants: Secondary | ICD-10-CM | POA: Diagnosis not present

## 2023-01-24 DIAGNOSIS — Z79899 Other long term (current) drug therapy: Secondary | ICD-10-CM | POA: Diagnosis not present

## 2023-01-24 DIAGNOSIS — Z803 Family history of malignant neoplasm of breast: Secondary | ICD-10-CM | POA: Diagnosis not present

## 2023-01-24 MED ORDER — CYANOCOBALAMIN 1000 MCG/ML IJ SOLN
1000.0000 ug | Freq: Once | INTRAMUSCULAR | Status: AC
  Administered 2023-01-24: 1000 ug via INTRAMUSCULAR
  Filled 2023-01-24: qty 1

## 2023-01-24 NOTE — Progress Notes (Signed)
[facility] Summary Report / Loop Recorder 

## 2023-01-24 NOTE — Progress Notes (Signed)
B 12 injection given in R deltoid without incident.  Tolerated well and site CDI.  Patient discharged via wc in stable condition with son.

## 2023-01-24 NOTE — Progress Notes (Signed)
[facility] [address] [place] [address]    Clinic Day:  01/24/2023  Referring physician: Joycelyn Rua, MD  Patient Care Team: Joycelyn Rua, MD as PCP - General (Family Medicine) Marykay Lex, MD as PCP - Cardiology (Cardiology) Regan Lemming, MD as PCP - Electrophysiology (Cardiology) Manning Charity, OD as Referring Physician (Optometry)   ASSESSMENT & PLAN:   Assessment: 1.  Mild thrombocytopenia: - CBC (11/17/2022): PLT-109 - CBC (11/24/2022): PLT-121 - She has easy bruising on the extremities as she is on Eliquis for A-fib. - Recent new medication includes Lasix which was started in September.  She lost about 25 pounds after Lasix started. - Denies any B symptoms or liver disease.   2.  Normocytic anemia: - She had colonoscopy x 2, last exam at age 58. - Denies BRBPR/melena.  No prior transfusion.   3. Social/Family History: -She lives with daughter at home. Independent of ADL's and iADL's. No tobacco use. No chemical exposures.  -No family history of anemia or blood disorders. Sister had breast cancer. Father had hepatic cancer.     Plan: 1.  Mild thrombocytopenia: - Labs on 01/04/2023: Platelet count 120.  Hepatitis B and C serology was negative.  ANA was positive without clinical symptoms. - Ultrasound of the spleen on 01/17/2023: Normal size with spleen volume 155 cc. - Mild thrombocytopenia likely from B12 deficiency.  Will replenish B12 levels.  If it does not improve, differential includes early MDS.   2.  Normocytic anemia: - We reviewed labs from 01/04/2023.  Hemoglobin is 8.4, MCV 97.  Ferritin is 36 with percent saturation of 11.  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 - Recommend parenteral iron therapy with INFeD 1 g x 1.  We discussed side effects including rare chance of anaphylactic reaction. - We discussed other blood work including multiple myeloma blood work which was  negative.  3.  B12 deficiency: - Vitamin B12 level was borderline at 233.  MMA was elevated at 1127. - Will give her B12 1 mg injection today.  She will start B12 1 mg tablet daily.  Will repeat labs in 2 months with MMA and B12 level.    Orders Placed This Encounter  Procedures   CBC    Standing Status:   Future    Standing Expiration Date:   01/24/2024   Ferritin    Standing Status:   Future    Standing Expiration Date:   01/24/2024   Iron and TIBC ([facility] ONLY)    Standing Status:   Future    Standing Expiration Date:   01/24/2024   Methylmalonic acid, serum    Standing Status:   Future    Standing Expiration Date:   01/24/2024   Vitamin B12    Standing Status:   Future    Standing Expiration Date:   01/24/2024      I,Katie Daubenspeck,acting as a scribe for Doreatha Massed, MD.,have documented all relevant documentation on the behalf of Doreatha Massed, MD,as directed by  Doreatha Massed, MD while in the presence of Doreatha Massed, MD.   I, Doreatha Massed MD, have reviewed the above documentation for accuracy and completeness, and I agree with the above.   Doreatha Massed, MD   10/28/202412:39 PM  CHIEF COMPLAINT:   Diagnosis:  Mild thrombocytopenia and normocytic anemia    Cancer Staging  No matching staging information was found for the patient.    Prior Therapy:  none  Current Therapy: INFeD and B12   HISTORY OF PRESENT ILLNESS:   Oncology History   No history exists.     INTERVAL HISTORY:   [name] is a 77 y.o. female presenting to clinic today for follow up of Mild thrombocytopenia and normocytic anemia. She was last seen by me on 01/04/23 in consultation.  Since her last visit, she underwent spleen US on 01/17/23 showing no splenomegaly.  Today, she states that she is doing well overall. Her appetite level is at 100%. Her energy level is at 70%.  PAST MEDICAL HISTORY:   Past Medical History: Past  Medical History:  Diagnosis Date   Actinic keratosis    Followed by Dr. Terri Piedra   Anxiety    Diabetes mellitus, type II, insulin dependent (HCC)    Associated with obesity: She takes Lantus, Victoza & Actos   Diplopia    Essential hypertension    GERD (gastroesophageal reflux disease)    Hyperlipidemia    Morbid obesity with BMI of 40.0-44.9, adult (HCC)    Stroke (HCC) 2021    Surgical History: Past Surgical History:  Procedure Laterality Date   BREAST BIOPSY  1997   CARPAL TUNNEL RELEASE Right 11/12/2021   Procedure: CARPAL TUNNEL RELEASE;  Surgeon: Oliver Barre, MD;  Location: [facility];  Service: Orthopedics;  Laterality: Right;  pt knows to arrive at 6:15   LEFT HEART CATH AND CORONARY ANGIOGRAPHY N/A 01/05/2017   Procedure: LEFT HEART CATH AND CORONARY ANGIOGRAPHY;  Surgeon: Marykay Lex, MD;  Location: [facility] INVASIVE CV LAB;  Service: Cardiovascular: Angiographically normal coronary arteries with a right dominant system.  Normal EF 55-65%.   LOOP RECORDER INSERTION N/A 08/01/2019   Procedure: LOOP RECORDER INSERTION;  Surgeon: Regan Lemming, MD;  Location: [facility] INVASIVE CV LAB;  Service: Cardiovascular;  Laterality: N/A;    Social History: Social History   Socioeconomic History   Marital status: Widowed    Spouse name: Not on file   Number of children: 3   Years of education: 74   Highest education level: Not on file  Occupational History    Comment: Retired  Tobacco Use   Smoking status: Never   Smokeless tobacco: Never   Tobacco comments:    Never smoke 12/16/21  Substance and Sexual Activity   Alcohol use: No   Drug use: Never   Sexual activity: Not Currently  Other Topics Concern   Not on file  Social History Narrative   She is a widowed mother of 3 with 5 grandchildren. She lives with one of her daughters and grandson. She is a coming by her sister. She tries to walk, but does not do it regularly.   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    Coffee every morning, tea rare   Right handed    Social Determinants of Health   Financial Resource Strain: Not on file  Food Insecurity: No Food Insecurity (01/04/2023)   Hunger Vital Sign    Worried About Running Out of Food in the Last Year: Never true    Ran Out of Food in the Last Year: Never true  Transportation Needs: No Transportation Needs (01/04/2023)   PRAPARE - Administrator, Civil Service (Medical): No    Lack of Transportation (Non-Medical): No  Physical Activity: Not on file  Stress: Not on file  Social Connections: Not on file  Intimate Partner Violence: Not At Risk (01/04/2023)   Humiliation, Afraid, Rape, and  Kick questionnaire    Fear of Current or Ex-Partner: No    Emotionally Abused: No    Physically Abused: No    Sexually Abused: No    Family History: Family History  Problem Relation Age of Onset   Diabetes Mother    Hyperlipidemia Mother    Stroke Mother 76   Liver cancer Father    Hypertension Sister    Hyperlipidemia Sister    Diabetes Sister        On insulin   Depression Sister    Kidney disease Brother        After dialysis he had renal transplant   Diabetes Brother    Coronary artery disease Brother    Peripheral Artery Disease Brother    Stroke Brother 67   Bladder Cancer Brother    Diabetes Brother     Current Medications:  Current Outpatient Medications:    acetaminophen (TYLENOL) 650 MG CR tablet, Take 650 mg by mouth every 8 (eight) hours as needed for pain., Disp: , Rfl:    alendronate (FOSAMAX) 70 MG tablet, Take 70 mg by mouth once a week. Take with a full glass of water on an empty stomach., Disp: , Rfl:    ALPRAZolam (XANAX) 0.25 MG tablet, Take 0.25 mg by mouth 3 (three) times daily as needed., Disp: , Rfl:    blood glucose meter kit and supplies KIT, Dispense based on patient and insurance preference. Use up to four times daily as directed. (FOR ICD-9 250.00, 250.01)., Disp: 1 each, Rfl: 0   carvedilol (COREG)  12.5 MG tablet, Take 1 tablet (12.5 mg total) by mouth 2 (two) times daily., Disp: 180 tablet, Rfl: 2   diphenhydramine-acetaminophen (TYLENOL PM) 25-500 MG TABS tablet, Take 2 tablets by mouth at bedtime., Disp: , Rfl:    ELIQUIS 5 MG TABS tablet, TAKE ONE TABLET BY MOUTH EVERY MORNING and TAKE ONE TABLET BY MOUTH EVERY EVENING, Disp: 180 tablet, Rfl: 2   flecainide (TAMBOCOR) 50 MG tablet, Take 1 tablet by mouth twice daily, Disp: 180 tablet, Rfl: 1   furosemide (LASIX) 40 MG tablet, Take 1 tablet (40 mg total) by mouth daily., Disp: 90 tablet, Rfl: 3   gabapentin (NEURONTIN) 100 MG capsule, TAKE ONE CAPSULE BY MOUTH three times A DAY, Disp: 90 capsule, Rfl: 0   Insulin Syringe-Needle U-100 (RELION INSULIN SYR .3CC/29G) 29G X 1/2" 0.3 ML MISC, 1 each by Does not apply route in the morning and at bedtime., Disp: 100 each, Rfl: 0   Lancets (ONETOUCH DELICA PLUS LANCET33G) MISC, USE 1 LANCET TO CHECK GLUCOSE UP TO 4 TIMES DAILY, Disp: , Rfl:    LANTUS SOLOSTAR 100 UNIT/ML Solostar Pen, Inject 30 Units into the skin in the morning., Disp: , Rfl:    lisinopril-hydrochlorothiazide (ZESTORETIC) 20-25 MG tablet, Take 1 tablet by mouth daily., Disp: , Rfl:    ONETOUCH VERIO test strip, USE 1 STRIP TO CHECK GLUCOSE UP TO 4 TIMES DAILY AS DIRECTED, Disp: , Rfl:    pioglitazone (ACTOS) 45 MG tablet, Take 45 mg by mouth daily., Disp: , Rfl:    RELION PEN NEEDLES 31G X 6 MM MISC, USE 1 IN THE MORNING AND AT BEDTIME, Disp: , Rfl:    traMADol (ULTRAM) 50 MG tablet, Take 1 tablet (50 mg total) by mouth every 12 (twelve) hours as needed., Disp: 20 tablet, Rfl: 0   atorvastatin (LIPITOR) 40 MG tablet, Take 1 tablet (40 mg total) by mouth daily., Disp: 30 tablet, Rfl: 2  nitroGLYCERIN (NITROSTAT) 0.4 MG SL tablet, Place 1 tablet (0.4 mg total) under the tongue every 5 (five) minutes as needed for chest pain., Disp: 25 tablet, Rfl: 5   potassium chloride (KLOR-CON) 10 MEQ tablet, Take 1 tablet by mouth daily., Disp: ,  Rfl:    Allergies: Allergies  Allergen Reactions   Livalo [Pitavastatin]     MYALGIAS    Sulfa Antibiotics Itching    Itching and red spots.    REVIEW OF SYSTEMS:   Review of Systems  Constitutional:  Negative for chills, fatigue and fever.  HENT:   Negative for lump/mass, mouth sores, nosebleeds, sore throat and trouble swallowing.   Eyes:  Negative for eye problems.  Respiratory:  Negative for cough and shortness of breath.   Cardiovascular:  Negative for chest pain, leg swelling and palpitations.  Gastrointestinal:  Positive for diarrhea. Negative for abdominal pain, constipation, nausea and vomiting.  Genitourinary:  Negative for bladder incontinence, difficulty urinating, dysuria, frequency, hematuria and nocturia.   Musculoskeletal:  Negative for arthralgias, back pain, flank pain, myalgias and neck pain.  Skin:  Negative for itching and rash.  Neurological:  Positive for dizziness. Negative for headaches and numbness.  Hematological:  Does not bruise/bleed easily.  Psychiatric/Behavioral:  Positive for depression. Negative for sleep disturbance and suicidal ideas. The patient is nervous/anxious.   All other systems reviewed and are negative.    VITALS:   Blood pressure (!) 135/51, pulse 86, temperature (!) 97.5 F (36.4 C), resp. rate 18, height 5' (1.524 m), SpO2 100%.  Wt Readings from Last 3 Encounters:  01/04/23 239 lb 8 oz (108.6 kg)  12/24/22 247 lb (112 kg)  12/07/22 261 lb 3.2 oz (118.5 kg)    Body mass index is 46.77 kg/m.  Performance status (ECOG): 1 - Symptomatic but completely ambulatory  PHYSICAL EXAM:   Physical Exam Vitals and nursing note reviewed. Exam conducted with a chaperone present.  Constitutional:      Appearance: Normal appearance.  Cardiovascular:     Rate and Rhythm: Normal rate and regular rhythm.     Pulses: Normal pulses.     Heart sounds: Normal heart sounds.  Pulmonary:     Effort: Pulmonary effort is normal.     Breath  sounds: Normal breath sounds.  Abdominal:     Palpations: Abdomen is soft. There is no hepatomegaly, splenomegaly or mass.     Tenderness: There is no abdominal tenderness.  Musculoskeletal:     Right lower leg: No edema.     Left lower leg: No edema.  Lymphadenopathy:     Cervical: No cervical adenopathy.     Right cervical: No superficial, deep or posterior cervical adenopathy.    Left cervical: No superficial, deep or posterior cervical adenopathy.     Upper Body:     Right upper body: No supraclavicular or axillary adenopathy.     Left upper body: No supraclavicular or axillary adenopathy.  Neurological:     General: No focal deficit present.     Mental Status: She is alert and oriented to person, place, and time.  Psychiatric:        Mood and Affect: Mood normal.        Behavior: Behavior normal.     LABS:      Latest Ref Rng & Units 01/04/2023   11:05 AM 11/06/2021   11:56 AM 01/07/2021    9:30 AM  CBC  WBC 4.0 - 10.5 K/uL 4.1  6.1  6.3  Hemoglobin 12.0 - 15.0 g/dL 8.4  9.6  34.7   Hematocrit 36.0 - 46.0 % 27.1  30.8  34.9   Platelets 150 - 400 K/uL 120  174  201       Latest Ref Rng & Units 12/21/2022    9:13 AM 12/07/2022   10:19 AM 11/23/2022    9:11 AM  CMP  Glucose 70 - 99 mg/dL 425  956  95   BUN 8 - 27 mg/dL 29  30  22    Creatinine 0.57 - 1.00 mg/dL 3.87  5.64  3.32   Sodium 134 - 144 mmol/L 141  140  138   Potassium 3.5 - 5.2 mmol/L 4.6  4.6  3.5   Chloride 96 - 106 mmol/L 101  101  103   CO2 20 - 29 mmol/L 29  28  24    Calcium 8.7 - 10.3 mg/dL 9.1  9.0  8.7      No results found for: "CEA1", "CEA" / No results found for: "CEA1", "CEA" No results found for: "PSA1" No results found for: "CAN199" No results found for: "CAN125"  Lab Results  Component Value Date   TOTALPROTELP 6.6 01/04/2023   TOTALPROTELP 6.4 01/04/2023   ALBUMINELP 3.5 01/04/2023   A1GS 0.3 01/04/2023   A2GS 0.7 01/04/2023   BETS 0.9 01/04/2023   GAMS 1.0 01/04/2023   MSPIKE  Not Observed 01/04/2023   SPEI Comment 01/04/2023   Lab Results  Component Value Date   TIBC 464 (H) 01/04/2023   FERRITIN 36 01/04/2023   IRONPCTSAT 11 01/04/2023   Lab Results  Component Value Date   LDH 224 (H) 01/04/2023     STUDIES:   US SPLEEN (ABDOMEN LIMITED)  Result Date: 01/17/2023 CLINICAL DATA:  Thrombocytopenia EXAM: ULTRASOUND ABDOMEN LIMITED COMPARISON:  None Available. FINDINGS: Normal echogenicity of the spleen. Spleen measures 9.0 x 8.9 x 3.7 cm. Splenic volume: 155 cc IMPRESSION: No splenomegaly. Electronically Signed   By: Annia Belt M.D.   On: 01/17/2023 09:56   CUP PACEART REMOTE DEVICE CHECK  Result Date: 01/10/2023 ILR summary report received. Battery status OK. Normal device function. No new symptom, tachy, brady, or pause episodes. No new AF episodes. AF burden is 0.3% of the time.  Monthly summary reports and ROV/PRN ML, CVRS

## 2023-01-24 NOTE — Patient Instructions (Signed)
[facility] at [facility] Discharge Instructions   You were seen and examined today by Dr. Ellin Saba.  He reviewed the results of your lab work from your previous visit. Your B12 is very low. Your iron is also low.   We will give you a B12 injection today. Dr. Kirtland Bouchard would also like for you to start taking B12 tablets daily (1000 mcg or 1 mg).  We will arrange for you to have IV iron infusion as well. We will give you this in our clinic.   We will see you back in 2 months. We will repeat lab work prior to this visit.   Return as scheduled.    Thank you for choosing [facility] at [facility] to provide your oncology and hematology care.  To afford each patient quality time with our provider, please arrive at least 15 minutes before your scheduled appointment time.   If you have a lab appointment with the Cancer Center please come in thru the Main Entrance and check in at the main information desk.  You need to re-schedule your appointment should you arrive 10 or more minutes late.  We strive to give you quality time with our providers, and arriving late affects you and other patients whose appointments are after yours.  Also, if you no show three or more times for appointments you may be dismissed from the clinic at the providers discretion.     Again, thank you for choosing [facility].  Our hope is that these requests will decrease the amount of time that you wait before being seen by our physicians.       _____________________________________________________________  Should you have questions after your visit to [facility], please contact our office at [phone] and follow the prompts.  Our office hours are 8:00 a.m. and 4:30 p.m. Monday - Friday.  Please note that voicemails left after 4:00 p.m. may not be returned until the following business day.  We are closed weekends and major holidays.  You do have access to a  nurse 24-7, just call the main number to the clinic [phone] and do not press any options, hold on the line and a nurse will answer the phone.    For prescription refill requests, have your pharmacy contact our office and allow 72 hours.    Due to Covid, you will need to wear a mask upon entering the hospital. If you do not have a mask, a mask will be given to you at the Main Entrance upon arrival. For doctor visits, patients may have 1 support person age 80 or older with them. For treatment visits, patients can not have anyone with them due to social distancing guidelines and our immunocompromised population.

## 2023-01-27 ENCOUNTER — Encounter: Payer: Self-pay | Admitting: Hematology

## 2023-01-28 ENCOUNTER — Encounter: Payer: Self-pay | Admitting: Hematology

## 2023-01-31 ENCOUNTER — Other Ambulatory Visit (HOSPITAL_COMMUNITY): Payer: Self-pay

## 2023-01-31 MED ORDER — APIXABAN 5 MG PO TABS: ORAL_TABLET | ORAL | 1 refills | Status: DC

## 2023-02-01 ENCOUNTER — Inpatient Hospital Stay: Payer: PPO | Attending: Hematology

## 2023-02-01 ENCOUNTER — Encounter: Payer: Self-pay | Admitting: Hematology

## 2023-02-01 VITALS — BP 148/47 | HR 60 | Temp 97.2°F | Resp 17

## 2023-02-01 DIAGNOSIS — Z79899 Other long term (current) drug therapy: Secondary | ICD-10-CM | POA: Insufficient documentation

## 2023-02-01 DIAGNOSIS — D696 Thrombocytopenia, unspecified: Secondary | ICD-10-CM | POA: Diagnosis not present

## 2023-02-01 DIAGNOSIS — D509 Iron deficiency anemia, unspecified: Secondary | ICD-10-CM

## 2023-02-01 MED ORDER — SODIUM CHLORIDE 0.9 % IV SOLN
50.0000 mg | Freq: Once | INTRAVENOUS | Status: AC
  Administered 2023-02-01: 50 mg via INTRAVENOUS
  Filled 2023-02-01: qty 1

## 2023-02-01 MED ORDER — SODIUM CHLORIDE 0.9 % IV SOLN
950.0000 mg | Freq: Once | INTRAVENOUS | Status: AC
  Administered 2023-02-01: 950 mg via INTRAVENOUS
  Filled 2023-02-01: qty 19

## 2023-02-01 MED ORDER — SODIUM CHLORIDE 0.9 % IV SOLN: INTRAVENOUS | Status: DC

## 2023-02-01 MED ORDER — FAMOTIDINE IN NACL 20-0.9 MG/50ML-% IV SOLN
20.0000 mg | Freq: Once | INTRAVENOUS | Status: AC
  Administered 2023-02-01: 20 mg via INTRAVENOUS
  Filled 2023-02-01: qty 50

## 2023-02-01 MED ORDER — CETIRIZINE HCL 10 MG/ML IV SOLN
10.0000 mg | Freq: Once | INTRAVENOUS | Status: AC
  Administered 2023-02-01: 10 mg via INTRAVENOUS
  Filled 2023-02-01: qty 1

## 2023-02-01 MED ORDER — ACETAMINOPHEN 325 MG PO TABS
650.0000 mg | ORAL_TABLET | Freq: Once | ORAL | Status: AC
Start: 2023-02-01 — End: 2023-02-01
  Administered 2023-02-01: 650 mg via ORAL
  Filled 2023-02-01: qty 2

## 2023-02-01 MED ORDER — METHYLPREDNISOLONE SODIUM SUCC 125 MG IJ SOLR
125.0000 mg | Freq: Once | INTRAMUSCULAR | Status: AC
  Administered 2023-02-01: 125 mg via INTRAVENOUS
  Filled 2023-02-01: qty 2

## 2023-02-01 NOTE — Progress Notes (Signed)
Patient tolerated iron infusion with no complaints voiced.  Peripheral IV site clean and dry with good blood return noted before and after infusion.  Band aid applied.  VSS with discharge and left in satisfactory condition with no s/s of distress noted.   

## 2023-02-01 NOTE — Patient Instructions (Signed)
[facility] - A DEPT OF [facility]  Discharge Instructions: Thank you for choosing [facility] to provide your oncology and hematology care.  If you have a lab appointment with the Cancer Center - please note that after April 8th, 2024, all labs will be drawn in the cancer center.  You do not have to check in or register with the main entrance as you have in the past but will complete your check-in in the cancer center.  Wear comfortable clothing and clothing appropriate for easy access to any Portacath or PICC line.   We strive to give you quality time with your provider. You may need to reschedule your appointment if you arrive late (15 or more minutes).  Arriving late affects you and other patients whose appointments are after yours.  Also, if you miss three or more appointments without notifying the office, you may be dismissed from the clinic at the provider's discretion.      For prescription refill requests, have your pharmacy contact our office and allow 72 hours for refills to be completed.    Today you received the following Infed, return as scheduled.   To help prevent nausea and vomiting after your treatment, we encourage you to take your nausea medication as directed.  BELOW ARE SYMPTOMS THAT SHOULD BE REPORTED IMMEDIATELY: *FEVER GREATER THAN 100.4 F (38 C) OR HIGHER *CHILLS OR SWEATING *NAUSEA AND VOMITING THAT IS NOT CONTROLLED WITH YOUR NAUSEA MEDICATION *UNUSUAL SHORTNESS OF BREATH *UNUSUAL BRUISING OR BLEEDING *URINARY PROBLEMS (pain or burning when urinating, or frequent urination) *BOWEL PROBLEMS (unusual diarrhea, constipation, pain near the anus) TENDERNESS IN MOUTH AND THROAT WITH OR WITHOUT PRESENCE OF ULCERS (sore throat, sores in mouth, or a toothache) UNUSUAL RASH, SWELLING OR PAIN  UNUSUAL VAGINAL DISCHARGE OR ITCHING   Items with * indicate a potential emergency and should be followed up as soon as possible or  go to the Emergency Department if any problems should occur.  Please show the CHEMOTHERAPY ALERT CARD or IMMUNOTHERAPY ALERT CARD at check-in to the Emergency Department and triage nurse.  Should you have questions after your visit or need to cancel or reschedule your appointment, please contact [facility] - A DEPT OF Eligha Bridegroom [facility] [phone]  and follow the prompts.  Office hours are 8:00 a.m. to 4:30 p.m. Monday - Friday. Please note that voicemails left after 4:00 p.m. may not be returned until the following business day.  We are closed weekends and major holidays. You have access to a nurse at all times for urgent questions. Please call the main number to the clinic [phone] and follow the prompts.  For any non-urgent questions, you may also contact your provider using MyChart. We now offer e-Visits for anyone 46 and older to request care online for non-urgent symptoms. For details visit [email or web address].   Also download the MyChart app! Go to the app store, search "MyChart", open the app, select , and log in with your MyChart username and password.

## 2023-02-10 ENCOUNTER — Inpatient Hospital Stay: Payer: PPO

## 2023-02-11 ENCOUNTER — Inpatient Hospital Stay: Payer: PPO

## 2023-02-14 ENCOUNTER — Ambulatory Visit (INDEPENDENT_AMBULATORY_CARE_PROVIDER_SITE_OTHER): Payer: PPO

## 2023-02-14 DIAGNOSIS — I480 Paroxysmal atrial fibrillation: Secondary | ICD-10-CM | POA: Diagnosis not present

## 2023-02-14 LAB — CUP PACEART REMOTE DEVICE CHECK
Date Time Interrogation Session: 20241117230304
Implantable Pulse Generator Implant Date: 20210505

## 2023-02-21 ENCOUNTER — Other Ambulatory Visit (HOSPITAL_COMMUNITY): Payer: Self-pay | Admitting: *Deleted

## 2023-02-21 MED ORDER — POTASSIUM CHLORIDE ER 10 MEQ PO TBCR: 10.0000 meq | EXTENDED_RELEASE_TABLET | Freq: Every day | ORAL | 1 refills | Status: DC

## 2023-03-09 NOTE — Progress Notes (Signed)
[facility] Summary Report / Loop Recorder 

## 2023-03-21 ENCOUNTER — Ambulatory Visit (INDEPENDENT_AMBULATORY_CARE_PROVIDER_SITE_OTHER): Payer: PPO

## 2023-03-21 DIAGNOSIS — I639 Cerebral infarction, unspecified: Secondary | ICD-10-CM | POA: Diagnosis not present

## 2023-03-21 LAB — CUP PACEART REMOTE DEVICE CHECK
Date Time Interrogation Session: 20241222230303
Implantable Pulse Generator Implant Date: 20210505

## 2023-04-04 ENCOUNTER — Inpatient Hospital Stay: Payer: PPO | Attending: Hematology

## 2023-04-04 DIAGNOSIS — E1122 Type 2 diabetes mellitus with diabetic chronic kidney disease: Secondary | ICD-10-CM | POA: Diagnosis not present

## 2023-04-04 DIAGNOSIS — Z794 Long term (current) use of insulin: Secondary | ICD-10-CM | POA: Insufficient documentation

## 2023-04-04 DIAGNOSIS — D649 Anemia, unspecified: Secondary | ICD-10-CM | POA: Insufficient documentation

## 2023-04-04 DIAGNOSIS — E785 Hyperlipidemia, unspecified: Secondary | ICD-10-CM | POA: Insufficient documentation

## 2023-04-04 DIAGNOSIS — I129 Hypertensive chronic kidney disease with stage 1 through stage 4 chronic kidney disease, or unspecified chronic kidney disease: Secondary | ICD-10-CM | POA: Insufficient documentation

## 2023-04-04 DIAGNOSIS — E538 Deficiency of other specified B group vitamins: Secondary | ICD-10-CM | POA: Diagnosis not present

## 2023-04-04 DIAGNOSIS — Z79899 Other long term (current) drug therapy: Secondary | ICD-10-CM | POA: Diagnosis not present

## 2023-04-04 DIAGNOSIS — Z7984 Long term (current) use of oral hypoglycemic drugs: Secondary | ICD-10-CM | POA: Insufficient documentation

## 2023-04-04 DIAGNOSIS — D696 Thrombocytopenia, unspecified: Secondary | ICD-10-CM | POA: Diagnosis not present

## 2023-04-04 DIAGNOSIS — K219 Gastro-esophageal reflux disease without esophagitis: Secondary | ICD-10-CM | POA: Diagnosis not present

## 2023-04-04 DIAGNOSIS — Z7901 Long term (current) use of anticoagulants: Secondary | ICD-10-CM | POA: Insufficient documentation

## 2023-04-04 DIAGNOSIS — I4891 Unspecified atrial fibrillation: Secondary | ICD-10-CM | POA: Insufficient documentation

## 2023-04-04 DIAGNOSIS — F419 Anxiety disorder, unspecified: Secondary | ICD-10-CM | POA: Diagnosis not present

## 2023-04-04 LAB — CBC
HCT: 30.6 % — ABNORMAL LOW (ref 36.0–46.0)
Hemoglobin: 9.4 g/dL — ABNORMAL LOW (ref 12.0–15.0)
MCH: 30.1 pg (ref 26.0–34.0)
MCHC: 30.7 g/dL (ref 30.0–36.0)
MCV: 98.1 fL (ref 80.0–100.0)
Platelets: 113 10*3/uL — ABNORMAL LOW (ref 150–400)
RBC: 3.12 MIL/uL — ABNORMAL LOW (ref 3.87–5.11)
RDW: 17.7 % — ABNORMAL HIGH (ref 11.5–15.5)
WBC: 3.6 10*3/uL — ABNORMAL LOW (ref 4.0–10.5)
nRBC: 0 % (ref 0.0–0.2)

## 2023-04-04 LAB — IRON AND TIBC
Iron: 44 ug/dL (ref 28–170)
Saturation Ratios: 12 % (ref 10.4–31.8)
TIBC: 365 ug/dL (ref 250–450)
UIBC: 321 ug/dL

## 2023-04-04 LAB — FERRITIN: Ferritin: 106 ng/mL (ref 11–307)

## 2023-04-04 LAB — VITAMIN B12: Vitamin B-12: 3721 pg/mL — ABNORMAL HIGH (ref 180–914)

## 2023-04-06 ENCOUNTER — Other Ambulatory Visit (HOSPITAL_COMMUNITY): Payer: Self-pay | Admitting: Physician Assistant

## 2023-04-06 LAB — METHYLMALONIC ACID, SERUM: Methylmalonic Acid, Quantitative: 239 nmol/L (ref 0–378)

## 2023-04-08 NOTE — Progress Notes (Signed)
 [facility] [address], [place] [address]   CLINIC:  Medical Oncology/Hematology  PCP:  Nanci Senior, MD [address] [place] [address] [phone]   REASON FOR VISIT:  Follow-up for mild thrombocytopenia + normocytic anemia  CURRENT THERAPY: Vitamin B12 supplement + intermittent IV iron  repletion  INTERVAL HISTORY:   [name] 78 y.o. female returns for routine follow-up of normocytic anemia and thrombocytopenia.  She was last seen by Dr. Rogers on 01/24/2023.  She received B12 injection x 1 on 01/24/2023, and IV iron  with INFeD  x 1 g on 02/01/2023.  At today's visit, she reports feeling well.  No recent hospitalizations, surgeries, or changes in baseline health status.  She reports feeling unchanged after IV iron .  She denies any significant fatigue, pica, headaches, lightheadedness, syncope, chest pain, or dyspnea.  She reports scant rectal bleeding when straining for bowel movement, which she attributes to known hemorrhoids.  She denies any gross hematochezia or melena.  She reports easy bruising in extremities and at sites of insulin  injections, which she attributes to taking Eliquis .  She denies any petechial rash, B symptoms, or recent infections.  She has not noticed any new masses or lymphadenopathy.  She is taking vitamin B12 tablet daily.  She has 100% energy and 100% appetite. She endorses that she is maintaining a stable weight.  ASSESSMENT & PLAN:  1.  Normocytic anemia - She had colonoscopy x 2, last exam at age 48. - No prior blood transfusions. - She has very mild CKD (Labs from 12/21/2022 showed creatinine 1.03/GFR 56) - Additional hematology workup: Ferritin 36, iron  saturation 11%, with Hgb 8.4/MCV 97.8 LDH mildly elevated 224.  Reticulocytes 1.5 (hypoproliferative).  Normal haptoglobin.  DAT negative. Borderline B12 deficiency with vitamin B12 233, elevated MMA 1127 Normal copper  and folate. Normal FLC ratio  1.59.  Normal immunofixation and SPEP. ANA was borderline positive (1:80), rheumatoid factor negative.   Hepatitis B and hepatitis C negative. - Received INFeD  x 1 g on 02/01/2023  - Most recent labs (04/04/2023): Hgb 9.4/MCV 98.1.  Ferritin 106, iron  saturation 12% with normal TIBC 365. - Denies BRBPR/melena.  No prior transfusion. - DIFFERENTIAL DIAGNOSIS includes anemia secondary to chronic disease, functional iron  deficiency, and mild CKD.  Differential also includes early MDS or other bone marrow infiltrative disorder.   - PLAN: Recommend additional IV iron  with IV Feraheme x 1. - Will recheck labs with office visit 4 weeks after IV iron .  If Hgb remains <10.0, we will check NGS myeloid, EPO, and bone marrow biopsy. **NOTE: Plan discussed with Dr. Rogers on 04/11/2023.  2.  Mild thrombocytopenia & leukopenia - Platelets previously normal, trending down since August 2024 (labs from PCP show platelets 109 on 11/17/2022 and platelets 121 on 11/24/2022) - WBC previously normal, trending down as of January 2025 - Ultrasound of the spleen on 01/17/2023: Normal size with spleen volume 155 cc. - Additional hematology workup: Borderline B12 deficiency with vitamin B12 233, elevated MMA 1127 Normal copper  and folate. Normal FLC ratio 1.59.  Normal immunofixation and SPEP. ANA was borderline positive (1:80), rheumatoid factor negative.  (**No clinical symptoms of connective tissue disorder.) Hepatitis B and hepatitis C negative. - Vitamin B12 repletion as below started in October 2024.   - Recent new medication includes Lasix  which was started in September 2024.  She lost about 25 pounds after Lasix  started. - Denies any B symptoms or liver disease. - She has easy bruising on  the extremities as she is on Eliquis  for A-fib. - No palpable lymphadenopathy on exam (04/11/2023)  - Most recent labs (04/04/2023): Platelets 113, with new leukopenia (WBC 3.6).  Vitamin B12 3721, MMA normalized.   -  DIFFERENTIAL DIAGNOSIS: Mild thrombocytopenia initially suspected to be from B12 deficiency.  However, since platelet level has not improved following B12 repletion, differential also includes early MDS or other bone marrow infiltrative disorder.   - PLAN:  Continue B12 as below.  Repeat CBC/D in 4 to [redacted] weeks along with office visit.  If persistent or worsening thrombocytopenia/leukopenia, we will check bone marrow biopsy, as above.  3.  B12 deficiency: - Labs from 01/04/2023 show Vitamin B12 level was borderline at 233.  MMA was elevated at 1127. - Received vitamin B12 injection x 1 mg on 01/24/2023.  Taking vitamin B12 1000 mcg daily since October 2024. - Most recent labs (04/04/2023): vitamin B12 elevated at 3721, MMA has normalized. - PLAN: Patient can DECREASE vitamin B12 to 1000 mcg every other day (or 500 mcg daily)  4.  Other history: - PMH: Insulin -dependent type 2 diabetes mellitus, hypertension, atrial fibrillation (Eliquis ), GERD, hyperlipidemia, morbid obesity, history of stroke in 2021 - She lives with daughter at home. Independent of ADL's and iADL's. No tobacco use. No chemical exposures.  - No family history of anemia or blood disorders. Sister had breast cancer. Father had hepatic cancer.    PLAN SUMMARY: >> IV Feraheme x 1 >> Same-day labs (CBC/D, ferritin, iron /TIBC, CMP, reticulocytes, immature platelet fraction) + OFFICE visit 4 weeks after IV iron      REVIEW OF SYSTEMS:   Review of Systems  Constitutional:  Negative for appetite change, chills, diaphoresis, fatigue, fever and unexpected weight change.  HENT:   Negative for lump/mass and nosebleeds.   Eyes:  Negative for eye problems.  Respiratory:  Negative for cough, hemoptysis and shortness of breath.   Cardiovascular:  Positive for leg swelling. Negative for chest pain and palpitations.  Gastrointestinal:  Negative for abdominal pain, blood in stool, constipation, diarrhea, nausea and vomiting.  Genitourinary:   Negative for hematuria.   Skin: Negative.   Neurological:  Negative for dizziness, headaches and light-headedness.  Hematological:  Does not bruise/bleed easily.     PHYSICAL EXAM:  ECOG PERFORMANCE STATUS: 2 - Symptomatic, <50% confined to bed  Vitals:   04/11/23 1042  BP: (!) 160/61  Pulse: (!) 57  Resp: 20  Temp: (!) 96.7 F (35.9 C)  SpO2: 90%   Filed Weights   04/11/23 1042  Weight: 245 lb 9.5 oz (111.4 kg)   Physical Exam Constitutional:      Appearance: Normal appearance. She is obese.  Cardiovascular:     Heart sounds: Normal heart sounds.  Pulmonary:     Breath sounds: Normal breath sounds.  Musculoskeletal:     Right lower leg: Edema present.     Left lower leg: Edema present.  Neurological:     General: No focal deficit present.     Mental Status: Mental status is at baseline.  Psychiatric:        Behavior: Behavior normal. Behavior is cooperative.     PAST MEDICAL/SURGICAL HISTORY:  Past Medical History:  Diagnosis Date   Actinic keratosis    Followed by Dr. Ivin   Anxiety    Diabetes mellitus, type II, insulin  dependent (HCC)    Associated with obesity: She takes Lantus , Victoza & Actos   Diplopia    Essential hypertension    GERD (gastroesophageal  reflux disease)    Hyperlipidemia    Morbid obesity with BMI of 40.0-44.9, adult [facility])    Stroke (HCC) 2021   Past Surgical History:  Procedure Laterality Date   BREAST BIOPSY  1997   CARPAL TUNNEL RELEASE Right 11/12/2021   Procedure: CARPAL TUNNEL RELEASE;  Surgeon: Onesimo Oneil LABOR, MD;  Location: [facility];  Service: Orthopedics;  Laterality: Right;  pt knows to arrive at 6:15   LEFT HEART CATH AND CORONARY ANGIOGRAPHY N/A 01/05/2017   Procedure: LEFT HEART CATH AND CORONARY ANGIOGRAPHY;  Surgeon: Anner Alm ORN, MD;  Location: [facility] INVASIVE CV LAB;  Service: Cardiovascular: Angiographically normal coronary arteries with a right dominant system.  Normal EF 55-65%.   LOOP RECORDER INSERTION N/A  08/01/2019   Procedure: LOOP RECORDER INSERTION;  Surgeon: Inocencio Soyla Lunger, MD;  Location: [facility] INVASIVE CV LAB;  Service: Cardiovascular;  Laterality: N/A;    SOCIAL HISTORY:  Social History   Socioeconomic History   Marital status: Widowed    Spouse name: Not on file   Number of children: 3   Years of education: 30   Highest education level: Not on file  Occupational History    Comment: Retired  Tobacco Use   Smoking status: Never   Smokeless tobacco: Never   Tobacco comments:    Never smoke 12/16/21  Substance and Sexual Activity   Alcohol use: No   Drug use: Never   Sexual activity: Not Currently  Other Topics Concern   Not on file  Social History Narrative   She is a widowed mother of 3 with 5 grandchildren. She lives with one of her daughters and grandson. She is a coming by her sister. She tries to walk, but does not do it regularly.   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   Coffee every morning, tea rare   Right handed    Social Drivers of Health   Financial Resource Strain: Not on file  Food Insecurity: No Food Insecurity (01/04/2023)   Hunger Vital Sign    Worried About Running Out of Food in the Last Year: Never true    Ran Out of Food in the Last Year: Never true  Transportation Needs: No Transportation Needs (01/04/2023)   PRAPARE - Administrator, Civil Service (Medical): No    Lack of Transportation (Non-Medical): No  Physical Activity: Not on file  Stress: Not on file  Social Connections: Not on file  Intimate Partner Violence: Not At Risk (01/04/2023)   Humiliation, Afraid, Rape, and Kick questionnaire    Fear of Current or Ex-Partner: No    Emotionally Abused: No    Physically Abused: No    Sexually Abused: No    FAMILY HISTORY:  Family History  Problem Relation Age of Onset   Diabetes Mother    Hyperlipidemia Mother    Stroke Mother 23   Liver cancer Father    Hypertension Sister    Hyperlipidemia Sister     Diabetes Sister        On insulin    Depression Sister    Kidney disease Brother        After dialysis he had renal transplant   Diabetes Brother    Coronary artery disease Brother    Peripheral Artery Disease Brother    Stroke Brother 2   Bladder Cancer Brother    Diabetes Brother     CURRENT MEDICATIONS:  Outpatient Encounter Medications as of 04/11/2023  Medication Sig  acetaminophen  (TYLENOL ) 650 MG CR tablet Take 650 mg by mouth every 8 (eight) hours as needed for pain.   alendronate (FOSAMAX) 70 MG tablet Take 70 mg by mouth once a week. Take with a full glass of water  on an empty stomach.   ALPRAZolam (XANAX) 0.25 MG tablet Take 0.25 mg by mouth 3 (three) times daily as needed.   apixaban  (ELIQUIS ) 5 MG TABS tablet TAKE ONE TABLET BY MOUTH EVERY MORNING and TAKE ONE TABLET BY MOUTH EVERY EVENING   blood glucose meter kit and supplies KIT Dispense based on patient and insurance preference. Use up to four times daily as directed. (FOR ICD-9 250.00, 250.01).   carvedilol  (COREG ) 12.5 MG tablet Take 1 tablet (12.5 mg total) by mouth 2 (two) times daily.   diphenhydramine-acetaminophen  (TYLENOL  PM) 25-500 MG TABS tablet Take 2 tablets by mouth at bedtime.   flecainide  (TAMBOCOR ) 50 MG tablet Take 1 tablet by mouth twice daily   Insulin  Syringe-Needle U-100 (RELION INSULIN  SYR .3CC/29G) 29G X 1/2 0.3 ML MISC 1 each by Does not apply route in the morning and at bedtime.   Lancets (ONETOUCH DELICA PLUS LANCET33G) MISC USE 1 LANCET TO CHECK GLUCOSE UP TO 4 TIMES DAILY   LANTUS  SOLOSTAR 100 UNIT/ML Solostar Pen Inject 30 Units into the skin in the morning.   lisinopril -hydrochlorothiazide  (ZESTORETIC ) 20-25 MG tablet Take 1 tablet by mouth daily.   ONETOUCH VERIO test strip USE 1 STRIP TO CHECK GLUCOSE UP TO 4 TIMES DAILY AS DIRECTED   pioglitazone (ACTOS) 45 MG tablet Take 45 mg by mouth daily.   potassium chloride  (KLOR-CON ) 10 MEQ tablet Take 1 tablet (10 mEq total) by mouth daily.    RELION PEN NEEDLES 31G X 6 MM MISC USE 1 IN THE MORNING AND AT BEDTIME   traMADol  (ULTRAM ) 50 MG tablet Take 1 tablet (50 mg total) by mouth every 12 (twelve) hours as needed.   atorvastatin  (LIPITOR) 40 MG tablet Take 1 tablet (40 mg total) by mouth daily.   furosemide  (LASIX ) 40 MG tablet Take 1 tablet (40 mg total) by mouth daily.   nitroGLYCERIN  (NITROSTAT ) 0.4 MG SL tablet Place 1 tablet (0.4 mg total) under the tongue every 5 (five) minutes as needed for chest pain.   [DISCONTINUED] gabapentin  (NEURONTIN ) 100 MG capsule TAKE ONE CAPSULE BY MOUTH three times A DAY   No facility-administered encounter medications on file as of 04/11/2023.    ALLERGIES:  Allergies  Allergen Reactions   Livalo [Pitavastatin]     MYALGIAS    Sulfa Antibiotics Itching    Itching and red spots.    LABORATORY DATA:  I have reviewed the labs as listed.  CBC    Component Value Date/Time   WBC 3.6 (L) 04/04/2023 1003   RBC 3.12 (L) 04/04/2023 1003   HGB 9.4 (L) 04/04/2023 1003   HGB 13.0 12/31/2016 1410   HCT 30.6 (L) 04/04/2023 1003   HCT 39.4 12/31/2016 1410   PLT 113 (L) 04/04/2023 1003   PLT 281 12/31/2016 1410   MCV 98.1 04/04/2023 1003   MCV 93 12/31/2016 1410   MCH 30.1 04/04/2023 1003   MCHC 30.7 04/04/2023 1003   RDW 17.7 (H) 04/04/2023 1003   RDW 14.0 12/31/2016 1410   LYMPHSABS 0.7 01/04/2023 1105   MONOABS 0.4 01/04/2023 1105   EOSABS 0.1 01/04/2023 1105   BASOSABS 0.0 01/04/2023 1105      Latest Ref Rng & Units 12/21/2022    9:13 AM 12/07/2022  10:19 AM 11/23/2022    9:11 AM  CMP  Glucose 70 - 99 mg/dL 879  896  95   BUN 8 - 27 mg/dL 29  30  22    Creatinine 0.57 - 1.00 mg/dL 8.96  8.95  8.98   Sodium 134 - 144 mmol/L 141  140  138   Potassium 3.5 - 5.2 mmol/L 4.6  4.6  3.5   Chloride 96 - 106 mmol/L 101  101  103   CO2 20 - 29 mmol/L 29  28  24    Calcium  8.7 - 10.3 mg/dL 9.1  9.0  8.7     DIAGNOSTIC IMAGING:  I have independently reviewed the relevant imaging and  discussed with the patient.   WRAP UP:  All questions were answered. The patient knows to call the clinic with any problems, questions or concerns.  Medical decision making: Moderate  Time spent on visit: I spent 20 minutes counseling the patient face to face. The total time spent in the appointment was 30 minutes and more than 50% was on counseling.  Pleasant Kerry Barefoot, PA-C  04/11/23 9:56 PM

## 2023-04-11 ENCOUNTER — Inpatient Hospital Stay: Payer: PPO | Admitting: Physician Assistant

## 2023-04-11 VITALS — BP 160/61 | HR 57 | Temp 96.7°F | Resp 20 | Wt 245.6 lb

## 2023-04-11 DIAGNOSIS — D649 Anemia, unspecified: Secondary | ICD-10-CM

## 2023-04-11 DIAGNOSIS — D509 Iron deficiency anemia, unspecified: Secondary | ICD-10-CM

## 2023-04-11 DIAGNOSIS — D696 Thrombocytopenia, unspecified: Secondary | ICD-10-CM

## 2023-04-11 NOTE — Patient Instructions (Signed)
 [facility] **VISIT SUMMARY & IMPORTANT INSTRUCTIONS **   You were seen today by Pleasant Barefoot PA-C for your follow-up visit.    ANEMIA:  You have moderate anemia (low hemoglobin / red blood cells).  Your anemia may be caused by the following factors. Iron  deficiency:  Your blood level improved slightly after IV iron .  We will schedule you for another dose of IV iron  to see if it improves further. Chronic disease:  Chronic disease and chronic inflammation can also cause anemia. Kidney dysfunction:  Your labs from September showed mild kidney dysfunction, which can also lead to anemia. If you hemoglobin does not improve to > 10.0 after your IV iron , we will check a bone marrow biopsy to make sure you do not have any evidence of bone marrow disorders or MDS (a low-grade blood cancer called myelodysplastic syndrome).  LOW PLATELETS & LOW WHITE BLOOD CELLS Your platelets did not improve despite correction of your B12 levels. Your white blood cells are also slightly low. If these levels continue to drop, we will check a bone marrow biopsy to make sure you do not have any evidence of bone marrow disorders or MDS (a low-grade blood cancer called myelodysplastic syndrome).  VITAMIN B12 DEFICIENCY:  Your B12 levels have improved.  You can DECREASE your B12 supplement to take it every other day.  FOLLOW-UP APPOINTMENT: Office visit in about 4 weeks  ** Thank you for trusting me with your healthcare!  I strive to provide all of my patients with quality care at each visit.  If you receive a survey for this visit, I would be so grateful to you for taking the time to provide feedback.  Thank you in advance!  ~ Quentyn Kolbeck                   Dr. Alean Stands   &   Pleasant Barefoot, PA-C   - - - - - - - - - - - - - - - - - -    Thank you for choosing  [facility] to provide your oncology and hematology care.  To afford  each patient quality time with our provider, please arrive at least 15 minutes before your scheduled appointment time.   If you have a lab appointment with the Cancer Center please come in thru the Main Entrance and check in at the main information desk.  You need to re-schedule your appointment should you arrive 10 or more minutes late.  We strive to give you quality time with our providers, and arriving late affects you and other patients whose appointments are after yours.  Also, if you no show three or more times for appointments you may be dismissed from the clinic at the providers discretion.     Again, thank you for choosing [facility].  Our hope is that these requests will decrease the amount of time that you wait before being seen by our physicians.       _____________________________________________________________  Should you have questions after your visit to [facility], please contact our office at [phone] and follow the prompts.  Our office hours are 8:00 a.m. and 4:30 p.m. Monday - Friday.  Please note that voicemails left after 4:00 p.m. may not be returned until the following business day.  We are closed weekends and major holidays.  You do have access to a nurse 24-7, just call  the main number to the clinic [phone] and do not press any options, hold on the line and a nurse will answer the phone.    For prescription refill requests, have your pharmacy contact our office and allow 72 hours.

## 2023-04-12 ENCOUNTER — Encounter: Payer: Self-pay | Admitting: Hematology

## 2023-04-18 ENCOUNTER — Inpatient Hospital Stay: Payer: PPO

## 2023-04-18 VITALS — BP 150/46 | HR 52 | Temp 97.5°F | Resp 16

## 2023-04-18 DIAGNOSIS — D509 Iron deficiency anemia, unspecified: Secondary | ICD-10-CM

## 2023-04-18 DIAGNOSIS — D696 Thrombocytopenia, unspecified: Secondary | ICD-10-CM | POA: Diagnosis not present

## 2023-04-18 MED ORDER — SODIUM CHLORIDE 0.9 % IV SOLN: INTRAVENOUS | Status: DC

## 2023-04-18 MED ORDER — SODIUM CHLORIDE 0.9 % IV SOLN
510.0000 mg | Freq: Once | INTRAVENOUS | Status: AC
  Administered 2023-04-18: 510 mg via INTRAVENOUS
  Filled 2023-04-18: qty 510

## 2023-04-18 MED ORDER — CETIRIZINE HCL 10 MG PO TABS
10.0000 mg | ORAL_TABLET | Freq: Once | ORAL | Status: AC
  Administered 2023-04-18: 10 mg via ORAL
  Filled 2023-04-18: qty 1

## 2023-04-18 MED ORDER — ACETAMINOPHEN 325 MG PO TABS
650.0000 mg | ORAL_TABLET | Freq: Once | ORAL | Status: AC
  Administered 2023-04-18: 650 mg via ORAL
  Filled 2023-04-18: qty 2

## 2023-04-18 NOTE — Patient Instructions (Signed)

## 2023-04-18 NOTE — Progress Notes (Signed)
 Patient tolerated iron infusion with no complaints voiced.  Peripheral IV site clean and dry with good blood return noted before and after infusion.  Band aid applied. Pt observed for 30 minutes post iron infusion without any complications.  VSS with discharge and left in satisfactory condition with no s/s of distress noted. All follow ups as scheduled.  Diannia Hogenson [employer]

## 2023-04-19 ENCOUNTER — Ambulatory Visit: Payer: PPO | Attending: Cardiology | Admitting: Cardiology

## 2023-04-19 VITALS — BP 124/78 | HR 48 | Ht 64.0 in | Wt 242.8 lb

## 2023-04-19 DIAGNOSIS — I639 Cerebral infarction, unspecified: Secondary | ICD-10-CM

## 2023-04-19 DIAGNOSIS — I10 Essential (primary) hypertension: Secondary | ICD-10-CM

## 2023-04-19 DIAGNOSIS — R600 Localized edema: Secondary | ICD-10-CM

## 2023-04-19 DIAGNOSIS — I480 Paroxysmal atrial fibrillation: Secondary | ICD-10-CM

## 2023-04-19 DIAGNOSIS — E118 Type 2 diabetes mellitus with unspecified complications: Secondary | ICD-10-CM

## 2023-04-19 DIAGNOSIS — I350 Nonrheumatic aortic (valve) stenosis: Secondary | ICD-10-CM | POA: Diagnosis not present

## 2023-04-19 DIAGNOSIS — D6869 Other thrombophilia: Secondary | ICD-10-CM

## 2023-04-19 DIAGNOSIS — D649 Anemia, unspecified: Secondary | ICD-10-CM

## 2023-04-19 DIAGNOSIS — E785 Hyperlipidemia, unspecified: Secondary | ICD-10-CM

## 2023-04-19 NOTE — Progress Notes (Unsigned)
Cardiology Office Note:  .   Date:  04/20/2023  ID:  [name], DOB 17-Oct-1945, MRN [id number] PCP: Joycelyn Rua, MD  [facility] Providers Cardiologist:  Bryan Lemma, MD Electrophysiologist:  Will Jorja Loa, MD     Chief Complaint  Patient presents with   Follow-up    4 months; edema better.   Atrial Fibrillation    Not overly symptomatic.    Patient Profile: .     [name] is a morbidly obese 78 y.o. female with a PMH notable for PAF, ~chronic HFpEF, DM-2 (on insulin), HTN, HLD with prior CVA who presents here for ~ 4 month f/u.  She returns at the request of Joycelyn Rua, MD.  She was initially evaluated back in October 2018 with concerns of chest pain.  She underwent cardiac catheterization which showed essentially normal coronary arteries but cannot exclude spasm.=> Symptoms were being treated with beta-blocker. Jul 31, 2019: Multiple small acute right cerebral infarcts (CVA) ILR placed-identified PAF -> initiated on DOAC GR 2 DD on echo Mild to Moderate Aortic Stenosis (AS)    I last saw [name] in April 2023 for delayed visit.  Is been several years since I seen her.  She was stable from cardiac standpoint.  Had been diagnosed with A-fib, loop recorder placed at the time of her stroke excessively started on Eliquis.  Rate controlled with carvedilol, not overly symptomatic when she has episodes of A-fib.  No bleeding issues on Eliquis.  Walking with a cane no cardiac issues just some weight gain. => No changes made.  With identification of A-fib she was seen in the A-fib clinic by Jorja Loa, PA on November 23, 2022 with complaints of edema and abdominal bloating as well as orthopnea.  She had been started on Lasix by her PCP with minimal improvement.  Because of concerns of hypokalemia she was started on potassium supplement as well.  Echo revealed RVH and LVH as well as grade 2 diastolic function and moderately elevated PAP.   Mild to moderate AAS.  She was most recently seen by Carlos Levering, NP on September 10 and 27, 2024-noting that the Lasix really was not helping.  She was not having any other symptoms of PND, orthopnea or resting or exertional dyspnea.  She did report having an episode of syncope back in April with continued episodes of near syncope after that. => On the 10th, she did increase Lasix dose further and reevaluated 2 weeks later at which time she did noticed her bradycardia gone down from 250 pounds to 246 pounds and her legs were less swollen.  Also her right breast was less swollen.  She was walking better.  Monitoring sodium intake.  Still denied other heart failure symptoms of PND, orthopnea or exertional dyspnea.  Only 1 episode of "feeling faint ".  This episode not associated with episodes of A-fib.  Suggested neurology evaluation but she deferred.  Subjective  Discussed the use of AI scribe software for clinical note transcription with the patient, who gave verbal consent to proceed.  History of Present Illness   The patient, with a history of atrial fibrillation, anemia, and edema, presents for a follow-up visit. She reports continued improvement in edema, noting a noticeable difference when not taking her prescribed diuretic. She also mentions a recent iron infusion for anemia, with a second round planned due to persistently low white cell counts. = pending f/u labs, they are considering BM Bx to exclude Myelodysplasia, myeloma etc..  The  patient denies any associated symptoms such as shortness of breath, difficulty lying flat, or waking up due to breathlessness. She reports occasional episodes of rapid heartbeats, lasting for minutes, but not exceeding 15-20 minutes. Although she has these episodes, they are not associated with any significant symptoms of chest pain or dyspnea.  She just may feel little bit more tired than usual when these occur. She is currently on flecainide and carvedilol  for atrial fibrillation, and Eliquis for stroke prevention.    The patient has a history of syncope, with one episode last year. She reports no recent episodes and an improvement in dizzy spells. She also reports a mild to moderate narrowing of the aortic valve, which is being monitored but is not currently a cause for concern.  The patient also has a history of diabetes, managed with insulin and Actos.  She reports no recent illnesses, fevers, chills, colds, or sweats. However, she did experience one episode of clamminess, which she attributes to a drop in blood sugar levels.  The patient is generally able to manage her symptoms and maintain her daily activities, albeit with the aid of a cane. She reports no difficulty sleeping, using one pillow, and waking up only to use the bathroom or due to dry mouth. She spends most of the day in a recliner, trying to keep her feet elevated to manage the edema.     No TIA or amaurosis fugax.  No claudication.  ROS:  Review of Systems - Negative except symptoms noted above    Objective   Cardiac Meds: Apixaban 5 mg twice daily, atorvastatin 40 mg daily, carvedilol 12.5 mg twice daily, flecainide 50 mg twice daily, lisinopril-HCTZ 20-25 mg, furosemide 40 mg tablet PRN.  Pioglitazone 45 mg daily, Lantus 30 units every morning  Studies Reviewed: Marland Kitchen   EKG Interpretation Date/Time:  Tuesday April 19 2023 08:36:04 EST Ventricular Rate:  49 PR Interval:  194 QRS Duration:  102 QT Interval:  482 QTC Calculation: 435 R Axis:   69  Text Interpretation: Sinus bradycardia Cannot rule out Anterior infarct , age undetermined Poor R wave progression When compared with ECG of 23-Nov-2022 08:44, No significant change was found Confirmed by Bryan Lemma ([id number]) on 04/19/2023 8:45:34 AM    Results LABS Hb: 9.4 (04/04/2023) Total cholesterol: 99 (10/2022) HDL: 46 (10/2022) LDL: 41 (10/2022) Triglycerides: 50 (10/2022) A1c: 6.3 (08/2022)  DIAGNOSTIC ECHO  11/26/2022: EF 60 to 65%.  No RWMA.  Mild LVH.  GR 2 DD.  Mildly enlarged RV with moderately elevated PAP-50 mmHg, elevated RAP-15 mmHg.  Moderate LA dilation.  Small pericardial effusion.  Severe MAC but mild MR.  Mild-moderate AS (mean AVG 11 mmHg)  CATH 01/05/2017:: Angiographic normal coronary arteries of the right coronary dominant system.  Cannot exclude coronary spasm.  Normal EF 55 to 60%.  No AS.  Risk Assessment/Calculations:    CHA2DS2-VASc Score = 8   This indicates a 10.8% annual risk of stroke. The patient's score is based upon: CHF History: 1 HTN History: 1 Diabetes History: 1 Stroke History: 2 Vascular Disease History: 0 Age Score: 2 Gender Score: 1   On DOAC         Physical Exam:   VS:  BP 124/78   Pulse (!) 48   Ht 5\' 4"  (1.626 m)   Wt 242 lb 12.8 oz (110.1 kg)   SpO2 97%   BMI 41.68 kg/m    Wt Readings from Last 3 Encounters:  04/19/23 242 lb 12.8 oz (  110.1 kg)  04/11/23 245 lb 9.5 oz (111.4 kg)  01/04/23 239 lb 8 oz (108.6 kg)    GEN: Well nourished, well groomed in no acute distress; morbidly obese but otherwise appearing NECK: No JVD; No carotid bruits CARDIAC: RRR, Normal S1, S2; 2/6 SEM at RUSB-neck; otherwise no additional murmurs, rubs, gallops RESPIRATORY:  Clear to auscultation without rales, wheezing or rhonchi ; nonlabored, good air movement. ABDOMEN: Soft, non-tender, non-distended EXTREMITIES:  No edema; No deformity ; somewhat slow/shuffling gait    ASSESSMENT AND PLAN: .    Problem List Items Addressed This Visit       Cardiology Problems   Acute cerebrovascular accident of cerebellum (HCC)   Aortic stenosis, mild (Chronic)   Mild to moderate narrowing noted on echocardiogram.  (However on my assessment, the valve gradient would not be in the moderate category) -Monitor for progression, with repeat echocardiogram in 2-3 years.      Essential hypertension (Chronic)   Excellent blood pressure control on current meds:  Lisinopril-HCTZ and carvedilol -Continue current meds at current doses.      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  Okay to hold Eliquis 2 to 3 days preop for surgeries or procedures.      Hyperlipidemia with target LDL less than 70 (Chronic)   Labs as of August 2024 showed LDL of 41 cholesterol 99 on Lipitor 40 mg daily. Well-controlled.  Low threshold to reduce dose if there is any symptoms of memory loss or myalgias. -For now continue current dose of atorvastatin/Lipitor      Paroxysmal atrial fibrillation (HCC) - Primary (Chronic)   Patient reports occasional episodes of rapid heart rate, but no significant shortness of breath or other symptoms. Managed with Flecainide and Carvedilol for rhythm/rate control along with Eliquis for CVA prophylaxis -Continue Flecainide and Carvedilol as prescribed. -Continue Eliquis -Monitor for symptoms of prolonged atrial fibrillation.      Relevant Orders   EKG 12-Lead (Completed)     Other   Bilateral lower extremity edema (Chronic)   Improvement noted with Lasix, but patient occasionally skips doses due to scheduling. -Continue Lasix as prescribed, with additional dose if weight increases by more than 3 pounds. -Consider elevating feet multiple times a day and for 30 minutes before bed to aid in fluid drainage.      DM (diabetes mellitus), type 2 with complications (HCC) (Chronic)   On Lantus, no longer on Victoza but is still on Actos. Actos may be contributing to edema. -Discuss with diabetes doctor the possibility of switching from Actos to [place] or [place].      Morbid obesity (HCC) (Chronic)   Weights are up and down.  A lot of this has to do somewhat with her lack of exercise.  I think the fluid weight is stabilized. Exercises  limited but we discussed different exercise options that do not require her going to a gym and center.      Normocytic anemia   Concern for pancytopenia. Undergoing iron infusions for treatment. Possible blood cancer or myelodysplasia suspected, with a bone marrow biopsy planned. -Continue iron infusions as directed by hematologist. Molli Knock to undergo bone marrow biopsy if recommended.  (Would be okay to hold Eliquis 2 days preop)       Follow-Up: Return in about 6 months (around 10/17/2023) for  Alternate 6 month follow-up with APP & MD.with nurse practitioner & Return in 1 year for a check-up with me.   Total time spent: 23 min spent with patient + 19  min spent charting = 42 min I spent 42 minutes in the care of [facility] today including reviewing outside labs from [facility] (1 minute), reviewing studies (4 minutes), face to face time discussing treatment options (23), reviewing records from multiple noncardiac clinic visits as well as previous cardiology clinic visits (7 minutes), 7 minutes dictating, and documenting in the encounter.     Signed, Marykay Lex, MD, MS Bryan Lemma, M.D., M.S. Interventional Cardiologist  [facility]  Pager # [phone] Phone # [phone] [address] [place], [place] [address]

## 2023-04-19 NOTE — Patient Instructions (Signed)
  Other Instructions    Discuss with your diabetic doctor about switching Actos to either [place] or [place]  Medication Instructions:    Continue with current medications Continue use Lasix ( furosemide) if you gain more than 3 lbs overnight  *If you need a refill on your cardiac medications before your next appointment, please call your pharmacy*   Lab Work:  Not needed    Testing/Procedures: Not needed   Follow-Up: At [facility], you and your health needs are our priority.  As part of our continuing mission to provide you with exceptional heart care, we have created designated Provider Care Teams.  These Care Teams include your primary Cardiologist (physician) and Advanced Practice Providers (APPs -  Physician Assistants and Nurse Practitioners) who all work together to provide you with the care you need, when you need it.  We recommend signing up for the patient portal called "MyChart".  Sign up information is provided on this After Visit Summary.  MyChart is used to connect with patients for Virtual Visits (Telemedicine).  Patients are able to view lab/test results, encounter notes, upcoming appointments, etc.  Non-urgent messages can be sent to your provider as well.   To learn more about what you can do with MyChart, go to [email or web address].    Your next appointment:   6 month(s)  The format for your next appointment:   In Person  Provider:   Joni Reining, DNP, ANP or Bernadene Person, NP    Then, Bryan Lemma, MD will plan to see you again in 12 month(s).   Other Instructions    Discuss with your diabetic doctor about switching Actos to either [place] or [place]

## 2023-04-20 ENCOUNTER — Encounter: Payer: Self-pay | Admitting: Cardiology

## 2023-04-20 DIAGNOSIS — R600 Localized edema: Secondary | ICD-10-CM | POA: Insufficient documentation

## 2023-04-20 DIAGNOSIS — I350 Nonrheumatic aortic (valve) stenosis: Secondary | ICD-10-CM | POA: Insufficient documentation

## 2023-04-20 NOTE — Assessment & Plan Note (Signed)
Labs as of August 2024 showed LDL of 41 cholesterol 99 on Lipitor 40 mg daily. Well-controlled.  Low threshold to reduce dose if there is any symptoms of memory loss or myalgias. -For now continue current dose of atorvastatin/Lipitor

## 2023-04-20 NOTE — Assessment & Plan Note (Signed)
Excellent blood pressure control on current meds: Lisinopril-HCTZ and carvedilol -Continue current meds at current doses.

## 2023-04-20 NOTE — Assessment & Plan Note (Signed)
Weights are up and down.  A lot of this has to do somewhat with her lack of exercise.  I think the fluid weight is stabilized. Exercises limited but we discussed different exercise options that do not require her going to a gym and center.

## 2023-04-20 NOTE — Assessment & Plan Note (Signed)
Patient reports occasional episodes of rapid heart rate, but no significant shortness of breath or other symptoms. Managed with Flecainide and Carvedilol for rhythm/rate control along with Eliquis for CVA prophylaxis -Continue Flecainide and Carvedilol as prescribed. -Continue Eliquis -Monitor for symptoms of prolonged atrial fibrillation.

## 2023-04-20 NOTE — Assessment & Plan Note (Signed)
On Lantus, no longer on Victoza but is still on Actos. Actos may be contributing to edema. -Discuss with diabetes doctor the possibility of switching from Actos to [place] or [place].

## 2023-04-20 NOTE — Assessment & Plan Note (Signed)
CHA2DS2-VASc Score = 8 => taking Eliquis 5 mg twice daily Somewhat concerning with her anemia, however anemia seems to be more related to issue than bleeding.  Okay to hold Eliquis 2 to 3 days preop for surgeries or procedures.

## 2023-04-20 NOTE — Assessment & Plan Note (Signed)
Mild to moderate narrowing noted on echocardiogram.  (However on my assessment, the valve gradient would not be in the moderate category) -Monitor for progression, with repeat echocardiogram in 2-3 years.

## 2023-04-20 NOTE — Assessment & Plan Note (Signed)
Concern for pancytopenia. Undergoing iron infusions for treatment. Possible blood cancer or myelodysplasia suspected, with a bone marrow biopsy planned. -Continue iron infusions as directed by hematologist. Molli Knock to undergo bone marrow biopsy if recommended.  (Would be okay to hold Eliquis 2 days preop)

## 2023-04-20 NOTE — Assessment & Plan Note (Signed)
Improvement noted with Lasix, but patient occasionally skips doses due to scheduling. -Continue Lasix as prescribed, with additional dose if weight increases by more than 3 pounds. -Consider elevating feet multiple times a day and for 30 minutes before bed to aid in fluid drainage.

## 2023-04-25 ENCOUNTER — Ambulatory Visit: Payer: PPO

## 2023-04-25 DIAGNOSIS — I639 Cerebral infarction, unspecified: Secondary | ICD-10-CM

## 2023-04-25 LAB — CUP PACEART REMOTE DEVICE CHECK
Date Time Interrogation Session: 20250126230019
Implantable Pulse Generator Implant Date: 20210505

## 2023-04-26 NOTE — Progress Notes (Signed)
[facility] Summary Report / Loop Recorder

## 2023-05-20 NOTE — Progress Notes (Unsigned)
 [facility] [address], [place] [address]   CLINIC:  Medical Oncology/Hematology  PCP:  Joycelyn Rua, MD [address] [place] [address] [phone]   REASON FOR VISIT:  Follow-up for mild thrombocytopenia + normocytic anemia  CURRENT THERAPY: Vitamin B12 supplement + intermittent IV iron repletion  INTERVAL HISTORY:   Ms. Lenk 78 y.o. female returns for routine follow-up of normocytic anemia and thrombocytopenia.  She was last seen by Rojelio Brenner PA-C on 04/11/2023.  She received another dose of IV iron with Feraheme x 1 on 04/18/2023.Marland Kitchen  At today's visit, she reports feeling well. *** No recent hospitalizations, surgeries, or changes in baseline health status.  She reports feeling unchanged after IV iron.  *** *** She denies any significant fatigue, pica, headaches, lightheadedness, syncope, chest pain, or dyspnea. *** She reports scant rectal bleeding when straining for bowel movement, which she attributes to known hemorrhoids. *** She denies any gross hematochezia or melena.  She reports easy bruising in extremities and at sites of insulin injections, which she attributes to taking Eliquis.  *** *** She denies any petechial rash, B symptoms, or recent infections. *** She has not noticed any new masses or lymphadenopathy. *** She is taking vitamin B12 tablet daily *** 1000 mcg EOD or 500 mcg daily?  ***  She has 100***% energy and 100***% appetite. She endorses that she is maintaining a stable weight.  ASSESSMENT & PLAN:  1.  Normocytic anemia - She had colonoscopy x 2, last exam at age 56. - No prior blood transfusions. - She has very mild CKD (Labs from 12/21/2022 showed creatinine 1.03/GFR 56) - Additional hematology workup: Ferritin 36, iron saturation 11%, with Hgb 8.4/MCV 97.8 LDH mildly elevated 224.  Reticulocytes 1.5 (hypoproliferative).  Normal haptoglobin.  DAT negative. Borderline B12 deficiency with vitamin  B12 233, elevated MMA 1127 Normal copper and folate. Normal FLC ratio 1.59.  Normal immunofixation and SPEP. ANA was borderline positive (1:80), rheumatoid factor negative.   Hepatitis B and hepatitis C negative. - Received INFeD x 1 g on 02/01/2023 + Feraheme x 1 on 04/18/2023 - Hematology workup revealed iron deficiency and B12 deficiency.  Received IV iron repletion and oral B12 repletion. B12 levels from 04/04/2023 showed resolution of B12 deficiency. Labs from 05/23/2023 show *** iron - Labs today (05/23/2023): *** Creatinine ***/GFR *** Reticulocytes *** Ferritin ***, iron/TIBC *** - Denies BRBPR/melena.  No prior transfusion.*** - DIFFERENTIAL DIAGNOSIS includes anemia secondary to chronic disease, functional iron deficiency, and mild CKD.  Differential also includes early MDS or other bone marrow infiltrative disorder.   - PLAN: *** TBD *** recommend additional IV iron with IV Feraheme x 1. - Will recheck labs with office visit 4 weeks after IV iron.  If Hgb remains <10.0, we will check NGS myeloid, EPO, and bone marrow biopsy. **NOTE: Plan discussed with Dr. Ellin Saba on 04/11/2023.  2.  Mild thrombocytopenia & leukopenia - Platelets previously normal, trending down since August 2024 (labs from PCP show platelets 109 on 11/17/2022 and platelets 121 on 11/24/2022) - WBC previously normal, trending down as of January 2025 - Ultrasound of the spleen on 01/17/2023: Normal size with spleen volume 155 cc. - Hematology workup: Borderline B12 deficiency with vitamin B12 233, elevated MMA 1127 Normal copper and folate. Normal FLC ratio 1.59.  Normal immunofixation and SPEP. ANA was borderline positive (1:80), rheumatoid factor negative.  (**No clinical symptoms of connective tissue disorder.) Hepatitis B and hepatitis C negative. -  Vitamin B12 repletion as below started in October 2024.  Labs from January 2025 showed resolution of B12 deficiency. - Recent new medication includes Lasix which was  started in September 2024.  She lost about 25 pounds after Lasix started. - Denies any B symptoms or liver disease.*** - She has easy bruising on the extremities as she is on Eliquis for A-fib.*** - No palpable lymphadenopathy on exam (04/11/2023)  - Most recent labs (05/19/2023): Platelets ***, with ***new leukopenia (WBC ***).  Immature platelet fraction ***. - DIFFERENTIAL DIAGNOSIS: Mild thrombocytopenia initially suspected to be from B12 deficiency.  However, since platelet level has not improved following B12 repletion, differential also includes early MDS or other bone marrow infiltrative disorder.*** - PLAN: *** TBD *** Continue B12 as below.  Repeat CBC/D in 4 to [redacted] weeks along with office visit.  If persistent or worsening thrombocytopenia/leukopenia, we will check bone marrow biopsy, as above.  3.  B12 deficiency: - Labs from 01/04/2023 show Vitamin B12 level was borderline at 233.  MMA was elevated at 1127. - Received vitamin B12 injection x 1 mg on 01/24/2023.  Was taking vitamin B12 1000 mcg daily since October 2024. - Labs from January 2025 showed resolution of B12 deficiency with vitamin B12 elevated at 3721, and normalization of MMA - Reduced vitamin B12 supplement to 1000 mcg every other day (or 500 mcg daily) in January 2025 *** - PLAN: Continue vitamin B12 1000 mcg every other day (or 500 mcg daily)  4.  Other history: - PMH: Insulin-dependent type 2 diabetes mellitus, hypertension, atrial fibrillation (Eliquis), GERD, hyperlipidemia, morbid obesity, history of stroke in 2021 - She lives with daughter at home. Independent of ADL's and iADL's. No tobacco use. No chemical exposures.  - No family history of anemia or blood disorders. Sister had breast cancer. Father had hepatic cancer.    PLAN SUMMARY: >> *** >> ***     REVIEW OF SYSTEMS:***  Review of Systems  Constitutional:  Negative for appetite change, chills, diaphoresis, fatigue, fever and unexpected weight change.   HENT:   Negative for lump/mass and nosebleeds.   Eyes:  Negative for eye problems.  Respiratory:  Negative for cough, hemoptysis and shortness of breath.   Cardiovascular:  Positive for leg swelling. Negative for chest pain and palpitations.  Gastrointestinal:  Negative for abdominal pain, blood in stool, constipation, diarrhea, nausea and vomiting.  Genitourinary:  Negative for hematuria.   Skin: Negative.   Neurological:  Negative for dizziness, headaches and light-headedness.  Hematological:  Does not bruise/bleed easily.     PHYSICAL EXAM:  ECOG PERFORMANCE STATUS: 2 - Symptomatic, <50% confined to bed *** There were no vitals filed for this visit.  There were no vitals filed for this visit.  Physical Exam Constitutional:      Appearance: Normal appearance. She is obese.  Cardiovascular:     Heart sounds: Normal heart sounds.  Pulmonary:     Breath sounds: Normal breath sounds.  Musculoskeletal:     Right lower leg: Edema present.     Left lower leg: Edema present.  Neurological:     General: No focal deficit present.     Mental Status: Mental status is at baseline.  Psychiatric:        Behavior: Behavior normal. Behavior is cooperative.     PAST MEDICAL/SURGICAL HISTORY:  Past Medical History:  Diagnosis Date   Actinic keratosis    Followed by Dr. Terri Piedra   Anxiety    Diabetes mellitus, type II,  insulin dependent (HCC)    Associated with obesity: She takes Lantus, Victoza & Actos   Diplopia    Essential hypertension    GERD (gastroesophageal reflux disease)    Hyperlipidemia    Morbid obesity with BMI of 40.0-44.9, adult [facility])    Stroke (HCC) 2021   Past Surgical History:  Procedure Laterality Date   BREAST BIOPSY  1997   CARPAL TUNNEL RELEASE Right 11/12/2021   Procedure: CARPAL TUNNEL RELEASE;  Surgeon: Oliver Barre, MD;  Location: [facility];  Service: Orthopedics;  Laterality: Right;  pt knows to arrive at 6:15   LEFT HEART CATH AND CORONARY ANGIOGRAPHY  N/A 01/05/2017   Procedure: LEFT HEART CATH AND CORONARY ANGIOGRAPHY;  Surgeon: Marykay Lex, MD;  Location: [facility] INVASIVE CV LAB;  Service: Cardiovascular: Angiographically normal coronary arteries with a right dominant system.  Normal EF 55-65%.   LOOP RECORDER INSERTION N/A 08/01/2019   Procedure: LOOP RECORDER INSERTION;  Surgeon: Regan Lemming, MD;  Location: [facility] INVASIVE CV LAB;  Service: Cardiovascular;  Laterality: N/A;    SOCIAL HISTORY:  Social History   Socioeconomic History   Marital status: Widowed    Spouse name: Not on file   Number of children: 3   Years of education: 92   Highest education level: Not on file  Occupational History    Comment: Retired  Tobacco Use   Smoking status: Never   Smokeless tobacco: Never   Tobacco comments:    Never smoke 12/16/21  Substance and Sexual Activity   Alcohol use: No   Drug use: Never   Sexual activity: Not Currently  Other Topics Concern   Not on file  Social History Narrative   She is a widowed mother of 3 with 5 grandchildren. She lives with one of her daughters and grandson. She is a coming by her sister. She tries to walk, but does not do it regularly.   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   Coffee every morning, tea rare   Right handed    Social Drivers of Health   Financial Resource Strain: Not on file  Food Insecurity: No Food Insecurity (01/04/2023)   Hunger Vital Sign    Worried About Running Out of Food in the Last Year: Never true    Ran Out of Food in the Last Year: Never true  Transportation Needs: No Transportation Needs (01/04/2023)   PRAPARE - Administrator, Civil Service (Medical): No    Lack of Transportation (Non-Medical): No  Physical Activity: Not on file  Stress: Not on file  Social Connections: Not on file  Intimate Partner Violence: Not At Risk (01/04/2023)   Humiliation, Afraid, Rape, and Kick questionnaire    Fear of Current or Ex-Partner: No     Emotionally Abused: No    Physically Abused: No    Sexually Abused: No    FAMILY HISTORY:  Family History  Problem Relation Age of Onset   Diabetes Mother    Hyperlipidemia Mother    Stroke Mother 8   Liver cancer Father    Hypertension Sister    Hyperlipidemia Sister    Diabetes Sister        On insulin   Depression Sister    Kidney disease Brother        After dialysis he had renal transplant   Diabetes Brother    Coronary artery disease Brother    Peripheral Artery Disease Brother    Stroke Brother  68   Bladder Cancer Brother    Diabetes Brother     CURRENT MEDICATIONS:  Outpatient Encounter Medications as of 05/23/2023  Medication Sig   acetaminophen (TYLENOL) 650 MG CR tablet Take 650 mg by mouth every 8 (eight) hours as needed for pain.   alendronate (FOSAMAX) 70 MG tablet Take 70 mg by mouth once a week. Take with a full glass of water on an empty stomach.   ALPRAZolam (XANAX) 0.25 MG tablet Take 0.25 mg by mouth 3 (three) times daily as needed.   apixaban (ELIQUIS) 5 MG TABS tablet TAKE ONE TABLET BY MOUTH EVERY MORNING and TAKE ONE TABLET BY MOUTH EVERY EVENING   atorvastatin (LIPITOR) 40 MG tablet Take 1 tablet (40 mg total) by mouth daily.   blood glucose meter kit and supplies KIT Dispense based on patient and insurance preference. Use up to four times daily as directed. (FOR ICD-9 250.00, 250.01).   carvedilol (COREG) 12.5 MG tablet Take 1 tablet (12.5 mg total) by mouth 2 (two) times daily.   diphenhydramine-acetaminophen (TYLENOL PM) 25-500 MG TABS tablet Take 2 tablets by mouth at bedtime.   flecainide (TAMBOCOR) 50 MG tablet Take 1 tablet by mouth twice daily   furosemide (LASIX) 40 MG tablet Take 1 tablet (40 mg total) by mouth daily.   Insulin Syringe-Needle U-100 (RELION INSULIN SYR .3CC/29G) 29G X 1/2" 0.3 ML MISC 1 each by Does not apply route in the morning and at bedtime.   Lancets (ONETOUCH DELICA PLUS LANCET33G) MISC USE 1 LANCET TO CHECK GLUCOSE UP  TO 4 TIMES DAILY   LANTUS SOLOSTAR 100 UNIT/ML Solostar Pen Inject 30 Units into the skin in the morning.   lisinopril-hydrochlorothiazide (ZESTORETIC) 20-25 MG tablet Take 1 tablet by mouth daily.   nitroGLYCERIN (NITROSTAT) 0.4 MG SL tablet Place 1 tablet (0.4 mg total) under the tongue every 5 (five) minutes as needed for chest pain.   ONETOUCH VERIO test strip USE 1 STRIP TO CHECK GLUCOSE UP TO 4 TIMES DAILY AS DIRECTED   pioglitazone (ACTOS) 45 MG tablet Take 45 mg by mouth daily.   RELION PEN NEEDLES 31G X 6 MM MISC USE 1 IN THE MORNING AND AT BEDTIME   No facility-administered encounter medications on file as of 05/23/2023.    ALLERGIES:  Allergies  Allergen Reactions   Livalo [Pitavastatin]     MYALGIAS    Sulfa Antibiotics Itching    Itching and red spots.    LABORATORY DATA:  I have reviewed the labs as listed.  CBC    Component Value Date/Time   WBC 3.6 (L) 04/04/2023 1003   RBC 3.12 (L) 04/04/2023 1003   HGB 9.4 (L) 04/04/2023 1003   HGB 13.0 12/31/2016 1410   HCT 30.6 (L) 04/04/2023 1003   HCT 39.4 12/31/2016 1410   PLT 113 (L) 04/04/2023 1003   PLT 281 12/31/2016 1410   MCV 98.1 04/04/2023 1003   MCV 93 12/31/2016 1410   MCH 30.1 04/04/2023 1003   MCHC 30.7 04/04/2023 1003   RDW 17.7 (H) 04/04/2023 1003   RDW 14.0 12/31/2016 1410   LYMPHSABS 0.7 01/04/2023 1105   MONOABS 0.4 01/04/2023 1105   EOSABS 0.1 01/04/2023 1105   BASOSABS 0.0 01/04/2023 1105      Latest Ref Rng & Units 12/21/2022    9:13 AM 12/07/2022   10:19 AM 11/23/2022    9:11 AM  CMP  Glucose 70 - 99 mg/dL 562  130  95   BUN 8 -  27 mg/dL 29  30  22    Creatinine 0.57 - 1.00 mg/dL 1.19  1.47  8.29   Sodium 134 - 144 mmol/L 141  140  138   Potassium 3.5 - 5.2 mmol/L 4.6  4.6  3.5   Chloride 96 - 106 mmol/L 101  101  103   CO2 20 - 29 mmol/L 29  28  24    Calcium 8.7 - 10.3 mg/dL 9.1  9.0  8.7     DIAGNOSTIC IMAGING:  I have independently reviewed the relevant imaging and discussed with  the patient.   WRAP UP:  All questions were answered. The patient knows to call the clinic with any problems, questions or concerns.  Medical decision making: Moderate***  Time spent on visit: I spent 20 minutes counseling the patient face to face. The total time spent in the appointment was 30 minutes and more than 50% was on counseling.  Carnella Guadalajara, PA-C  ***

## 2023-05-23 ENCOUNTER — Inpatient Hospital Stay: Payer: PPO | Attending: Hematology | Admitting: Physician Assistant

## 2023-05-23 ENCOUNTER — Inpatient Hospital Stay: Payer: PPO

## 2023-05-23 VITALS — BP 147/53 | HR 51 | Temp 96.9°F | Resp 20 | Wt 239.0 lb

## 2023-05-23 DIAGNOSIS — D696 Thrombocytopenia, unspecified: Secondary | ICD-10-CM

## 2023-05-23 DIAGNOSIS — D72819 Decreased white blood cell count, unspecified: Secondary | ICD-10-CM | POA: Diagnosis not present

## 2023-05-23 DIAGNOSIS — D649 Anemia, unspecified: Secondary | ICD-10-CM | POA: Diagnosis not present

## 2023-05-23 DIAGNOSIS — N182 Chronic kidney disease, stage 2 (mild): Secondary | ICD-10-CM | POA: Insufficient documentation

## 2023-05-23 DIAGNOSIS — I4891 Unspecified atrial fibrillation: Secondary | ICD-10-CM | POA: Diagnosis not present

## 2023-05-23 DIAGNOSIS — E538 Deficiency of other specified B group vitamins: Secondary | ICD-10-CM | POA: Diagnosis not present

## 2023-05-23 DIAGNOSIS — I129 Hypertensive chronic kidney disease with stage 1 through stage 4 chronic kidney disease, or unspecified chronic kidney disease: Secondary | ICD-10-CM | POA: Insufficient documentation

## 2023-05-23 DIAGNOSIS — D61818 Other pancytopenia: Secondary | ICD-10-CM

## 2023-05-23 DIAGNOSIS — Z7901 Long term (current) use of anticoagulants: Secondary | ICD-10-CM | POA: Diagnosis not present

## 2023-05-23 DIAGNOSIS — D509 Iron deficiency anemia, unspecified: Secondary | ICD-10-CM

## 2023-05-23 DIAGNOSIS — E1122 Type 2 diabetes mellitus with diabetic chronic kidney disease: Secondary | ICD-10-CM | POA: Insufficient documentation

## 2023-05-23 LAB — COMPREHENSIVE METABOLIC PANEL
ALT: 29 U/L (ref 0–44)
AST: 30 U/L (ref 15–41)
Albumin: 3.3 g/dL — ABNORMAL LOW (ref 3.5–5.0)
Alkaline Phosphatase: 80 U/L (ref 38–126)
Anion gap: 9 (ref 5–15)
BUN: 20 mg/dL (ref 8–23)
CO2: 25 mmol/L (ref 22–32)
Calcium: 8.9 mg/dL (ref 8.9–10.3)
Chloride: 104 mmol/L (ref 98–111)
Creatinine, Ser: 0.73 mg/dL (ref 0.44–1.00)
GFR, Estimated: >60 mL/min (ref >60)
Glucose, Bld: 98 mg/dL (ref 70–99)
Potassium: 4 mmol/L (ref 3.5–5.1)
Sodium: 138 mmol/L (ref 135–145)
Total Bilirubin: 1 mg/dL (ref 0.0–1.2)
Total Protein: 6.4 g/dL — ABNORMAL LOW (ref 6.5–8.1)

## 2023-05-23 LAB — CBC WITH DIFFERENTIAL/PLATELET
Abs Immature Granulocytes: 0.05 10*3/uL (ref 0.00–0.07)
Basophils Absolute: 0 10*3/uL (ref 0.0–0.1)
Basophils Relative: 1 %
Eosinophils Absolute: 0.2 10*3/uL (ref 0.0–0.5)
Eosinophils Relative: 5 %
HCT: 30.9 % — ABNORMAL LOW (ref 36.0–46.0)
Hemoglobin: 9.2 g/dL — ABNORMAL LOW (ref 12.0–15.0)
Immature Granulocytes: 1 %
Lymphocytes Relative: 17 %
Lymphs Abs: 0.6 10*3/uL — ABNORMAL LOW (ref 0.7–4.0)
MCH: 30.3 pg (ref 26.0–34.0)
MCHC: 29.8 g/dL — ABNORMAL LOW (ref 30.0–36.0)
MCV: 101.6 fL — ABNORMAL HIGH (ref 80.0–100.0)
Monocytes Absolute: 0.5 10*3/uL (ref 0.1–1.0)
Monocytes Relative: 13 %
Neutro Abs: 2.4 10*3/uL (ref 1.7–7.7)
Neutrophils Relative %: 63 %
Platelets: 105 10*3/uL — ABNORMAL LOW (ref 150–400)
RBC: 3.04 MIL/uL — ABNORMAL LOW (ref 3.87–5.11)
RDW: 16.6 % — ABNORMAL HIGH (ref 11.5–15.5)
WBC: 3.8 10*3/uL — ABNORMAL LOW (ref 4.0–10.5)
nRBC: 0 % (ref 0.0–0.2)

## 2023-05-23 LAB — RETICULOCYTES
Immature Retic Fract: 26.8 % — ABNORMAL HIGH (ref 2.3–15.9)
RBC.: 2.99 MIL/uL — ABNORMAL LOW (ref 3.87–5.11)
Retic Count, Absolute: 55.3 10*3/uL (ref 19.0–186.0)
Retic Ct Pct: 1.9 % (ref 0.4–3.1)

## 2023-05-23 LAB — IRON AND TIBC
Iron: 51 ug/dL (ref 28–170)
Saturation Ratios: 16 % (ref 10.4–31.8)
TIBC: 320 ug/dL (ref 250–450)
UIBC: 269 ug/dL

## 2023-05-23 LAB — FERRITIN: Ferritin: 212 ng/mL (ref 11–307)

## 2023-05-23 LAB — IMMATURE PLATELET FRACTION: Immature Platelet Fraction: 4.8 % (ref 1.2–8.6)

## 2023-05-23 NOTE — Patient Instructions (Addendum)
 [facility] at [facility] **VISIT SUMMARY & IMPORTANT INSTRUCTIONS **   You were seen today by Rojelio Brenner PA-C for your follow-up visit.    You continue to have anemia (low hemoglobin/red blood cells) as well as low platelets and low white blood cells.  This has continued even after correcting your iron and B12 levels.  We will schedule you for bone marrow biopsy to make sure you do not have any evidence of bone marrow disorders or MDS (a low-grade blood cancer called "myelodysplastic syndrome").  Continue taking vitamin B12 supplement 1000 mcg every other day (or 500 mcg daily).  FOLLOW-UP APPOINTMENT: Office visit with Dr. Ellin Saba in about 4 weeks (1 week after bone marrow biopsy)  ** Thank you for trusting me with your healthcare!  I strive to provide all of my patients with quality care at each visit.  If you receive a survey for this visit, I would be so grateful to you for taking the time to provide feedback.  Thank you in advance!  ~ Roshell Brigham                   Dr. Doreatha Massed   &   Rojelio Brenner, PA-C   - - - - - - - - - - - - - - - - - -    Thank you for choosing [facility] at [facility] to provide your oncology and hematology care.  To afford each patient quality time with our provider, please arrive at least 15 minutes before your scheduled appointment time.   If you have a lab appointment with the Cancer Center please come in thru the Main Entrance and check in at the main information desk.  You need to re-schedule your appointment should you arrive 10 or more minutes late.  We strive to give you quality time with our providers, and arriving late affects you and other patients whose appointments are after yours.  Also, if you no show three or more times for appointments you may be dismissed from the clinic at the providers discretion.     Again, thank you for choosing [facility].  Our hope is that  these requests will decrease the amount of time that you wait before being seen by our physicians.       _____________________________________________________________  Should you have questions after your visit to [facility], please contact our office at [phone] and follow the prompts.  Our office hours are 8:00 a.m. and 4:30 p.m. Monday - Friday.  Please note that voicemails left after 4:00 p.m. may not be returned until the following business day.  We are closed weekends and major holidays.  You do have access to a nurse 24-7, just call the main number to the clinic [phone] and do not press any options, hold on the line and a nurse will answer the phone.    For prescription refill requests, have your pharmacy contact our office and allow 72 hours.

## 2023-05-24 ENCOUNTER — Encounter (HOSPITAL_COMMUNITY): Payer: Self-pay | Admitting: Physician Assistant

## 2023-05-24 ENCOUNTER — Ambulatory Visit (HOSPITAL_COMMUNITY)
Admission: RE | Admit: 2023-05-24 | Discharge: 2023-05-24 | Disposition: A | Discharge status: Home / Self Care | Payer: PPO | Source: Ambulatory Visit | Attending: Physician Assistant | Admitting: Physician Assistant

## 2023-05-24 VITALS — BP 148/60 | HR 52 | Ht 64.0 in | Wt 238.8 lb

## 2023-05-24 DIAGNOSIS — I480 Paroxysmal atrial fibrillation: Secondary | ICD-10-CM | POA: Insufficient documentation

## 2023-05-24 DIAGNOSIS — Z6841 Body Mass Index (BMI) 40.0 and over, adult: Secondary | ICD-10-CM | POA: Insufficient documentation

## 2023-05-24 DIAGNOSIS — Z79899 Other long term (current) drug therapy: Secondary | ICD-10-CM | POA: Insufficient documentation

## 2023-05-24 DIAGNOSIS — Z7984 Long term (current) use of oral hypoglycemic drugs: Secondary | ICD-10-CM | POA: Diagnosis not present

## 2023-05-24 DIAGNOSIS — Z5181 Encounter for therapeutic drug level monitoring: Secondary | ICD-10-CM | POA: Insufficient documentation

## 2023-05-24 DIAGNOSIS — I110 Hypertensive heart disease with heart failure: Secondary | ICD-10-CM | POA: Diagnosis not present

## 2023-05-24 DIAGNOSIS — D6869 Other thrombophilia: Secondary | ICD-10-CM | POA: Diagnosis not present

## 2023-05-24 DIAGNOSIS — Z794 Long term (current) use of insulin: Secondary | ICD-10-CM | POA: Insufficient documentation

## 2023-05-24 DIAGNOSIS — Z7901 Long term (current) use of anticoagulants: Secondary | ICD-10-CM | POA: Diagnosis not present

## 2023-05-24 DIAGNOSIS — E119 Type 2 diabetes mellitus without complications: Secondary | ICD-10-CM | POA: Diagnosis not present

## 2023-05-24 DIAGNOSIS — I5032 Chronic diastolic (congestive) heart failure: Secondary | ICD-10-CM | POA: Diagnosis not present

## 2023-05-24 DIAGNOSIS — Z8673 Personal history of transient ischemic attack (TIA), and cerebral infarction without residual deficits: Secondary | ICD-10-CM | POA: Insufficient documentation

## 2023-05-24 DIAGNOSIS — E785 Hyperlipidemia, unspecified: Secondary | ICD-10-CM | POA: Insufficient documentation

## 2023-05-24 NOTE — Progress Notes (Signed)
 Primary Care Physician: Joycelyn Rua, MD Primary Cardiologist: Dr Herbie Baltimore Primary Electrophysiologist: Dr Elberta Fortis Referring Physician: Dr Arlyn Leak [name] is a 78 y.o. female with a history of DM, HTN, GERD, HLD, prior CVA, CHF, atrial fibrillation who presents for follow up in the [facility]. 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 The device clinic received an alert for two, 4-minute episodes of afib on 08/06/19. Patient has a CHADS2VASC score of 6. She was unaware of her arrhythmia. She does state that since her stroke she has had issues with balance and nausea. She denies significant alcohol use or snoring. Started on flecainide 01/2022 for increased afib burden.   Patient returns for follow up for atrial fibrillation and flecainide monitoring. She reports that she has done well since her last visit. Her ILR shows 0.2% afib burden with her last afib episode occurring in December. No bleeding issues on anticoagulation. Her orthopnea has resolved.   Today, he denies symptoms of palpitations, chest pain, shortness of breath, orthopnea, PND, dizziness, presyncope, syncope, snoring, daytime somnolence, bleeding, or neurologic sequela. The patient is tolerating medications without difficulties and is otherwise without complaint today.     Atrial Fibrillation Risk Factors:  she does not have symptoms or diagnosis of sleep apnea. she does not have a history of rheumatic fever. she does not have a history of alcohol use. The patient does not have a history of early familial atrial fibrillation or other arrhythmias.   Atrial Fibrillation Management history:  Previous antiarrhythmic drugs: flecainide  Previous cardioversions: none Previous ablations: none Anticoagulation history: Eliquis   Past Medical History:  Diagnosis Date   Actinic keratosis    Followed by Dr. Terri Piedra   Anxiety     Diabetes mellitus, type II, insulin dependent (HCC)    Associated with obesity: She takes Lantus, Victoza & Actos   Diplopia    Essential hypertension    GERD (gastroesophageal reflux disease)    Hyperlipidemia    Morbid obesity with BMI of 40.0-44.9, adult (HCC)    Stroke (HCC) 2021    Current Outpatient Medications  Medication Sig Dispense Refill   acetaminophen (TYLENOL) 650 MG CR tablet Take 650 mg by mouth every 8 (eight) hours as needed for pain.     alendronate (FOSAMAX) 70 MG tablet Take 70 mg by mouth once a week. Take with a full glass of water on an empty stomach.     ALPRAZolam (XANAX) 0.25 MG tablet Take 0.25 mg by mouth 3 (three) times daily as needed.     apixaban (ELIQUIS) 5 MG TABS tablet TAKE ONE TABLET BY MOUTH EVERY MORNING and TAKE ONE TABLET BY MOUTH EVERY EVENING 180 tablet 1   atorvastatin (LIPITOR) 40 MG tablet Take 1 tablet (40 mg total) by mouth daily. 30 tablet 2   blood glucose meter kit and supplies KIT Dispense based on patient and insurance preference. Use up to four times daily as directed. (FOR ICD-9 250.00, 250.01). 1 each 0   carvedilol (COREG) 12.5 MG tablet Take 1 tablet (12.5 mg total) by mouth 2 (two) times daily. 180 tablet 2   diphenhydramine-acetaminophen (TYLENOL PM) 25-500 MG TABS tablet Take 2 tablets by mouth at bedtime.     flecainide (TAMBOCOR) 50 MG tablet Take 1 tablet by mouth twice daily 180 tablet 1   furosemide (LASIX) 40 MG tablet Take 1 tablet (40 mg total) by mouth daily. 90 tablet 3  Insulin Syringe-Needle U-100 (RELION INSULIN SYR .3CC/29G) 29G X 1/2" 0.3 ML MISC 1 each by Does not apply route in the morning and at bedtime. 100 each 0   Lancets (ONETOUCH DELICA PLUS LANCET33G) MISC USE 1 LANCET TO CHECK GLUCOSE UP TO 4 TIMES DAILY     LANTUS SOLOSTAR 100 UNIT/ML Solostar Pen Inject 30 Units into the skin in the morning.     lisinopril-hydrochlorothiazide (ZESTORETIC) 20-25 MG tablet Take 1 tablet by mouth daily.     nitroGLYCERIN  (NITROSTAT) 0.4 MG SL tablet Place 1 tablet (0.4 mg total) under the tongue every 5 (five) minutes as needed for chest pain. 25 tablet 5   ONETOUCH VERIO test strip USE 1 STRIP TO CHECK GLUCOSE UP TO 4 TIMES DAILY AS DIRECTED     pioglitazone (ACTOS) 45 MG tablet Take 45 mg by mouth daily.     RELION PEN NEEDLES 31G X 6 MM MISC USE 1 IN THE MORNING AND AT BEDTIME     No current facility-administered medications for this encounter.    ROS- All systems are reviewed and negative except as per the HPI above.  Physical Exam: Vitals:   05/24/23 0917  BP: (!) 148/60  Pulse: (!) 52  Weight: 108.3 kg  Height: 5\' 4"  (1.626 m)    GEN: Well nourished, well developed in no acute distress NECK: No JVD; No carotid bruits CARDIAC: Regular rate and rhythm, no murmurs, rubs, gallops RESPIRATORY:  Clear to auscultation without rales, wheezing or rhonchi  ABDOMEN: Soft, non-tender, non-distended EXTREMITIES:  non pitting edema, chronic stasis changes, No deformity    Wt Readings from Last 3 Encounters:  05/24/23 108.3 kg  05/23/23 108.4 kg  04/19/23 110.1 kg    EKG today demonstrates  SB, 1st degree AV block Vent. rate 52 BPM PR interval 218 ms QRS duration 94 ms QT/QTcB 478/444 ms   Echo 11/26/22 demonstrated   1. Left ventricular ejection fraction, by estimation, is 60 to 65%. The  left ventricle has normal function. The left ventricle has no regional  wall motion abnormalities. There is mild left ventricular hypertrophy.  Left ventricular diastolic parameters are consistent with Grade II diastolic dysfunction (pseudonormalization). Elevated left atrial pressure.   2. Right ventricular systolic function is normal. The right ventricular  size is mildly enlarged. There is moderately elevated pulmonary artery  systolic pressure. The estimated right ventricular systolic pressure is  49.3 mmHg.   3. Left atrial size was moderately dilated.   4. A small pericardial effusion is present.    5. The mitral valve is degenerative. Trivial mitral valve regurgitation.  Severe mitral annular calcification.   6. The aortic valve was not well visualized. There is moderate  calcification of the aortic valve. Aortic valve regurgitation is not  visualized. Mild to moderate aortic valve stenosis. Vmax 2.3 m/s, MG 11  mmHg, AVA 1.3 cm^2, DI 0.5   7. The inferior vena cava is dilated in size with <50% respiratory  variability, suggesting right atrial pressure of 15 mmHg.    Epic records are reviewed at length today  CHA2DS2-VASc Score = 8  The patient's score is based upon: CHF History: 1 HTN History: 1 Diabetes History: 1 Stroke History: 2 Vascular Disease History: 0 Age Score: 2 Gender Score: 1       ASSESSMENT AND PLAN: Paroxysmal Atrial Fibrillation (ICD10:  I48.0) The patient's CHA2DS2-VASc score is 8, indicating a 10.8% annual risk of stroke.   ILR shows 0.2% afib burden Continue flecainide 50  mg BID Continue Eliquis 5 mg BID Continue carvedilol 12.5 mg BID  Secondary Hypercoagulable State (ICD10:  D68.69) The patient is at significant risk for stroke/thromboembolism based upon her CHA2DS2-VASc Score of 8.  Continue Apixaban (Eliquis).   High Risk Medication Monitoring (ICD 10: Z79.899) PR interval mildly prolonged, still acceptable for flecainide.  Obesity Body mass index is 40.99 kg/m.  Encouraged lifestyle modification  HTN Stable on current regimen  Chronic HFpEF EF 60-65% GDMT per primary cardiology team Fluid status appears stable today   Follow up in the AF clinic in 6 months.    Jorja Loa PA-C [facility] [address] [place], [place] [address] [phone] 05/24/2023 9:28 AM

## 2023-05-30 ENCOUNTER — Ambulatory Visit (INDEPENDENT_AMBULATORY_CARE_PROVIDER_SITE_OTHER): Payer: PPO

## 2023-05-30 DIAGNOSIS — I639 Cerebral infarction, unspecified: Secondary | ICD-10-CM

## 2023-05-30 LAB — CUP PACEART REMOTE DEVICE CHECK
Date Time Interrogation Session: 20250302230031
Implantable Pulse Generator Implant Date: 20210505

## 2023-05-31 ENCOUNTER — Inpatient Hospital Stay: Payer: PPO | Attending: Hematology

## 2023-05-31 DIAGNOSIS — Z79899 Other long term (current) drug therapy: Secondary | ICD-10-CM | POA: Diagnosis not present

## 2023-05-31 DIAGNOSIS — D696 Thrombocytopenia, unspecified: Secondary | ICD-10-CM | POA: Diagnosis not present

## 2023-05-31 DIAGNOSIS — D61818 Other pancytopenia: Secondary | ICD-10-CM

## 2023-06-01 LAB — ERYTHROPOIETIN: Erythropoietin: 28.5 m[IU]/mL — ABNORMAL HIGH (ref 2.6–18.5)

## 2023-06-02 NOTE — Progress Notes (Signed)
 Carelink Summary Report / Loop Recorder

## 2023-06-10 ENCOUNTER — Other Ambulatory Visit: Payer: Self-pay | Admitting: Radiology

## 2023-06-10 DIAGNOSIS — D61818 Other pancytopenia: Secondary | ICD-10-CM

## 2023-06-13 ENCOUNTER — Ambulatory Visit (HOSPITAL_COMMUNITY)
Admission: RE | Admit: 2023-06-13 | Discharge: 2023-06-13 | Disposition: A | Discharge status: Home / Self Care | Payer: PPO | Source: Ambulatory Visit | Attending: Physician Assistant

## 2023-06-13 ENCOUNTER — Encounter (HOSPITAL_COMMUNITY): Payer: Self-pay

## 2023-06-13 ENCOUNTER — Other Ambulatory Visit: Payer: Self-pay

## 2023-06-13 ENCOUNTER — Ambulatory Visit (HOSPITAL_COMMUNITY)
Admission: RE | Admit: 2023-06-13 | Discharge: 2023-06-13 | Disposition: A | Discharge status: Home / Self Care | Payer: PPO | Source: Ambulatory Visit | Attending: Physician Assistant | Admitting: Physician Assistant

## 2023-06-13 DIAGNOSIS — Z8673 Personal history of transient ischemic attack (TIA), and cerebral infarction without residual deficits: Secondary | ICD-10-CM | POA: Insufficient documentation

## 2023-06-13 DIAGNOSIS — Z7901 Long term (current) use of anticoagulants: Secondary | ICD-10-CM | POA: Insufficient documentation

## 2023-06-13 DIAGNOSIS — Z823 Family history of stroke: Secondary | ICD-10-CM | POA: Insufficient documentation

## 2023-06-13 DIAGNOSIS — Z794 Long term (current) use of insulin: Secondary | ICD-10-CM | POA: Diagnosis not present

## 2023-06-13 DIAGNOSIS — E785 Hyperlipidemia, unspecified: Secondary | ICD-10-CM | POA: Diagnosis not present

## 2023-06-13 DIAGNOSIS — D539 Nutritional anemia, unspecified: Secondary | ICD-10-CM | POA: Diagnosis not present

## 2023-06-13 DIAGNOSIS — Z8349 Family history of other endocrine, nutritional and metabolic diseases: Secondary | ICD-10-CM | POA: Insufficient documentation

## 2023-06-13 DIAGNOSIS — Z1379 Encounter for other screening for genetic and chromosomal anomalies: Secondary | ICD-10-CM | POA: Insufficient documentation

## 2023-06-13 DIAGNOSIS — F419 Anxiety disorder, unspecified: Secondary | ICD-10-CM | POA: Insufficient documentation

## 2023-06-13 DIAGNOSIS — D61818 Other pancytopenia: Secondary | ICD-10-CM | POA: Insufficient documentation

## 2023-06-13 DIAGNOSIS — I10 Essential (primary) hypertension: Secondary | ICD-10-CM | POA: Diagnosis not present

## 2023-06-13 DIAGNOSIS — Z8249 Family history of ischemic heart disease and other diseases of the circulatory system: Secondary | ICD-10-CM | POA: Diagnosis not present

## 2023-06-13 DIAGNOSIS — D704 Cyclic neutropenia: Secondary | ICD-10-CM | POA: Diagnosis not present

## 2023-06-13 DIAGNOSIS — Z7984 Long term (current) use of oral hypoglycemic drugs: Secondary | ICD-10-CM | POA: Insufficient documentation

## 2023-06-13 DIAGNOSIS — D696 Thrombocytopenia, unspecified: Secondary | ICD-10-CM | POA: Diagnosis not present

## 2023-06-13 DIAGNOSIS — Z833 Family history of diabetes mellitus: Secondary | ICD-10-CM | POA: Diagnosis not present

## 2023-06-13 DIAGNOSIS — E119 Type 2 diabetes mellitus without complications: Secondary | ICD-10-CM | POA: Diagnosis not present

## 2023-06-13 DIAGNOSIS — D7589 Other specified diseases of blood and blood-forming organs: Secondary | ICD-10-CM | POA: Diagnosis not present

## 2023-06-13 LAB — GLUCOSE, CAPILLARY: Glucose-Capillary: 140 mg/dL — ABNORMAL HIGH (ref 70–99)

## 2023-06-13 LAB — CBC WITH DIFFERENTIAL/PLATELET
Abs Immature Granulocytes: 0.01 10*3/uL (ref 0.00–0.07)
Basophils Absolute: 0 10*3/uL (ref 0.0–0.1)
Basophils Relative: 0 %
Eosinophils Absolute: 0.2 10*3/uL (ref 0.0–0.5)
Eosinophils Relative: 5 %
HCT: 32.8 % — ABNORMAL LOW (ref 36.0–46.0)
Hemoglobin: 9.8 g/dL — ABNORMAL LOW (ref 12.0–15.0)
Immature Granulocytes: 0 %
Lymphocytes Relative: 17 %
Lymphs Abs: 0.9 10*3/uL (ref 0.7–4.0)
MCH: 30.5 pg (ref 26.0–34.0)
MCHC: 29.9 g/dL — ABNORMAL LOW (ref 30.0–36.0)
MCV: 102.2 fL — ABNORMAL HIGH (ref 80.0–100.0)
Monocytes Absolute: 0.7 10*3/uL (ref 0.1–1.0)
Monocytes Relative: 14 %
Neutro Abs: 3.1 10*3/uL (ref 1.7–7.7)
Neutrophils Relative %: 64 %
Platelets: 121 10*3/uL — ABNORMAL LOW (ref 150–400)
RBC: 3.21 MIL/uL — ABNORMAL LOW (ref 3.87–5.11)
RDW: 15.8 % — ABNORMAL HIGH (ref 11.5–15.5)
WBC: 4.9 10*3/uL (ref 4.0–10.5)
nRBC: 0 % (ref 0.0–0.2)

## 2023-06-13 MED ORDER — FLUMAZENIL 0.5 MG/5ML IV SOLN
INTRAVENOUS | Status: AC
  Filled 2023-06-13: qty 5

## 2023-06-13 MED ORDER — MIDAZOLAM HCL 2 MG/2ML IJ SOLN
INTRAMUSCULAR | Status: AC | PRN
  Administered 2023-06-13: 1 mg via INTRAVENOUS

## 2023-06-13 MED ORDER — FENTANYL CITRATE (PF) 100 MCG/2ML IJ SOLN
INTRAMUSCULAR | Status: AC
  Filled 2023-06-13: qty 2

## 2023-06-13 MED ORDER — MIDAZOLAM HCL 2 MG/2ML IJ SOLN
INTRAMUSCULAR | Status: AC | PRN
  Administered 2023-06-13: .5 mg via INTRAVENOUS

## 2023-06-13 MED ORDER — MIDAZOLAM HCL 2 MG/2ML IJ SOLN
INTRAMUSCULAR | Status: AC
  Filled 2023-06-13: qty 2

## 2023-06-13 MED ORDER — SODIUM CHLORIDE 0.9 % IV SOLN: INTRAVENOUS | Status: DC

## 2023-06-13 MED ORDER — NALOXONE HCL 0.4 MG/ML IJ SOLN
INTRAMUSCULAR | Status: AC
  Filled 2023-06-13: qty 1

## 2023-06-13 MED ORDER — FENTANYL CITRATE (PF) 100 MCG/2ML IJ SOLN
INTRAMUSCULAR | Status: AC | PRN
  Administered 2023-06-13: 50 ug via INTRAVENOUS

## 2023-06-13 NOTE — H&P (Signed)
 Chief Complaint: Patient was seen in consultation today for pancytopenia.  Referring Physician(s): Pennington,Rebekah M  Supervising Physician: Malachy Moan  Patient Status: [facility] - Out-pt  History of Present Illness: [name] is a 78 y.o. female with a past medical history significant for anxiety, GERD, HTN, HLD, CVA on Eliquis, DM, normocytic anemia and pancytopenia of unknown etiology who presents today for bone marrow aspiration/biopsy. [name] was noted to have persistent pancytopenia and normocytic anemia of unknown etiology associated with easy bruising at insulin injection sites. She was referred to heme/onc in late 2024 for further evaluation. She underwent B12 injections and IV iron without improvement in in pancytopenia. Further lab work up has been unrevealing thus far. She has been referred to IR for a bone marrow aspiration/biopsy to further direct care.  Patient seen in holding area with her son present, she reports feeling anxious and when that happens her BP usually goes high. She took her BP medications this morning as directed. She denies any other complaints and is ready to proceed.  Past Medical History:  Diagnosis Date   Actinic keratosis    Followed by Dr. Terri Piedra   Anxiety    Diabetes mellitus, type II, insulin dependent (HCC)    Associated with obesity: She takes Lantus, Victoza & Actos   Diplopia    Essential hypertension    GERD (gastroesophageal reflux disease)    Hyperlipidemia    Morbid obesity with BMI of 40.0-44.9, adult [facility])    Stroke (HCC) 2021    Past Surgical History:  Procedure Laterality Date   BREAST BIOPSY  1997   CARPAL TUNNEL RELEASE Right 11/12/2021   Procedure: CARPAL TUNNEL RELEASE;  Surgeon: Oliver Barre, MD;  Location: [facility] ORS;  Service: Orthopedics;  Laterality: Right;  pt knows to arrive at 6:15   LEFT HEART CATH AND CORONARY ANGIOGRAPHY N/A 01/05/2017   Procedure: LEFT HEART CATH AND CORONARY ANGIOGRAPHY;   Surgeon: Marykay Lex, MD;  Location: [facility] INVASIVE CV LAB;  Service: Cardiovascular: Angiographically normal coronary arteries with a right dominant system.  Normal EF 55-65%.   LOOP RECORDER INSERTION N/A 08/01/2019   Procedure: LOOP RECORDER INSERTION;  Surgeon: Regan Lemming, MD;  Location: [facility] INVASIVE CV LAB;  Service: Cardiovascular;  Laterality: N/A;    Allergies: Livalo [pitavastatin] and Sulfa antibiotics  Medications: Prior to Admission medications   Medication Sig Start Date End Date Taking? Authorizing Provider  acetaminophen (TYLENOL) 650 MG CR tablet Take 650 mg by mouth every 8 (eight) hours as needed for pain.   Yes [provider]  alendronate (FOSAMAX) 70 MG tablet Take 70 mg by mouth once a week. Take with a full glass of water on an empty stomach.   Yes [provider]  apixaban (ELIQUIS) 5 MG TABS tablet TAKE ONE TABLET BY MOUTH EVERY MORNING and TAKE ONE TABLET BY MOUTH EVERY EVENING 01/31/23  Yes Fenton, Clint R, PA  blood glucose meter kit and supplies KIT Dispense based on patient and insurance preference. Use up to four times daily as directed. (FOR ICD-9 250.00, 250.01). 08/02/19  Yes Ghimire, Lyndel Safe, MD  carvedilol (COREG) 12.5 MG tablet Take 1 tablet (12.5 mg total) by mouth 2 (two) times daily. 11/16/19  Yes Camnitz, Andree Coss, MD  Insulin Syringe-Needle U-100 (RELION INSULIN SYR .3CC/29G) 29G X 1/2" 0.3 ML MISC 1 each by Does not apply route in the morning and at bedtime. 08/02/19  Yes Dorcas Carrow, MD  Lancets [facility] DELICA PLUS [place]) MISC USE  1 LANCET TO CHECK GLUCOSE UP TO 4 TIMES DAILY 08/02/19  Yes [provider]  LANTUS SOLOSTAR 100 UNIT/ML Solostar Pen Inject 30 Units into the skin in the morning. 11/12/19  Yes [provider]  lisinopril-hydrochlorothiazide (ZESTORETIC) 20-25 MG tablet Take 1 tablet by mouth daily. 09/17/19  Yes [provider]  ONETOUCH VERIO test strip USE 1 STRIP TO CHECK GLUCOSE UP TO  4 TIMES DAILY AS DIRECTED 08/02/19  Yes [provider]  RELION PEN NEEDLES 31G X 6 MM MISC USE 1 IN THE MORNING AND AT BEDTIME 08/02/19  Yes [provider]  ALPRAZolam (XANAX) 0.25 MG tablet Take 0.25 mg by mouth 3 (three) times daily as needed. 11/09/21   [provider]  atorvastatin (LIPITOR) 40 MG tablet Take 1 tablet (40 mg total) by mouth daily. 08/03/19 05/24/23  Dorcas Carrow, MD  diphenhydramine-acetaminophen (TYLENOL PM) 25-500 MG TABS tablet Take 2 tablets by mouth at bedtime.    [provider]  flecainide (TAMBOCOR) 50 MG tablet Take 1 tablet by mouth twice daily 04/06/23   Fenton, Clint R, PA  furosemide (LASIX) 40 MG tablet Take 1 tablet (40 mg total) by mouth daily. 12/07/22 05/24/23  Carlos Levering, NP  nitroGLYCERIN (NITROSTAT) 0.4 MG SL tablet Place 1 tablet (0.4 mg total) under the tongue every 5 (five) minutes as needed for chest pain. 12/31/16 05/24/23  Marykay Lex, MD  pioglitazone (ACTOS) 45 MG tablet Take 45 mg by mouth daily.    [provider]     Family History  Problem Relation Age of Onset   Diabetes Mother    Hyperlipidemia Mother    Stroke Mother 49   Liver cancer Father    Hypertension Sister    Hyperlipidemia Sister    Diabetes Sister        On insulin   Depression Sister    Kidney disease Brother        After dialysis he had renal transplant   Diabetes Brother    Coronary artery disease Brother    Peripheral Artery Disease Brother    Stroke Brother 81   Bladder Cancer Brother    Diabetes Brother     Social History   Socioeconomic History   Marital status: Widowed    Spouse name: Not on file   Number of children: 3   Years of education: 100   Highest education level: Not on file  Occupational History    Comment: Retired  Tobacco Use   Smoking status: Never   Smokeless tobacco: Never   Tobacco comments:    Never smoke 12/16/21  Substance and Sexual Activity   Alcohol use: No   Drug use: Never    Sexual activity: Not Currently  Other Topics Concern   Not on file  Social History Narrative   She is a widowed mother of 3 with 5 grandchildren. She lives with one of her daughters and grandson. She is a coming by her sister. She tries to walk, but does not do it regularly.   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   Coffee every morning, tea rare   Right handed    Social Drivers of Health   Financial Resource Strain: Not on file  Food Insecurity: No Food Insecurity (01/04/2023)   Hunger Vital Sign    Worried About Running Out of Food in the Last Year: Never true    Ran Out of Food in the Last Year: Never true  Transportation  Needs: No Transportation Needs (01/04/2023)   PRAPARE - Administrator, Civil Service (Medical): No    Lack of Transportation (Non-Medical): No  Physical Activity: Not on file  Stress: Not on file  Social Connections: Not on file     Review of Systems: A 12 point ROS discussed and pertinent positives are indicated in the HPI above.  All other systems are negative.  Review of Systems  Constitutional:  Negative for chills and fever.  Respiratory:  Negative for cough and shortness of breath.   Cardiovascular:  Negative for chest pain.  Gastrointestinal:  Negative for abdominal pain, diarrhea, nausea and vomiting.  Musculoskeletal:  Negative for back pain.  Neurological:  Negative for dizziness and headaches.    Vital Signs: BP (!) 192/64   Pulse (!) 50   Temp 97.9 F (36.6 C) (Oral)   Resp 18   Ht 5\' 7"  (1.702 m)   Wt 238 lb (108 kg)   SpO2 97%   BMI 37.28 kg/m   Physical Exam Vitals reviewed.  Constitutional:      General: She is not in acute distress. HENT:     Head: Normocephalic.     Mouth/Throat:     Mouth: Mucous membranes are moist.     Pharynx: Oropharynx is clear. No oropharyngeal exudate or posterior oropharyngeal erythema.  Cardiovascular:     Rate and Rhythm: Normal rate and regular rhythm.      Heart sounds: Murmur heard.  Pulmonary:     Effort: Pulmonary effort is normal.     Breath sounds: Normal breath sounds.  Abdominal:     General: There is no distension.     Palpations: Abdomen is soft.     Tenderness: There is no abdominal tenderness.  Skin:    General: Skin is warm and dry.  Neurological:     Mental Status: She is alert and oriented to person, place, and time.  Psychiatric:        Mood and Affect: Mood normal.        Behavior: Behavior normal.        Thought Content: Thought content normal.        Judgment: Judgment normal.      MD Evaluation Airway: WNL Heart: WNL Abdomen: WNL Chest/ Lungs: WNL ASA  Classification: 3 Mallampati/Airway Score: Two   Imaging: CUP PACEART REMOTE DEVICE CHECK Result Date: 05/30/2023 ILR summary report received. Battery status OK. Normal device function. No new symptom, tachy, brady, or pause episodes. No new AF episodes. Monthly summary reports and ROV/PRN [place], CV[facility]   Labs:  CBC: Recent Labs    01/04/23 1105 04/04/23 1003 05/23/23 0819  WBC 4.1 3.6* 3.8*  HGB 8.4* 9.4* 9.2*  HCT 27.1* 30.6* 30.9*  PLT 120* 113* 105*    COAGS: No results for input(s): "INR", "APTT" in the last 8760 hours.  BMP: Recent Labs    11/23/22 0911 12/07/22 1019 12/21/22 0913 05/23/23 0819  NA 138 140 141 138  K 3.5 4.6 4.6 4.0  CL 103 101 101 104  CO2 24 28 29 25   GLUCOSE 95 103* 120* 98  BUN 22 30* 29* 20  CALCIUM 8.7* 9.0 9.1 8.9  CREATININE 1.01* 1.04* 1.03* 0.73  GFRNONAA 57*  --   --  >60    LIVER FUNCTION TESTS: Recent Labs    05/23/23 0819  BILITOT 1.0  AST 30  ALT 29  ALKPHOS 80  PROT 6.4*  ALBUMIN 3.3*    TUMOR MARKERS:  No results for input(s): "AFPTM", "CEA", "CA199", "CHROMGRNA" in the last 8760 hours.  Assessment and Plan:  78 y/o F with history of persistent normocytic anemia and pancytopenia of unknown etiology who presents today for a bone marrow aspiration/biopsy for further  evaluation.  Risks and benefits of bone marrow aspiration/biopsy was discussed with the patient and/or patient's family including, but not limited to bleeding, infection, damage to adjacent structures or low yield requiring additional tests.  All of the questions were answered and there is agreement to proceed.  Consent signed and in chart.  Thank you for this interesting consult.  I greatly enjoyed meeting [facility] and look forward to participating in their care.  A copy of this report was sent to the requesting provider on this date.  Electronically Signed: Villa Herb, PA-C 06/13/2023, 8:01 AM   I spent a total of  30 Minutes  in face to face in clinical consultation, greater than 50% of which was counseling/coordinating care for pancytopenia.

## 2023-06-13 NOTE — Procedures (Signed)
Interventional Radiology Procedure Note  Procedure: CT guided aspirate and core biopsy of right iliac bone Complications: None Recommendations: - Bedrest supine x 1 hrs - Hydrocodone PRN  Pain - Follow biopsy results  Signed,  Heath K. McCullough, MD   

## 2023-06-13 NOTE — Discharge Instructions (Signed)
Discharge Instructions:   Please call Interventional Radiology clinic [phone] with any questions or concerns.  You may remove your dressing and shower tomorrow.    Bone Marrow Aspiration and Bone Marrow Biopsy, Adult, Care After This sheet gives you information about how to care for yourself after your procedure. Your health care provider may also give you more specific instructions. If you have problems or questions, contact your health care provider. What can I expect after the procedure? After the procedure, it is common to have: Mild pain and tenderness. Swelling. Bruising. Follow these instructions at home: Puncture site care  Follow instructions from your health care provider about how to take care of the puncture site. Make sure you: Wash your hands with soap and water before and after you change your bandage (dressing). If soap and water are not available, use hand sanitizer. Change your dressing as told by your health care provider. Check your puncture site every day for signs of infection. Check for: More redness, swelling, or pain. Fluid or blood. Warmth. Pus or a bad smell. Activity Return to your normal activities as told by your health care provider. Ask your health care provider what activities are safe for you. Do not lift anything that is heavier than 10 lb (4.5 kg), or the limit that you are told, until your health care provider says that it is safe. Do not drive for 24 hours if you were given a sedative during your procedure. General instructions  Take over-the-counter and prescription medicines only as told by your health care provider. Do not take baths, swim, or use a hot tub until your health care provider approves. Ask your health care provider if you may take showers. You may only be allowed to take sponge baths. If directed, put ice on the affected area. To do this: Put ice in a plastic bag. Place a towel between your skin and the bag. Leave the ice  on for 20 minutes, 2-3 times a day. Keep all follow-up visits as told by your health care provider. This is important. Contact a health care provider if: Your pain is not controlled with medicine. You have a fever. You have more redness, swelling, or pain around the puncture site. You have fluid or blood coming from the puncture site. Your puncture site feels warm to the touch. You have pus or a bad smell coming from the puncture site. Summary After the procedure, it is common to have mild pain, tenderness, swelling, and bruising. Follow instructions from your health care provider about how to take care of the puncture site and what activities are safe for you. Take over-the-counter and prescription medicines only as told by your health care provider. Contact a health care provider if you have any signs of infection, such as fluid or blood coming from the puncture site. This information is not intended to replace advice given to you by your health care provider. Make sure you discuss any questions you have with your health care provider. Document Revised: 08/01/2018 Document Reviewed: 08/01/2018 Elsevier Patient Education  2023 Elsevier Inc.   Moderate Conscious Sedation, Adult, Care After This sheet gives you information about how to care for yourself after your procedure. Your health care provider may also give you more specific instructions. If you have problems or questions, contact your health care provider. What can I expect after the procedure? After the procedure, it is common to have: Sleepiness for several hours. Impaired judgment for several hours. Difficulty with balance. Vomiting if   you eat too soon. Follow these instructions at home: For the time period you were told by your health care provider: Rest. Do not participate in activities where you could fall or become injured. Do not drive or use machinery. Do not drink alcohol. Do not take sleeping pills or medicines that  cause drowsiness. Do not make important decisions or sign legal documents. Do not take care of children on your own. Eating and drinking  Follow the diet recommended by your health care provider. Drink enough fluid to keep your urine pale yellow. If you vomit: Drink water, juice, or soup when you can drink without vomiting. Make sure you have little or no nausea before eating solid foods. General instructions Take over-the-counter and prescription medicines only as told by your health care provider. Have a responsible adult stay with you for the time you are told. It is important to have someone help care for you until you are awake and alert. Do not smoke. Keep all follow-up visits as told by your health care provider. This is important. Contact a health care provider if: You are still sleepy or having trouble with balance after 24 hours. You feel light-headed. You keep feeling nauseous or you keep vomiting. You develop a rash. You have a fever. You have redness or swelling around the IV site. Get help right away if: You have trouble breathing. You have new-onset confusion at home. Summary After the procedure, it is common to feel sleepy, have impaired judgment, or feel nauseous if you eat too soon. Rest after you get home. Know the things you should not do after the procedure. Follow the diet recommended by your health care provider and drink enough fluid to keep your urine pale yellow. Get help right away if you have trouble breathing or new-onset confusion at home. This information is not intended to replace advice given to you by your health care provider. Make sure you discuss any questions you have with your health care provider. Document Revised: 07/13/2019 Document Reviewed: 02/08/2019 Elsevier Patient Education  2023 Elsevier Inc.  

## 2023-06-15 LAB — SURGICAL PATHOLOGY

## 2023-06-18 ENCOUNTER — Emergency Department (HOSPITAL_COMMUNITY): Admitting: Anesthesiology

## 2023-06-18 ENCOUNTER — Encounter (HOSPITAL_COMMUNITY): Payer: Self-pay | Admitting: *Deleted

## 2023-06-18 ENCOUNTER — Ambulatory Visit
Admission: EM | Admit: 2023-06-18 | Discharge: 2023-06-18 | Disposition: A | Discharge status: Home / Self Care | Attending: Physician Assistant | Admitting: Physician Assistant

## 2023-06-18 ENCOUNTER — Encounter (HOSPITAL_COMMUNITY)
Admission: EM | Disposition: A | Discharge status: Home / Self Care | Payer: Self-pay | Source: Ambulatory Visit | Attending: Family Medicine

## 2023-06-18 ENCOUNTER — Inpatient Hospital Stay (HOSPITAL_COMMUNITY)
Admission: EM | Admit: 2023-06-18 | Discharge: 2023-06-22 | DRG: 660 | Disposition: A | Discharge status: Home / Self Care | Source: Ambulatory Visit | Attending: Emergency Medicine | Admitting: Emergency Medicine

## 2023-06-18 ENCOUNTER — Other Ambulatory Visit: Payer: Self-pay

## 2023-06-18 ENCOUNTER — Emergency Department (HOSPITAL_COMMUNITY)

## 2023-06-18 DIAGNOSIS — Z8249 Family history of ischemic heart disease and other diseases of the circulatory system: Secondary | ICD-10-CM | POA: Diagnosis not present

## 2023-06-18 DIAGNOSIS — Z888 Allergy status to other drugs, medicaments and biological substances status: Secondary | ICD-10-CM

## 2023-06-18 DIAGNOSIS — E11649 Type 2 diabetes mellitus with hypoglycemia without coma: Secondary | ICD-10-CM | POA: Diagnosis not present

## 2023-06-18 DIAGNOSIS — K219 Gastro-esophageal reflux disease without esophagitis: Secondary | ICD-10-CM | POA: Diagnosis present

## 2023-06-18 DIAGNOSIS — D509 Iron deficiency anemia, unspecified: Secondary | ICD-10-CM | POA: Diagnosis present

## 2023-06-18 DIAGNOSIS — Z79899 Other long term (current) drug therapy: Secondary | ICD-10-CM

## 2023-06-18 DIAGNOSIS — E118 Type 2 diabetes mellitus with unspecified complications: Secondary | ICD-10-CM | POA: Diagnosis present

## 2023-06-18 DIAGNOSIS — Z818 Family history of other mental and behavioral disorders: Secondary | ICD-10-CM

## 2023-06-18 DIAGNOSIS — I5032 Chronic diastolic (congestive) heart failure: Secondary | ICD-10-CM | POA: Diagnosis present

## 2023-06-18 DIAGNOSIS — K449 Diaphragmatic hernia without obstruction or gangrene: Secondary | ICD-10-CM | POA: Diagnosis not present

## 2023-06-18 DIAGNOSIS — Z7984 Long term (current) use of oral hypoglycemic drugs: Secondary | ICD-10-CM

## 2023-06-18 DIAGNOSIS — N390 Urinary tract infection, site not specified: Principal | ICD-10-CM

## 2023-06-18 DIAGNOSIS — Z6841 Body Mass Index (BMI) 40.0 and over, adult: Secondary | ICD-10-CM

## 2023-06-18 DIAGNOSIS — E1165 Type 2 diabetes mellitus with hyperglycemia: Secondary | ICD-10-CM | POA: Diagnosis not present

## 2023-06-18 DIAGNOSIS — D696 Thrombocytopenia, unspecified: Secondary | ICD-10-CM | POA: Diagnosis present

## 2023-06-18 DIAGNOSIS — Z87442 Personal history of urinary calculi: Secondary | ICD-10-CM

## 2023-06-18 DIAGNOSIS — N136 Pyonephrosis: Principal | ICD-10-CM | POA: Diagnosis present

## 2023-06-18 DIAGNOSIS — N132 Hydronephrosis with renal and ureteral calculous obstruction: Secondary | ICD-10-CM | POA: Diagnosis not present

## 2023-06-18 DIAGNOSIS — N179 Acute kidney failure, unspecified: Secondary | ICD-10-CM | POA: Diagnosis not present

## 2023-06-18 DIAGNOSIS — E860 Dehydration: Secondary | ICD-10-CM | POA: Diagnosis not present

## 2023-06-18 DIAGNOSIS — Z7983 Long term (current) use of bisphosphonates: Secondary | ICD-10-CM

## 2023-06-18 DIAGNOSIS — Z882 Allergy status to sulfonamides status: Secondary | ICD-10-CM

## 2023-06-18 DIAGNOSIS — R1031 Right lower quadrant pain: Secondary | ICD-10-CM | POA: Diagnosis not present

## 2023-06-18 DIAGNOSIS — R310 Gross hematuria: Secondary | ICD-10-CM | POA: Diagnosis not present

## 2023-06-18 DIAGNOSIS — T383X5A Adverse effect of insulin and oral hypoglycemic [antidiabetic] drugs, initial encounter: Secondary | ICD-10-CM | POA: Diagnosis not present

## 2023-06-18 DIAGNOSIS — N3001 Acute cystitis with hematuria: Secondary | ICD-10-CM | POA: Diagnosis not present

## 2023-06-18 DIAGNOSIS — N200 Calculus of kidney: Secondary | ICD-10-CM

## 2023-06-18 DIAGNOSIS — I480 Paroxysmal atrial fibrillation: Secondary | ICD-10-CM | POA: Diagnosis not present

## 2023-06-18 DIAGNOSIS — D6869 Other thrombophilia: Secondary | ICD-10-CM | POA: Diagnosis present

## 2023-06-18 DIAGNOSIS — R109 Unspecified abdominal pain: Secondary | ICD-10-CM | POA: Diagnosis not present

## 2023-06-18 DIAGNOSIS — N201 Calculus of ureter: Secondary | ICD-10-CM

## 2023-06-18 DIAGNOSIS — K8020 Calculus of gallbladder without cholecystitis without obstruction: Secondary | ICD-10-CM | POA: Diagnosis not present

## 2023-06-18 DIAGNOSIS — Z823 Family history of stroke: Secondary | ICD-10-CM

## 2023-06-18 DIAGNOSIS — I10 Essential (primary) hypertension: Secondary | ICD-10-CM | POA: Diagnosis not present

## 2023-06-18 DIAGNOSIS — K5900 Constipation, unspecified: Secondary | ICD-10-CM | POA: Diagnosis present

## 2023-06-18 DIAGNOSIS — R82998 Other abnormal findings in urine: Secondary | ICD-10-CM | POA: Diagnosis not present

## 2023-06-18 DIAGNOSIS — Z8673 Personal history of transient ischemic attack (TIA), and cerebral infarction without residual deficits: Secondary | ICD-10-CM

## 2023-06-18 DIAGNOSIS — E66813 Obesity, class 3: Secondary | ICD-10-CM | POA: Diagnosis present

## 2023-06-18 DIAGNOSIS — I110 Hypertensive heart disease with heart failure: Secondary | ICD-10-CM | POA: Diagnosis not present

## 2023-06-18 DIAGNOSIS — Z800 Family history of malignant neoplasm of digestive organs: Secondary | ICD-10-CM

## 2023-06-18 DIAGNOSIS — R10A0 Flank pain, unspecified side: Secondary | ICD-10-CM

## 2023-06-18 DIAGNOSIS — Z83438 Family history of other disorder of lipoprotein metabolism and other lipidemia: Secondary | ICD-10-CM

## 2023-06-18 DIAGNOSIS — Z7901 Long term (current) use of anticoagulants: Secondary | ICD-10-CM

## 2023-06-18 DIAGNOSIS — Z833 Family history of diabetes mellitus: Secondary | ICD-10-CM

## 2023-06-18 DIAGNOSIS — R112 Nausea with vomiting, unspecified: Secondary | ICD-10-CM | POA: Diagnosis not present

## 2023-06-18 DIAGNOSIS — Z794 Long term (current) use of insulin: Secondary | ICD-10-CM

## 2023-06-18 DIAGNOSIS — Z841 Family history of disorders of kidney and ureter: Secondary | ICD-10-CM

## 2023-06-18 DIAGNOSIS — E785 Hyperlipidemia, unspecified: Secondary | ICD-10-CM | POA: Diagnosis present

## 2023-06-18 DIAGNOSIS — E119 Type 2 diabetes mellitus without complications: Secondary | ICD-10-CM | POA: Diagnosis not present

## 2023-06-18 DIAGNOSIS — Z8052 Family history of malignant neoplasm of bladder: Secondary | ICD-10-CM

## 2023-06-18 DIAGNOSIS — N202 Calculus of kidney with calculus of ureter: Secondary | ICD-10-CM | POA: Diagnosis not present

## 2023-06-18 HISTORY — PX: CYSTOSCOPY W/ URETERAL STENT PLACEMENT: SHX1429

## 2023-06-18 LAB — CBC WITH DIFFERENTIAL/PLATELET
Abs Immature Granulocytes: 0.04 10*3/uL (ref 0.00–0.07)
Basophils Absolute: 0 10*3/uL (ref 0.0–0.1)
Basophils Relative: 0 %
Eosinophils Absolute: 0 10*3/uL (ref 0.0–0.5)
Eosinophils Relative: 0 %
HCT: 35.8 % — ABNORMAL LOW (ref 36.0–46.0)
Hemoglobin: 11.3 g/dL — ABNORMAL LOW (ref 12.0–15.0)
Immature Granulocytes: 0 %
Lymphocytes Relative: 4 %
Lymphs Abs: 0.4 10*3/uL — ABNORMAL LOW (ref 0.7–4.0)
MCH: 30.8 pg (ref 26.0–34.0)
MCHC: 31.6 g/dL (ref 30.0–36.0)
MCV: 97.5 fL (ref 80.0–100.0)
Monocytes Absolute: 0.3 10*3/uL (ref 0.1–1.0)
Monocytes Relative: 3 %
Neutro Abs: 8.4 10*3/uL — ABNORMAL HIGH (ref 1.7–7.7)
Neutrophils Relative %: 93 %
Platelets: 134 10*3/uL — ABNORMAL LOW (ref 150–400)
RBC: 3.67 MIL/uL — ABNORMAL LOW (ref 3.87–5.11)
RDW: 15.5 % (ref 11.5–15.5)
WBC: 9.2 10*3/uL (ref 4.0–10.5)
nRBC: 0 % (ref 0.0–0.2)

## 2023-06-18 LAB — COMPREHENSIVE METABOLIC PANEL
ALT: 38 U/L (ref 0–44)
AST: 30 U/L (ref 15–41)
Albumin: 4 g/dL (ref 3.5–5.0)
Alkaline Phosphatase: 102 U/L (ref 38–126)
Anion gap: 12 (ref 5–15)
BUN: 27 mg/dL — ABNORMAL HIGH (ref 8–23)
CO2: 25 mmol/L (ref 22–32)
Calcium: 9.4 mg/dL (ref 8.9–10.3)
Chloride: 99 mmol/L (ref 98–111)
Creatinine, Ser: 0.75 mg/dL (ref 0.44–1.00)
GFR, Estimated: >60 mL/min (ref >60)
Glucose, Bld: 237 mg/dL — ABNORMAL HIGH (ref 70–99)
Potassium: 4.3 mmol/L (ref 3.5–5.1)
Sodium: 136 mmol/L (ref 135–145)
Total Bilirubin: 1.4 mg/dL — ABNORMAL HIGH (ref 0.0–1.2)
Total Protein: 7.6 g/dL (ref 6.5–8.1)

## 2023-06-18 LAB — URINALYSIS, ROUTINE W REFLEX MICROSCOPIC
Bilirubin Urine: NEGATIVE
Glucose, UA: 50 mg/dL — AB
Ketones, ur: NEGATIVE mg/dL
Nitrite: POSITIVE — AB
Protein, ur: 100 mg/dL — AB
RBC / HPF: >50 RBC/hpf (ref 0–5)
Specific Gravity, Urine: >1.046 — ABNORMAL HIGH (ref 1.005–1.030)
WBC, UA: >50 WBC/hpf (ref 0–5)
pH: 5 (ref 5.0–8.0)

## 2023-06-18 LAB — VITAMIN B12: Vitamin B-12: 6144 pg/mL — ABNORMAL HIGH (ref 180–914)

## 2023-06-18 LAB — POCT URINALYSIS DIP (MANUAL ENTRY)
Bilirubin, UA: NEGATIVE
Glucose, UA: 500 mg/dL — AB
Nitrite, UA: NEGATIVE
Protein Ur, POC: >=300 mg/dL — AB
Spec Grav, UA: 1.025
Urobilinogen, UA: 1 U/dL
pH, UA: 7

## 2023-06-18 LAB — GLUCOSE, CAPILLARY
Glucose-Capillary: 131 mg/dL — ABNORMAL HIGH (ref 70–99)
Glucose-Capillary: 151 mg/dL — ABNORMAL HIGH (ref 70–99)
Glucose-Capillary: 209 mg/dL — ABNORMAL HIGH (ref 70–99)

## 2023-06-18 LAB — IRON AND TIBC
Iron: 18 ug/dL — ABNORMAL LOW (ref 28–170)
Saturation Ratios: 6 % — ABNORMAL LOW (ref 10.4–31.8)
TIBC: 281 ug/dL (ref 250–450)
UIBC: 263 ug/dL

## 2023-06-18 LAB — FOLATE: Folate: 6.5 ng/mL (ref >5.9)

## 2023-06-18 LAB — LIPASE, BLOOD: Lipase: 27 U/L (ref 11–51)

## 2023-06-18 LAB — FERRITIN: Ferritin: 210 ng/mL (ref 11–307)

## 2023-06-18 SURGERY — CYSTOSCOPY, WITH RETROGRADE PYELOGRAM AND URETERAL STENT INSERTION
Anesthesia: General | Site: Ureter | Laterality: Right

## 2023-06-18 MED ORDER — LIDOCAINE HCL (CARDIAC) PF 100 MG/5ML IV SOSY
PREFILLED_SYRINGE | INTRAVENOUS | Status: DC | PRN
  Administered 2023-06-18: 100 mg via INTRAVENOUS

## 2023-06-18 MED ORDER — HYDROMORPHONE HCL 1 MG/ML IJ SOLN: 0.5000 mg | INTRAMUSCULAR | Status: DC | PRN

## 2023-06-18 MED ORDER — ACETAMINOPHEN 10 MG/ML IV SOLN
INTRAVENOUS | Status: DC | PRN
Start: 2023-06-18 — End: 2023-06-18
  Administered 2023-06-18: 1000 mg via INTRAVENOUS

## 2023-06-18 MED ORDER — IOHEXOL 300 MG/ML  SOLN
INTRAMUSCULAR | Status: DC | PRN
  Administered 2023-06-18: 6 mL

## 2023-06-18 MED ORDER — FENTANYL CITRATE PF 50 MCG/ML IJ SOSY
25.0000 ug | PREFILLED_SYRINGE | INTRAMUSCULAR | Status: DC | PRN
Start: 2023-06-18 — End: 2023-06-18

## 2023-06-18 MED ORDER — FUROSEMIDE 40 MG PO TABS: 40.0000 mg | ORAL_TABLET | Freq: Every day | ORAL | Status: DC

## 2023-06-18 MED ORDER — FUROSEMIDE 40 MG PO TABS
40.0000 mg | ORAL_TABLET | Freq: Every day | ORAL | Status: DC
  Administered 2023-06-19: 40 mg via ORAL
  Filled 2023-06-18: qty 1

## 2023-06-18 MED ORDER — LACTATED RINGERS IV SOLN: INTRAVENOUS | Status: DC | PRN

## 2023-06-18 MED ORDER — ONDANSETRON HCL 4 MG/2ML IJ SOLN: 4.0000 mg | Freq: Four times a day (QID) | INTRAMUSCULAR | Status: DC | PRN

## 2023-06-18 MED ORDER — DEXAMETHASONE SODIUM PHOSPHATE 10 MG/ML IJ SOLN
INTRAMUSCULAR | Status: DC | PRN
  Administered 2023-06-18: 4 mg via INTRAVENOUS

## 2023-06-18 MED ORDER — LISINOPRIL 20 MG PO TABS
20.0000 mg | ORAL_TABLET | Freq: Every day | ORAL | Status: DC
  Administered 2023-06-19: 20 mg via ORAL
  Filled 2023-06-18: qty 1

## 2023-06-18 MED ORDER — OXYCODONE HCL 5 MG PO TABS: 5.0000 mg | ORAL_TABLET | Freq: Once | ORAL | Status: DC | PRN

## 2023-06-18 MED ORDER — HYDROCHLOROTHIAZIDE 25 MG PO TABS
25.0000 mg | ORAL_TABLET | Freq: Every day | ORAL | Status: DC
  Administered 2023-06-19: 25 mg via ORAL
  Filled 2023-06-18: qty 1

## 2023-06-18 MED ORDER — ONDANSETRON HCL 4 MG/2ML IJ SOLN
INTRAMUSCULAR | Status: AC
  Filled 2023-06-18: qty 2

## 2023-06-18 MED ORDER — ATORVASTATIN CALCIUM 40 MG PO TABS
40.0000 mg | ORAL_TABLET | Freq: Every day | ORAL | Status: DC
  Administered 2023-06-19 – 2023-06-22 (×4): 40 mg via ORAL
  Filled 2023-06-18 (×4): qty 1

## 2023-06-18 MED ORDER — INSULIN ASPART 100 UNIT/ML IJ SOLN
0.0000 [IU] | Freq: Three times a day (TID) | INTRAMUSCULAR | Status: DC
  Administered 2023-06-19: 3 [IU] via SUBCUTANEOUS
  Administered 2023-06-19: 2 [IU] via SUBCUTANEOUS
  Administered 2023-06-19: 3 [IU] via SUBCUTANEOUS
  Administered 2023-06-21: 2 [IU] via SUBCUTANEOUS

## 2023-06-18 MED ORDER — FLECAINIDE ACETATE 50 MG PO TABS
50.0000 mg | ORAL_TABLET | Freq: Two times a day (BID) | ORAL | Status: DC
  Administered 2023-06-18 – 2023-06-22 (×8): 50 mg via ORAL
  Filled 2023-06-18 (×9): qty 1

## 2023-06-18 MED ORDER — OXYCODONE HCL 5 MG/5ML PO SOLN: 5.0000 mg | Freq: Once | ORAL | Status: DC | PRN

## 2023-06-18 MED ORDER — AMISULPRIDE (ANTIEMETIC) 5 MG/2ML IV SOLN: 10.0000 mg | Freq: Once | INTRAVENOUS | Status: DC | PRN

## 2023-06-18 MED ORDER — ACETAMINOPHEN 325 MG PO TABS: 650.0000 mg | ORAL_TABLET | Freq: Four times a day (QID) | ORAL | Status: DC | PRN

## 2023-06-18 MED ORDER — LIDOCAINE HCL (PF) 2 % IJ SOLN
INTRAMUSCULAR | Status: AC
  Filled 2023-06-18: qty 5

## 2023-06-18 MED ORDER — FENTANYL CITRATE (PF) 100 MCG/2ML IJ SOLN
INTRAMUSCULAR | Status: AC
  Filled 2023-06-18: qty 2

## 2023-06-18 MED ORDER — IOHEXOL 300 MG/ML  SOLN
100.0000 mL | Freq: Once | INTRAMUSCULAR | Status: AC | PRN
  Administered 2023-06-18: 100 mL via INTRAVENOUS

## 2023-06-18 MED ORDER — MORPHINE SULFATE (PF) 4 MG/ML IV SOLN
4.0000 mg | Freq: Once | INTRAVENOUS | Status: AC
  Administered 2023-06-18: 4 mg via INTRAVENOUS
  Filled 2023-06-18 (×2): qty 1

## 2023-06-18 MED ORDER — ONDANSETRON 4 MG PO TBDP
4.0000 mg | ORAL_TABLET | Freq: Once | ORAL | Status: AC
  Administered 2023-06-18: 4 mg via ORAL

## 2023-06-18 MED ORDER — APIXABAN 5 MG PO TABS
5.0000 mg | ORAL_TABLET | Freq: Two times a day (BID) | ORAL | Status: DC
  Administered 2023-06-19 – 2023-06-22 (×7): 5 mg via ORAL
  Filled 2023-06-18 (×7): qty 1

## 2023-06-18 MED ORDER — LISINOPRIL-HYDROCHLOROTHIAZIDE 20-25 MG PO TABS: 1.0000 | ORAL_TABLET | Freq: Every day | ORAL | Status: DC

## 2023-06-18 MED ORDER — DEXAMETHASONE SODIUM PHOSPHATE 10 MG/ML IJ SOLN
INTRAMUSCULAR | Status: AC
  Filled 2023-06-18: qty 1

## 2023-06-18 MED ORDER — SODIUM CHLORIDE 0.9 % IV SOLN
2.0000 g | Freq: Once | INTRAVENOUS | Status: AC
  Administered 2023-06-18: 2 g via INTRAVENOUS
  Filled 2023-06-18: qty 20

## 2023-06-18 MED ORDER — FENTANYL CITRATE (PF) 100 MCG/2ML IJ SOLN
INTRAMUSCULAR | Status: DC | PRN
  Administered 2023-06-18 (×2): 25 ug via INTRAVENOUS

## 2023-06-18 MED ORDER — SODIUM CHLORIDE 0.9 % IV SOLN
2.0000 g | INTRAVENOUS | Status: DC
  Administered 2023-06-19 – 2023-06-21 (×3): 2 g via INTRAVENOUS
  Filled 2023-06-18 (×4): qty 20

## 2023-06-18 MED ORDER — CARVEDILOL 12.5 MG PO TABS
12.5000 mg | ORAL_TABLET | Freq: Two times a day (BID) | ORAL | Status: DC
  Administered 2023-06-18 – 2023-06-22 (×7): 12.5 mg via ORAL
  Filled 2023-06-18 (×8): qty 1

## 2023-06-18 MED ORDER — WATER FOR IRRIGATION, STERILE IR SOLN
Status: DC | PRN
  Administered 2023-06-18: 3000 mL

## 2023-06-18 MED ORDER — ACETAMINOPHEN 10 MG/ML IV SOLN
INTRAVENOUS | Status: AC
  Filled 2023-06-18: qty 100

## 2023-06-18 MED ORDER — OXYCODONE HCL 5 MG PO TABS: 5.0000 mg | ORAL_TABLET | ORAL | Status: DC | PRN

## 2023-06-18 MED ORDER — PHENYLEPHRINE 80 MCG/ML (10ML) SYRINGE FOR IV PUSH (FOR BLOOD PRESSURE SUPPORT)
PREFILLED_SYRINGE | INTRAVENOUS | Status: DC | PRN
Start: 2023-06-18 — End: 2023-06-18
  Administered 2023-06-18: 160 ug via INTRAVENOUS
  Administered 2023-06-18: 80 ug via INTRAVENOUS

## 2023-06-18 MED ORDER — ONDANSETRON HCL 4 MG/2ML IJ SOLN
INTRAMUSCULAR | Status: DC | PRN
  Administered 2023-06-18: 4 mg via INTRAVENOUS

## 2023-06-18 MED ORDER — PROPOFOL 10 MG/ML IV BOLUS
INTRAVENOUS | Status: DC | PRN
  Administered 2023-06-18: 30 mg via INTRAVENOUS
  Administered 2023-06-18: 100 mg via INTRAVENOUS

## 2023-06-18 MED ORDER — ACETAMINOPHEN 650 MG RE SUPP: 650.0000 mg | Freq: Four times a day (QID) | RECTAL | Status: DC | PRN

## 2023-06-18 MED ORDER — ONDANSETRON HCL 4 MG/2ML IJ SOLN
4.0000 mg | Freq: Once | INTRAMUSCULAR | Status: AC
  Administered 2023-06-18: 4 mg via INTRAVENOUS
  Filled 2023-06-18 (×2): qty 2

## 2023-06-18 MED ORDER — INSULIN GLARGINE 100 UNIT/ML ~~LOC~~ SOLN
30.0000 [IU] | Freq: Every day | SUBCUTANEOUS | Status: DC
  Administered 2023-06-19 – 2023-06-21 (×3): 30 [IU] via SUBCUTANEOUS
  Filled 2023-06-18 (×5): qty 0.3

## 2023-06-18 SURGICAL SUPPLY — 11 items
BAG URO CATCHER STRL LF (MISCELLANEOUS) ×1 IMPLANT
CATH URETL OPEN 5X70 (CATHETERS) ×1 IMPLANT
CLOTH BEACON ORANGE TIMEOUT ST (SAFETY) ×1 IMPLANT
GLOVE SURG LX STRL 8.0 MICRO (GLOVE) ×1 IMPLANT
GOWN STRL SURGICAL XL XLNG (GOWN DISPOSABLE) ×1 IMPLANT
GUIDEWIRE ZIPWRE .038 STRAIGHT (WIRE) ×1 IMPLANT
KIT TURNOVER KIT A (KITS) IMPLANT
MANIFOLD NEPTUNE II (INSTRUMENTS) ×1 IMPLANT
PACK CYSTO (CUSTOM PROCEDURE TRAY) ×1 IMPLANT
STENT URET 6FRX24 CONTOUR (STENTS) IMPLANT
TUBING CONNECTING 10 (TUBING) ×1 IMPLANT

## 2023-06-18 NOTE — Anesthesia Preprocedure Evaluation (Addendum)
 Anesthesia Evaluation  Patient identified by MRN, date of birth, ID band Patient awake    Reviewed: Allergy & Precautions, NPO status , Patient's Chart, lab work & pertinent test results, reviewed documented beta blocker date and time   Airway Mallampati: III  TM Distance: >3 FB Neck ROM: Full    Dental no notable dental hx. (+) Teeth Intact, Dental Advisory Given   Pulmonary neg pulmonary ROS   Pulmonary exam normal breath sounds clear to auscultation       Cardiovascular hypertension, Pt. on home beta blockers and Pt. on medications Normal cardiovascular exam+ dysrhythmias Atrial Fibrillation + Valvular Problems/Murmurs (mild/mod AS) AS  Rhythm:Regular Rate:Normal  TTE 2024 1. Left ventricular ejection fraction, by estimation, is 60 to 65%. The  left ventricle has normal function. The left ventricle has no regional  wall motion abnormalities. There is mild left ventricular hypertrophy.  Left ventricular diastolic parameters  are consistent with Grade II diastolic dysfunction (pseudonormalization).  Elevated left atrial pressure.   2. Right ventricular systolic function is normal. The right ventricular  size is mildly enlarged. There is moderately elevated pulmonary artery  systolic pressure. The estimated right ventricular systolic pressure is  49.3 mmHg.   3. Left atrial size was moderately dilated.   4. A small pericardial effusion is present.   5. The mitral valve is degenerative. Trivial mitral valve regurgitation.  Severe mitral annular calcification.   6. The aortic valve was not well visualized. There is moderate  calcification of the aortic valve. Aortic valve regurgitation is not  visualized. Mild to moderate aortic valve stenosis. Vmax 2.3 m/s, MG 11  mmHg, AVA 1.3 cm^2, DI 0.5   7. The inferior vena cava is dilated in size with <50% respiratory  variability, suggesting right atrial pressure of 15 mmHg.  Cath  2018  Angiographically normal coronary arteries with a right dominant system.  The left ventricular systolic function is normal.  LV end diastolic pressure is normal.  The left ventricular ejection fraction is 55-65% by visual estimate.  There is no mitral valve regurgitation.  There is no aortic valve stenosis.      Neuro/Psych  PSYCHIATRIC DISORDERS Anxiety     CVA, No Residual Symptoms    GI/Hepatic Neg liver ROS,GERD  ,,  Endo/Other  diabetes, Type 2, Insulin Dependent    Renal/GU negative Renal ROS  negative genitourinary   Musculoskeletal negative musculoskeletal ROS (+)    Abdominal   Peds  Hematology  (+) Blood dyscrasia (eliquis), anemia Lab Results      Component                Value               Date                      WBC                      9.2                 06/18/2023                HGB                      11.3 (L)            06/18/2023                HCT  35.8 (L)            06/18/2023                MCV                      97.5                06/18/2023                PLT                      134 (L)             06/18/2023            Pancytopenia, currently being worked up   Anesthesia Other Findings   Reproductive/Obstetrics                             Anesthesia Physical Anesthesia Plan  ASA: 3 and emergent  Anesthesia Plan: General   Post-op Pain Management: Ofirmev IV (intra-op)*   Induction: Intravenous  PONV Risk Score and Plan: 3 and Ondansetron, Dexamethasone and Treatment may vary due to age or medical condition  Airway Management Planned: LMA  Additional Equipment:   Intra-op Plan:   Post-operative Plan: Extubation in OR  Informed Consent: I have reviewed the patients History and Physical, chart, labs and discussed the procedure including the risks, benefits and alternatives for the proposed anesthesia with the patient or authorized representative who has indicated his/her  understanding and acceptance.     Dental advisory given  Plan Discussed with: CRNA  Anesthesia Plan Comments:        Anesthesia Quick Evaluation

## 2023-06-18 NOTE — Consult Note (Signed)
 Urology Consult   Physician requesting consult: Beckey Downing, MD   Reason for consult: Right ureteral stone  History of Present Illness: [name] is a 78 y.o. female who presented to the [facility] emergency department with a 48-hour history of worsening right-sided flank pain associated with chills.  CT stone study from today revealed a 6 mm right UPJ stone along with a 10 mm right renal stone.  She has a remote history of stones and required ESWL several years ago. Urinalysis is nitrite positive with bacteria.  She is currently normotensive, afebrile and is without leukocytosis.  The patient denies a history of voiding or storage urinary symptoms, hematuria, UTIs, STDs, GU malignancy/trauma/surgery.  Past Medical History:  Diagnosis Date   Actinic keratosis    Followed by Dr. Terri Piedra   Anxiety    Diabetes mellitus, type II, insulin dependent (HCC)    Associated with obesity: She takes Lantus, Victoza & Actos   Diplopia    Essential hypertension    GERD (gastroesophageal reflux disease)    Hyperlipidemia    Morbid obesity with BMI of 40.0-44.9, adult [facility])    Stroke (HCC) 2021    Past Surgical History:  Procedure Laterality Date   BREAST BIOPSY  1997   CARPAL TUNNEL RELEASE Right 11/12/2021   Procedure: CARPAL TUNNEL RELEASE;  Surgeon: Oliver Barre, MD;  Location: [facility];  Service: Orthopedics;  Laterality: Right;  pt knows to arrive at 6:15   LEFT HEART CATH AND CORONARY ANGIOGRAPHY N/A 01/05/2017   Procedure: LEFT HEART CATH AND CORONARY ANGIOGRAPHY;  Surgeon: Marykay Lex, MD;  Location: [facility] INVASIVE CV LAB;  Service: Cardiovascular: Angiographically normal coronary arteries with a right dominant system.  Normal EF 55-65%.   LOOP RECORDER INSERTION N/A 08/01/2019   Procedure: LOOP RECORDER INSERTION;  Surgeon: Regan Lemming, MD;  Location: [facility] INVASIVE CV LAB;  Service: Cardiovascular;  Laterality: N/A;    Current Hospital Medications:  Home Meds:  No  outpatient medications have been marked as taking for the 06/18/23 encounter [facility] Encounter).    Scheduled Meds: Continuous Infusions:  cefTRIAXone (ROCEPHIN)  IV     PRN Meds:.  Allergies:  Allergies  Allergen Reactions   Livalo [Pitavastatin]     MYALGIAS    Sulfa Antibiotics Itching    Itching and red spots.    Family History  Problem Relation Age of Onset   Diabetes Mother    Hyperlipidemia Mother    Stroke Mother 89   Liver cancer Father    Hypertension Sister    Hyperlipidemia Sister    Diabetes Sister        On insulin   Depression Sister    Kidney disease Brother        After dialysis he had renal transplant   Diabetes Brother    Coronary artery disease Brother    Peripheral Artery Disease Brother    Stroke Brother 49   Bladder Cancer Brother    Diabetes Brother     Social History:  reports that she has never smoked. She has never used smokeless tobacco. She reports that she does not drink alcohol and does not use drugs.  ROS: A complete review of systems was performed.  All systems are negative except for pertinent findings as noted.  Physical Exam:  Vital signs in last 24 hours: Temp:  [97.1 F (36.2 C)-98.2 F (36.8 C)] 98.2 F (36.8 C) (03/22 1000) Pulse Rate:  [61-78] 72 (03/22 1159) Resp:  [14-20] 20 (03/22  1000) BP: (169-207)/(67-84) 169/68 (03/22 1145) SpO2:  [80 %-97 %] 96 % (03/22 1159) Weight:  [161 kg] 108 kg (03/22 1007) Constitutional:  Alert and oriented, No acute distress Cardiovascular: Regular rate and rhythm, No JVD Respiratory: Normal respiratory effort, Lungs clear bilaterally GI: Abdomen is soft, nontender, nondistended, no abdominal masses GU: No CVA tenderness Lymphatic: No lymphadenopathy Neurologic: Grossly intact, no focal deficits Psychiatric: Normal mood and affect  Laboratory Data:  Recent Labs    06/18/23 1017  WBC 9.2  HGB 11.3*  HCT 35.8*  PLT 134*    Recent Labs    06/18/23 1017  NA 136  K 4.3   CL 99  GLUCOSE 237*  BUN 27*  CALCIUM 9.4  CREATININE 0.75     Results for orders placed or performed during the hospital encounter of 06/18/23 (from the past 24 hours)  Urinalysis, Routine w reflex microscopic -Urine, Clean Catch     Status: Abnormal   Collection Time: 06/18/23 10:10 AM  Result Value Ref Range   Color, Urine YELLOW YELLOW   APPearance CLOUDY (A) CLEAR   Specific Gravity, Urine >1.046 (H) 1.005 - 1.030   pH 5.0 5.0 - 8.0   Glucose, UA 50 (A) NEGATIVE mg/dL   Hgb urine dipstick LARGE (A) NEGATIVE   Bilirubin Urine NEGATIVE NEGATIVE   Ketones, ur NEGATIVE NEGATIVE mg/dL   Protein, ur 096 (A) NEGATIVE mg/dL   Nitrite POSITIVE (A) NEGATIVE   Leukocytes,Ua MODERATE (A) NEGATIVE   RBC / HPF >50 0 - 5 RBC/hpf   WBC, UA >50 0 - 5 WBC/hpf   Bacteria, UA RARE (A) NONE SEEN   Squamous Epithelial / HPF 11-20 0 - 5 /HPF   WBC Clumps PRESENT   Lipase, blood     Status: None   Collection Time: 06/18/23 10:17 AM  Result Value Ref Range   Lipase 27 11 - 51 U/L  Comprehensive metabolic panel     Status: Abnormal   Collection Time: 06/18/23 10:17 AM  Result Value Ref Range   Sodium 136 135 - 145 mmol/L   Potassium 4.3 3.5 - 5.1 mmol/L   Chloride 99 98 - 111 mmol/L   CO2 25 22 - 32 mmol/L   Glucose, Bld 237 (H) 70 - 99 mg/dL   BUN 27 (H) 8 - 23 mg/dL   Creatinine, Ser 0.45 0.44 - 1.00 mg/dL   Calcium 9.4 8.9 - 40.9 mg/dL   Total Protein 7.6 6.5 - 8.1 g/dL   Albumin 4.0 3.5 - 5.0 g/dL   AST 30 15 - 41 U/L   ALT 38 0 - 44 U/L   Alkaline Phosphatase 102 38 - 126 U/L   Total Bilirubin 1.4 (H) 0.0 - 1.2 mg/dL   GFR, Estimated >81 >19 mL/min   Anion gap 12 5 - 15  CBC with Differential     Status: Abnormal   Collection Time: 06/18/23 10:17 AM  Result Value Ref Range   WBC 9.2 4.0 - 10.5 K/uL   RBC 3.67 (L) 3.87 - 5.11 MIL/uL   Hemoglobin 11.3 (L) 12.0 - 15.0 g/dL   HCT 14.7 (L) 82.9 - 56.2 %   MCV 97.5 80.0 - 100.0 fL   MCH 30.8 26.0 - 34.0 pg   MCHC 31.6 30.0 -  36.0 g/dL   RDW 13.0 86.5 - 78.4 %   Platelets 134 (L) 150 - 400 K/uL   nRBC 0.0 0.0 - 0.2 %   Neutrophils Relative % 93 %   Neutro  Abs 8.4 (H) 1.7 - 7.7 K/uL   Lymphocytes Relative 4 %   Lymphs Abs 0.4 (L) 0.7 - 4.0 K/uL   Monocytes Relative 3 %   Monocytes Absolute 0.3 0.1 - 1.0 K/uL   Eosinophils Relative 0 %   Eosinophils Absolute 0.0 0.0 - 0.5 K/uL   Basophils Relative 0 %   Basophils Absolute 0.0 0.0 - 0.1 K/uL   Immature Granulocytes 0 %   Abs Immature Granulocytes 0.04 0.00 - 0.07 K/uL   No results found for this or any previous visit (from the past 240 hours).  Renal Function: Recent Labs    06/18/23 1017  CREATININE 0.75   Estimated Creatinine Clearance: 73.4 mL/min (by C-G formula based on SCr of 0.75 mg/dL).  Radiologic Imaging: CT ABDOMEN PELVIS W CONTRAST Result Date: 06/18/2023 CLINICAL DATA:  Acute right-sided abdominal and flank pain. Nausea and vomiting. EXAM: CT ABDOMEN AND PELVIS WITH CONTRAST TECHNIQUE: Multidetector CT imaging of the abdomen and pelvis was performed using the standard protocol following bolus administration of intravenous contrast. RADIATION DOSE REDUCTION: This exam was performed according to the departmental dose-optimization program which includes automated exposure control, adjustment of the mA and/or kV according to patient size and/or use of iterative reconstruction technique. CONTRAST:  OMNIPAQUE IOHEXOL 300 MG/ML  SOLN COMPARISON:  None Available. FINDINGS: Lower Chest: Moderate cardiomegaly and tiny pericardial effusion. Tiny bilateral pleural effusions also noted. Hepatobiliary: No suspicious hepatic masses identified. Tiny gallstones noted, without signs of cholecystitis or biliary ductal dilatation. Pancreas:  No mass or inflammatory changes. Spleen: Within normal limits in size and appearance. Adrenals/Urinary Tract: No suspicious masses identified. Mild to moderate right hydronephrosis and perinephric stranding is seen due to a 6  mm calculus at the right UPJ. A 10 mm nonobstructing calculus is also seen in the right renal pelvis. Stomach/Bowel: Small hiatal hernia noted. No evidence of obstruction, inflammatory process or abnormal fluid collections. Vascular/Lymphatic: No pathologically enlarged lymph nodes. No acute vascular findings. Reproductive:  No mass or other significant abnormality. Other:  None. Musculoskeletal:  No suspicious bone lesions identified. IMPRESSION: Mild to moderate right hydronephrosis and perinephric stranding due to 6 mm calculus at the right UPJ. 10 mm nonobstructing calculus in right renal pelvis. Cholelithiasis. No radiographic evidence of cholecystitis. Small hiatal hernia. Electronically Signed   By: Danae Orleans M.D.   On: 06/18/2023 12:08    I independently reviewed the above imaging studies.  Impression/Recommendation 79 year old female with an obstructing 6 mm right UPJ stone, 10 mm right renal pelvis stone and concomitant UTI  -The risks, benefits and alternatives of cystoscopy with right JJ stent placement was discussed with the patient.  Risks include, but are not limited to: bleeding, urinary tract infection, ureteral injury, ureteral stricture disease, chronic pain, urinary symptoms, bladder injury, stent migration, the need for nephrostomy tube placement, MI, CVA, DVT, PE and the inherent risks with general anesthesia.  The patient voices understanding and wishes to proceed.    Rhoderick Moody, MD [facility] 06/18/2023, 1:00 PM

## 2023-06-18 NOTE — Anesthesia Procedure Notes (Signed)
 Procedure Name: LMA Insertion Date/Time: 06/18/2023 3:31 PM  Performed by: Oletha Cruel, CRNAPre-anesthesia Checklist: Patient identified, Emergency Drugs available, Suction available and Patient being monitored Patient Re-evaluated:Patient Re-evaluated prior to induction Oxygen Delivery Method: Circle system utilized Preoxygenation: Pre-oxygenation with 100% oxygen Induction Type: IV induction LMA: LMA inserted LMA Size: 4.0 Number of attempts: 1 Placement Confirmation: positive ETCO2, CO2 detector and breath sounds checked- equal and bilateral Tube secured with: Tape Dental Injury: Teeth and Oropharynx as per pre-operative assessment  Comments: Atraumatic insertion of LMA size 4. Lips and teeth remain intact.

## 2023-06-18 NOTE — ED Triage Notes (Addendum)
 BIB family from home by way of UC. Sent from UC to ED. C/o R flank pain, R sided abd pain, nausea and dry heaves. Sx onset 0100. Denies fever, sob, cough, diarrhea, or urinary sx. Last BM 4am, normal. UC gave ODT zofran. H/o DM. Alert, restless in pain. Blood in urine at UC. H/o kidney stone.

## 2023-06-18 NOTE — Op Note (Signed)
 Operative Note  Preoperative diagnosis:  1.  6 mm right UPJ and 1 cm right renal pelvis stones  Postoperative diagnosis: Name  Procedure(s): 1.  Cystoscopy with right ureteral stent placement 2.  Right retrograde pyelogram with intraoperative interpretation fluoroscopic imaging  Surgeon: Rhoderick Moody, MD  Assistants:  None  Anesthesia:  General  Complications:  None  EBL: Less than 5 mL  Specimens: 1.  None  Drains/Catheters: 1.  Right 6 French, 24 cm JJ stent without tether  Intraoperative findings:   No intravesical or urethral abnormalities were seen Right retrograde pyelogram revealed a filling defect within the proximal aspects of the right ureter along with a large filling defect within the right renal pelvis, consistent with the obstructing ureteral and right renal stone seen on cross-sectional imaging.  No other filling defects were seen involving the right upper urinary tract.  Indication:  [name] is a 78 y.o. female with an obstructing 6 mm right UPJ stone along with a 1 cm right renal pelvis stone and concomitant UTI.  She has been consented for the above procedures, voices understanding and wishes to proceed.  Description of procedure:  After informed consent was obtained, the patient was brought to the operating room and general LMA anesthesia was administered. The patient was then placed in the dorsolithotomy position and prepped and draped in the usual sterile fashion. A timeout was performed. A 23 French rigid cystoscope was then inserted into the urethral meatus and advanced into the bladder under direct vision. A complete bladder survey revealed no intravesical pathology.  A 5 French ureteral catheter was then inserted into the right ureteral orifice and a retrograde pyelogram was obtained, with the findings listed above.  A Glidewire was then used to intubate the lumen of the ureteral catheter and was advanced up to the right renal pelvis,  under fluoroscopic guidance.  The catheter was then removed, leaving the wire in place.  A 6 French, 24 cm JJ stent was then advanced over the Glidewire and into good position within the right collecting system, confirming placement via fluoroscopy.  The patient's bladder was drained.  She tolerated the procedure well and was transferred to the postanesthesia in stable condition.  Plan: Monitor on the floor overnight.  Okay to discharge tomorrow morning if she remains afebrile.  Will plan for outpatient ureteroscopy for definitive stone treatment

## 2023-06-18 NOTE — ED Provider Notes (Signed)
 [place] EMERGENCY DEPARTMENT AT [facility] Provider Note   CSN: [id number] Arrival date & time: 06/18/23  4401     History  Chief Complaint  Patient presents with   Flank Pain    Joellen Tullos is a 78 y.o. female.  78 year old female with past medical history of atrial fibrillation on Eliquis as well as diabetes and hypertension presenting to the emergency department today with right-sided flank pain.  The patient states she woke up with this about 2 AM.  The patient has had nausea and vomiting with this.  She reports this mostly dry heaves.  She reports normal bowel movement yesterday.  She apparently went to urgent care earlier and did note some microscopic hematuria there.  She was sent to the ER for further evaluation regarding this.  She does report history of kidney stones in the past.   Flank Pain       Home Medications Prior to Admission medications   Medication Sig Start Date End Date Taking? Authorizing Provider  acetaminophen (TYLENOL) 650 MG CR tablet Take 650 mg by mouth every 8 (eight) hours as needed for pain.    [provider]  alendronate (FOSAMAX) 70 MG tablet Take 70 mg by mouth once a week. Take with a full glass of water on an empty stomach.    [provider]  ALPRAZolam Prudy Feeler) 0.25 MG tablet Take 0.25 mg by mouth 3 (three) times daily as needed. 11/09/21   [provider]  apixaban (ELIQUIS) 5 MG TABS tablet TAKE ONE TABLET BY MOUTH EVERY MORNING and TAKE ONE TABLET BY MOUTH EVERY EVENING 01/31/23   Fenton, Clint R, PA  atorvastatin (LIPITOR) 40 MG tablet Take 1 tablet (40 mg total) by mouth daily. 08/03/19 05/24/23  Dorcas Carrow, MD  blood glucose meter kit and supplies KIT Dispense based on patient and insurance preference. Use up to four times daily as directed. (FOR ICD-9 250.00, 250.01). 08/02/19   Dorcas Carrow, MD  carvedilol (COREG) 12.5 MG tablet Take 1 tablet (12.5 mg total) by mouth 2 (two) times daily.  11/16/19   Camnitz, Will Daphine Deutscher, MD  diphenhydramine-acetaminophen (TYLENOL PM) 25-500 MG TABS tablet Take 2 tablets by mouth at bedtime.    [provider]  flecainide (TAMBOCOR) 50 MG tablet Take 1 tablet by mouth twice daily 04/06/23   Fenton, Clint R, PA  furosemide (LASIX) 40 MG tablet Take 1 tablet (40 mg total) by mouth daily. 12/07/22 05/24/23  Carlos Levering, NP  Insulin Syringe-Needle U-100 (RELION INSULIN SYR .3CC/29G) 29G X 1/2" 0.3 ML MISC 1 each by Does not apply route in the morning and at bedtime. 08/02/19   Dorcas Carrow, MD  Lancets (ONETOUCH DELICA PLUS LANCET33G) MISC USE 1 LANCET TO CHECK GLUCOSE UP TO 4 TIMES DAILY 08/02/19   [provider]  LANTUS SOLOSTAR 100 UNIT/ML Solostar Pen Inject 30 Units into the skin in the morning. 11/12/19   [provider]  lisinopril-hydrochlorothiazide (ZESTORETIC) 20-25 MG tablet Take 1 tablet by mouth daily. 09/17/19   [provider]  nitroGLYCERIN (NITROSTAT) 0.4 MG SL tablet Place 1 tablet (0.4 mg total) under the tongue every 5 (five) minutes as needed for chest pain. 12/31/16 05/24/23  Marykay Lex, MD  [facility] VERIO test strip USE 1 STRIP TO CHECK GLUCOSE UP TO 4 TIMES DAILY AS DIRECTED 08/02/19   [provider]  pioglitazone (ACTOS) 45 MG tablet Take 45 mg by mouth daily.    [provider]  Tana Conch  PEN NEEDLES 31G X 6 MM MISC USE 1 IN THE MORNING AND AT BEDTIME 08/02/19   [provider]      Allergies    Livalo [pitavastatin] and Sulfa antibiotics    Review of Systems   Review of Systems  Genitourinary:  Positive for flank pain.  All other systems reviewed and are negative.   Physical Exam Updated Vital Signs BP (!) 148/53 (BP Location: Right Arm)   Pulse 67   Temp 99.9 F (37.7 C)   Resp 15   Wt 108 kg   SpO2 100%   BMI 37.28 kg/m  Physical Exam Vitals and nursing note reviewed.   Gen: Appears uncomfortable Eyes: PERRL, EOMI HEENT: no oropharyngeal  swelling Neck: trachea midline Resp: clear to auscultation bilaterally Card: RRR, no murmurs, rubs, or gallops Abd: Right-sided CVA tenderness noted, no overlying rash Extremities: no calf tenderness, no edema Vascular: 2+ radial pulses bilaterally, 2+ DP pulses bilaterally Neuro: No focal deficits Skin: no rashes Psyc: acting appropriately   ED Results / Procedures / Treatments   Labs (all labs ordered are listed, but only abnormal results are displayed) Labs Reviewed  COMPREHENSIVE METABOLIC PANEL - Abnormal; Notable for the following components:      Result Value   Glucose, Bld 237 (*)    BUN 27 (*)    Total Bilirubin 1.4 (*)    All other components within normal limits  URINALYSIS, ROUTINE W REFLEX MICROSCOPIC - Abnormal; Notable for the following components:   APPearance CLOUDY (*)    Specific Gravity, Urine >1.046 (*)    Glucose, UA 50 (*)    Hgb urine dipstick LARGE (*)    Protein, ur 100 (*)    Nitrite POSITIVE (*)    Leukocytes,Ua MODERATE (*)    Bacteria, UA RARE (*)    All other components within normal limits  CBC WITH DIFFERENTIAL/PLATELET - Abnormal; Notable for the following components:   RBC 3.67 (*)    Hemoglobin 11.3 (*)    HCT 35.8 (*)    Platelets 134 (*)    Neutro Abs 8.4 (*)    Lymphs Abs 0.4 (*)    All other components within normal limits  GLUCOSE, CAPILLARY - Abnormal; Notable for the following components:   Glucose-Capillary 131 (*)    All other components within normal limits  LIPASE, BLOOD    EKG None  Radiology DG C-Arm 1-60 Min-No Report Result Date: 06/18/2023 Fluoroscopy was utilized by the requesting physician.  No radiographic interpretation.   CT ABDOMEN PELVIS W CONTRAST Result Date: 06/18/2023 CLINICAL DATA:  Acute right-sided abdominal and flank pain. Nausea and vomiting. EXAM: CT ABDOMEN AND PELVIS WITH CONTRAST TECHNIQUE: Multidetector CT imaging of the abdomen and pelvis was performed using the standard protocol following  bolus administration of intravenous contrast. RADIATION DOSE REDUCTION: This exam was performed according to the departmental dose-optimization program which includes automated exposure control, adjustment of the mA and/or kV according to patient size and/or use of iterative reconstruction technique. CONTRAST:  OMNIPAQUE IOHEXOL 300 MG/ML  SOLN COMPARISON:  None Available. FINDINGS: Lower Chest: Moderate cardiomegaly and tiny pericardial effusion. Tiny bilateral pleural effusions also noted. Hepatobiliary: No suspicious hepatic masses identified. Tiny gallstones noted, without signs of cholecystitis or biliary ductal dilatation. Pancreas:  No mass or inflammatory changes. Spleen: Within normal limits in size and appearance. Adrenals/Urinary Tract: No suspicious masses identified. Mild to moderate right hydronephrosis and perinephric stranding is seen due to a 6 mm calculus at the right UPJ.  A 10 mm nonobstructing calculus is also seen in the right renal pelvis. Stomach/Bowel: Small hiatal hernia noted. No evidence of obstruction, inflammatory process or abnormal fluid collections. Vascular/Lymphatic: No pathologically enlarged lymph nodes. No acute vascular findings. Reproductive:  No mass or other significant abnormality. Other:  None. Musculoskeletal:  No suspicious bone lesions identified. IMPRESSION: Mild to moderate right hydronephrosis and perinephric stranding due to 6 mm calculus at the right UPJ. 10 mm nonobstructing calculus in right renal pelvis. Cholelithiasis. No radiographic evidence of cholecystitis. Small hiatal hernia. Electronically Signed   By: Danae Orleans M.D.   On: 06/18/2023 12:08    Procedures Procedures    Medications Ordered in ED Medications  oxyCODONE (Oxy IR/ROXICODONE) immediate release tablet 5 mg (has no administration in time range)    Or  oxyCODONE (ROXICODONE) 5 MG/5ML solution 5 mg (has no administration in time range)  fentaNYL (SUBLIMAZE) injection 25-50 mcg  (has no administration in time range)  amisulpride (BARHEMSYS) injection 10 mg (has no administration in time range)  water for irrigation, sterile for irrigation SOLN (3,000 mLs  Given 06/18/23 1547)  iohexol (OMNIPAQUE) 300 MG/ML solution (6 mLs  Given 06/18/23 1548)  morphine (PF) 4 MG/ML injection 4 mg (4 mg Intravenous Given 06/18/23 1054)  ondansetron (ZOFRAN) injection 4 mg (4 mg Intravenous Given 06/18/23 1055)  iohexol (OMNIPAQUE) 300 MG/ML solution 100 mL (100 mLs Intravenous Contrast Given 06/18/23 1105)  cefTRIAXone (ROCEPHIN) 2 g in sodium chloride 0.9 % 100 mL IVPB (0 g Intravenous Stopped 06/18/23 1350)  acetaminophen (OFIRMEV) 10 MG/ML IV (  Override pull for Anesthesia 06/18/23 1548)    ED Course/ Medical Decision Making/ A&P                                 Medical Decision Making 78 year old female with past medical history of diabetes, hypertension, and ureterolithiasis in the past presenting to the emergency department today with right-sided flank pain.  I will further evaluate patient here with basic labs including LFTs and a lipase to evaluate for hepatobiliary otology or pancreatitis.  Will obtain a CT scan of her abdomen to evaluate for ureterolithiasis, appendicitis, diverticulitis, colitis, or other intra-abdominal pathology.  I will give patient morphine Zofran for symptoms and reevaluate for ultimate disposition.  The patient's labs were reassuring.  CT scan does show a ureteral stone as well as a nephric stone.  Her urinalysis does show findings concerning for infection.  The patient is given Rocephin.  I did call and discuss her case with Dr. Liliane Shi from urology.  He recommends having the patient admitted to the hospitalist service and for them to transfer the patient to [facility] preop area and he will have her added on this afternoon.  A call was placed to the hospitalist service for admission.    Amount and/or Complexity of Data Reviewed Labs: ordered. Radiology:  ordered.  Risk Prescription drug management. Decision regarding hospitalization.           Final Clinical Impression(s) / ED Diagnoses Final diagnoses:  Urinary tract infection with hematuria, site unspecified  Ureteral stone    Rx / DC Orders ED Discharge Orders     None         Durwin Glaze, MD 06/18/23 1614

## 2023-06-18 NOTE — ED Notes (Signed)
 Patient appears unwell.  Patient answers questions, but otherwise sits in chair with eyes closed.  Respirations regular, unlabored.  Skin cool to cold to touch.  Patient reports she has been drinking ginger ale this morning.

## 2023-06-18 NOTE — H&P (Signed)
 History and Physical    Patient: [name] ZOX:[id number] DOB: Jul 04, 1945 DOA: 06/18/2023 DOS: the patient was seen and examined on 06/18/2023 PCP: Joycelyn Rua, MD  Patient coming from:  [facility]  Chief Complaint:  Chief Complaint  Patient presents with   Flank Pain   HPI: Reatha Sur is a 78 y.o. female with medical history significant of hypertension, hyperlipidemia, type 2 diabetes, paroxysmal atrial fibrillation on Eliquis, chronic bilateral lower extremity edema, history of CVA, chronic normocytic anemia, chronic thrombocytopenia who presented to the ED for left flank and back pain.  Was in her usual state of health until around 1-2AM on 3/22 when she awakened to use the bathroom. While trying to urinate, she began having abrupt onset left back pain and left flank pain that was persistent. States that her pain remained in this area. She also endorse associated nausea and dry heaving, did not actually vomit. Given lack of pain improvement, she came in to [facility] for further evaluation. At [facility] ED, she was found to have a UTI and CT imaging revealed mild to moderate right hydronephrosis and perinephric stranding due to a 6mm calculus at the right UPJ. She was transferred to [facility] and underwent JJ stent placement with urology, Dr. Liliane Shi. While in the PACU, [facility] hospitalist asked to evaluate patient for admission.   ED course at [facility]: vitals stable. CBC without leukocytosis, mild normocytic anemia improved from baseline, chronic thrombocytopenia around baseline. CMP with hyperglycemia to 237, otherwise unremarkable. Lipase normal. Urinalysis with hematuria, positive nitrite, moderate leukocytes, >50 WBC, rare bacteria. CT abd/pel with findings as noted above. CBG at [facility] with glucose 131.   Review of Systems: As mentioned in the history of present illness. All other systems reviewed and are negative. Past Medical History:  Diagnosis Date   Actinic keratosis    Followed  by Dr. Terri Piedra   Anxiety    Diabetes mellitus, type II, insulin dependent (HCC)    Associated with obesity: She takes Lantus, Victoza & Actos   Diplopia    Essential hypertension    GERD (gastroesophageal reflux disease)    Hyperlipidemia    Morbid obesity with BMI of 40.0-44.9, adult [facility])    Stroke (HCC) 2021   Past Surgical History:  Procedure Laterality Date   BREAST BIOPSY  1997   CARPAL TUNNEL RELEASE Right 11/12/2021   Procedure: CARPAL TUNNEL RELEASE;  Surgeon: Oliver Barre, MD;  Location: [facility] ORS;  Service: Orthopedics;  Laterality: Right;  pt knows to arrive at 6:15   LEFT HEART CATH AND CORONARY ANGIOGRAPHY N/A 01/05/2017   Procedure: LEFT HEART CATH AND CORONARY ANGIOGRAPHY;  Surgeon: Marykay Lex, MD;  Location: [facility] INVASIVE CV LAB;  Service: Cardiovascular: Angiographically normal coronary arteries with a right dominant system.  Normal EF 55-65%.   LOOP RECORDER INSERTION N/A 08/01/2019   Procedure: LOOP RECORDER INSERTION;  Surgeon: Regan Lemming, MD;  Location: [facility] INVASIVE CV LAB;  Service: Cardiovascular;  Laterality: N/A;   Social History:  reports that she has never smoked. She has never used smokeless tobacco. She reports that she does not drink alcohol and does not use drugs.  Allergies  Allergen Reactions   Livalo [Pitavastatin]     MYALGIAS    Sulfa Antibiotics Itching    Itching and red spots.    Family History  Problem Relation Age of Onset   Diabetes Mother    Hyperlipidemia Mother    Stroke Mother 98   Liver cancer Father  Hypertension Sister    Hyperlipidemia Sister    Diabetes Sister        On insulin   Depression Sister    Kidney disease Brother        After dialysis he had renal transplant   Diabetes Brother    Coronary artery disease Brother    Peripheral Artery Disease Brother    Stroke Brother 77   Bladder Cancer Brother    Diabetes Brother     Prior to Admission medications   Medication Sig Start Date End Date Taking?  Authorizing Provider  acetaminophen (TYLENOL) 650 MG CR tablet Take 650 mg by mouth every 8 (eight) hours as needed for pain.    [provider]  alendronate (FOSAMAX) 70 MG tablet Take 70 mg by mouth once a week. Take with a full glass of water on an empty stomach.    [provider]  ALPRAZolam Prudy Feeler) 0.25 MG tablet Take 0.25 mg by mouth 3 (three) times daily as needed. 11/09/21   [provider]  apixaban (ELIQUIS) 5 MG TABS tablet TAKE ONE TABLET BY MOUTH EVERY MORNING and TAKE ONE TABLET BY MOUTH EVERY EVENING 01/31/23   Fenton, Clint R, PA  atorvastatin (LIPITOR) 40 MG tablet Take 1 tablet (40 mg total) by mouth daily. 08/03/19 05/24/23  Dorcas Carrow, MD  blood glucose meter kit and supplies KIT Dispense based on patient and insurance preference. Use up to four times daily as directed. (FOR ICD-9 250.00, 250.01). 08/02/19   Dorcas Carrow, MD  carvedilol (COREG) 12.5 MG tablet Take 1 tablet (12.5 mg total) by mouth 2 (two) times daily. 11/16/19   Camnitz, Will Daphine Deutscher, MD  diphenhydramine-acetaminophen (TYLENOL PM) 25-500 MG TABS tablet Take 2 tablets by mouth at bedtime.    [provider]  flecainide (TAMBOCOR) 50 MG tablet Take 1 tablet by mouth twice daily 04/06/23   Fenton, Clint R, PA  furosemide (LASIX) 40 MG tablet Take 1 tablet (40 mg total) by mouth daily. 12/07/22 05/24/23  Carlos Levering, NP  Insulin Syringe-Needle U-100 (RELION INSULIN SYR .3CC/29G) 29G X 1/2" 0.3 ML MISC 1 each by Does not apply route in the morning and at bedtime. 08/02/19   Dorcas Carrow, MD  Lancets (ONETOUCH DELICA PLUS LANCET33G) MISC USE 1 LANCET TO CHECK GLUCOSE UP TO 4 TIMES DAILY 08/02/19   [provider]  LANTUS SOLOSTAR 100 UNIT/ML Solostar Pen Inject 30 Units into the skin in the morning. 11/12/19   [provider]  lisinopril-hydrochlorothiazide (ZESTORETIC) 20-25 MG tablet Take 1 tablet by mouth daily. 09/17/19   [provider]  nitroGLYCERIN  (NITROSTAT) 0.4 MG SL tablet Place 1 tablet (0.4 mg total) under the tongue every 5 (five) minutes as needed for chest pain. 12/31/16 05/24/23  Marykay Lex, MD  [facility] VERIO test strip USE 1 STRIP TO CHECK GLUCOSE UP TO 4 TIMES DAILY AS DIRECTED 08/02/19   [provider]  pioglitazone (ACTOS) 45 MG tablet Take 45 mg by mouth daily.    [provider]  RELION PEN NEEDLES 31G X 6 MM MISC USE 1 IN THE MORNING AND AT BEDTIME 08/02/19   [provider]    Physical Exam: Vitals:   06/18/23 1615 06/18/23 1630 06/18/23 1646 06/18/23 1700  BP: 128/61 (!) 146/39 (!) 151/36 (!) 146/40  Pulse: 66 62 60 60  Resp: 16 18 15 13   Temp:  99.2 F (37.3 C)    TempSrc:      SpO2: 100% 98% 100%  100%  Weight:       Physical Exam Constitutional:      Appearance: Normal appearance. She is obese. She is not ill-appearing.  HENT:     Head: Normocephalic and atraumatic.     Mouth/Throat:     Mouth: Mucous membranes are moist.     Pharynx: Oropharynx is clear. No oropharyngeal exudate.  Eyes:     General: No scleral icterus.    Extraocular Movements: Extraocular movements intact.     Conjunctiva/sclera: Conjunctivae normal.     Pupils: Pupils are equal, round, and reactive to light.  Cardiovascular:     Rate and Rhythm: Normal rate and regular rhythm.     Heart sounds: Normal heart sounds. No murmur heard.    No friction rub. No gallop.  Pulmonary:     Effort: Pulmonary effort is normal. No respiratory distress.     Breath sounds: Normal breath sounds. No wheezing, rhonchi or rales.     Comments: On 3L [place] for comfort. Abdominal:     General: Bowel sounds are normal. There is no distension.     Palpations: Abdomen is soft.     Tenderness: There is no right CVA tenderness, left CVA tenderness, guarding or rebound.     Comments: Mild RLQ tenderness around drain site. Right JJ drain in place.  Musculoskeletal:        General: Normal range of motion.     Cervical back: Normal  range of motion.     Comments: Trace peripheral edema in bilateral LE  Skin:    General: Skin is warm and dry.  Neurological:     General: No focal deficit present.     Mental Status: She is alert and oriented to person, place, and time.  Psychiatric:        Mood and Affect: Mood normal.        Behavior: Behavior normal.     Data Reviewed:  There are no new results to review at this time.    Latest Ref Rng & Units 06/18/2023   10:17 AM 06/13/2023    8:15 AM 05/23/2023    8:19 AM  CBC  WBC 4.0 - 10.5 K/uL 9.2  4.9  3.8   Hemoglobin 12.0 - 15.0 g/dL 40.9  9.8  9.2   Hematocrit 36.0 - 46.0 % 35.8  32.8  30.9   Platelets 150 - 400 K/uL 134  121  105       Latest Ref Rng & Units 06/18/2023   10:17 AM 05/23/2023    8:19 AM 12/21/2022    9:13 AM  BMP  Glucose 70 - 99 mg/dL 811  98  914   BUN 8 - 23 mg/dL 27  20  29    Creatinine 0.44 - 1.00 mg/dL 7.82  9.56  2.13   BUN/Creat Ratio 12 - 28   28   Sodium 135 - 145 mmol/L 136  138  141   Potassium 3.5 - 5.1 mmol/L 4.3  4.0  4.6   Chloride 98 - 111 mmol/L 99  104  101   CO2 22 - 32 mmol/L 25  25  29    Calcium 8.9 - 10.3 mg/dL 9.4  8.9  9.1    Lipase     Component Value Date/Time   LIPASE 27 06/18/2023 1017   Urinalysis    Component Value Date/Time   COLORURINE YELLOW 06/18/2023 1010   APPEARANCEUR CLOUDY (A) 06/18/2023 1010   LABSPEC >1.046 (H) 06/18/2023 1010   PHURINE  5.0 06/18/2023 1010   GLUCOSEU 50 (A) 06/18/2023 1010   HGBUR LARGE (A) 06/18/2023 1010   BILIRUBINUR NEGATIVE 06/18/2023 1010   BILIRUBINUR negative 06/18/2023 0901   KETONESUR NEGATIVE 06/18/2023 1010   KETONESUR trace (5) (A) 06/18/2023 0901   PROTEINUR 100 (A) 06/18/2023 1010   PROTEINUR >=300 (A) 06/18/2023 0901   UROBILINOGEN 1.0 06/18/2023 0901   NITRITE POSITIVE (A) 06/18/2023 1010   NITRITE Negative 06/18/2023 0901   LEUKOCYTESUR MODERATE (A) 06/18/2023 1010   LEUKOCYTESUR Small (1+) (A) 06/18/2023 0901    DG C-Arm 1-60 Min-No  Report Result Date: 06/18/2023 Fluoroscopy was utilized by the requesting physician.  No radiographic interpretation.   CT ABDOMEN PELVIS W CONTRAST Result Date: 06/18/2023 CLINICAL DATA:  Acute right-sided abdominal and flank pain. Nausea and vomiting. EXAM: CT ABDOMEN AND PELVIS WITH CONTRAST TECHNIQUE: Multidetector CT imaging of the abdomen and pelvis was performed using the standard protocol following bolus administration of intravenous contrast. RADIATION DOSE REDUCTION: This exam was performed according to the departmental dose-optimization program which includes automated exposure control, adjustment of the mA and/or kV according to patient size and/or use of iterative reconstruction technique. CONTRAST:  OMNIPAQUE IOHEXOL 300 MG/ML  SOLN COMPARISON:  None Available. FINDINGS: Lower Chest: Moderate cardiomegaly and tiny pericardial effusion. Tiny bilateral pleural effusions also noted. Hepatobiliary: No suspicious hepatic masses identified. Tiny gallstones noted, without signs of cholecystitis or biliary ductal dilatation. Pancreas:  No mass or inflammatory changes. Spleen: Within normal limits in size and appearance. Adrenals/Urinary Tract: No suspicious masses identified. Mild to moderate right hydronephrosis and perinephric stranding is seen due to a 6 mm calculus at the right UPJ. A 10 mm nonobstructing calculus is also seen in the right renal pelvis. Stomach/Bowel: Small hiatal hernia noted. No evidence of obstruction, inflammatory process or abnormal fluid collections. Vascular/Lymphatic: No pathologically enlarged lymph nodes. No acute vascular findings. Reproductive:  No mass or other significant abnormality. Other:  None. Musculoskeletal:  No suspicious bone lesions identified. IMPRESSION: Mild to moderate right hydronephrosis and perinephric stranding due to 6 mm calculus at the right UPJ. 10 mm nonobstructing calculus in right renal pelvis. Cholelithiasis. No radiographic evidence of  cholecystitis. Small hiatal hernia. Electronically Signed   By: Danae Orleans M.D.   On: 06/18/2023 12:08     Assessment and Plan: No notes have been filed under this hospital service. Service: Hospitalist  Obstructing 6 mm right UPJ stone (status post stent placement) with concomitant UTI Mild to moderate right hydronephrosis and perinephric stranding due to above 10 mm nonobstructing calculus in right renal pelvis Patient presenting with acute onset left back and left flank pain, found to have UTI and an obstructing 6 mm right UPJ stone on imaging.  Patient transferred to [facility] for further care.  Urology took patient to the OR and were able to place a right JJ stent.  Per urology, plan to monitor overnight and okay to discharge tomorrow morning if patient remains afebrile. Has remained afebrile thus far. Okay to continue Rocephin for empiric UTI coverage.  No urine culture obtained at [facility], ordered on admission here.  Per urology, will plan for outpatient ureteroscopy for definitive stone treatment. She is on 3L [place] in the PACU for comfort but saturating well when taken off oxygen at bedside. Will continue oxygen for comfort for now. -Urology following, appreciate management -Underwent stent placement today -IV Rocephin for empiric UTI coverage -Follow-up urine culture -Pain control with Tylenol every 6 hours as needed, oxycodone 5 mg  every 4 hours as needed, Dilaudid 0.5 mg every 3 hours as needed -zofran q6h prn for nausea -trend fever curve -Heart healthy/carb modified diet -Resuming home Eliquis -Place in observation -urology will plan for outpatient ureteroscopy for definitive stone treatment  Type 2 diabetes with hyperglycemia Patient with history of type 2 diabetes, takes Lantus 30 units daily and pioglitazone 45 mg daily at home.  Last A1c on file from several years ago, will order on admission.  Random glucose was 237 on CMP at [facility] but this has improved on CBG  here at [facility] to 131. -Follow-up hemoglobin A1c -Resume Lantus 30 units daily from tomorrow morning -Starting moderate SSI today -Trend CBGs, goal 140-180  Paroxysmal atrial fibrillation -Patient follows cardiology in the outpatient setting -Takes Eliquis 5 mg twice daily, flecainide 50 mg twice daily, Coreg 12.5 mg twice daily at home -Resume home medications (per urology, okay to resume Eliquis tonight) -Currently in sinus rhythm and rate controlled  Hypertension Patient taking Coreg 12.5 mg twice daily and Zestoretic 20-25 mg daily at home.  Blood pressures ranging in the 140s-170s/40s-60s.  Kidney function stable.  Will resume home medications.  Likely some exacerbation from pain secondary to nephrolithiasis. -Resume home Coreg and Zestoretic -Trend blood pressure  Chronic normocytic anemia Chronic thrombocytopenia Lab work today showing normocytic anemia and chronic thrombocytopenia with values slightly improved from priors.  Hemoglobin 11.3, no reported bleeding events per patient.  Per chart review, noted history of iron deficiency.  Will check iron studies, folate, B12. -Follow-up iron studies, folate, B12 -Trend hemoglobin and platelet curve  History of CVA Hyperlipidemia -Resume home Eliquis -Resume home Lipitor 40 mg daily  Chronic bilateral lower extremity edema -Follows cardiology in the outpatient setting, on Lasix 40 mg daily at home -resume home lasix 40mg  daily    Advance Care Planning:   Code Status: Full Code   Consults: urology  Family Communication: no family at bedside  Severity of Illness: The appropriate patient status for this patient is OBSERVATION. Observation status is judged to be reasonable and necessary in order to provide the required intensity of service to ensure the patient's safety. The patient's presenting symptoms, physical exam findings, and initial radiographic and laboratory data in the context of their medical condition is felt  to place them at decreased risk for further clinical deterioration. Furthermore, it is anticipated that the patient will be medically stable for discharge from the hospital within 2 midnights of admission.   Portions of this note were generated with Scientist, clinical (histocompatibility and immunogenetics). Dictation errors may occur despite best attempts at proofreading.  Author: Briscoe Burns, MD 06/18/2023 5:26 PM  For on call review [email or web address].

## 2023-06-18 NOTE — ED Notes (Signed)
 Report given to [facility].

## 2023-06-18 NOTE — ED Provider Notes (Signed)
 [facility]    CSN: [id number] Arrival date & time: 06/18/23  0840      History   Chief Complaint No chief complaint on file.   HPI [name] is a 78 y.o. female.   Pt presents with sudden onset of right flank pain and right lower quadrant pain with nausea and vomiting that started about 8 hours ago.  Denies fevers.  Brought in by her daughter.     Past Medical History:  Diagnosis Date   Actinic keratosis    Followed by Dr. Terri Piedra   Anxiety    Diabetes mellitus, type II, insulin dependent (HCC)    Associated with obesity: She takes Lantus, Victoza & Actos   Diplopia    Essential hypertension    GERD (gastroesophageal reflux disease)    Hyperlipidemia    Morbid obesity with BMI of 40.0-44.9, adult (HCC)    Stroke (HCC) 2021    Patient Active Problem List   Diagnosis Date Noted   Encounter for monitoring flecainide therapy 05/24/2023   Bilateral lower extremity edema 04/20/2023   Aortic stenosis, mild 04/20/2023   Iron deficiency anemia 01/24/2023   Thrombocytopenia (HCC) 01/04/2023   Normocytic anemia 01/04/2023   Paroxysmal atrial fibrillation (HCC) 08/08/2019   Hypercoagulable state due to paroxysmal atrial fibrillation (HCC) 08/08/2019   Acute cerebrovascular accident of cerebellum (HCC) 07/31/2019   GERD (gastroesophageal reflux disease)    Leukocytosis    Morbid obesity (HCC) 01/25/2018   Essential hypertension 12/31/2016   Hyperlipidemia with target LDL less than 70 12/31/2016   DM (diabetes mellitus), type 2 with complications (HCC) 12/31/2016    Past Surgical History:  Procedure Laterality Date   BREAST BIOPSY  1997   CARPAL TUNNEL RELEASE Right 11/12/2021   Procedure: CARPAL TUNNEL RELEASE;  Surgeon: Oliver Barre, MD;  Location: [facility];  Service: Orthopedics;  Laterality: Right;  pt knows to arrive at 6:15   LEFT HEART CATH AND CORONARY ANGIOGRAPHY N/A 01/05/2017   Procedure: LEFT HEART CATH AND CORONARY ANGIOGRAPHY;  Surgeon:  Marykay Lex, MD;  Location: [facility] INVASIVE CV LAB;  Service: Cardiovascular: Angiographically normal coronary arteries with a right dominant system.  Normal EF 55-65%.   LOOP RECORDER INSERTION N/A 08/01/2019   Procedure: LOOP RECORDER INSERTION;  Surgeon: Regan Lemming, MD;  Location: [facility] INVASIVE CV LAB;  Service: Cardiovascular;  Laterality: N/A;    OB History   No obstetric history on file.      Home Medications    Prior to Admission medications   Medication Sig Start Date End Date Taking? Authorizing Provider  acetaminophen (TYLENOL) 650 MG CR tablet Take 650 mg by mouth every 8 (eight) hours as needed for pain.    [provider]  alendronate (FOSAMAX) 70 MG tablet Take 70 mg by mouth once a week. Take with a full glass of water on an empty stomach.    [provider]  ALPRAZolam Prudy Feeler) 0.25 MG tablet Take 0.25 mg by mouth 3 (three) times daily as needed. 11/09/21   [provider]  apixaban (ELIQUIS) 5 MG TABS tablet TAKE ONE TABLET BY MOUTH EVERY MORNING and TAKE ONE TABLET BY MOUTH EVERY EVENING 01/31/23   Fenton, Clint R, PA  atorvastatin (LIPITOR) 40 MG tablet Take 1 tablet (40 mg total) by mouth daily. 08/03/19 05/24/23  Dorcas Carrow, MD  blood glucose meter kit and supplies KIT Dispense based on patient and insurance preference. Use up to four times daily as directed. (FOR ICD-9 250.00,  250.01). 08/02/19   Dorcas Carrow, MD  carvedilol (COREG) 12.5 MG tablet Take 1 tablet (12.5 mg total) by mouth 2 (two) times daily. 11/16/19   Camnitz, Will Daphine Deutscher, MD  diphenhydramine-acetaminophen (TYLENOL PM) 25-500 MG TABS tablet Take 2 tablets by mouth at bedtime.    [provider]  flecainide (TAMBOCOR) 50 MG tablet Take 1 tablet by mouth twice daily 04/06/23   Fenton, Clint R, PA  furosemide (LASIX) 40 MG tablet Take 1 tablet (40 mg total) by mouth daily. 12/07/22 05/24/23  Carlos Levering, NP  Insulin Syringe-Needle U-100 (RELION INSULIN SYR  .3CC/29G) 29G X 1/2" 0.3 ML MISC 1 each by Does not apply route in the morning and at bedtime. 08/02/19   Dorcas Carrow, MD  Lancets (ONETOUCH DELICA PLUS LANCET33G) MISC USE 1 LANCET TO CHECK GLUCOSE UP TO 4 TIMES DAILY 08/02/19   [provider]  LANTUS SOLOSTAR 100 UNIT/ML Solostar Pen Inject 30 Units into the skin in the morning. 11/12/19   [provider]  lisinopril-hydrochlorothiazide (ZESTORETIC) 20-25 MG tablet Take 1 tablet by mouth daily. 09/17/19   [provider]  nitroGLYCERIN (NITROSTAT) 0.4 MG SL tablet Place 1 tablet (0.4 mg total) under the tongue every 5 (five) minutes as needed for chest pain. 12/31/16 05/24/23  Marykay Lex, MD  [facility] VERIO test strip USE 1 STRIP TO CHECK GLUCOSE UP TO 4 TIMES DAILY AS DIRECTED 08/02/19   [provider]  pioglitazone (ACTOS) 45 MG tablet Take 45 mg by mouth daily.    [provider]  RELION PEN NEEDLES 31G X 6 MM MISC USE 1 IN THE MORNING AND AT BEDTIME 08/02/19   [provider]    Family History Family History  Problem Relation Age of Onset   Diabetes Mother    Hyperlipidemia Mother    Stroke Mother 54   Liver cancer Father    Hypertension Sister    Hyperlipidemia Sister    Diabetes Sister        On insulin   Depression Sister    Kidney disease Brother        After dialysis he had renal transplant   Diabetes Brother    Coronary artery disease Brother    Peripheral Artery Disease Brother    Stroke Brother 59   Bladder Cancer Brother    Diabetes Brother     Social History Social History   Tobacco Use   Smoking status: Never   Smokeless tobacco: Never   Tobacco comments:    Never smoke 12/16/21  Substance Use Topics   Alcohol use: No   Drug use: Never     Allergies   Livalo [pitavastatin] and Sulfa antibiotics   Review of Systems Review of Systems  Constitutional:  Negative for chills and fever.  HENT:  Negative for ear pain and sore throat.   Eyes:  Negative  for pain and visual disturbance.  Respiratory:  Negative for cough and shortness of breath.   Cardiovascular:  Negative for chest pain and palpitations.  Gastrointestinal:  Positive for abdominal pain, nausea and vomiting.  Genitourinary:  Positive for flank pain. Negative for dysuria and hematuria.  Musculoskeletal:  Negative for arthralgias and back pain.  Skin:  Negative for color change and rash.  Neurological:  Negative for seizures and syncope.  All other systems reviewed and are negative.    Physical Exam Triage Vital Signs ED Triage Vitals  Encounter Vitals Group     BP 06/18/23 0847 (!) 207/84  Systolic BP Percentile --      Diastolic BP Percentile --      Pulse Rate 06/18/23 0847 63     Resp 06/18/23 0847 14     Temp 06/18/23 0847 (!) 97.1 F (36.2 C)     Temp Source 06/18/23 0847 Oral     SpO2 06/18/23 0847 92 %     Weight --      Height --      Head Circumference --      Peak Flow --      Pain Score 06/18/23 0852 10     Pain Loc --      Pain Education --      Exclude from Growth Chart --    No data found.  Updated Vital Signs BP (!) 207/84 (BP Location: Right Arm)   Pulse 63   Temp (!) 97.1 F (36.2 C) (Oral)   Resp 14   SpO2 92%   Visual Acuity Right Eye Distance:   Left Eye Distance:   Bilateral Distance:    Right Eye Near:   Left Eye Near:    Bilateral Near:     Physical Exam Vitals and nursing note reviewed.  Constitutional:      General: She is not in acute distress.    Appearance: She is well-developed.  HENT:     Head: Normocephalic and atraumatic.  Eyes:     Conjunctiva/sclera: Conjunctivae normal.  Cardiovascular:     Rate and Rhythm: Normal rate and regular rhythm.     Heart sounds: No murmur heard. Pulmonary:     Effort: Pulmonary effort is normal. No respiratory distress.     Breath sounds: Normal breath sounds.  Abdominal:     Palpations: Abdomen is soft.     Tenderness: There is no abdominal tenderness.   Musculoskeletal:        General: No swelling.     Cervical back: Neck supple.  Skin:    General: Skin is warm and dry.     Capillary Refill: Capillary refill takes less than 2 seconds.  Neurological:     Mental Status: She is alert.  Psychiatric:        Mood and Affect: Mood normal.      UC Treatments / Results  Labs (all labs ordered are listed, but only abnormal results are displayed) Labs Reviewed  POCT URINALYSIS DIP (MANUAL ENTRY) - Abnormal; Notable for the following components:      Result Value   Color, UA brown (*)    Clarity, UA cloudy (*)    Glucose, UA =500 (*)    Ketones, POC UA trace (5) (*)    Blood, UA large (*)    Protein Ur, POC >=300 (*)    Leukocytes, UA Small (1+) (*)    All other components within normal limits    EKG   Radiology No results found.  Procedures Procedures (including critical care time)  Medications Ordered in UC Medications  ondansetron (ZOFRAN-ODT) disintegrating tablet 4 mg (4 mg Oral Given 06/18/23 0905)    Initial Impression / Assessment and Plan / UC Course  I have reviewed the triage vital signs and the nursing notes.  Pertinent labs & imaging results that were available during my care of the patient were reviewed by me and considered in my medical decision making (see chart for details).     RLQ pain, flank pain, n/v.  Pt in severe pain, vomiting on exam.  Zofran given.  Given severity of  pain and sudden onset recommend ED evaluation, stone vs appendicitis vs UTI.   Final Clinical Impressions(s) / UC Diagnoses   Final diagnoses:  RLQ abdominal pain  Flank pain  Nausea and vomiting, unspecified vomiting type   Discharge Instructions   None    ED Prescriptions   None    PDMP not reviewed this encounter.   Ward, Tylene Fantasia, PA-C 06/18/23 [phone]

## 2023-06-18 NOTE — ED Notes (Signed)
 Patient is being discharged from the Urgent Care and sent to the Emergency Department via POV . Per Christeen Douglas, PA, patient is in need of higher level of care due to limited resources. Patient is aware and verbalizes understanding of plan of care.  Vitals:   06/18/23 0847  BP: (!) 207/84  Pulse: 63  Resp: 14  Temp: (!) 97.1 F (36.2 C)  SpO2: 92%

## 2023-06-18 NOTE — Transfer of Care (Addendum)
 Immediate Anesthesia Transfer of Care Note  Patient: [name]  Procedure(s) Performed: CYSTOSCOPY, WITH RETROGRADE PYELOGRAM AND URETERAL STENT INSERTION (Right: Ureter)  Patient Location: PACU  Anesthesia Type:General  Level of Consciousness: drowsy and patient cooperative  Airway & Oxygen Therapy: Patient Spontanous Breathing and Patient connected to face mask oxygen  Post-op Assessment: Report given to RN and Post -op Vital signs reviewed and stable  Post vital signs: Reviewed and stable  Last Vitals:  Vitals Value Taken Time  BP 162/48 06/18/23 1601  Temp 37.7 06/18/23   1603  Pulse 67 06/18/23 1602  Resp 15 06/18/23 1602  SpO2 100 % 06/18/23 1602  Vitals shown include unfiled device data.  Last Pain:  Vitals:   06/18/23 1327  TempSrc: Oral  PainSc:          Complications: No notable events documented.

## 2023-06-18 NOTE — ED Triage Notes (Addendum)
 Pt reports she has lower right side abdominal pain and right side back pain since 1 am this morning. Pt states she also had some n/v. This morning as well.

## 2023-06-19 DIAGNOSIS — D509 Iron deficiency anemia, unspecified: Secondary | ICD-10-CM | POA: Diagnosis not present

## 2023-06-19 DIAGNOSIS — R310 Gross hematuria: Secondary | ICD-10-CM | POA: Diagnosis not present

## 2023-06-19 DIAGNOSIS — R109 Unspecified abdominal pain: Secondary | ICD-10-CM | POA: Diagnosis present

## 2023-06-19 DIAGNOSIS — D696 Thrombocytopenia, unspecified: Secondary | ICD-10-CM | POA: Diagnosis not present

## 2023-06-19 DIAGNOSIS — Z87442 Personal history of urinary calculi: Secondary | ICD-10-CM | POA: Diagnosis not present

## 2023-06-19 DIAGNOSIS — Z6841 Body Mass Index (BMI) 40.0 and over, adult: Secondary | ICD-10-CM | POA: Diagnosis not present

## 2023-06-19 DIAGNOSIS — N179 Acute kidney failure, unspecified: Secondary | ICD-10-CM | POA: Diagnosis not present

## 2023-06-19 DIAGNOSIS — Z7901 Long term (current) use of anticoagulants: Secondary | ICD-10-CM | POA: Diagnosis not present

## 2023-06-19 DIAGNOSIS — R112 Nausea with vomiting, unspecified: Secondary | ICD-10-CM | POA: Diagnosis not present

## 2023-06-19 DIAGNOSIS — E860 Dehydration: Secondary | ICD-10-CM | POA: Diagnosis not present

## 2023-06-19 DIAGNOSIS — K8020 Calculus of gallbladder without cholecystitis without obstruction: Secondary | ICD-10-CM | POA: Diagnosis not present

## 2023-06-19 DIAGNOSIS — N136 Pyonephrosis: Secondary | ICD-10-CM | POA: Diagnosis not present

## 2023-06-19 DIAGNOSIS — Z79899 Other long term (current) drug therapy: Secondary | ICD-10-CM | POA: Diagnosis not present

## 2023-06-19 DIAGNOSIS — T383X5A Adverse effect of insulin and oral hypoglycemic [antidiabetic] drugs, initial encounter: Secondary | ICD-10-CM | POA: Diagnosis not present

## 2023-06-19 DIAGNOSIS — K449 Diaphragmatic hernia without obstruction or gangrene: Secondary | ICD-10-CM | POA: Diagnosis not present

## 2023-06-19 DIAGNOSIS — K219 Gastro-esophageal reflux disease without esophagitis: Secondary | ICD-10-CM | POA: Diagnosis not present

## 2023-06-19 DIAGNOSIS — I480 Paroxysmal atrial fibrillation: Secondary | ICD-10-CM | POA: Diagnosis not present

## 2023-06-19 DIAGNOSIS — Z7984 Long term (current) use of oral hypoglycemic drugs: Secondary | ICD-10-CM | POA: Diagnosis not present

## 2023-06-19 DIAGNOSIS — E785 Hyperlipidemia, unspecified: Secondary | ICD-10-CM | POA: Diagnosis not present

## 2023-06-19 DIAGNOSIS — E11649 Type 2 diabetes mellitus with hypoglycemia without coma: Secondary | ICD-10-CM | POA: Diagnosis not present

## 2023-06-19 DIAGNOSIS — K5900 Constipation, unspecified: Secondary | ICD-10-CM | POA: Diagnosis not present

## 2023-06-19 DIAGNOSIS — N3001 Acute cystitis with hematuria: Secondary | ICD-10-CM | POA: Diagnosis not present

## 2023-06-19 DIAGNOSIS — E66813 Obesity, class 3: Secondary | ICD-10-CM | POA: Diagnosis not present

## 2023-06-19 DIAGNOSIS — I110 Hypertensive heart disease with heart failure: Secondary | ICD-10-CM | POA: Diagnosis not present

## 2023-06-19 DIAGNOSIS — N132 Hydronephrosis with renal and ureteral calculous obstruction: Secondary | ICD-10-CM | POA: Diagnosis not present

## 2023-06-19 DIAGNOSIS — Z794 Long term (current) use of insulin: Secondary | ICD-10-CM | POA: Diagnosis not present

## 2023-06-19 DIAGNOSIS — E1165 Type 2 diabetes mellitus with hyperglycemia: Secondary | ICD-10-CM | POA: Diagnosis not present

## 2023-06-19 DIAGNOSIS — N200 Calculus of kidney: Secondary | ICD-10-CM | POA: Diagnosis not present

## 2023-06-19 DIAGNOSIS — N390 Urinary tract infection, site not specified: Secondary | ICD-10-CM | POA: Diagnosis not present

## 2023-06-19 DIAGNOSIS — I5032 Chronic diastolic (congestive) heart failure: Secondary | ICD-10-CM | POA: Diagnosis not present

## 2023-06-19 DIAGNOSIS — Z8249 Family history of ischemic heart disease and other diseases of the circulatory system: Secondary | ICD-10-CM | POA: Diagnosis not present

## 2023-06-19 LAB — BASIC METABOLIC PANEL
Anion gap: 9 (ref 5–15)
BUN: 35 mg/dL — ABNORMAL HIGH (ref 8–23)
CO2: 23 mmol/L (ref 22–32)
Calcium: 8.7 mg/dL — ABNORMAL LOW (ref 8.9–10.3)
Chloride: 103 mmol/L (ref 98–111)
Creatinine, Ser: 1.26 mg/dL — ABNORMAL HIGH (ref 0.44–1.00)
GFR, Estimated: 44 mL/min — ABNORMAL LOW (ref >60)
Glucose, Bld: 210 mg/dL — ABNORMAL HIGH (ref 70–99)
Potassium: 4.2 mmol/L (ref 3.5–5.1)
Sodium: 135 mmol/L (ref 135–145)

## 2023-06-19 LAB — CBC
HCT: 32.5 % — ABNORMAL LOW (ref 36.0–46.0)
Hemoglobin: 10.1 g/dL — ABNORMAL LOW (ref 12.0–15.0)
MCH: 30.6 pg (ref 26.0–34.0)
MCHC: 31.1 g/dL (ref 30.0–36.0)
MCV: 98.5 fL (ref 80.0–100.0)
Platelets: 115 10*3/uL — ABNORMAL LOW (ref 150–400)
RBC: 3.3 MIL/uL — ABNORMAL LOW (ref 3.87–5.11)
RDW: 15.9 % — ABNORMAL HIGH (ref 11.5–15.5)
WBC: 16.5 10*3/uL — ABNORMAL HIGH (ref 4.0–10.5)
nRBC: 0 % (ref 0.0–0.2)

## 2023-06-19 LAB — HEMOGLOBIN A1C
Hgb A1c MFr Bld: 6.7 % — ABNORMAL HIGH (ref 4.8–5.6)
Mean Plasma Glucose: 145.59 mg/dL

## 2023-06-19 LAB — GLUCOSE, CAPILLARY
Glucose-Capillary: 162 mg/dL — ABNORMAL HIGH (ref 70–99)
Glucose-Capillary: 161 mg/dL — ABNORMAL HIGH (ref 70–99)
Glucose-Capillary: 140 mg/dL — ABNORMAL HIGH (ref 70–99)
Glucose-Capillary: 110 mg/dL — ABNORMAL HIGH (ref 70–99)

## 2023-06-19 MED ORDER — FERROUS SULFATE 325 (65 FE) MG PO TABS
325.0000 mg | ORAL_TABLET | ORAL | Status: DC
  Administered 2023-06-20 – 2023-06-22 (×2): 325 mg via ORAL
  Filled 2023-06-19 (×2): qty 1

## 2023-06-19 NOTE — Assessment & Plan Note (Signed)
 -  Continue Lipitor

## 2023-06-19 NOTE — Care Management Obs Status (Signed)
 MEDICARE OBSERVATION STATUS NOTIFICATION   Patient Details  Name: [name] MRN: [id number] Date of Birth: [birth year]   Medicare Observation Status Notification Given:  Yes    Coralyn Helling, LCSW 06/19/2023, 9:41 AM

## 2023-06-19 NOTE — Assessment & Plan Note (Signed)
 Rate controlled - Continue flecainide, Eliquis

## 2023-06-19 NOTE — Assessment & Plan Note (Signed)
 Glucose mostly okay - Continue glargine - Continue SS insulin

## 2023-06-19 NOTE — Assessment & Plan Note (Signed)
 Plts stable relative to baseline.

## 2023-06-19 NOTE — Assessment & Plan Note (Signed)
 Iron sats low - Start oral iron at discharge

## 2023-06-19 NOTE — Hospital Course (Signed)
 78 y.o. F with MO, pAF on Eliquis, dCHF, hx CVA, chronic thrombocytopenia, DM, HTN, HLD who presented with flank pain, found to have infected and obstructing UPJ stone.  Taken for right ureteral stent on 3/22 by Dr. Liliane Shi.

## 2023-06-19 NOTE — Assessment & Plan Note (Signed)
 BP slightly high - Continue hydrochlorothiazide, lisinorpil, Coreg

## 2023-06-19 NOTE — Assessment & Plan Note (Signed)
 Takes her Lasix intermittently at home.  Has mild pitting edema, normal for baseline per patient - Hold Lasix

## 2023-06-19 NOTE — Plan of Care (Signed)
  Problem: Education: Goal: Knowledge of General Education information will improve Description: Including pain rating scale, medication(s)/side effects and non-pharmacologic comfort measures Outcome: Progressing   Problem: Health Behavior/Discharge Planning: Goal: Ability to manage health-related needs will improve Outcome: Progressing   Problem: Activity: Goal: Risk for activity intolerance will decrease Outcome: Progressing   Problem: Nutrition: Goal: Adequate nutrition will be maintained Outcome: Progressing   Problem: Elimination: Goal: Will not experience complications related to bowel motility Outcome: Progressing Goal: Will not experience complications related to urinary retention Outcome: Progressing

## 2023-06-19 NOTE — Assessment & Plan Note (Signed)
 Class III, BMI 40.6

## 2023-06-19 NOTE — Progress Notes (Signed)
  Progress Note   Patient: [name] [id number] DOB: 06-Jan-1946 DOA: 06/18/2023     0 DOS: the patient was seen and examined on 06/19/2023 at 9:45AM      Brief hospital course: 78 y.o. F with MO, pAF on Eliquis, dCHF, hx CVA, chronic thrombocytopenia, DM, HTN, HLD who presented with flank pain, found to have infected and obstructing UPJ stone.  Taken for right ureteral stent on 3/22 by Dr. Liliane Shi.     Assessment and Plan: * Nephrolithiasis Urinary tract infection Stent 3/22 by Urology - Continue Rocephin - Follow urine culture - Trend Cr   Chronic heart failure with preserved ejection fraction (HFpEF) (HCC) Takes her Lasix intermittently at home.  Has mild pitting edema, normal for baseline per patient - Continue Lasix  Iron deficiency anemia Iron sats low - Start oral iron at discharge  Thrombocytopenia (HCC) Plts stable relative to baseline.  Paroxysmal atrial fibrillation (HCC) - Continue flecainide, Eliquis  Morbid obesity (HCC) Class III, BMI 40.6  DM (diabetes mellitus), type 2 with complications (HCC) Glucose mostly okay - Continue glargine - Continue SS insulin  Hyperlipidemia with target LDL less than 70 - Continue Lipitor  Essential hypertension BP slightly high - Continue hydrochlorothiazide, lisinorpil, Coreg          Subjective: Patient with some gross hematuria this morning.  Also groin pain with urinating . No fever, no confusion.  No nursing concerns     Physical Exam: BP (!) 144/53 (BP Location: Right Arm)   Pulse (!) 59   Temp 99 F (37.2 C) (Oral)   Resp 18   Ht 5\' 1"  (1.549 m)   Wt 97.5 kg   SpO2 93%   BMI 40.61 kg/m   Elderly adult female, lying in bed, interactive and appropriate RRR, no murmurs, no peripheral edema Respiratory normal, lungs clear without rales or wheezes Abdomen soft, tenderness over the suprapubic area Attention normal, affect normal, judgment and insight appear normal    Data  Reviewed: Discussed with urology, Dr. Sande Brothers Basic metabolic panel shows creatinine up to 1.2, sodium and potassium normal Glucose normal Iron saturation low CBC shows new white count, mild anemia   Family Communication: Son at the bedside    Disposition: Status is: Inpatient         Author: Alberteen Sam, MD 06/19/2023 3:47 PM  For on call review [email or web address].

## 2023-06-19 NOTE — Assessment & Plan Note (Addendum)
 Urinary tract infection Stent 3/22 by Urology - Continue Rocephin - Follow urine culture - Trend Cr

## 2023-06-20 ENCOUNTER — Encounter: Payer: Self-pay | Admitting: Physician Assistant

## 2023-06-20 ENCOUNTER — Inpatient Hospital Stay: Payer: PPO | Attending: Hematology | Admitting: Hematology

## 2023-06-20 ENCOUNTER — Encounter (HOSPITAL_COMMUNITY): Payer: Self-pay | Admitting: Urology

## 2023-06-20 DIAGNOSIS — N179 Acute kidney failure, unspecified: Secondary | ICD-10-CM | POA: Insufficient documentation

## 2023-06-20 DIAGNOSIS — N200 Calculus of kidney: Secondary | ICD-10-CM | POA: Diagnosis not present

## 2023-06-20 LAB — CBC
HCT: 31.6 % — ABNORMAL LOW (ref 36.0–46.0)
Hemoglobin: 9.7 g/dL — ABNORMAL LOW (ref 12.0–15.0)
MCH: 30.7 pg (ref 26.0–34.0)
MCHC: 30.7 g/dL (ref 30.0–36.0)
MCV: 100 fL (ref 80.0–100.0)
Platelets: 108 10*3/uL — ABNORMAL LOW (ref 150–400)
RBC: 3.16 MIL/uL — ABNORMAL LOW (ref 3.87–5.11)
RDW: 16.1 % — ABNORMAL HIGH (ref 11.5–15.5)
WBC: 11.3 10*3/uL — ABNORMAL HIGH (ref 4.0–10.5)
nRBC: 0 % (ref 0.0–0.2)

## 2023-06-20 LAB — BASIC METABOLIC PANEL
Anion gap: 9 (ref 5–15)
BUN: 53 mg/dL — ABNORMAL HIGH (ref 8–23)
CO2: 24 mmol/L (ref 22–32)
Calcium: 8.7 mg/dL — ABNORMAL LOW (ref 8.9–10.3)
Chloride: 104 mmol/L (ref 98–111)
Creatinine, Ser: 1.59 mg/dL — ABNORMAL HIGH (ref 0.44–1.00)
GFR, Estimated: 33 mL/min — ABNORMAL LOW (ref >60)
Glucose, Bld: 92 mg/dL (ref 70–99)
Potassium: 4.1 mmol/L (ref 3.5–5.1)
Sodium: 137 mmol/L (ref 135–145)

## 2023-06-20 LAB — URINE CULTURE: Culture: <10000 — AB

## 2023-06-20 LAB — GLUCOSE, CAPILLARY
Glucose-Capillary: 77 mg/dL (ref 70–99)
Glucose-Capillary: 115 mg/dL — ABNORMAL HIGH (ref 70–99)
Glucose-Capillary: 116 mg/dL — ABNORMAL HIGH (ref 70–99)
Glucose-Capillary: 179 mg/dL — ABNORMAL HIGH (ref 70–99)

## 2023-06-20 MED ORDER — HYDRALAZINE HCL 20 MG/ML IJ SOLN
10.0000 mg | Freq: Four times a day (QID) | INTRAMUSCULAR | Status: DC | PRN
  Administered 2023-06-21 – 2023-06-22 (×2): 10 mg via INTRAVENOUS
  Filled 2023-06-20 (×2): qty 1

## 2023-06-20 MED ORDER — LACTATED RINGERS IV SOLN: INTRAVENOUS | Status: DC

## 2023-06-20 NOTE — Progress Notes (Signed)
  Progress Note   Patient: [name] [id number] DOB: May 14, 1945 DOA: 06/18/2023     1 DOS: the patient was seen and examined on 06/20/2023 at 9:20AM      Brief hospital course: 77 y.o. F with MO, pAF on Eliquis, dCHF, hx CVA, chronic thrombocytopenia, DM, HTN, HLD who presented with flank pain, found to have infected and obstructing UPJ stone.  Taken for right ureteral stent on 3/22 by Dr. Liliane Shi.     Assessment and Plan: * Nephrolithiasis Urinary tract infection Presented with malaise, chills.  In the ER, found to have hydronephrosis and R UPJ stone.    Taken to the OR for ureteral stent 3/22 by Urology.  Urine culture obtained after antibiotics with low growth. - Continue Rocephin, plan for 10-14 days antibiotics  - Outpatient follow up with Dr. Liliane Shi   AKI (acute kidney injury) [facility]) Baseline Cr 0.9, up to 1.2 post-op, today up to 1.5.  Urine dark. - Start IV fluids - Hold Lasix, ARB - Trend Cr  Chronic heart failure with preserved ejection fraction (HFpEF) (HCC) Takes her Lasix intermittently at home.  Has mild pitting edema, normal for baseline per patient - Hold Lasix  Iron deficiency anemia Iron sats low - COntinue new oral iron  Thrombocytopenia (HCC) Plts stable relative to baseline.  Paroxysmal atrial fibrillation (HCC) Rate controlled - Continue flecainide, Eliquis  Morbid obesity (HCC) Class III, BMI 40.6  DM (diabetes mellitus), type 2 with complications (HCC) Glucose controlled - Continue glargine - Continue SS insulin  Hyperlipidemia with target LDL less than 70 - Continue Lipitor  Essential hypertension BP elevated  - Hold HCT, lisinopril, Lasix - Continue Coreg - PRN hydralazine           Subjective: Tired.  Chills.  Poor urine output.  No further gross hematuria.  Has pain across abdomen with urination.  Has urinary urgency.     Physical Exam: BP (!) 153/50   Pulse (!) 52   Temp 98.2 F (36.8 C) (Oral)   Resp  16   Ht 5\' 1"  (1.549 m)   Wt 97.5 kg   SpO2 92%   BMI 40.61 kg/m   Elderly adult female, sitting up in bed, appears tired and uncomfortable RRR, no murmurs, no pitting lower extremity edema anymore Respiratory rate normal, lungs clear without rales or wheezes Abdomen uncomfortable but no tenderness palpation, no masses, no rigidity or guarding Attention normal, affect tired, face symmetric, speech fluent, generalized weakness but symmetric    Data Reviewed: Discussed with urology Creatinine up to 1.59 CBC shows hemoglobin down to 9.7  Family Communication: Son at the bedside    Disposition: Status is: Inpatient         Author: Alberteen Sam, MD 06/20/2023 5:35 PM  For on call review [email or web address].

## 2023-06-20 NOTE — Plan of Care (Signed)
  Problem: Education: Goal: Knowledge of General Education information will improve Description: Including pain rating scale, medication(s)/side effects and non-pharmacologic comfort measures Outcome: Progressing   Problem: Activity: Goal: Risk for activity intolerance will decrease Outcome: Progressing   Problem: Nutrition: Goal: Adequate nutrition will be maintained Outcome: Progressing   Problem: Elimination: Goal: Will not experience complications related to bowel motility Outcome: Progressing Goal: Will not experience complications related to urinary retention Outcome: Progressing   Problem: Pain Managment: Goal: General experience of comfort will improve and/or be controlled Outcome: Progressing   Problem: Safety: Goal: Ability to remain free from injury will improve Outcome: Progressing

## 2023-06-20 NOTE — Anesthesia Postprocedure Evaluation (Signed)
 Anesthesia Post Note  Patient: Scientist, physiological  Procedure(s) Performed: CYSTOSCOPY, WITH RETROGRADE PYELOGRAM AND URETERAL STENT INSERTION (Right: Ureter)     Patient location during evaluation: PACU Anesthesia Type: General Level of consciousness: awake and alert Pain management: pain level controlled Vital Signs Assessment: post-procedure vital signs reviewed and stable Respiratory status: spontaneous breathing, nonlabored ventilation, respiratory function stable and patient connected to nasal cannula oxygen Cardiovascular status: blood pressure returned to baseline and stable Postop Assessment: no apparent nausea or vomiting Anesthetic complications: no  No notable events documented.  Last Vitals:  Vitals:   06/19/23 2212 06/20/23 0354  BP: (!) 147/55 (!) 161/51  Pulse: 60 (!) 59  Resp: 16 15  Temp: 37.8 C 36.9 C  SpO2: 90% 94%    Last Pain:  Vitals:   06/20/23 0914  TempSrc:   PainSc: 2                  Hameed Kolar L Lorenda Grecco

## 2023-06-20 NOTE — Progress Notes (Signed)
   06/20/23 1105  TOC Brief Assessment  Insurance and Status Reviewed  Patient has primary care physician Yes  Home environment has been reviewed Resides in single family home with family  Prior level of function: Independent with ADLs at baseline  Prior/Current Home Services No current home services  Social Drivers of Health Review SDOH reviewed no interventions necessary  Readmission risk has been reviewed Yes  Transition of care needs no transition of care needs at this time

## 2023-06-20 NOTE — Assessment & Plan Note (Addendum)
 Baseline Cr 0.9, up to 1.2 post-op, today up to 1.5.  Urine dark. - Start IV fluids - Hold Lasix, ARB - Trend Cr

## 2023-06-20 NOTE — Progress Notes (Signed)
 Mobility Specialist - Progress Note   06/20/23 1149  Mobility  Activity Ambulated with assistance in hallway  Level of Assistance Standby assist, set-up cues, supervision of patient - no hands on  Assistive Device Front wheel walker  Distance Ambulated (ft) 150 ft  Range of Motion/Exercises Active  Activity Response Tolerated well  Mobility Referral Yes  Mobility visit 1 Mobility  Mobility Specialist Start Time (ACUTE ONLY) 1130  Mobility Specialist Stop Time (ACUTE ONLY) 1149  Mobility Specialist Time Calculation (min) (ACUTE ONLY) 19 min   Pt was found on recliner chair and agreeable to ambulate. Assisted to [facility] prior and after ambulation. Grew fatigued with session. At EOS returned to recliner chair with all needs met. Call bell in reach and family in room.  Billey Chang Mobility Specialist

## 2023-06-21 ENCOUNTER — Encounter (HOSPITAL_COMMUNITY): Payer: Self-pay | Admitting: Physician Assistant

## 2023-06-21 DIAGNOSIS — N200 Calculus of kidney: Secondary | ICD-10-CM | POA: Diagnosis not present

## 2023-06-21 LAB — INTELLIGEN MYELOID

## 2023-06-21 LAB — CBC
HCT: 28.5 % — ABNORMAL LOW (ref 36.0–46.0)
Hemoglobin: 8.8 g/dL — ABNORMAL LOW (ref 12.0–15.0)
MCH: 30.3 pg (ref 26.0–34.0)
MCHC: 30.9 g/dL (ref 30.0–36.0)
MCV: 98.3 fL (ref 80.0–100.0)
Platelets: 94 10*3/uL — ABNORMAL LOW (ref 150–400)
RBC: 2.9 MIL/uL — ABNORMAL LOW (ref 3.87–5.11)
RDW: 15.9 % — ABNORMAL HIGH (ref 11.5–15.5)
WBC: 6.8 10*3/uL (ref 4.0–10.5)
nRBC: 0 % (ref 0.0–0.2)

## 2023-06-21 LAB — BASIC METABOLIC PANEL
Anion gap: 7 (ref 5–15)
BUN: 43 mg/dL — ABNORMAL HIGH (ref 8–23)
CO2: 24 mmol/L (ref 22–32)
Calcium: 8.2 mg/dL — ABNORMAL LOW (ref 8.9–10.3)
Chloride: 104 mmol/L (ref 98–111)
Creatinine, Ser: 1.17 mg/dL — ABNORMAL HIGH (ref 0.44–1.00)
GFR, Estimated: 48 mL/min — ABNORMAL LOW (ref >60)
Glucose, Bld: 106 mg/dL — ABNORMAL HIGH (ref 70–99)
Potassium: 3.9 mmol/L (ref 3.5–5.1)
Sodium: 135 mmol/L (ref 135–145)

## 2023-06-21 LAB — GLUCOSE, CAPILLARY
Glucose-Capillary: 90 mg/dL (ref 70–99)
Glucose-Capillary: 129 mg/dL — ABNORMAL HIGH (ref 70–99)
Glucose-Capillary: 79 mg/dL (ref 70–99)
Glucose-Capillary: 84 mg/dL (ref 70–99)

## 2023-06-21 MED ORDER — SODIUM CHLORIDE 0.9 % IV SOLN
INTRAVENOUS | Status: AC | PRN
Start: 2023-06-21 — End: 2023-06-22

## 2023-06-21 MED ORDER — SODIUM CHLORIDE 0.9 % IV SOLN
100.0000 mg | Freq: Once | INTRAVENOUS | Status: AC
  Administered 2023-06-21: 100 mg via INTRAVENOUS
  Filled 2023-06-21: qty 5

## 2023-06-21 MED ORDER — ORAL CARE MOUTH RINSE: 15.0000 mL | OROMUCOSAL | Status: DC | PRN

## 2023-06-21 NOTE — Progress Notes (Signed)
 Progress Note   Patient: [name] ZOX:[id number] DOB: 1946-02-15 DOA: 06/18/2023     2 DOS: the patient was seen and examined on 06/21/2023 at 9:05 AM      Brief hospital course: 78 y.o. F with MO, pAF on Eliquis, dCHF, hx CVA, chronic thrombocytopenia, DM, HTN, HLD who presented with flank pain, found to have infected and obstructing UPJ stone.  Taken for right ureteral stent on 3/22 by Dr. Liliane Shi.     Assessment and Plan: * Nephrolithiasis Urinary tract infection Presented with malaise, chills.  In the ER, found to have hydronephrosis and R UPJ stone.    Taken to the OR for ureteral stent 3/22 by Urology.  Urine culture obtained after antibiotics with low growth. - Continue Rocephin, plan for 10-14 days antibiotics, likely oral cephalosporin - Outpatient follow up with Dr. Liliane Shi for stent removal   AKI (acute kidney injury) (HCC) Baseline Cr 0.9.  Post stent, creatinine up to 1.5, urine dark.  Probably due to dehydration IV fluids given, creatinine down to 1.2 today. - Stop IV fluids - Hold Lasix and ARB - Trend creatinine    Iron deficiency anemia Iron sats low.  Platelets 94, hemoglobin continues to trend down, today 8.8. I discussed her Eliquis with urology yesterday.  Given she has no gross hematuria, I will continue Eliquis today - Give IV iron - Continue new oral iron at discharge -Okay to continue Eliquis for now   Anemia and thrombocytopenia Discussed with Dr. Ellin Saba.  Her recent bone marrow biopsy showed no overt malignancy.  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  Has mild pitting edema, normal for baseline per patient - Hold Lasix   Thrombocytopenia (HCC) Plts stable relative to baseline.  Paroxysmal  atrial fibrillation (HCC) Rate controlled - Continue flecainide, Eliquis  Morbid obesity (HCC) Class III, BMI 40.6  DM (diabetes mellitus), type 2 with complications (HCC) Glucose controlled - Continue glargine - Continue SS insulin  Hyperlipidemia with target LDL less than 70 - Continue Lipitor  Essential hypertension BP elevated  - Hold HCT, lisinopril, Lasix - Continue Coreg - PRN hydralazine           Subjective: Patient is feeling more appetite, more energy, she has had no gross hematuria.  Her pain is improving but still present, this is suprapubic pain with urination.  She has had no fever, confusion.     Physical Exam: BP (!) 161/49   Pulse (!) 48   Temp 98.4 F (36.9 C) (Oral)   Resp 20   Ht 5\' 1"  (1.549 m)   Wt 97.5 kg   SpO2 94%   BMI 40.61 kg/m   Elderly adult female, sitting up in bed, appears tired and uncomfortable, RRR, no murmurs, 1+ pitting lower extremity edema, respiratory rate normal, lungs clear without rales or wheezes Abdomen uncomfortable but no tenderness palpation, no masses, no rigidity, no guarding, attention normal, affect tired, face symmetric, speech fluent, generalized weakness but symmetric    Data Reviewed: Basic metabolic panel shows creatinine down to 1.17 CBC shows hemoglobin down to 8.8, platelets stable at 94  Family Communication: None at the bedside    Disposition: Status is: Inpatient 78 year old female with chronic anemia and thrombocytopenia, recent bone marrow biopsy, chronic diastolic heart failure presented  with right flank pain, found to have impacted stone, now status post ureteral stent.  Close to discharge, but hemoglobin trending down, will monitor this 1 additional day, hopefully home tomorrow with oral cephalosporin, hematology follow-up, urology follow-up for stent removal        Author: Alberteen Sam, MD 06/21/2023 4:32 PM  For on call review [email or web address].

## 2023-06-22 ENCOUNTER — Encounter (HOSPITAL_COMMUNITY): Payer: Self-pay | Admitting: Physician Assistant

## 2023-06-22 ENCOUNTER — Encounter (HOSPITAL_COMMUNITY): Payer: Self-pay | Admitting: Urology

## 2023-06-22 ENCOUNTER — Telehealth: Payer: Self-pay | Admitting: Cardiology

## 2023-06-22 ENCOUNTER — Other Ambulatory Visit: Payer: Self-pay | Admitting: Urology

## 2023-06-22 DIAGNOSIS — N200 Calculus of kidney: Secondary | ICD-10-CM | POA: Diagnosis not present

## 2023-06-22 DIAGNOSIS — N3001 Acute cystitis with hematuria: Secondary | ICD-10-CM | POA: Diagnosis not present

## 2023-06-22 LAB — GLUCOSE, CAPILLARY
Glucose-Capillary: 47 mg/dL — ABNORMAL LOW (ref 70–99)
Glucose-Capillary: 106 mg/dL — ABNORMAL HIGH (ref 70–99)
Glucose-Capillary: 117 mg/dL — ABNORMAL HIGH (ref 70–99)

## 2023-06-22 LAB — BASIC METABOLIC PANEL
Anion gap: 6 (ref 5–15)
BUN: 33 mg/dL — ABNORMAL HIGH (ref 8–23)
CO2: 27 mmol/L (ref 22–32)
Calcium: 8.8 mg/dL — ABNORMAL LOW (ref 8.9–10.3)
Chloride: 106 mmol/L (ref 98–111)
Creatinine, Ser: 0.88 mg/dL (ref 0.44–1.00)
GFR, Estimated: >60 mL/min (ref >60)
Glucose, Bld: 48 mg/dL — ABNORMAL LOW (ref 70–99)
Potassium: 3.7 mmol/L (ref 3.5–5.1)
Sodium: 139 mmol/L (ref 135–145)

## 2023-06-22 LAB — CBC
HCT: 31.6 % — ABNORMAL LOW (ref 36.0–46.0)
Hemoglobin: 9.8 g/dL — ABNORMAL LOW (ref 12.0–15.0)
MCH: 30.4 pg (ref 26.0–34.0)
MCHC: 31 g/dL (ref 30.0–36.0)
MCV: 98.1 fL (ref 80.0–100.0)
Platelets: 116 10*3/uL — ABNORMAL LOW (ref 150–400)
RBC: 3.22 MIL/uL — ABNORMAL LOW (ref 3.87–5.11)
RDW: 15.8 % — ABNORMAL HIGH (ref 11.5–15.5)
WBC: 6.9 10*3/uL (ref 4.0–10.5)
nRBC: 0 % (ref 0.0–0.2)

## 2023-06-22 MED ORDER — CEFDINIR 300 MG PO CAPS
300.0000 mg | ORAL_CAPSULE | Freq: Two times a day (BID) | ORAL | 0 refills | Status: AC
Start: 2023-06-22 — End: 2023-07-02

## 2023-06-22 MED ORDER — CEFDINIR 300 MG PO CAPS: 300.0000 mg | ORAL_CAPSULE | Freq: Two times a day (BID) | ORAL | 0 refills | Status: DC

## 2023-06-22 NOTE — Telephone Encounter (Signed)
   Primary Cardiologist: Bryan Lemma, MD  Chart reviewed as part of pre-operative protocol coverage. Given past medical history and time since last visit, based on ACC/AHA guidelines, Natesha Hassey would be at acceptable risk for the planned procedure without further cardiovascular testing.   Patient was advised that if she develops new symptoms prior to surgery to contact our office to arrange a follow-up appointment. She verbalized understanding.  Per office protocol, patient can hold Eliquis for 2 days prior to procedure.   I will route this recommendation to the requesting party via Epic fax function and remove from pre-op pool.  Please call with questions.  Levi Aland, NP-C  06/22/2023, 3:57 PM [address] Office [phone] Fax [phone]

## 2023-06-22 NOTE — Telephone Encounter (Signed)
 Patient with diagnosis of Afib on Eliquis for anticoagulation.    Procedure: Cystoscopy with right ureteroscopy, Laser lithotripsy, stent placement   Date of procedure: 06/28/2023   CHA2DS2-VASc Score = 8   This indicates a 10.8% annual risk of stroke. The patient's score is based upon: CHF History: 1 HTN History: 1 Diabetes History: 1 Stroke History: 2 Vascular Disease History: 0 Age Score: 2 Gender Score: 1     CrCl 56 mL/min Platelet count 116 K   Per office protocol, patient can hold Eliquis for 2 days prior to procedure.     **This guidance is not considered finalized until pre-operative APP has relayed final recommendations.**

## 2023-06-22 NOTE — Progress Notes (Signed)
 Hypoglycemic Event  CBG: 47  Treatment: 8 oz juice/soda  Symptoms: None  Follow-up CBG: Time:0752 CBG Result:106   Possible Reasons for Event: Unknown  Comments/MD notified: Dr. Concepcion Elk made aware    Bernedette Auston, Joyice Faster

## 2023-06-22 NOTE — Progress Notes (Signed)
 Marland Kitchen

## 2023-06-22 NOTE — Discharge Summary (Addendum)
 Physician Discharge Summary   Patient: [name] MRN: [id number] DOB: 05-24-1945  Admit date:     06/18/2023  Discharge date: 06/22/23  Discharge Physician: Loraine Leriche   PCP: Joycelyn Rua, MD   Recommendations at discharge:   Take antibiotics as prescribed  Follow up with Urology as scheduled  Please make follow up Hematology appointment as discussed   Discharge Diagnoses: Principal Problem:   Nephrolithiasis Active Problems:    Essential hypertension   Hyperlipidemia with target LDL less than 70   DM (diabetes mellitus), type 2 with complications (HCC)   Morbid obesity (HCC)   Paroxysmal atrial fibrillation (HCC)   Hypercoagulable state due to paroxysmal atrial fibrillation (HCC)   Thrombocytopenia (HCC)   Iron deficiency anemia   Chronic heart failure with preserved ejection fraction (HFpEF) (HCC)  Resolved Problems: AKI  Hospital Course: 78 y.o. F with MO, pAF on Eliquis, dCHF, hx CVA, chronic thrombocytopenia, DM, HTN, HLD who presented with flank pain, found to have infected and obstructing UPJ stone.  Taken for right ureteral stent on 3/22, tolerated well and stable from a Urologic standpoint the following day. Discharge was held due to down trending hemoglobin, which has stabilized today. She did have an episode of asymptomatic hypoglycemia today, likely due to glargine given last night. She does not take insulin at home and  is tolerating oral intake.  She does report constipation and denies having a BM this hospitalization. Offered to start a bowel regimen but patient declined and elected to manage at home with over the counter laxatives.   Patient and son present at bedside demonstrated understanding and in agreement with the plan for discharge home today to follow up with specialists as detailed below. All questions answered. Medically stable for discharge at this time.  Assessment and Plan: * Nephrolithiasis  Urinary tract infection  Presented with  malaise, chills.  In the ER, found to have hydronephrosis and R UPJ stone.    Taken to the OR for ureteral stent 3/22 by Urology.  Urine culture obtained after antibiotics with low growth. Truman Hayward rx'd for an additional 10 days of antibiotic coverage - Outpatient follow up with Dr. Liliane Shi as scheduled   AKI (acute kidney injury) (HCC) -resolved.  Chronic heart failure with preserved ejection fraction (HFpEF) (HCC) -euvolemic, resume home medications as prescribed   Iron deficiency anemia S/p recent bone marrow biopsy, on IV iron outpatient. Hgb stable. F/u hematology as scheduled.  No obvious bleeding- ok to continue DOAC   Thrombocytopenia (HCC) Plts stable relative to baseline.  Paroxysmal atrial fibrillation (HCC) Rate controlled - Continue flecainide, Eliquis  Morbid obesity (HCC) Class III, BMI 40.6  DM (diabetes mellitus), type 2 with complications (HCC) Stable. Continue home medications  F/u PCP   Hyperlipidemia with target LDL less than 70 - Continue Lipitor  Essential hypertension BP elevated while here because BP meds held.  Ok to resume this as prescribed. F/u PCP          Consultants: Dr. Liliane Shi, Urology  Procedures performed:  Procedure(s): 06/18/23 1.  Cystoscopy with right ureteral stent placement 2.  Right retrograde pyelogram with intraoperative interpretation fluoroscopic imaging Disposition: Home Diet recommendation:  Discharge Diet Orders (From admission, onward)     Start     Ordered   06/22/23 0000  Diet - low sodium heart healthy        06/22/23 1501            DISCHARGE MEDICATION: Allergies as of 06/22/2023  Reactions   Tape Other (See Comments)   SKIN IS VERY THIN!!    Livalo [pitavastatin] Other (See Comments)   MYALGIAS   Sulfa Antibiotics Itching, Other (See Comments)   "Red spots," also        Medication List     STOP taking these medications    Lantus SoloStar 100 UNIT/ML Solostar Pen Generic drug:  insulin glargine       TAKE these medications    acetaminophen 650 MG CR tablet Commonly known as: TYLENOL Take 650-1,300 mg by mouth every 8 (eight) hours as needed for pain.   alendronate 70 MG tablet Commonly known as: FOSAMAX Take 70 mg by mouth See admin instructions. Take 70 mg by mouth at bedtime every Saturday. Take with a full glass of water on an empty stomach.   apixaban 5 MG Tabs tablet Commonly known as: Eliquis TAKE ONE TABLET BY MOUTH EVERY MORNING and TAKE ONE TABLET BY MOUTH EVERY EVENING What changed:  how much to take how to take this when to take this additional instructions   Astepro 205.5 MCG/SPRAY Soln Generic drug: Azelastine HCl Place 1-2 sprays into both nostrils every 12 (twelve) hours as needed (for allergies or rhinitis).   atorvastatin 40 MG tablet Commonly known as: LIPITOR Take 1 tablet (40 mg total) by mouth daily. What changed:  when to take this additional instructions   blood glucose meter kit and supplies Kit Dispense based on patient and insurance preference. Use up to four times daily as directed. (FOR ICD-9 250.00, 250.01).   carvedilol 12.5 MG tablet Commonly known as: COREG Take 1 tablet (12.5 mg total) by mouth 2 (two) times daily.   cefdinir 300 MG capsule Commonly known as: OMNICEF Take 1 capsule (300 mg total) by mouth 2 (two) times daily for 10 days.   flecainide 50 MG tablet Commonly known as: TAMBOCOR Take 1 tablet by mouth twice daily   furosemide 40 MG tablet Commonly known as: LASIX Take 1 tablet (40 mg total) by mouth daily. What changed: how much to take   lisinopril-hydrochlorothiazide 20-25 MG tablet Commonly known as: ZESTORETIC Take 1 tablet by mouth at bedtime.   nitroGLYCERIN 0.4 MG SL tablet Commonly known as: NITROSTAT Place 1 tablet (0.4 mg total) under the tongue every 5 (five) minutes as needed for chest pain.   OneTouch Delica Plus Lancet33G Misc USE 1 LANCET TO CHECK GLUCOSE UP TO 4  TIMES DAILY   OneTouch Verio test strip Generic drug: glucose blood USE 1 STRIP TO CHECK GLUCOSE UP TO 4 TIMES DAILY AS DIRECTED   RELION INSULIN SYR .3CC/29G 29G X 1/2" 0.3 ML Misc Generic drug: Insulin Syringe-Needle U-100 1 each by Does not apply route in the morning and at bedtime.   ReliOn Pen Needles 31G X 6 MM Misc Generic drug: Insulin Pen Needle USE 1 IN THE MORNING AND AT BEDTIME        Discharge Exam: Filed Weights   06/18/23 1007 06/18/23 1803  Weight: 108 kg 97.5 kg   Physical Exam Vitals and nursing note reviewed.  Constitutional:      General: She is not in acute distress.    Appearance: She is not ill-appearing or toxic-appearing.  HENT:     Head: Normocephalic and atraumatic.  Eyes:     Extraocular Movements: Extraocular movements intact.     Pupils: Pupils are equal, round, and reactive to light.  Cardiovascular:     Rate and Rhythm: Normal rate. Rhythm irregular.  Pulmonary:  Effort: Pulmonary effort is normal.     Breath sounds: Normal breath sounds.  Abdominal:     General: There is no distension.     Palpations: Abdomen is soft.     Tenderness: There is no abdominal tenderness. There is no right CVA tenderness or left CVA tenderness.  Musculoskeletal:        General: Normal range of motion.     Cervical back: Neck supple.     Right lower leg: No edema.     Left lower leg: No edema.  Skin:    General: Skin is warm and dry.  Neurological:     General: No focal deficit present.     Mental Status: She is alert. Mental status is at baseline.  Psychiatric:        Mood and Affect: Mood normal.        Behavior: Behavior normal.        Thought Content: Thought content normal.      Condition at discharge: stable  The results of significant diagnostics from this hospitalization (including imaging, microbiology, ancillary and laboratory) are listed below for reference.   Imaging Studies: DG C-Arm 1-60 Min-No Report Result Date:  06/18/2023 Fluoroscopy was utilized by the requesting physician.  No radiographic interpretation.   CT ABDOMEN PELVIS W CONTRAST Result Date: 06/18/2023 CLINICAL DATA:  Acute right-sided abdominal and flank pain. Nausea and vomiting. EXAM: CT ABDOMEN AND PELVIS WITH CONTRAST TECHNIQUE: Multidetector CT imaging of the abdomen and pelvis was performed using the standard protocol following bolus administration of intravenous contrast. RADIATION DOSE REDUCTION: This exam was performed according to the departmental dose-optimization program which includes automated exposure control, adjustment of the mA and/or kV according to patient size and/or use of iterative reconstruction technique. CONTRAST:  OMNIPAQUE IOHEXOL 300 MG/ML  SOLN COMPARISON:  None Available. FINDINGS: Lower Chest: Moderate cardiomegaly and tiny pericardial effusion. Tiny bilateral pleural effusions also noted. Hepatobiliary: No suspicious hepatic masses identified. Tiny gallstones noted, without signs of cholecystitis or biliary ductal dilatation. Pancreas:  No mass or inflammatory changes. Spleen: Within normal limits in size and appearance. Adrenals/Urinary Tract: No suspicious masses identified. Mild to moderate right hydronephrosis and perinephric stranding is seen due to a 6 mm calculus at the right UPJ. A 10 mm nonobstructing calculus is also seen in the right renal pelvis. Stomach/Bowel: Small hiatal hernia noted. No evidence of obstruction, inflammatory process or abnormal fluid collections. Vascular/Lymphatic: No pathologically enlarged lymph nodes. No acute vascular findings. Reproductive:  No mass or other significant abnormality. Other:  None. Musculoskeletal:  No suspicious bone lesions identified. IMPRESSION: Mild to moderate right hydronephrosis and perinephric stranding due to 6 mm calculus at the right UPJ. 10 mm nonobstructing calculus in right renal pelvis. Cholelithiasis. No radiographic evidence of cholecystitis. Small  hiatal hernia. Electronically Signed   By: Danae Orleans M.D.   On: 06/18/2023 12:08   CT BONE MARROW BIOPSY & ASPIRATION Result Date: 06/13/2023 INDICATION: Pancytopenia, macrocytosis EXAM: CT GUIDED BONE MARROW ASPIRATION AND CORE BIOPSY Interventional Radiologist:  Sterling Big, MD MEDICATIONS: None. ANESTHESIA/SEDATION: Moderate (conscious) sedation was employed during this procedure. A total of 1.5 milligrams versed and 50 micrograms fentanyl were administered intravenously. The patient's level of consciousness and vital signs were monitored continuously by radiology nursing throughout the procedure under my direct supervision. Total monitored sedation time: 10 minutes FLUOROSCOPY: None. COMPLICATIONS: None immediate. Estimated blood loss: <25 mL PROCEDURE: Informed written consent was obtained from the patient after a thorough discussion of the  procedural risks, benefits and alternatives. All questions were addressed. Maximal Sterile Barrier Technique was utilized including caps, mask, sterile gowns, sterile gloves, sterile drape, hand hygiene and skin antiseptic. A timeout was performed prior to the initiation of the procedure. The patient was positioned prone and non-contrast localization CT was performed of the pelvis to demonstrate the iliac marrow spaces. Maximal barrier sterile technique utilized including caps, mask, sterile gowns, sterile gloves, large sterile drape, hand hygiene, and betadine prep. Under sterile conditions and local anesthesia, an 11 gauge coaxial bone biopsy needle was advanced into the right iliac marrow space. Needle position was confirmed with CT imaging. Initially, bone marrow aspiration was performed. Next, the 11 gauge outer cannula was utilized to obtain a right iliac bone marrow core biopsy. Needle was removed. Hemostasis was obtained with compression. The patient tolerated the procedure well. Samples were prepared with the cytotechnologist. IMPRESSION: Successful  CT-guided bone marrow aspiration and core biopsy of the right iliac bone. Electronically Signed   By: Malachy Moan M.D.   On: 06/13/2023 10:30   CUP PACEART REMOTE DEVICE CHECK Result Date: 05/30/2023 ILR summary report received. Battery status OK. Normal device function. No new symptom, tachy, brady, or pause episodes. No new AF episodes. Monthly summary reports and ROV/PRN Leslie, CVRS   Microbiology: Results for orders placed or performed during the hospital encounter of 06/18/23  Urine Culture (for pregnant, neutropenic or urologic patients or patients with an indwelling urinary catheter)     Status: Abnormal   Collection Time: 06/19/23  4:40 AM   Specimen: Urine, Clean Catch  Result Value Ref Range Status   Specimen Description   Final    URINE, CLEAN CATCH Performed at [facility], [address], [place], [place] [address]    Special Requests   Final    NONE Performed at [facility], [address], [place], [place] [address]    Culture (A)  Final    <10,000 COLONIES/mL INSIGNIFICANT GROWTH Performed at [facility], [address], [place], [place] [address]    Report Status 06/20/2023 FINAL  Final    Labs: CBC: Recent Labs  Lab 06/18/23 1017 06/19/23 0352 06/20/23 0341 06/21/23 0407 06/22/23 0429  WBC 9.2 16.5* 11.3* 6.8 6.9  NEUTROABS 8.4*  --   --   --   --   HGB 11.3* 10.1* 9.7* 8.8* 9.8*  HCT 35.8* 32.5* 31.6* 28.5* 31.6*  MCV 97.5 98.5 100.0 98.3 98.1  PLT 134* 115* 108* 94* 116*   Basic Metabolic Panel: Recent Labs  Lab 06/18/23 1017 06/19/23 0352 06/20/23 0341 06/21/23 0407 06/22/23 0429  NA 136 135 137 135 139  K 4.3 4.2 4.1 3.9 3.7  CL 99 103 104 104 106  CO2 25 23 24 24 27   GLUCOSE 237* 210* 92 106* 48*  BUN 27* 35* 53* 43* 33*  CREATININE 0.75 1.26* 1.59* 1.17* 0.88  CALCIUM 9.4 8.7* 8.7* 8.2* 8.8*   Liver Function Tests: Recent Labs  Lab 06/18/23 1017  AST 30  ALT 38  ALKPHOS 102   BILITOT 1.4*  PROT 7.6  ALBUMIN 4.0   CBG: Recent Labs  Lab 06/21/23 1652 06/21/23 2118 06/22/23 0716 06/22/23 0752 06/22/23 1203  GLUCAP 79 84 47* 106* 117*    Discharge time spent: greater than 30 minutes.  Signed: Loraine Leriche, DO [facility] 06/22/2023

## 2023-06-22 NOTE — Telephone Encounter (Signed)
   Pre-operative Risk Assessment    Patient Name: [name]  DOB: [birth year] MRN: [id number]      Request for Surgical Clearance    Procedure:   Cystoscopy with right ureteroscopy, Laser lithotripsy, stent placement  Date of Surgery:  Clearance 06/28/23                                 Surgeon:  Dr Thayer Ohm Liliane Shi  Surgeon's Group or Practice Name:  [facility] Phone number:  [phone] Fax number:  [phone]   Type of Clearance Requested:   - Medical  - Pharmacy:  Hold Apixaban (Eliquis) 2 days   Type of Anesthesia:  General    Additional requests/questions:    Nilda Riggs   06/22/2023, 1:29 PM

## 2023-06-23 ENCOUNTER — Other Ambulatory Visit: Payer: Self-pay

## 2023-06-23 ENCOUNTER — Encounter (HOSPITAL_COMMUNITY): Payer: Self-pay | Admitting: Urology

## 2023-06-23 NOTE — Progress Notes (Addendum)
 PCP - Dr. Carlyon Shadow Cardiologist - Bryan Lemma , MD  clearance 06-22-23 Eligha Bridegroom, NP epic  PPM/ICD -  Device Orders -  Rep Notified -   Chest x-ray - 11-18-22 epic EKG - 05-24-23 epic Stress Test -  ECHO - 11-26-22 epic Cardiac Cath -   Sleep Study -  CPAP -   Fasting Blood Sugar - 80-120 Checks Blood Sugar __1___ times a day  Blood Thinner Instructions:Eliquis hold 2 days per cardiology note last dose 06-25-23  Aspirin Instructions:  ERAS Protcol - PRE-SURGERY n/a   COVID vaccine -no  Activity--Able to complete ADL's with no CP or SOB  Anesthesia review: CVA x2, CHF, loop recorder, A-fib, HTN,DM Lantus on hold until p. F/U with PCP 06-30-23 due to low BS in ED, mild to mod aortic stenosis  Patient denies shortness of breath, fever, cough and chest pain at PAT appointment   All instructions explained to the patient, with a verbal understanding of the material. Patient agrees to go over the instructions while at home for a better understanding. Patient also instructed to self quarantine after being tested for COVID-19. The opportunity to ask questions was provided.

## 2023-06-24 NOTE — Progress Notes (Signed)
 Anesthesia Chart Review   Case: [id number] Date/Time: 06/28/23 0945   Procedure: CYSTOSCOPY/URETEROSCOPY/HOLMIUM LASER/STENT PLACEMENT (Right) - CYSTOSCOPY/RIGHT URETEROSCOPY/HOLMIUM LASER LITHOTRIPSY/STENT PLACEMENT   Anesthesia type: General   Diagnosis:      Ureteral stone [N20.1]     Renal stone [N20.0]   Pre-op diagnosis: RIGHT URETERAL AND RENAL STONES   Location: [facility] ROOM 01 / [facility]   Surgeons: Rene Paci, MD       DISCUSSION:77 y.o. never smoker with h/o HTN, DM II, stroke, atrial fibrillation, mild to moderate AS, right ureteral and renal stones scheduled for above procedure 06/28/2023 with Dr. Cristal Deer Liliane Shi.   Per cardiology preoperative evaluation 06/22/2023, "Chart reviewed as part of pre-operative protocol coverage. Given past medical history and time since last visit, based on ACC/AHA guidelines, Gwenneth Whiteman would be at acceptable risk for the planned procedure without further cardiovascular testing.    Patient was advised that if she develops new symptoms prior to surgery to contact our office to arrange a follow-up appointment. She verbalized understanding.   Per office protocol, patient can hold Eliquis for 2 days prior to procedure."  VS: Ht 5\' 1"  (1.549 m)   Wt 97.1 kg   BMI 40.43 kg/m   PROVIDERS: Joycelyn Rua, MD is PCP   Primary Cardiologist: Bryan Lemma, MD   LABS: Labs reviewed: Acceptable for surgery. (all labs ordered are listed, but only abnormal results are displayed)  Labs Reviewed - No data to display   IMAGES:   EKG:   CV: Echo 11/26/2022  1. Left ventricular ejection fraction, by estimation, is 60 to 65%. The  left ventricle has normal function. The left ventricle has no regional  wall motion abnormalities. There is mild left ventricular hypertrophy.  Left ventricular diastolic parameters  are consistent with Grade II diastolic dysfunction (pseudonormalization).  Elevated left atrial pressure.   2. Right  ventricular systolic function is normal. The right ventricular  size is mildly enlarged. There is moderately elevated pulmonary artery  systolic pressure. The estimated right ventricular systolic pressure is  49.3 mmHg.   3. Left atrial size was moderately dilated.   4. A small pericardial effusion is present.   5. The mitral valve is degenerative. Trivial mitral valve regurgitation.  Severe mitral annular calcification.   6. The aortic valve was not well visualized. There is moderate  calcification of the aortic valve. Aortic valve regurgitation is not  visualized. Mild to moderate aortic valve stenosis. Vmax 2.3 m/s, MG 11  mmHg, AVA 1.3 cm^2, DI 0.5   7. The inferior vena cava is dilated in size with <50% respiratory  variability, suggesting right atrial pressure of 15 mmHg.   Cardiac Cath 01/05/2017  Angiographically normal coronary arteries with a right dominant system. The left ventricular systolic function is normal. LV end diastolic pressure is normal. The left ventricular ejection fraction is 55-65% by visual estimate. There is no mitral valve regurgitation. There is no aortic valve stenosis.   Angiographically normal coronary arteries premature excluding unstable angina.  Cannot exclude coronary spasm however. Past Medical History:  Diagnosis Date   A-fib (HCC)    Actinic keratosis    Followed by Dr. Terri Piedra   Anemia    Anxiety    Aortic stenosis    Diabetes mellitus, type II, insulin dependent (HCC)    Associated with obesity: She takes Lantus, Victoza & Actos   Diplopia    Dysrhythmia    Afib Loop recorder in place   Essential hypertension    GERD (  gastroesophageal reflux disease)    Heart murmur    History of kidney stones    Hyperlipidemia    Morbid obesity with BMI of 40.0-44.9, adult [facility])    Stroke (HCC) 2021   x2    Past Surgical History:  Procedure Laterality Date   BREAST BIOPSY  1997   CARPAL TUNNEL RELEASE Right 11/12/2021   Procedure: CARPAL  TUNNEL RELEASE;  Surgeon: Oliver Barre, MD;  Location: [facility];  Service: Orthopedics;  Laterality: Right;  pt knows to arrive at 6:15   CYSTOSCOPY W/ URETERAL STENT PLACEMENT Right 06/18/2023   Procedure: CYSTOSCOPY, WITH RETROGRADE PYELOGRAM AND URETERAL STENT INSERTION;  Surgeon: Rene Paci, MD;  Location[facility];  Service: Urology;  Laterality: Right;   LEFT HEART CATH AND CORONARY ANGIOGRAPHY N/A 01/05/2017   Procedure: LEFT HEART CATH AND CORONARY ANGIOGRAPHY;  Surgeon: Marykay Lex, MD;  Location: [facility] INVASIVE CV LAB;  Service: Cardiovascular: Angiographically normal coronary arteries with a right dominant system.  Normal EF 55-65%.   LOOP RECORDER INSERTION N/A 08/01/2019   Procedure: LOOP RECORDER INSERTION;  Surgeon: Regan Lemming, MD;  Location: [facility] INVASIVE CV LAB;  Service: Cardiovascular;  Laterality: N/A;    MEDICATIONS: No current facility-administered medications for this encounter.    acetaminophen (TYLENOL) 650 MG CR tablet   alendronate (FOSAMAX) 70 MG tablet   apixaban (ELIQUIS) 5 MG TABS tablet   ASTEPRO 205.5 MCG/SPRAY SOLN   atorvastatin (LIPITOR) 40 MG tablet   blood glucose meter kit and supplies KIT   carvedilol (COREG) 12.5 MG tablet   cefdinir (OMNICEF) 300 MG capsule   flecainide (TAMBOCOR) 50 MG tablet   furosemide (LASIX) 40 MG tablet   Insulin Syringe-Needle U-100 (RELION INSULIN SYR .3CC/29G) 29G X 1/2" 0.3 ML MISC   Lancets (ONETOUCH DELICA PLUS LANCET33G) MISC   lisinopril-hydrochlorothiazide (ZESTORETIC) 20-25 MG tablet   nitroGLYCERIN (NITROSTAT) 0.4 MG SL tablet   ONETOUCH VERIO test strip   RELION PEN NEEDLES 31G X 6 MM MISC     [facility] Ward, PA-C [facility] Pre-Surgical Testing [phone]

## 2023-06-24 NOTE — Anesthesia Preprocedure Evaluation (Addendum)
 Anesthesia Evaluation  Patient identified by MRN, date of birth, ID band Patient awake    Reviewed: Allergy & Precautions, NPO status , Patient's Chart, lab work & pertinent test results, reviewed documented beta blocker date and time   History of Anesthesia Complications Negative for: history of anesthetic complications  Airway Mallampati: II  TM Distance: >3 FB Neck ROM: Full    Dental  (+) Dental Advisory Given   Pulmonary sleep apnea and Continuous Positive Airway Pressure Ventilation    breath sounds clear to auscultation       Cardiovascular hypertension, Pt. on medications and Pt. on home beta blockers (-) angina (-) CAD + dysrhythmias Atrial Fibrillation + Valvular Problems/Murmurs AS  Rhythm:Irregular Rate:Normal + Systolic murmurs '24 ECHO: EF 60 to 65%.  1.The LV has normal function, no regional wall motion abnormalities. There is mild LVH. Grade II diastolic dysfunction (pseudonormalization). Elevated left atrial pressure.   2. RVF is normal. The right ventricular size is mildly enlarged. There is moderately elevated pulmonary artery systolic pressure. The estimated right ventricular systolic pressure is 49.3 mmHg.   3. Left atrial size was moderately dilated.   4. A small pericardial effusion is present.   5. The mitral valve is degenerative. Trivial mitral valve regurgitation. Severe mitral annular calcification.   6. The aortic valve was not well visualized. There is moderate calcification of the aortic valve. Aortic valve regurgitation is not visualized. Mild to moderate aortic valve stenosis. Vmax 2.3 m/s, MG 11 mmHg, AVA 1.3 cm^2, DI 0.5     Neuro/Psych   Anxiety     CVA, No Residual Symptoms    GI/Hepatic Neg liver ROS,GERD  Controlled,,  Endo/Other  diabetes (glu 193), Insulin Dependent  BMI 40  Renal/GU Renal InsufficiencyRenal disease     Musculoskeletal   Abdominal   Peds  Hematology  (+) Blood  dyscrasia (Hb 9.8, plt 116k), anemia Eliquis: last dose saturday   Anesthesia Other Findings   Reproductive/Obstetrics                             Anesthesia Physical Anesthesia Plan  ASA: 3  Anesthesia Plan: General   Post-op Pain Management: Tylenol PO (pre-op)*   Induction: Intravenous  PONV Risk Score and Plan: 3 and Ondansetron, Dexamethasone and Treatment may vary due to age or medical condition  Airway Management Planned: LMA  Additional Equipment: None  Intra-op Plan:   Post-operative Plan:   Informed Consent: I have reviewed the patients History and Physical, chart, labs and discussed the procedure including the risks, benefits and alternatives for the proposed anesthesia with the patient or authorized representative who has indicated his/her understanding and acceptance.     Dental advisory given  Plan Discussed with: CRNA and Surgeon  Anesthesia Plan Comments: (See PAT note 06/24/2023)       Anesthesia Quick Evaluation

## 2023-06-28 ENCOUNTER — Ambulatory Visit (HOSPITAL_BASED_OUTPATIENT_CLINIC_OR_DEPARTMENT_OTHER): Payer: Self-pay | Admitting: Physician Assistant

## 2023-06-28 ENCOUNTER — Ambulatory Visit (HOSPITAL_COMMUNITY)
Admission: RE | Admit: 2023-06-28 | Discharge: 2023-06-28 | Disposition: A | Discharge status: Home / Self Care | Attending: Urology | Admitting: Urology

## 2023-06-28 ENCOUNTER — Other Ambulatory Visit: Payer: Self-pay

## 2023-06-28 ENCOUNTER — Encounter (HOSPITAL_COMMUNITY): Payer: Self-pay | Admitting: Urology

## 2023-06-28 ENCOUNTER — Ambulatory Visit (HOSPITAL_COMMUNITY)

## 2023-06-28 ENCOUNTER — Ambulatory Visit (HOSPITAL_COMMUNITY): Payer: Self-pay | Admitting: Physician Assistant

## 2023-06-28 ENCOUNTER — Encounter (HOSPITAL_COMMUNITY)
Admission: RE | Disposition: A | Discharge status: Home / Self Care | Payer: Self-pay | Source: Home / Self Care | Attending: Urology

## 2023-06-28 ENCOUNTER — Encounter (HOSPITAL_COMMUNITY): Payer: Self-pay | Admitting: Physician Assistant

## 2023-06-28 DIAGNOSIS — E119 Type 2 diabetes mellitus without complications: Secondary | ICD-10-CM | POA: Insufficient documentation

## 2023-06-28 DIAGNOSIS — N132 Hydronephrosis with renal and ureteral calculous obstruction: Secondary | ICD-10-CM | POA: Insufficient documentation

## 2023-06-28 DIAGNOSIS — F419 Anxiety disorder, unspecified: Secondary | ICD-10-CM | POA: Diagnosis not present

## 2023-06-28 DIAGNOSIS — N200 Calculus of kidney: Secondary | ICD-10-CM | POA: Diagnosis present

## 2023-06-28 DIAGNOSIS — I4891 Unspecified atrial fibrillation: Secondary | ICD-10-CM | POA: Diagnosis not present

## 2023-06-28 DIAGNOSIS — I5032 Chronic diastolic (congestive) heart failure: Secondary | ICD-10-CM | POA: Diagnosis not present

## 2023-06-28 DIAGNOSIS — I3139 Other pericardial effusion (noninflammatory): Secondary | ICD-10-CM | POA: Insufficient documentation

## 2023-06-28 DIAGNOSIS — K8020 Calculus of gallbladder without cholecystitis without obstruction: Secondary | ICD-10-CM | POA: Insufficient documentation

## 2023-06-28 DIAGNOSIS — G4733 Obstructive sleep apnea (adult) (pediatric): Secondary | ICD-10-CM

## 2023-06-28 DIAGNOSIS — D649 Anemia, unspecified: Secondary | ICD-10-CM | POA: Insufficient documentation

## 2023-06-28 DIAGNOSIS — K449 Diaphragmatic hernia without obstruction or gangrene: Secondary | ICD-10-CM | POA: Insufficient documentation

## 2023-06-28 DIAGNOSIS — I10 Essential (primary) hypertension: Secondary | ICD-10-CM | POA: Insufficient documentation

## 2023-06-28 DIAGNOSIS — I110 Hypertensive heart disease with heart failure: Secondary | ICD-10-CM | POA: Diagnosis not present

## 2023-06-28 DIAGNOSIS — K219 Gastro-esophageal reflux disease without esophagitis: Secondary | ICD-10-CM | POA: Diagnosis not present

## 2023-06-28 DIAGNOSIS — I350 Nonrheumatic aortic (valve) stenosis: Secondary | ICD-10-CM | POA: Insufficient documentation

## 2023-06-28 DIAGNOSIS — Z87442 Personal history of urinary calculi: Secondary | ICD-10-CM | POA: Diagnosis not present

## 2023-06-28 DIAGNOSIS — N202 Calculus of kidney with calculus of ureter: Secondary | ICD-10-CM | POA: Diagnosis not present

## 2023-06-28 DIAGNOSIS — N201 Calculus of ureter: Secondary | ICD-10-CM | POA: Diagnosis not present

## 2023-06-28 DIAGNOSIS — Z833 Family history of diabetes mellitus: Secondary | ICD-10-CM | POA: Insufficient documentation

## 2023-06-28 DIAGNOSIS — G4730 Sleep apnea, unspecified: Secondary | ICD-10-CM | POA: Diagnosis not present

## 2023-06-28 DIAGNOSIS — E118 Type 2 diabetes mellitus with unspecified complications: Secondary | ICD-10-CM

## 2023-06-28 DIAGNOSIS — Z794 Long term (current) use of insulin: Secondary | ICD-10-CM | POA: Insufficient documentation

## 2023-06-28 DIAGNOSIS — Z6841 Body Mass Index (BMI) 40.0 and over, adult: Secondary | ICD-10-CM | POA: Insufficient documentation

## 2023-06-28 DIAGNOSIS — I480 Paroxysmal atrial fibrillation: Secondary | ICD-10-CM | POA: Diagnosis not present

## 2023-06-28 HISTORY — DX: Anemia, unspecified: D64.9

## 2023-06-28 HISTORY — DX: Unspecified atrial fibrillation: I48.91

## 2023-06-28 HISTORY — DX: Nonrheumatic aortic (valve) stenosis: I35.0

## 2023-06-28 HISTORY — DX: Personal history of urinary calculi: Z87.442

## 2023-06-28 HISTORY — DX: Cardiac murmur, unspecified: R01.1

## 2023-06-28 HISTORY — PX: CYSTOSCOPY/URETEROSCOPY/HOLMIUM LASER/STENT PLACEMENT: SHX6546

## 2023-06-28 HISTORY — DX: Cardiac arrhythmia, unspecified: I49.9

## 2023-06-28 LAB — GLUCOSE, CAPILLARY
Glucose-Capillary: 193 mg/dL — ABNORMAL HIGH (ref 70–99)
Glucose-Capillary: 161 mg/dL — ABNORMAL HIGH (ref 70–99)

## 2023-06-28 SURGERY — CYSTOSCOPY/URETEROSCOPY/HOLMIUM LASER/STENT PLACEMENT
Anesthesia: General | Laterality: Right

## 2023-06-28 MED ORDER — MIDAZOLAM HCL 2 MG/2ML IJ SOLN: 0.5000 mg | Freq: Once | INTRAMUSCULAR | Status: DC | PRN

## 2023-06-28 MED ORDER — CEPHALEXIN 500 MG PO CAPS: 500.0000 mg | ORAL_CAPSULE | Freq: Two times a day (BID) | ORAL | 0 refills | Status: AC

## 2023-06-28 MED ORDER — ONDANSETRON HCL 4 MG/2ML IJ SOLN
INTRAMUSCULAR | Status: DC | PRN
  Administered 2023-06-28: 4 mg via INTRAVENOUS

## 2023-06-28 MED ORDER — INSULIN ASPART 100 UNIT/ML IJ SOLN
0.0000 [IU] | INTRAMUSCULAR | Status: DC | PRN
  Administered 2023-06-28: 2 [IU] via SUBCUTANEOUS
  Filled 2023-06-28: qty 1

## 2023-06-28 MED ORDER — PHENAZOPYRIDINE HCL 200 MG PO TABS: 200.0000 mg | ORAL_TABLET | Freq: Three times a day (TID) | ORAL | 0 refills | Status: DC | PRN

## 2023-06-28 MED ORDER — EPHEDRINE 5 MG/ML INJ
INTRAVENOUS | Status: AC
  Filled 2023-06-28: qty 5

## 2023-06-28 MED ORDER — CHLORHEXIDINE GLUCONATE 0.12 % MT SOLN
15.0000 mL | Freq: Once | OROMUCOSAL | Status: AC
  Administered 2023-06-28: 15 mL via OROMUCOSAL

## 2023-06-28 MED ORDER — LACTATED RINGERS IV SOLN: INTRAVENOUS | Status: DC | PRN

## 2023-06-28 MED ORDER — MEPERIDINE HCL 50 MG/ML IJ SOLN: 6.2500 mg | INTRAMUSCULAR | Status: DC | PRN

## 2023-06-28 MED ORDER — ACETAMINOPHEN 500 MG PO TABS
1000.0000 mg | ORAL_TABLET | Freq: Once | ORAL | Status: AC
  Administered 2023-06-28: 1000 mg via ORAL
  Filled 2023-06-28: qty 2

## 2023-06-28 MED ORDER — DEXAMETHASONE SODIUM PHOSPHATE 10 MG/ML IJ SOLN
INTRAMUSCULAR | Status: DC | PRN
  Administered 2023-06-28: 8 mg via INTRAVENOUS

## 2023-06-28 MED ORDER — FENTANYL CITRATE PF 50 MCG/ML IJ SOSY: 25.0000 ug | PREFILLED_SYRINGE | INTRAMUSCULAR | Status: DC | PRN

## 2023-06-28 MED ORDER — PROPOFOL 10 MG/ML IV BOLUS
INTRAVENOUS | Status: DC | PRN
  Administered 2023-06-28: 20 mg via INTRAVENOUS
  Administered 2023-06-28: 100 mg via INTRAVENOUS

## 2023-06-28 MED ORDER — OXYCODONE HCL 5 MG/5ML PO SOLN: 5.0000 mg | Freq: Once | ORAL | Status: DC | PRN

## 2023-06-28 MED ORDER — DEXAMETHASONE SODIUM PHOSPHATE 10 MG/ML IJ SOLN
INTRAMUSCULAR | Status: AC
  Filled 2023-06-28: qty 1

## 2023-06-28 MED ORDER — LIDOCAINE 2% (20 MG/ML) 5 ML SYRINGE
INTRAMUSCULAR | Status: DC | PRN
  Administered 2023-06-28: 20 mg via INTRAVENOUS

## 2023-06-28 MED ORDER — OXYCODONE HCL 5 MG PO TABS: 5.0000 mg | ORAL_TABLET | Freq: Once | ORAL | Status: DC | PRN

## 2023-06-28 MED ORDER — FENTANYL CITRATE (PF) 100 MCG/2ML IJ SOLN
INTRAMUSCULAR | Status: DC | PRN
  Administered 2023-06-28: 25 ug via INTRAVENOUS
  Administered 2023-06-28: 50 ug via INTRAVENOUS
  Administered 2023-06-28: 25 ug via INTRAVENOUS

## 2023-06-28 MED ORDER — ORAL CARE MOUTH RINSE: 15.0000 mL | Freq: Once | OROMUCOSAL | Status: AC

## 2023-06-28 MED ORDER — FENTANYL CITRATE (PF) 100 MCG/2ML IJ SOLN
INTRAMUSCULAR | Status: AC
  Filled 2023-06-28: qty 2

## 2023-06-28 MED ORDER — PROPOFOL 10 MG/ML IV BOLUS
INTRAVENOUS | Status: AC
  Filled 2023-06-28: qty 20

## 2023-06-28 MED ORDER — EPHEDRINE SULFATE-NACL 50-0.9 MG/10ML-% IV SOSY
PREFILLED_SYRINGE | INTRAVENOUS | Status: DC | PRN
  Administered 2023-06-28 (×2): 10 mg via INTRAVENOUS

## 2023-06-28 MED ORDER — CEFAZOLIN SODIUM-DEXTROSE 2-4 GM/100ML-% IV SOLN
2.0000 g | INTRAVENOUS | Status: AC
  Administered 2023-06-28: 2 g via INTRAVENOUS
  Filled 2023-06-28: qty 100

## 2023-06-28 MED ORDER — ONDANSETRON HCL 4 MG/2ML IJ SOLN
INTRAMUSCULAR | Status: AC
  Filled 2023-06-28: qty 2

## 2023-06-28 MED ORDER — LIDOCAINE HCL (PF) 2 % IJ SOLN
INTRAMUSCULAR | Status: AC
  Filled 2023-06-28: qty 5

## 2023-06-28 MED ORDER — SODIUM CHLORIDE 0.9 % IR SOLN
Status: DC | PRN
  Administered 2023-06-28: 3000 mL

## 2023-06-28 MED ORDER — TRAMADOL HCL 50 MG PO TABS: 50.0000 mg | ORAL_TABLET | Freq: Four times a day (QID) | ORAL | 0 refills | Status: AC | PRN

## 2023-06-28 SURGICAL SUPPLY — 21 items
BAG URO CATCHER STRL LF (MISCELLANEOUS) ×1 IMPLANT
BASKET ZERO TIP NITINOL 2.4FR (BASKET) IMPLANT
CATH URETL OPEN 5X70 (CATHETERS) ×1 IMPLANT
CLOTH BEACON ORANGE TIMEOUT ST (SAFETY) ×1 IMPLANT
EXTRACTOR STONE NITINOL NGAGE (UROLOGICAL SUPPLIES) IMPLANT
FIBER LASER MOSES 200 DFL (Laser) IMPLANT
FIBER LASER MOSES 365 DFL (Laser) IMPLANT
GLOVE SURG LX STRL 8.0 MICRO (GLOVE) ×1 IMPLANT
GOWN STRL SURGICAL XL XLNG (GOWN DISPOSABLE) ×1 IMPLANT
GUIDEWIRE ANG ZIPWIRE 035X150 (WIRE) IMPLANT
GUIDEWIRE STR DUAL SENSOR (WIRE) IMPLANT
GUIDEWIRE ZIPWRE .038 STRAIGHT (WIRE) ×1 IMPLANT
KIT TURNOVER KIT A (KITS) IMPLANT
MANIFOLD NEPTUNE II (INSTRUMENTS) ×1 IMPLANT
PACK CYSTO (CUSTOM PROCEDURE TRAY) ×1 IMPLANT
SHEATH NAVIGATOR HD 11/13X36 (SHEATH) IMPLANT
STENT URET 6FRX24 CONTOUR (STENTS) IMPLANT
STENT URET 6FRX26 CONTOUR (STENTS) IMPLANT
TRACTIP FLEXIVA PULSE ID 200 (Laser) IMPLANT
TUBING CONNECTING 10 (TUBING) ×1 IMPLANT
TUBING UROLOGY SET (TUBING) ×1 IMPLANT

## 2023-06-28 NOTE — Transfer of Care (Signed)
 Immediate Anesthesia Transfer of Care Note  Patient: [name]  Procedure(s) Performed: CYSTOSCOPY/URETEROSCOPY/HOLMIUM LASER/STENT PLACEMENT (Right)  Patient Location: PACU  Anesthesia Type:General  Level of Consciousness: awake and alert   Airway & Oxygen Therapy: Patient Spontanous Breathing and Patient connected to face mask oxygen  Post-op Assessment: Report given to RN and Post -op Vital signs reviewed and stable  Post vital signs: Reviewed and stable  Last Vitals:  Vitals Value Taken Time  BP 198/67 06/28/23 1127  Temp    Pulse 44 06/28/23 1130  Resp 11 06/28/23 1130  SpO2 100 % 06/28/23 1130  Vitals shown include unfiled device data.  Last Pain:  Vitals:   06/28/23 0852  TempSrc:   PainSc: 0-No pain         Complications: No notable events documented.

## 2023-06-28 NOTE — Anesthesia Postprocedure Evaluation (Signed)
 Anesthesia Post Note  Patient: Scientist, physiological  Procedure(s) Performed: CYSTOSCOPY/URETEROSCOPY/HOLMIUM LASER/STENT PLACEMENT (Right)     Patient location during evaluation: PACU Anesthesia Type: General Level of consciousness: awake and alert, patient cooperative and oriented Pain management: pain level controlled Vital Signs Assessment: post-procedure vital signs reviewed and stable Respiratory status: spontaneous breathing, nonlabored ventilation and respiratory function stable Cardiovascular status: blood pressure returned to baseline and stable Postop Assessment: no apparent nausea or vomiting Anesthetic complications: no   No notable events documented.  Last Vitals:  Vitals:   06/28/23 1230 06/28/23 1245  BP: (!) 205/65 (!) 156/66  Pulse: (!) 47 (!) 45  Resp: 11 14  Temp:    SpO2: 95% 97%    Last Pain:  Vitals:   06/28/23 1245  TempSrc:   PainSc: 0-No pain                 Temia Debroux,E. Delorse Shane

## 2023-06-28 NOTE — Op Note (Signed)
 Operative Note  Preoperative diagnosis:  1.  6 mm right UPJ and 11 mm right renal stones  Postoperative diagnosis: 1.  Same  Procedure(s): 1.  Cystoscopy with right ureteroscopy, holmium laser lithotripsy and right JJ stent exchange  Surgeon: Rhoderick Moody, MD  Assistants:  None  Anesthesia:  General  Complications:  None  EBL: Less than 5 mL  Specimens: 1.  Previously placed right ureteral stent was removed intact, inspected and discarded 2.  Right renal stone fragments  Drains/Catheters: 1.  Right 6 French, 24 cm JJ stent without tether  Intraoperative findings:   Partially obstructing 6 mm right UPJ stone migrated into the renal pelvis during laser lithotripsy.  Combined 6 mm and 11 mm stone burden created significant stone debris No intravesical or urethral abnormalities were seen  Indication:  [name] is a 78 y.o. female with a history of an obstructing 6 mm right UPJ stone as well as an 11 mm right renal stone that required urgent stent placement on 06/18/2023 due to renal colic and concern for urosepsis.  Her subsequent urine culture was negative.  She is here today for definitive stone treatment.  She has been consented for the above procedures, voices understanding and wishes to proceed.  Description of procedure:  After informed consent was obtained, the patient was brought to the operating room and general LMA anesthesia was administered. The patient was then placed in the dorsolithotomy position and prepped and draped in the usual sterile fashion. A timeout was performed. A 23 French rigid cystoscope was then inserted into the urethral meatus and advanced into the bladder under direct vision. A complete bladder survey revealed no intravesical pathology.  Her previously placed right ureteral stent was then grasped its distal curl and retracted to the urethral meatus.  A Glidewire was then advanced through the lumen of the stent and up to the right renal  pelvis, or fluoroscopic guidance.  The previously placed stent was then removed intact, inspected and discarded.  An additional sensor wire was then advanced up the right ureter to the right renal pelvis, or fluoroscopic guidance.  A 14 French, 35 cm ureteral access sheath was then advanced over the sensor wire and into good position within the proximal aspects of the right ureter, confirming placement via fluoroscopy.  A flexible ureteroscope was then advanced through the lumen of the sheath and into the proximal ureter where her obstructing stone was identified.  A 200 m holmium laser was then used to fracture the stone into numerous smaller pieces that then migrated into the renal pelvis.  Inspection of the renal pelvis revealed a large 11 mm renal pelvis stone.  All stone burden was then fractured into innumerable smaller pieces.  The largest fragments were then extracted from the renal pelvis using an engage basket.  The remaining stone fragments were less than or equal to 2 mm in size.  The flexible ureteroscope and ureteral access sheath were then removed under direct vision, identifying no evidence of ureteral trauma or luminal stone burden.  A 6 French, 24 cm JJ stent was then advanced over the Glidewire and into good position within the right collecting system, confirming placement via fluoroscopy.  The patient's bladder was drained.  She tolerated the procedure well and was transferred to the postanesthesia in stable condition.  Plan: Follow-up in 1 week for KUB and possible right ureteral stent removal

## 2023-06-28 NOTE — H&P (Signed)
 Urology Preoperative H&P   Chief Complaint: Right ureteral and renal stones   History of Present Illness: [name] is a 78 y.o. female with an obstructing right UPJ stone along with a non-obstructing right renal stone and is s/p right ureteral stent placement on 06/18/23 due to renal colic and suspected sepsis.  Urine cx from her previous admission grew <10,000 CFU of insignificant growth.  She notes right lower quadrant discomfort and intermittent hematuria w/o clots, but denies dysuria, he or interval fever/chills.    Past Medical History:  Diagnosis Date   A-fib (HCC)    Actinic keratosis    Followed by Dr. Terri Piedra   Anemia    Anxiety    Aortic stenosis    Diabetes mellitus, type II, insulin dependent (HCC)    Associated with obesity: She takes Lantus, Victoza & Actos   Diplopia    Dysrhythmia    Afib Loop recorder in place   Essential hypertension    GERD (gastroesophageal reflux disease)    Heart murmur    History of kidney stones    Hyperlipidemia    Morbid obesity with BMI of 40.0-44.9, adult (HCC)    Stroke (HCC) 2021   x2    Past Surgical History:  Procedure Laterality Date   BREAST BIOPSY  1997   CARPAL TUNNEL RELEASE Right 11/12/2021   Procedure: CARPAL TUNNEL RELEASE;  Surgeon: Oliver Barre, MD;  Location: [facility];  Service: Orthopedics;  Laterality: Right;  pt knows to arrive at 6:15   CYSTOSCOPY W/ URETERAL STENT PLACEMENT Right 06/18/2023   Procedure: CYSTOSCOPY, WITH RETROGRADE PYELOGRAM AND URETERAL STENT INSERTION;  Surgeon: Rene Paci, MD;  Location[facility];  Service: Urology;  Laterality: Right;   LEFT HEART CATH AND CORONARY ANGIOGRAPHY N/A 01/05/2017   Procedure: LEFT HEART CATH AND CORONARY ANGIOGRAPHY;  Surgeon: Marykay Lex, MD;  Location: [facility] INVASIVE CV LAB;  Service: Cardiovascular: Angiographically normal coronary arteries with a right dominant system.  Normal EF 55-65%.   LOOP RECORDER INSERTION N/A 08/01/2019   Procedure:  LOOP RECORDER INSERTION;  Surgeon: Regan Lemming, MD;  Location: [facility] INVASIVE CV LAB;  Service: Cardiovascular;  Laterality: N/A;    Allergies:  Allergies  Allergen Reactions   Tape Other (See Comments)    SKIN IS VERY THIN!!    Livalo [Pitavastatin] Other (See Comments)    MYALGIAS    Sulfa Antibiotics Itching and Other (See Comments)    "Red spots," also    Family History  Problem Relation Age of Onset   Diabetes Mother    Hyperlipidemia Mother    Stroke Mother 37   Liver cancer Father    Hypertension Sister    Hyperlipidemia Sister    Diabetes Sister        On insulin   Depression Sister    Kidney disease Brother        After dialysis he had renal transplant   Diabetes Brother    Coronary artery disease Brother    Peripheral Artery Disease Brother    Stroke Brother 80   Bladder Cancer Brother    Diabetes Brother     Social History:  reports that she has never smoked. She has never used smokeless tobacco. She reports that she does not drink alcohol and does not use drugs.  ROS: A complete review of systems was performed.  All systems are negative except for pertinent findings as noted.  Physical Exam:  Vital signs in last 24 hours: Temp:  [  98.3 F (36.8 C)] 98.3 F (36.8 C) (04/01 0731) Pulse Rate:  [58] 58 (04/01 0731) Resp:  [16] 16 (04/01 0731) BP: (206-227)/(68-83) 206/68 (04/01 0735) SpO2:  [96 %] 96 % (04/01 0731) Constitutional:  Alert and oriented, No acute distress Cardiovascular: Regular rate and rhythm, No JVD Respiratory: Normal respiratory effort, Lungs clear bilaterally GI: Abdomen is soft, nontender, nondistended, no abdominal masses GU: No CVA tenderness Lymphatic: No lymphadenopathy Neurologic: Grossly intact, no focal deficits Psychiatric: Normal mood and affect  Laboratory Data:  No results for input(s): "WBC", "HGB", "HCT", "PLT" in the last 72 hours.  No results for input(s): "NA", "K", "CL", "GLUCOSE", "BUN", "CALCIUM",  "CREATININE" in the last 72 hours.  Invalid input(s): "CO3"   Results for orders placed or performed during the hospital encounter of 06/28/23 (from the past 24 hours)  Glucose, capillary     Status: Abnormal   Collection Time: 06/28/23  7:40 AM  Result Value Ref Range   Glucose-Capillary 193 (H) 70 - 99 mg/dL   Comment 1 Notify RN    Comment 2 Document in Chart    Recent Results (from the past 240 hours)  Urine Culture (for pregnant, neutropenic or urologic patients or patients with an indwelling urinary catheter)     Status: Abnormal   Collection Time: 06/19/23  4:40 AM   Specimen: Urine, Clean Catch  Result Value Ref Range Status   Specimen Description   Final    URINE, CLEAN CATCH Performed at [facility], [address], [place], [place] [address]    Special Requests   Final    NONE Performed at [facility], [address] [place], [place] [address]    Culture (A)  Final    <10,000 COLONIES/mL INSIGNIFICANT GROWTH Performed at [facility], [address] [place], [place] [address]    Report Status 06/20/2023 FINAL  Final    Renal Function: Recent Labs    06/22/23 0429  CREATININE 0.88   Estimated Creatinine Clearance: 56.1 mL/min (by C-G formula based on SCr of 0.88 mg/dL).  Radiologic Imaging: CT ABDOMEN PELVIS W CONTRAST Result Date: 06/18/2023 CLINICAL DATA:  Acute right-sided abdominal and flank pain. Nausea and vomiting. EXAM: CT ABDOMEN AND PELVIS WITH CONTRAST TECHNIQUE: Multidetector CT imaging of the abdomen and pelvis was performed using the standard protocol following bolus administration of intravenous contrast. RADIATION DOSE REDUCTION: This exam was performed according to the departmental dose-optimization program which includes automated exposure control, adjustment of the mA and/or kV according to patient size and/or use of iterative reconstruction technique. CONTRAST:  OMNIPAQUE IOHEXOL 300 MG/ML   SOLN COMPARISON:  None Available. FINDINGS: Lower Chest: Moderate cardiomegaly and tiny pericardial effusion. Tiny bilateral pleural effusions also noted. Hepatobiliary: No suspicious hepatic masses identified. Tiny gallstones noted, without signs of cholecystitis or biliary ductal dilatation. Pancreas:  No mass or inflammatory changes. Spleen: Within normal limits in size and appearance. Adrenals/Urinary Tract: No suspicious masses identified. Mild to moderate right hydronephrosis and perinephric stranding is seen due to a 6 mm calculus at the right UPJ. A 10 mm nonobstructing calculus is also seen in the right renal pelvis. Stomach/Bowel: Small hiatal hernia noted. No evidence of obstruction, inflammatory process or abnormal fluid collections. Vascular/Lymphatic: No pathologically enlarged lymph nodes. No acute vascular findings. Reproductive:  No mass or other significant abnormality. Other:  None. Musculoskeletal:  No suspicious bone lesions identified. IMPRESSION: Mild to moderate right hydronephrosis and perinephric stranding due to 6 mm calculus  at the right UPJ. 10 mm nonobstructing calculus in right renal pelvis. Cholelithiasis. No radiographic evidence of cholecystitis. Small hiatal hernia. Electronically Signed   By: Danae Orleans M.D.   On: 06/18/2023 12:08   I independently reviewed the above imaging studies.  Assessment and Plan Lorain Fettes is a 78 y.o. female with right ureteral and renal stones   The risks, benefits and alternatives of cystoscopy with RIGHT ureteroscopy, laser lithotripsy and ureteral stent placement was discussed the patient.  Risks included, but are not limited to: bleeding, urinary tract infection, ureteral injury/avulsion, ureteral stricture formation, retained stone fragments, the possibility that multiple surgeries may be required to treat the stone(s), MI, stroke, PE and the inherent risks of general anesthesia.  The patient voices understanding and wishes to  proceed.      Rhoderick Moody, MD 06/28/2023, 7:51 AM  [facility] Pager: [phone]

## 2023-06-28 NOTE — Anesthesia Procedure Notes (Signed)
 Procedure Name: LMA Insertion Date/Time: 06/28/2023 9:50 AM  Performed by: Ovidio Kin, CRNAPre-anesthesia Checklist: Patient identified, Emergency Drugs available, Suction available and Patient being monitored Patient Re-evaluated:Patient Re-evaluated prior to induction Preoxygenation: Pre-oxygenation with 100% oxygen Induction Type: IV induction Ventilation: Mask ventilation without difficulty LMA Size: 4.0 Number of attempts: 1 Dental Injury: Teeth and Oropharynx as per pre-operative assessment

## 2023-06-29 ENCOUNTER — Encounter (HOSPITAL_COMMUNITY): Payer: Self-pay | Admitting: Urology

## 2023-06-29 NOTE — Progress Notes (Signed)
 [facility] [address] [place] [address]    Clinic Day:  06/30/2023  Referring physician: Joycelyn Rua, MD  Patient Care Team: Kerry Rua, MD as PCP - General (Family Medicine) Kerry Lex, MD as PCP - Cardiology (Cardiology) Kerry Lemming, MD as PCP - Electrophysiology (Cardiology) [name], OD as Referring Physician (Optometry)   ASSESSMENT & PLAN:   Assessment: 1.  Mild thrombocytopenia and normocytic anemia: - Mild thrombocytopenia since October 2024, anemia since August 2023 - Denies any B symptoms or liver disease.  Colonoscopy x 2, last exam at age 20 - Spleen ultrasound (01/17/2023): Normal size with spleen volume 155 cc - Serum EPO-28.5 - NGS myeloid panel: DNMT3A - BMBX (06/13/2023): Hypercellular marrow for age with trilineage hematopoiesis and nonspecific changes possibly secondary in nature.  No evidence of lymphoproliferative/plasma cell neoplasm. - Cytogenetics: 46, XX[20] - MDS FISH panel: Normal   2. Social/Family History: -She lives with daughter at home. Independent of ADL's and iADL's. No tobacco use. No chemical exposures.  -No family history of anemia or blood disorders. Sister had breast cancer. Father had hepatic cancer.     Plan: 1.  Mild thrombocytopenia: - This has been stable since October 2024.  No bleeding issues. - She is on Eliquis for paroxysmal atrial fibrillation. - We reviewed her bone marrow biopsy results which showed abundant megakaryocytes with scattered large forms.  No dysplasia was seen.   2.  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 - We discussed bone marrow biopsy results which did not show any significant pathology.  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 - She received INFeD on 02/01/2023 and Venofer 100 mg when she was hospitalized on 06/21/2023.  Latest hemoglobin was 9.8.  Last ferritin was 210 and percent  saturation of 6.  B12 was normal. - She was hospitalized from 06/18/2023 through 06/22/2023 with UTI and nephrolithiasis. - I would recommend follow-up in 3 months with repeat CBC, ferritin, iron panel, MMA, copper, B12 and folic acid.  We will give her intermittent iron infusions.  3.  B12 deficiency: - Continue B12 tablet 1 mg daily.  Latest B12 and MMA levels improved to normal.    Orders Placed This Encounter  Procedures   CBC with Differential    Standing Status:   Future    Expected Date:   09/26/2023    Expiration Date:   06/29/2024   Lactate dehydrogenase    Standing Status:   Future    Expected Date:   09/26/2023    Expiration Date:   06/29/2024   Ferritin    Standing Status:   Future    Expected Date:   09/26/2023    Expiration Date:   06/29/2024   Vitamin B12    Standing Status:   Future    Expected Date:   09/26/2023    Expiration Date:   06/29/2024   Iron and TIBC ([facility] ONLY)    Standing Status:   Future    Expected Date:   09/26/2023    Expiration Date:   06/29/2024   Folate    Standing Status:   Future    Expected Date:   09/26/2023    Expiration Date:   06/29/2024   Methylmalonic acid, serum    Standing Status:   Future    Expected Date:   09/26/2023    Expiration Date:   06/29/2024   Copper,  serum    Standing Status:   Future    Expected Date:   09/26/2023    Expiration Date:   06/29/2024      [name],acting as a scribe for Kerry Massed, MD.,have documented all relevant documentation on the behalf of Kerry Massed, MD,as directed by  Kerry Massed, MD while in the presence of Kerry Massed, MD.  I, Kerry Massed MD, have reviewed the above documentation for accuracy and completeness, and I agree with the above.    Kerry Massed, MD   4/3/20254:12 PM  CHIEF COMPLAINT:   Diagnosis:  Mild thrombocytopenia and normocytic anemia    Cancer Staging  No matching staging information was found for the  patient.    Prior Therapy: none  Current Therapy: INFeD and B12   HISTORY OF PRESENT ILLNESS:   Oncology History   No history exists.     INTERVAL HISTORY:   [name] is a 78 y.o. female presenting to clinic today for follow up of Mild thrombocytopenia and normocytic anemia. She was last seen by me on 01/24/23 and Kerry Brenner PA on 05/23/23.  Since her last visit, she has been admitted to the hospital from 06/18/23 to 06/22/23 for nephrolithiasis with UTI with a right uretal stent placed on 3/22. She underwent a cytoscopy/ureteroscopy/right uretal stent placement on 06/28/23 under Dr. Liliane Johnson. Fish results were normal, as was cytogenetics.   [name] had bone marrow biopsy on 06/13/23. The bone marrow shoed: hypercellular bone marrow for age with trilineage hematopoiesis. Pathology found: no significant CD34 positive blastic population identified and no monoclonal B-cell population or significant T-cell abnormalities identified.   Today, she states that she is doing well overall. Her appetite level is at 100%. Her energy level is at 70%.  PAST MEDICAL HISTORY:   Past Medical History: Past Medical History:  Diagnosis Date   A-fib (HCC)    Actinic keratosis    Followed by Dr. Terri Piedra   Anemia    Anxiety    Aortic stenosis    Diabetes mellitus, type II, insulin dependent (HCC)    Associated with obesity: She takes Lantus, Victoza & Actos   Diplopia    Dysrhythmia    Afib Loop recorder in place   Essential hypertension    GERD (gastroesophageal reflux disease)    Heart murmur    History of kidney stones    Hyperlipidemia    Morbid obesity with BMI of 40.0-44.9, adult (HCC)    Stroke (HCC) 2021   x2    Surgical History: Past Surgical History:  Procedure Laterality Date   BREAST BIOPSY  1997   CARPAL TUNNEL RELEASE Right 11/12/2021   Procedure: CARPAL TUNNEL RELEASE;  Surgeon: Oliver Barre, MD;  Location: [facility];  Service: Orthopedics;  Laterality: Right;  pt knows to  arrive at 6:15   CYSTOSCOPY W/ URETERAL STENT PLACEMENT Right 06/18/2023   Procedure: CYSTOSCOPY, WITH RETROGRADE PYELOGRAM AND URETERAL STENT INSERTION;  Surgeon: Rene Paci, MD;  Location: [facility];  Service: Urology;  Laterality: Right;   CYSTOSCOPY/URETEROSCOPY/HOLMIUM LASER/STENT PLACEMENT Right 06/28/2023   Procedure: CYSTOSCOPY/URETEROSCOPY/HOLMIUM LASER/STENT PLACEMENT;  Surgeon: Rene Paci, MD;  Location: [facility];  Service: Urology;  Laterality: Right;  CYSTOSCOPY/RIGHT URETEROSCOPY/HOLMIUM LASER LITHOTRIPSY/STENT PLACEMENT   LEFT HEART CATH AND CORONARY ANGIOGRAPHY N/A 01/05/2017   Procedure: LEFT HEART CATH AND CORONARY ANGIOGRAPHY;  Surgeon: Kerry Lex, MD;  Location[facility] INVASIVE CV LAB;  Service: Cardiovascular: Angiographically normal coronary arteries with a right dominant system.  Normal EF  55-65%.   LOOP RECORDER INSERTION N/A 08/01/2019   Procedure: LOOP RECORDER INSERTION;  Surgeon: Kerry Lemming, MD;  Location: [facility] INVASIVE CV LAB;  Service: Cardiovascular;  Laterality: N/A;    Social History: Social History   Socioeconomic History   Marital status: Widowed    Spouse name: Not on file   Number of children: 3   Years of education: 36   Highest education level: Not on file  Occupational History    Comment: Retired  Tobacco Use   Smoking status: Never   Smokeless tobacco: Never   Tobacco comments:    Never smoke 12/16/21  Vaping Use   Vaping status: Never Used  Substance and Sexual Activity   Alcohol use: No   Drug use: Never   Sexual activity: Not Currently  Other Topics Concern   Not on file  Social History Narrative   She is a widowed mother of 3 with 5 grandchildren. She lives with one of her daughters and grandson. She is a coming by her sister. She tries to walk, but does not do it regularly.   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   Coffee every morning, tea rare   Right handed    Social Drivers of  Health   Financial Resource Strain: Not on file  Food Insecurity: No Food Insecurity (06/18/2023)   Hunger Vital Sign    Worried About Running Out of Food in the Last Year: Never true    Ran Out of Food in the Last Year: Never true  Transportation Needs: No Transportation Needs (06/18/2023)   PRAPARE - Administrator, Civil Service (Medical): No    Lack of Transportation (Non-Medical): No  Physical Activity: Not on file  Stress: Not on file  Social Connections: Moderately Integrated (06/18/2023)   Social Connection and Isolation Panel [NHANES]    Frequency of Communication with Friends and Family: Three times a week    Frequency of Social Gatherings with Friends and Family: Once a week    Attends Religious Services: 1 to 4 times per year    Active Member of [employer] or Organizations: No    Attends Banker Meetings: 1 to 4 times per year    Marital Status: Widowed  Intimate Partner Violence: Not At Risk (06/18/2023)   Humiliation, Afraid, Rape, and Kick questionnaire    Fear of Current or Ex-Partner: No    Emotionally Abused: No    Physically Abused: No    Sexually Abused: No    Family History: Family History  Problem Relation Age of Onset   Diabetes Mother    Hyperlipidemia Mother    Stroke Mother 26   Liver cancer Father    Hypertension Sister    Hyperlipidemia Sister    Diabetes Sister        On insulin   Depression Sister    Kidney disease Brother        After dialysis he had renal transplant   Diabetes Brother    Coronary artery disease Brother    Peripheral Artery Disease Brother    Stroke Brother 68   Bladder Cancer Brother    Diabetes Brother     Current Medications:  Current Outpatient Medications:    acetaminophen (TYLENOL) 650 MG CR tablet, Take 650-1,300 mg by mouth every 8 (eight) hours as needed for pain., Disp: , Rfl:    alendronate (FOSAMAX) 70 MG tablet, Take 70 mg by mouth See admin instructions. Take 70  mg by mouth at bedtime  every Saturday. Take with a full glass of water on an empty stomach., Disp: , Rfl:    ASTEPRO 205.5 MCG/SPRAY SOLN, Place 1-2 sprays into both nostrils every 12 (twelve) hours as needed (for allergies or rhinitis)., Disp: , Rfl:    atorvastatin (LIPITOR) 40 MG tablet, Take 1 tablet (40 mg total) by mouth daily. (Patient taking differently: Take 40 mg by mouth See admin instructions. Take 40 mg by mouth every other night at bedtime), Disp: 30 tablet, Rfl: 2   blood glucose meter kit and supplies KIT, Dispense based on patient and insurance preference. Use up to four times daily as directed. (FOR ICD-9 250.00, 250.01)., Disp: 1 each, Rfl: 0   carvedilol (COREG) 12.5 MG tablet, Take 1 tablet (12.5 mg total) by mouth 2 (two) times daily., Disp: 180 tablet, Rfl: 2   cefdinir (OMNICEF) 300 MG capsule, Take 1 capsule (300 mg total) by mouth 2 (two) times daily for 10 days., Disp: 20 capsule, Rfl: 0   cephALEXin (KEFLEX) 500 MG capsule, Take 1 capsule (500 mg total) by mouth 2 (two) times daily for 3 days., Disp: 6 capsule, Rfl: 0   flecainide (TAMBOCOR) 50 MG tablet, Take 1 tablet by mouth twice daily, Disp: 180 tablet, Rfl: 1   furosemide (LASIX) 40 MG tablet, Take 1 tablet (40 mg total) by mouth daily. (Patient taking differently: Take 80 mg by mouth daily.), Disp: 90 tablet, Rfl: 3   Insulin Syringe-Needle U-100 (RELION INSULIN SYR .3CC/29G) 29G X 1/2" 0.3 ML MISC, 1 each by Does not apply route in the morning and at bedtime., Disp: 100 each, Rfl: 0   Lancets (ONETOUCH DELICA PLUS LANCET33G) MISC, USE 1 LANCET TO CHECK GLUCOSE UP TO 4 TIMES DAILY, Disp: , Rfl:    lisinopril-hydrochlorothiazide (ZESTORETIC) 20-25 MG tablet, Take 1 tablet by mouth at bedtime., Disp: , Rfl:    nitroGLYCERIN (NITROSTAT) 0.4 MG SL tablet, Place 1 tablet (0.4 mg total) under the tongue every 5 (five) minutes as needed for chest pain., Disp: 25 tablet, Rfl: 5   ONETOUCH VERIO test strip, USE 1 STRIP TO CHECK GLUCOSE UP TO 4 TIMES  DAILY AS DIRECTED, Disp: , Rfl:    phenazopyridine (PYRIDIUM) 200 MG tablet, Take 1 tablet (200 mg total) by mouth 3 (three) times daily as needed (for pain with urination)., Disp: 30 tablet, Rfl: 0   RELION PEN NEEDLES 31G X 6 MM MISC, USE 1 IN THE MORNING AND AT BEDTIME, Disp: , Rfl:    traMADol (ULTRAM) 50 MG tablet, Take 1 tablet (50 mg total) by mouth every 6 (six) hours as needed for up to 5 days., Disp: 20 tablet, Rfl: 0   Allergies: Allergies  Allergen Reactions   Tape Other (See Comments)    SKIN IS VERY THIN!!    Livalo [Pitavastatin] Other (See Comments)    MYALGIAS    Sulfa Antibiotics Itching and Other (See Comments)    "Red spots," also    REVIEW OF SYSTEMS:   Review of Systems  Constitutional:  Negative for chills, fatigue and fever.  HENT:   Negative for lump/mass, mouth sores, nosebleeds, sore throat and trouble swallowing.   Eyes:  Negative for eye problems.  Respiratory:  Negative for cough and shortness of breath.   Cardiovascular:  Negative for chest pain, leg swelling and palpitations.  Gastrointestinal:  Negative for abdominal pain, constipation, diarrhea, nausea and vomiting.  Genitourinary:  Negative for bladder incontinence, difficulty urinating, dysuria, frequency, hematuria  and nocturia.   Musculoskeletal:  Negative for arthralgias, back pain, flank pain, myalgias and neck pain.  Skin:  Negative for itching and rash.  Neurological:  Negative for dizziness, headaches and numbness.  Hematological:  Does not bruise/bleed easily.  Psychiatric/Behavioral:  Negative for depression, sleep disturbance and suicidal ideas. The patient is not nervous/anxious.   All other systems reviewed and are negative.    VITALS:   Blood pressure (!) 184/56, pulse (!) 50, temperature 97.7 F (36.5 C), temperature source Oral, resp. rate 16, weight 228 lb 2.8 oz (103.5 kg), SpO2 97%.  Wt Readings from Last 3 Encounters:  06/30/23 228 lb 2.8 oz (103.5 kg)  06/28/23 214 lb  (97.1 kg)  06/18/23 214 lb 15.2 oz (97.5 kg)    Body mass index is 43.11 kg/m.  Performance status (ECOG): 1 - Symptomatic but completely ambulatory  PHYSICAL EXAM:   Physical Exam Vitals and nursing note reviewed. Exam conducted with a chaperone present.  Constitutional:      Appearance: Normal appearance.  Cardiovascular:     Rate and Rhythm: Normal rate and regular rhythm.     Pulses: Normal pulses.     Heart sounds: Normal heart sounds.  Pulmonary:     Effort: Pulmonary effort is normal.     Breath sounds: Normal breath sounds.  Abdominal:     Palpations: Abdomen is soft. There is no hepatomegaly, splenomegaly or mass.     Tenderness: There is no abdominal tenderness.  Musculoskeletal:     Right lower leg: No edema.     Left lower leg: No edema.  Lymphadenopathy:     Cervical: No cervical adenopathy.     Right cervical: No superficial, deep or posterior cervical adenopathy.    Left cervical: No superficial, deep or posterior cervical adenopathy.     Upper Body:     Right upper body: No supraclavicular or axillary adenopathy.     Left upper body: No supraclavicular or axillary adenopathy.  Neurological:     General: No focal deficit present.     Mental Status: She is alert and oriented to person, place, and time.  Psychiatric:        Mood and Affect: Mood normal.        Behavior: Behavior normal.     LABS:      Latest Ref Rng & Units 06/22/2023    4:29 AM 06/21/2023    4:07 AM 06/20/2023    3:41 AM  CBC  WBC 4.0 - 10.5 K/uL 6.9  6.8  11.3   Hemoglobin 12.0 - 15.0 g/dL 9.8  8.8  9.7   Hematocrit 36.0 - 46.0 % 31.6  28.5  31.6   Platelets 150 - 400 K/uL 116  94  108       Latest Ref Rng & Units 06/22/2023    4:29 AM 06/21/2023    4:07 AM 06/20/2023    3:41 AM  CMP  Glucose 70 - 99 mg/dL 48  161  92   BUN 8 - 23 mg/dL 33  43  53   Creatinine 0.44 - 1.00 mg/dL 0.96  0.45  4.09   Sodium 135 - 145 mmol/L 139  135  137   Potassium 3.5 - 5.1 mmol/L 3.7  3.9  4.1    Chloride 98 - 111 mmol/L 106  104  104   CO2 22 - 32 mmol/L 27  24  24    Calcium 8.9 - 10.3 mg/dL 8.8  8.2  8.7  No results found for: "CEA1", "CEA" / No results found for: "CEA1", "CEA" No results found for: "PSA1" No results found for: "CAN199" No results found for: "CAN125"  Lab Results  Component Value Date   TOTALPROTELP 6.6 01/04/2023   TOTALPROTELP 6.4 01/04/2023   ALBUMINELP 3.5 01/04/2023   A1GS 0.3 01/04/2023   A2GS 0.7 01/04/2023   BETS 0.9 01/04/2023   GAMS 1.0 01/04/2023   MSPIKE Not Observed 01/04/2023   SPEI Comment 01/04/2023   Lab Results  Component Value Date   TIBC 281 06/18/2023   TIBC 320 05/23/2023   TIBC 365 04/04/2023   FERRITIN 210 06/18/2023   FERRITIN 212 05/23/2023   FERRITIN 106 04/04/2023   IRONPCTSAT 6 (L) 06/18/2023   IRONPCTSAT 16 05/23/2023   IRONPCTSAT 12 04/04/2023   Lab Results  Component Value Date   LDH 224 (H) 01/04/2023     STUDIES:   DG C-Arm 1-60 Min-No Report Result Date: 06/28/2023 Fluoroscopy was utilized by the requesting physician.  No radiographic interpretation.   DG C-Arm 1-60 Min-No Report Result Date: 06/28/2023 Fluoroscopy was utilized by the requesting physician.  No radiographic interpretation.   DG C-Arm 1-60 Min-No Report Result Date: 06/18/2023 Fluoroscopy was utilized by the requesting physician.  No radiographic interpretation.   CT ABDOMEN PELVIS W CONTRAST Result Date: 06/18/2023 CLINICAL DATA:  Acute right-sided abdominal and flank pain. Nausea and vomiting. EXAM: CT ABDOMEN AND PELVIS WITH CONTRAST TECHNIQUE: Multidetector CT imaging of the abdomen and pelvis was performed using the standard protocol following bolus administration of intravenous contrast. RADIATION DOSE REDUCTION: This exam was performed according to the departmental dose-optimization program which includes automated exposure control, adjustment of the mA and/or kV according to patient size and/or use of iterative reconstruction  technique. CONTRAST:  OMNIPAQUE IOHEXOL 300 MG/ML  SOLN COMPARISON:  None Available. FINDINGS: Lower Chest: Moderate cardiomegaly and tiny pericardial effusion. Tiny bilateral pleural effusions also noted. Hepatobiliary: No suspicious hepatic masses identified. Tiny gallstones noted, without signs of cholecystitis or biliary ductal dilatation. Pancreas:  No mass or inflammatory changes. Spleen: Within normal limits in size and appearance. Adrenals/Urinary Tract: No suspicious masses identified. Mild to moderate right hydronephrosis and perinephric stranding is seen due to a 6 mm calculus at the right UPJ. A 10 mm nonobstructing calculus is also seen in the right renal pelvis. Stomach/Bowel: Small hiatal hernia noted. No evidence of obstruction, inflammatory process or abnormal fluid collections. Vascular/Lymphatic: No pathologically enlarged lymph nodes. No acute vascular findings. Reproductive:  No mass or other significant abnormality. Other:  None. Musculoskeletal:  No suspicious bone lesions identified. IMPRESSION: Mild to moderate right hydronephrosis and perinephric stranding due to 6 mm calculus at the right UPJ. 10 mm nonobstructing calculus in right renal pelvis. Cholelithiasis. No radiographic evidence of cholecystitis. Small hiatal hernia. Electronically Signed   By: Danae Orleans M.D.   On: 06/18/2023 12:08   CT BONE MARROW BIOPSY & ASPIRATION Result Date: 06/13/2023 INDICATION: Pancytopenia, macrocytosis EXAM: CT GUIDED BONE MARROW ASPIRATION AND CORE BIOPSY Interventional Radiologist:  Sterling Big, MD MEDICATIONS: None. ANESTHESIA/SEDATION: Moderate (conscious) sedation was employed during this procedure. A total of 1.5 milligrams versed and 50 micrograms fentanyl were administered intravenously. The patient's level of consciousness and vital signs were monitored continuously by radiology nursing throughout the procedure under my direct supervision. Total monitored sedation time: 10  minutes FLUOROSCOPY: None. COMPLICATIONS: None immediate. Estimated blood loss: <25 mL PROCEDURE: Informed written consent was obtained from the patient after a thorough discussion of the  procedural risks, benefits and alternatives. All questions were addressed. Maximal Sterile Barrier Technique was utilized including caps, mask, sterile gowns, sterile gloves, sterile drape, hand hygiene and skin antiseptic. A timeout was performed prior to the initiation of the procedure. The patient was positioned prone and non-contrast localization CT was performed of the pelvis to demonstrate the iliac marrow spaces. Maximal barrier sterile technique utilized including caps, mask, sterile gowns, sterile gloves, large sterile drape, hand hygiene, and betadine prep. Under sterile conditions and local anesthesia, an 11 gauge coaxial bone biopsy needle was advanced into the right iliac marrow space. Needle position was confirmed with CT imaging. Initially, bone marrow aspiration was performed. Next, the 11 gauge outer cannula was utilized to obtain a right iliac bone marrow core biopsy. Needle was removed. Hemostasis was obtained with compression. The patient tolerated the procedure well. Samples were prepared with the cytotechnologist. IMPRESSION: Successful CT-guided bone marrow aspiration and core biopsy of the right iliac bone. Electronically Signed   By: Malachy Moan M.D.   On: 06/13/2023 10:30

## 2023-06-30 ENCOUNTER — Inpatient Hospital Stay: Attending: Hematology | Admitting: Hematology

## 2023-06-30 VITALS — BP 184/56 | HR 50 | Temp 97.7°F | Resp 16 | Wt 228.2 lb

## 2023-06-30 DIAGNOSIS — Z79899 Other long term (current) drug therapy: Secondary | ICD-10-CM | POA: Insufficient documentation

## 2023-06-30 DIAGNOSIS — I10 Essential (primary) hypertension: Secondary | ICD-10-CM | POA: Diagnosis not present

## 2023-06-30 DIAGNOSIS — Z7901 Long term (current) use of anticoagulants: Secondary | ICD-10-CM | POA: Diagnosis not present

## 2023-06-30 DIAGNOSIS — I480 Paroxysmal atrial fibrillation: Secondary | ICD-10-CM | POA: Insufficient documentation

## 2023-06-30 DIAGNOSIS — D649 Anemia, unspecified: Secondary | ICD-10-CM | POA: Diagnosis not present

## 2023-06-30 DIAGNOSIS — D61818 Other pancytopenia: Secondary | ICD-10-CM

## 2023-06-30 DIAGNOSIS — D509 Iron deficiency anemia, unspecified: Secondary | ICD-10-CM | POA: Insufficient documentation

## 2023-06-30 DIAGNOSIS — E538 Deficiency of other specified B group vitamins: Secondary | ICD-10-CM | POA: Insufficient documentation

## 2023-06-30 DIAGNOSIS — D696 Thrombocytopenia, unspecified: Secondary | ICD-10-CM | POA: Diagnosis not present

## 2023-06-30 DIAGNOSIS — Z09 Encounter for follow-up examination after completed treatment for conditions other than malignant neoplasm: Secondary | ICD-10-CM | POA: Diagnosis not present

## 2023-06-30 NOTE — Progress Notes (Signed)
 Carelink Summary Report / Loop Recorder

## 2023-06-30 NOTE — Patient Instructions (Addendum)
 [facility] at [facility] Discharge Instructions   You were seen and examined today by Dr. Ellin Saba.  He reviewed the results of your lab work. 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 This is what is causing your hemoglobin and platelets to be low.   He reviewed the results of your bone marrow biopsy.   We will see you back in 3 months. We will repeat lab work prior to this visit.   Return as scheduled.    Thank you for choosing [facility] at [facility] to provide your oncology and hematology care.  To afford each patient quality time with our provider, please arrive at least 15 minutes before your scheduled appointment time.   If you have a lab appointment with the Cancer Center please come in thru the Main Entrance and check in at the main information desk.  You need to re-schedule your appointment should you arrive 10 or more minutes late.  We strive to give you quality time with our providers, and arriving late affects you and other patients whose appointments are after yours.  Also, if you no show three or more times for appointments you may be dismissed from the clinic at the providers discretion.     Again, thank you for choosing [facility].  Our hope is that these requests will decrease the amount of time that you wait before being seen by our physicians.       _____________________________________________________________  Should you have questions after your visit to [facility], please contact our office at [phone] and follow the prompts.  Our office hours are 8:00 a.m. and 4:30 p.m. Monday - Friday.  Please note that voicemails left after 4:00 p.m. may not be returned until the following business day.  We are closed weekends and major holidays.  You do have access to a nurse 24-7, just call the main number to the clinic  [phone] and do not press any options, hold on the line and a nurse will answer the phone.    For prescription refill requests, have your pharmacy contact our office and allow 72 hours.    Due to Covid, you will need to wear a mask upon entering the hospital. If you do not have a mask, a mask will be given to you at the Main Entrance upon arrival. For doctor visits, patients may have 1 support person age 31 or older with them. For treatment visits, patients can not have anyone with them due to social distancing guidelines and our immunocompromised population.

## 2023-07-04 ENCOUNTER — Ambulatory Visit: Payer: PPO

## 2023-07-04 DIAGNOSIS — I639 Cerebral infarction, unspecified: Secondary | ICD-10-CM

## 2023-07-04 LAB — CUP PACEART REMOTE DEVICE CHECK
Date Time Interrogation Session: 20250406230243
Implantable Pulse Generator Implant Date: 20210505

## 2023-07-05 DIAGNOSIS — N201 Calculus of ureter: Secondary | ICD-10-CM | POA: Diagnosis not present

## 2023-08-08 ENCOUNTER — Ambulatory Visit (INDEPENDENT_AMBULATORY_CARE_PROVIDER_SITE_OTHER): Payer: PPO

## 2023-08-08 DIAGNOSIS — I639 Cerebral infarction, unspecified: Secondary | ICD-10-CM

## 2023-08-08 LAB — CUP PACEART REMOTE DEVICE CHECK
Date Time Interrogation Session: 20250511233136
Implantable Pulse Generator Implant Date: 20210505

## 2023-08-14 ENCOUNTER — Ambulatory Visit: Payer: Self-pay | Admitting: Cardiology

## 2023-08-15 NOTE — Progress Notes (Signed)
 Carelink Summary Report / Loop Recorder

## 2023-08-19 ENCOUNTER — Other Ambulatory Visit (HOSPITAL_COMMUNITY): Payer: Self-pay | Admitting: Physician Assistant

## 2023-08-30 DIAGNOSIS — N202 Calculus of kidney with calculus of ureter: Secondary | ICD-10-CM | POA: Diagnosis not present

## 2023-08-31 ENCOUNTER — Encounter: Payer: Self-pay | Admitting: Cardiology

## 2023-09-08 ENCOUNTER — Ambulatory Visit (INDEPENDENT_AMBULATORY_CARE_PROVIDER_SITE_OTHER)

## 2023-09-08 DIAGNOSIS — I639 Cerebral infarction, unspecified: Secondary | ICD-10-CM

## 2023-09-08 LAB — CUP PACEART REMOTE DEVICE CHECK
Date Time Interrogation Session: 20250611230337
Implantable Pulse Generator Implant Date: 20210505

## 2023-09-13 ENCOUNTER — Ambulatory Visit: Payer: Self-pay | Admitting: Cardiology

## 2023-09-15 DIAGNOSIS — Z6841 Body Mass Index (BMI) 40.0 and over, adult: Secondary | ICD-10-CM | POA: Diagnosis not present

## 2023-09-15 DIAGNOSIS — Z23 Encounter for immunization: Secondary | ICD-10-CM | POA: Diagnosis not present

## 2023-09-15 DIAGNOSIS — E1142 Type 2 diabetes mellitus with diabetic polyneuropathy: Secondary | ICD-10-CM | POA: Diagnosis not present

## 2023-09-15 DIAGNOSIS — N1831 Chronic kidney disease, stage 3a: Secondary | ICD-10-CM | POA: Diagnosis not present

## 2023-09-15 DIAGNOSIS — I480 Paroxysmal atrial fibrillation: Secondary | ICD-10-CM | POA: Diagnosis not present

## 2023-09-15 DIAGNOSIS — M810 Age-related osteoporosis without current pathological fracture: Secondary | ICD-10-CM | POA: Diagnosis not present

## 2023-09-15 DIAGNOSIS — Z794 Long term (current) use of insulin: Secondary | ICD-10-CM | POA: Diagnosis not present

## 2023-09-15 DIAGNOSIS — I10 Essential (primary) hypertension: Secondary | ICD-10-CM | POA: Diagnosis not present

## 2023-09-15 DIAGNOSIS — Z0000 Encounter for general adult medical examination without abnormal findings: Secondary | ICD-10-CM | POA: Diagnosis not present

## 2023-09-15 DIAGNOSIS — E782 Mixed hyperlipidemia: Secondary | ICD-10-CM | POA: Diagnosis not present

## 2023-09-21 NOTE — Progress Notes (Signed)
 Carelink Summary Report / Loop Recorder

## 2023-10-04 ENCOUNTER — Inpatient Hospital Stay

## 2023-10-10 ENCOUNTER — Ambulatory Visit

## 2023-10-10 ENCOUNTER — Ambulatory Visit: Payer: Self-pay | Admitting: Cardiology

## 2023-10-10 DIAGNOSIS — I639 Cerebral infarction, unspecified: Secondary | ICD-10-CM

## 2023-10-10 LAB — CUP PACEART REMOTE DEVICE CHECK
Date Time Interrogation Session: 20250713231516
Implantable Pulse Generator Implant Date: 20210505

## 2023-10-11 ENCOUNTER — Inpatient Hospital Stay: Admitting: Hematology

## 2023-10-14 ENCOUNTER — Other Ambulatory Visit (HOSPITAL_COMMUNITY): Payer: Self-pay | Admitting: Physician Assistant

## 2023-11-01 NOTE — Progress Notes (Signed)
 Carelink Summary Report / Loop Recorder

## 2023-11-10 ENCOUNTER — Ambulatory Visit (INDEPENDENT_AMBULATORY_CARE_PROVIDER_SITE_OTHER)

## 2023-11-10 DIAGNOSIS — I639 Cerebral infarction, unspecified: Secondary | ICD-10-CM

## 2023-11-10 LAB — CUP PACEART REMOTE DEVICE CHECK
Date Time Interrogation Session: 20250813230550
Implantable Pulse Generator Implant Date: 20210505

## 2023-11-12 ENCOUNTER — Ambulatory Visit: Payer: Self-pay | Admitting: Cardiology

## 2023-11-22 ENCOUNTER — Ambulatory Visit (HOSPITAL_COMMUNITY)
Admission: RE | Admit: 2023-11-22 | Discharge: 2023-11-22 | Disposition: A | Discharge status: Home / Self Care | Payer: PPO | Source: Ambulatory Visit | Attending: Physician Assistant | Admitting: Physician Assistant

## 2023-11-22 VITALS — BP 162/64 | HR 47 | Ht 61.0 in | Wt 220.2 lb

## 2023-11-22 DIAGNOSIS — I480 Paroxysmal atrial fibrillation: Secondary | ICD-10-CM

## 2023-11-22 DIAGNOSIS — D6869 Other thrombophilia: Secondary | ICD-10-CM

## 2023-11-22 DIAGNOSIS — I4891 Unspecified atrial fibrillation: Secondary | ICD-10-CM | POA: Diagnosis not present

## 2023-11-22 DIAGNOSIS — Z5181 Encounter for therapeutic drug level monitoring: Secondary | ICD-10-CM

## 2023-11-22 DIAGNOSIS — Z79899 Other long term (current) drug therapy: Secondary | ICD-10-CM | POA: Diagnosis not present

## 2023-11-22 NOTE — Progress Notes (Signed)
 Primary Care Physician: Nanci Senior, MD Primary Cardiologist: Dr Anner Primary Electrophysiologist: Dr Inocencio Referring Physician: Dr Inocencio [name] is a 78 y.o. female with a history of DM, HTN, GERD, HLD, prior CVA, CHF, atrial fibrillation who presents for follow up in the [facility]. Patient was hospitalized for cryptogenic stroke on 07/31/19 and an ILR was placed by Dr Inocencio. The device clinic received an alert for two, 4-minute episodes of afib on 08/06/19.  She was unaware of her arrhythmia. She does state that since her stroke she has had issues with balance and nausea. She denies significant alcohol use or snoring. Started on flecainide  01/2022 for increased afib burden.   Patient returns for follow up for atrial fibrillation and flecainide  monitoring. She remains in SR today and feels well from a cardiac standpoint. No bleeding issues on anticoagulation. She has had a on and off headache for several weeks and plans to discuss with her PCP.   Today, she  denies symptoms of palpitations, chest pain, shortness of breath, orthopnea, PND, lower extremity edema, dizziness, presyncope, syncope, snoring, daytime somnolence, bleeding, or neurologic sequela. The patient is tolerating medications without difficulties and is otherwise without complaint today.    Atrial Fibrillation Risk Factors:  she does not have symptoms or diagnosis of sleep apnea. she does not have a history of rheumatic fever. she does not have a history of alcohol use. The patient does not have a history of Johnson familial atrial fibrillation or other arrhythmias.   Atrial Fibrillation Management history:  Previous antiarrhythmic drugs: flecainide   Previous cardioversions: none Previous ablations: none Anticoagulation history: Eliquis    Past Medical History:  Diagnosis Date   A-fib (HCC)    Actinic keratosis    Followed by Dr. Ivin   Anemia    Anxiety     Aortic stenosis    Diabetes mellitus, type II, insulin  dependent (HCC)    Associated with obesity: She takes Lantus , Victoza & Actos   Diplopia    Dysrhythmia    Afib Loop recorder in place   Essential hypertension    GERD (gastroesophageal reflux disease)    Heart murmur    History of kidney stones    Hyperlipidemia    Morbid obesity with BMI of 40.0-44.9, adult (HCC)    Stroke (HCC) 2021   x2    Current Outpatient Medications  Medication Sig Dispense Refill   acetaminophen  (TYLENOL ) 650 MG CR tablet Take 650-1,300 mg by mouth every 8 (eight) hours as needed for pain. (Patient taking differently: Take 650-1,300 mg by mouth as needed for pain.)     alendronate (FOSAMAX) 70 MG tablet Take 70 mg by mouth See admin instructions. Take 70 mg by mouth at bedtime every Saturday. Take with a full glass of water  on an empty stomach.     ALPRAZolam (XANAX) 0.25 MG tablet 1 tablet Orally once a day for 30 days As needed     apixaban  (ELIQUIS ) 5 MG TABS tablet TAKE 1 TABLET BY MOUTH IN THE MORNING AND 1 IN THE EVENING 180 tablet 1   ASTEPRO 205.5 MCG/SPRAY SOLN Place 1-2 sprays into both nostrils every 12 (twelve) hours as needed (for allergies or rhinitis).     atorvastatin  (LIPITOR) 40 MG tablet Take 1 tablet (40 mg total) by mouth daily. 30 tablet 2   blood glucose meter kit and supplies KIT Dispense based on patient and insurance preference. Use up to four times daily as directed. (FOR ICD-9  250.00, 250.01). 1 each 0   carvedilol  (COREG ) 12.5 MG tablet Take 1 tablet (12.5 mg total) by mouth 2 (two) times daily. 180 tablet 2   flecainide  (TAMBOCOR ) 50 MG tablet Take 1 tablet by mouth twice daily 180 tablet 0   furosemide  (LASIX ) 40 MG tablet Take 1 tablet (40 mg total) by mouth daily. (Patient taking differently: Take 40 mg by mouth 2 (two) times daily.) 90 tablet 3   Insulin  Syringe-Needle U-100 (RELION INSULIN  SYR .3CC/29G) 29G X 1/2 0.3 ML MISC 1 each by Does not apply route in the morning  and at bedtime. 100 each 0   Lancets (ONETOUCH DELICA PLUS LANCET33G) MISC USE 1 LANCET TO CHECK GLUCOSE UP TO 4 TIMES DAILY     LANTUS  SOLOSTAR 100 UNIT/ML Solostar Pen SMARTSIG:30 Unit(s) SUB-Q Every Morning     lisinopril -hydrochlorothiazide  (ZESTORETIC ) 20-25 MG tablet Take 1 tablet by mouth at bedtime.     nitroGLYCERIN  (NITROSTAT ) 0.4 MG SL tablet Place 1 tablet (0.4 mg total) under the tongue every 5 (five) minutes as needed for chest pain. 25 tablet 5   ONETOUCH VERIO test strip USE 1 STRIP TO CHECK GLUCOSE UP TO 4 TIMES DAILY AS DIRECTED     RELION PEN NEEDLES 31G X 6 MM MISC USE 1 IN THE MORNING AND AT BEDTIME     No current facility-administered medications for this encounter.    ROS- All systems are reviewed and negative except as per the HPI above.  Physical Exam: Vitals:   11/22/23 0908  BP: (!) 162/64  Pulse: (!) 47  Weight: 99.9 kg  Height: 5' 1 (1.549 m)     GEN: Well nourished, well developed in no acute distress CARDIAC: Regular rate and rhythm, no murmurs, rubs, gallops RESPIRATORY:  Clear to auscultation without rales, wheezing or rhonchi  ABDOMEN: Soft, non-tender, non-distended EXTREMITIES:  No edema; No deformity    Wt Readings from Last 3 Encounters:  11/22/23 99.9 kg  06/30/23 103.5 kg  06/28/23 97.1 kg    EKG today demonstrates  SB Vent. rate 47 BPM PR interval 182 ms QRS duration 86 ms QT/QTcB 462/408 ms   Echo 11/26/22 demonstrated   1. Left ventricular ejection fraction, by estimation, is 60 to 65%. The  left ventricle has normal function. The left ventricle has no regional  wall motion abnormalities. There is mild left ventricular hypertrophy.  Left ventricular diastolic parameters are consistent with Grade II diastolic dysfunction (pseudonormalization). Elevated left atrial pressure.   2. Right ventricular systolic function is normal. The right ventricular  size is mildly enlarged. There is moderately elevated pulmonary artery   systolic pressure. The estimated right ventricular systolic pressure is  49.3 mmHg.   3. Left atrial size was moderately dilated.   4. A small pericardial effusion is present.   5. The mitral valve is degenerative. Trivial mitral valve regurgitation.  Severe mitral annular calcification.   6. The aortic valve was not well visualized. There is moderate  calcification of the aortic valve. Aortic valve regurgitation is not  visualized. Mild to moderate aortic valve stenosis. Vmax 2.3 m/s, MG 11  mmHg, AVA 1.3 cm^2, DI 0.5   7. The inferior vena cava is dilated in size with <50% respiratory  variability, suggesting right atrial pressure of 15 mmHg.    Epic records are reviewed at length today  CHA2DS2-VASc Score = 8  The patient's score is based upon: CHF History: 1 HTN History: 1 Diabetes History: 1 Stroke History: 2 Vascular Disease  History: 0 Age Score: 2 Gender Score: 1       ASSESSMENT AND PLAN: Paroxysmal Atrial Fibrillation (ICD10:  I48.0) The patient's CHA2DS2-VASc score is 8, indicating a 10.8% annual risk of stroke.   ILR shows 0.1% afib burden Continue flecainide  50 mg BID Continue Eliquis  5 mg BID Continue carvedilol  12.5 mg BID  Secondary Hypercoagulable State (ICD10:  D68.69) The patient is at significant risk for stroke/thromboembolism based upon her CHA2DS2-VASc Score of 8.  Continue Apixaban  (Eliquis ). No bleeding issues.   High Risk Medication Monitoring (ICD 10: J342684) Patient requires ongoing monitoring for anti-arrhythmic medication which has the potential to cause life threatening arrhythmias. Intervals on ECG acceptable for flecainide  monitoring.     Obesity Body mass index is 41.61 kg/m.  Encouraged lifestyle modification  HTN Elevated today, patient will continue to monitor and discuss with her primary cardiology team.   Chronic HFpEF EF 60-65% GDMT per primary cardiology team Fluid status appears stable today   Follow up with Dr  Inocencio in 6 months. AF clinic in one year. Patient also due for follow up with her primary cardiology team.    Daril Kicks PA-C Afib Clinic [facility] [address] [place], [place] [address] [phone] 11/22/2023 2:26 PM

## 2023-11-22 NOTE — Patient Instructions (Signed)
 Will have scheduling reach out for Dr Anner appointment soon.    Follow up with Dr Inocencio in 6 months  Follow up with Ricky in 1 year

## 2023-12-05 ENCOUNTER — Ambulatory Visit: Admitting: Cardiology

## 2023-12-05 DIAGNOSIS — Z23 Encounter for immunization: Secondary | ICD-10-CM | POA: Diagnosis not present

## 2023-12-05 DIAGNOSIS — Z794 Long term (current) use of insulin: Secondary | ICD-10-CM | POA: Diagnosis not present

## 2023-12-05 DIAGNOSIS — E113391 Type 2 diabetes mellitus with moderate nonproliferative diabetic retinopathy without macular edema, right eye: Secondary | ICD-10-CM | POA: Diagnosis not present

## 2023-12-05 DIAGNOSIS — E1142 Type 2 diabetes mellitus with diabetic polyneuropathy: Secondary | ICD-10-CM | POA: Diagnosis not present

## 2023-12-05 DIAGNOSIS — Z6841 Body Mass Index (BMI) 40.0 and over, adult: Secondary | ICD-10-CM | POA: Diagnosis not present

## 2023-12-12 ENCOUNTER — Ambulatory Visit (INDEPENDENT_AMBULATORY_CARE_PROVIDER_SITE_OTHER)

## 2023-12-12 DIAGNOSIS — I639 Cerebral infarction, unspecified: Secondary | ICD-10-CM | POA: Diagnosis not present

## 2023-12-13 ENCOUNTER — Ambulatory Visit: Payer: Self-pay | Admitting: Cardiology

## 2023-12-13 DIAGNOSIS — H5203 Hypermetropia, bilateral: Secondary | ICD-10-CM | POA: Diagnosis not present

## 2023-12-13 DIAGNOSIS — E113293 Type 2 diabetes mellitus with mild nonproliferative diabetic retinopathy without macular edema, bilateral: Secondary | ICD-10-CM | POA: Diagnosis not present

## 2023-12-13 DIAGNOSIS — H353131 Nonexudative age-related macular degeneration, bilateral, early dry stage: Secondary | ICD-10-CM | POA: Diagnosis not present

## 2023-12-13 DIAGNOSIS — H25813 Combined forms of age-related cataract, bilateral: Secondary | ICD-10-CM | POA: Diagnosis not present

## 2023-12-13 LAB — CUP PACEART REMOTE DEVICE CHECK
Date Time Interrogation Session: 20250914230317
Implantable Pulse Generator Implant Date: 20210505

## 2023-12-14 NOTE — Progress Notes (Unsigned)
 Cardiology Office Note    Date:  12/16/2023  ID:  [name], DOB [birth year], MRN [id number] PCP:  Crecencio Chiquita POUR, FNP  Cardiologist:  Alm Clay, MD  Electrophysiologist:  Will Gladis Norton, MD   Chief Complaint: Follow-up for HFpEF/aortic stenosis   History of Present Illness: .    [name] is a 78 y.o. female with visit-pertinent history of chest pain, paroxysmal atrial fibrillation, HFpEF/aortic stenosis, carotid artery stenosis, hypertension, hyperlipidemia, type II DM, GERD, CVA.  Patient first evaluated by Dr. Clay in 12/2016 for evaluation of chest pain.  She underwent LHC which showed normal coronary arteries however spasm could not be excluded.  Upon follow-up patient was improved on beta-blocker and no further medication changes were made.  Patient presented the ED in 07/2019 with complaints of dizziness, imaging demonstrated multiple small acute right cerebral infarcts.  Echo showed normal LV/RV function with normal PA pressures, G1 DD, no significant valvular abnormalities.  Lower extremity ultrasound negative for DVT.  Loop recorder implanted in 07/2019 revealed PAF and patient was on anticoagulation.  Patient was last seen by Dr. Clay in 03/2023, she was doing well at the time did note occasional episodes of rapid heartbeats lasting for minutes but not exceeding 15 to 20 minutes.  She denied any significant chest pain or shortness of breath.  Patient was seen in A-fib clinic on 11/22/2023 by Quita Kicks, PA, ILR showed 0.1% A-fib burden, continued on flecainide , Eliquis  and carvedilol .  It was noted the patient was due for follow-up with her primary cardiology team, noted that blood pressure was slightly elevated.  Today she presents for follow-up with her son.  She reports that she has been doing very well overall.  She denies any chest pain, shortness of breath, reports that her lower extremity edema has resolved.  She denies any palpitations,  orthopnea or PND.  She denies any presyncope or syncope.  She does note some occasional headaches that were occurring on and off a few weeks ago, reports she has not had a recurrence in recent weeks, has been followed by her PCP regarding this.  Patient denies any cardiac concerns or complaints today.  ROS: .   Today she denies chest pain, shortness of breath, lower extremity edema, fatigue, palpitations, melena, hematuria, hemoptysis, diaphoresis, weakness, presyncope, syncope, orthopnea, and PND.  All other systems are reviewed and otherwise negative. Studies Reviewed: SABRA   EKG:  EKG is not ordered today.  CV Studies: Cardiac studies reviewed are outlined and summarized above. Otherwise please see EMR for full report. Cardiac Studies & Procedures   ______________________________________________________________________________________________ CARDIAC CATHETERIZATION  CARDIAC CATHETERIZATION 01/05/2017  Conclusion Images from the original result were not included.   Angiographically normal coronary arteries with a right dominant system.  The left ventricular systolic function is normal.  LV end diastolic pressure is normal.  The left ventricular ejection fraction is 55-65% by visual estimate.  There is no mitral valve regurgitation.  There is no aortic valve stenosis.  Angiographically normal coronary arteries premature excluding unstable angina. Cannot exclude coronary spasm however.   Plan: Return to short stay holding area of her TR band removal.  Anticipate discharge later today.  She'll follow-up as scheduled in order to discuss the possibility of treatment with calcium  channel blocker for possible spasm   Alm Clay, MD Alm Clay, M.D., M.S. Interventional Cardiologist  Pager # [phone] Phone # [phone] [address] [place], [place] [address]  Findings Coronary Findings Diagnostic  Dominance: Right  Left Main Vessel was injected.  Vessel is large. Vessel is angiographically normal.  Left Anterior Descending Vessel was injected. Vessel is normal in caliber. Vessel is angiographically normal.  First Diagonal Branch Vessel was injected. Vessel is small in size. Vessel is angiographically normal.  First Septal Branch Vessel was injected. Vessel is small in size. Vessel is angiographically normal.  Second Septal Branch Vessel was injected. Vessel is small in size. Vessel is angiographically normal.  Third Diagonal Branch Vessel was injected. Vessel is small in size. Vessel is angiographically normal.  Third Septal Branch Vessel was injected. Vessel is small in size. Vessel is angiographically normal.  Ramus Intermedius Vessel was injected. Vessel is normal in caliber. Vessel is angiographically normal.  Left Circumflex Vessel was injected. Vessel is moderate in size. Vessel is angiographically normal.  First Obtuse Marginal Branch Vessel was injected. Vessel is moderate in size. Vessel is angiographically normal.  Right Coronary Artery Vessel was injected. Vessel is large. Vessel is angiographically normal.  Acute Marginal Branch Vessel was injected. Vessel is small in size. Vessel is angiographically normal.  Inferior Septal Vessel was injected. Vessel is small in size. Vessel is angiographically normal.  Right Posterior Atrioventricular Artery Vessel was injected. Vessel is large in size. Vessel is angiographically normal.  First Right Posterolateral Branch Vessel was injected. Vessel is moderate in size. Vessel is angiographically normal.  Second Right Posterolateral Branch Vessel was injected. Vessel is moderate in size. Vessel is angiographically normal.  Intervention  No interventions have been documented.     ECHOCARDIOGRAM  ECHOCARDIOGRAM COMPLETE 11/26/2022  Narrative ECHOCARDIOGRAM REPORT    Patient Name:   [name] Date of Exam: 11/26/2022 Medical Rec #:  [id number]           Height:       62.0 in Accession #:    [id number]         Weight:       261.2 lb Date of Birth:  03-Mar-1946          BSA:          2.141 m Patient Age:    78 years           BP:           128/73 mmHg Patient Gender: F                  HR:           50 bpm. Exam Location:  Outpatient  Procedure: 2D Echo, Cardiac Doppler and Color Doppler  Indications:    atrial fibrillation  History:        Patient has prior history of Echocardiogram examinations, most recent 12/28/2021.  Sonographer:    Tinnie Barefoot RDCS Referring Phys: [id number] CLINT R [place]  IMPRESSIONS   1. Left ventricular ejection fraction, by estimation, is 60 to 65%. The left ventricle has normal function. The left ventricle has no regional wall motion abnormalities. There is mild left ventricular hypertrophy. Left ventricular diastolic parameters are consistent with Grade II diastolic dysfunction (pseudonormalization). Elevated left atrial pressure. 2. Right ventricular systolic function is normal. The right ventricular size is mildly enlarged. There is moderately elevated pulmonary artery systolic pressure. The estimated right ventricular systolic pressure is 49.3 mmHg. 3. Left atrial size was moderately dilated. 4. A small pericardial effusion is present. 5. The mitral valve is degenerative. Trivial mitral valve regurgitation. Severe mitral annular calcification. 6. The aortic valve was not well visualized. There is moderate calcification of the  aortic valve. Aortic valve regurgitation is not visualized. Mild to moderate aortic valve stenosis. Vmax 2.3 m/s, MG 11 mmHg, AVA 1.3 cm^2, DI 0.5 7. The inferior vena cava is dilated in size with <50% respiratory variability, suggesting right atrial pressure of 15 mmHg.  FINDINGS Left Ventricle: Left ventricular ejection fraction, by estimation, is 60 to 65%. The left ventricle has normal function. The left ventricle has no regional wall motion abnormalities. The left  ventricular internal cavity size was normal in size. There is mild left ventricular hypertrophy. Left ventricular diastolic parameters are consistent with Grade II diastolic dysfunction (pseudonormalization). Elevated left atrial pressure.  Right Ventricle: The right ventricular size is mildly enlarged. No increase in right ventricular wall thickness. Right ventricular systolic function is normal. There is moderately elevated pulmonary artery systolic pressure. The tricuspid regurgitant velocity is 2.93 m/s, and with an assumed right atrial pressure of 15 mmHg, the estimated right ventricular systolic pressure is 49.3 mmHg.  Left Atrium: Left atrial size was moderately dilated.  Right Atrium: Right atrial size was normal in size.  Pericardium: A small pericardial effusion is present.  Mitral Valve: The mitral valve is degenerative in appearance. Severe mitral annular calcification. Trivial mitral valve regurgitation. MV peak gradient, 11.8 mmHg. The mean mitral valve gradient is 3.0 mmHg.  Tricuspid Valve: The tricuspid valve is normal in structure. Tricuspid valve regurgitation is mild.  Aortic Valve: The aortic valve was not well visualized. There is moderate calcification of the aortic valve. Aortic valve regurgitation is not visualized. Mild to moderate aortic stenosis is present. Aortic valve mean gradient measures 11.0 mmHg. Aortic valve peak gradient measures 20.2 mmHg. Aortic valve area, by VTI measures 1.41 cm.  Pulmonic Valve: The pulmonic valve was not well visualized. Pulmonic valve regurgitation is trivial.  Aorta: The aortic root and ascending aorta are structurally normal, with no evidence of dilitation.  Venous: The inferior vena cava is dilated in size with less than 50% respiratory variability, suggesting right atrial pressure of 15 mmHg.  IAS/Shunts: The interatrial septum was not well visualized.   LEFT VENTRICLE PLAX 2D LVIDd:         5.10 cm     Diastology LVIDs:          2.90 cm     LV e' medial:    5.55 cm/s LV PW:         1.00 cm     LV E/e' medial:  31.9 LV IVS:        0.90 cm     LV e' lateral:   9.14 cm/s LVOT diam:     1.80 cm     LV E/e' lateral: 19.4 LV SV:         91 LV SV Index:   43 LVOT Area:     2.54 cm  LV Volumes (MOD) LV vol d, MOD A4C: 84.6 ml LV vol s, MOD A4C: 28.8 ml LV SV MOD A4C:     84.6 ml  RIGHT VENTRICLE RV Basal diam:  2.80 cm RV S prime:     12.60 cm/s TAPSE (M-mode): 2.1 cm  LEFT ATRIUM             Index        RIGHT ATRIUM           Index LA diam:        4.20 cm 1.96 cm/m   RA Area:     20.40 cm LA Vol (A2C):   93.0 ml 43.43  ml/m  RA Volume:   55.70 ml  26.01 ml/m LA Vol (A4C):   84.7 ml 39.55 ml/m LA Biplane Vol: 91.9 ml 42.92 ml/m AORTIC VALVE AV Area (Vmax):    1.40 cm AV Area (Vmean):   1.45 cm AV Area (VTI):     1.41 cm AV Vmax:           225.00 cm/s AV Vmean:          152.000 cm/s AV VTI:            0.646 m AV Peak Grad:      20.2 mmHg AV Mean Grad:      11.0 mmHg LVOT Vmax:         124.00 cm/s LVOT Vmean:        86.600 cm/s LVOT VTI:          0.359 m LVOT/AV VTI ratio: 0.55  AORTA Ao Root diam: 2.70 cm Ao Asc diam:  3.10 cm  MITRAL VALVE                TRICUSPID VALVE MV Area (PHT): 3.00 cm     TR Peak grad:   34.3 mmHg MV Area VTI:   1.41 cm     TR Vmax:        293.00 cm/s MV Peak grad:  11.8 mmHg MV Mean grad:  3.0 mmHg     SHUNTS MV Vmax:       1.72 m/s     Systemic VTI:  0.36 m MV Vmean:      81.7 cm/s    Systemic Diam: 1.80 cm MV Decel Time: 253 msec MV E velocity: 177.00 cm/s MV A velocity: 122.00 cm/s MV E/A ratio:  1.45  Lonni Nanas MD Electronically signed by Lonni Nanas MD Signature Date/Time: 11/28/2022/2:35:11 PM    Final          ______________________________________________________________________________________________       Current Reported Medications:.    No outpatient medications have been marked as taking for the  12/16/23 encounter (Office Visit) with Terita Hejl D, NP.    Physical Exam:    VS:  BP 134/64   Pulse (!) 53   Ht 5' 1 (1.549 m)   Wt 219 lb (99.3 kg)   SpO2 97%   BMI 41.38 kg/m    Wt Readings from Last 3 Encounters:  12/16/23 219 lb (99.3 kg)  11/22/23 220 lb 3.2 oz (99.9 kg)  06/30/23 228 lb 2.8 oz (103.5 kg)    GEN: Well nourished, well developed in no acute distress NECK: No JVD; No carotid bruits CARDIAC: RRR, 2/6 systolic murmur, no rubs or gallops RESPIRATORY:  Clear to auscultation without rales, wheezing or rhonchi  ABDOMEN: Soft, non-tender, non-distended EXTREMITIES:  No edema; No acute deformity     Asessement and Plan:.    HFpEF/aortic stenosis: Echo in 10/2022 indicated LVEF 60 to 65%, no RWMA, mild LVH, grade 2 diastolic dysfunction, RV systolic function and was normal, RV was mildly enlarged, moderately elevated pulmonary artery systolic pressures, trivial mitral valve regurgitation with severe mitral annular calcification, mild to moderate aortic valve stenosis, regurgitation was not visualized. Per Dr. Genice note patient with mild to moderate narrowing noted on echo however on his assessment valve gradient would not be in moderate category, recommended repeat echo in 2 to 3 years.  Today she denies chest pain, shortness of breath, lower extreme edema, orthopnea or PND.  She reports that her previously noted edema has since resolved,  she is only taking Lasix  as needed and has not taken in the last month.  She appears euvolemic and well compensated on exam.  Continue carvedilol  12.5 mg twice daily, lisinopril -hydrochlorothiazide  20-25 mg daily.  PAF: Patient with history of PAF, she has a ILR placed.  She has been maintained on flecainide , Eliquis  and carvedilol . Today she reports that she is doing well, denies any increased palpitations or feeling of atrial fibrillation.  She denies any bleeding problems on Eliquis .  Check CBC and CMET today. Continue Eliquis  5 mg  twice daily, carvedilol  12.5 mg twice daily and flecainide  50 mg twice daily per EP.  Hypertension: Blood pressure today 134/64.  Continue current antihypertensive regimen.  Hyperlipidemia: Patient reports that she is to have her fasting lipid profile collected by her PCP on Monday.  Continue Lipitor 40 mg daily.   Disposition: F/u with Dr. Anner in six months or sooner if needed.   Signed, Harley Mccartney D Nthony Lefferts, NP

## 2023-12-16 ENCOUNTER — Encounter: Payer: Self-pay | Admitting: Cardiology

## 2023-12-16 ENCOUNTER — Ambulatory Visit: Attending: Cardiology | Admitting: Cardiology

## 2023-12-16 VITALS — BP 134/64 | HR 53 | Ht 61.0 in | Wt 219.0 lb

## 2023-12-16 DIAGNOSIS — I350 Nonrheumatic aortic (valve) stenosis: Secondary | ICD-10-CM

## 2023-12-16 DIAGNOSIS — D6869 Other thrombophilia: Secondary | ICD-10-CM | POA: Diagnosis not present

## 2023-12-16 DIAGNOSIS — I480 Paroxysmal atrial fibrillation: Secondary | ICD-10-CM

## 2023-12-16 DIAGNOSIS — I10 Essential (primary) hypertension: Secondary | ICD-10-CM | POA: Diagnosis not present

## 2023-12-16 DIAGNOSIS — E785 Hyperlipidemia, unspecified: Secondary | ICD-10-CM

## 2023-12-16 LAB — CBC

## 2023-12-16 NOTE — Patient Instructions (Addendum)
 Medication Instructions:   Your physician recommends that you continue on your current medications as directed. Please refer to the Current Medication list given to you today.   *If you need a refill on your cardiac medications before your next appointment, please call your pharmacy*   Lab Work:   PLEASE GO DOWN STAIRS  [facility]  FIRST FLOOR   ( GET OFF ELEVATORS WALK TOWARDS WAITING AREA LAB LOCATED BY PHARMACY):   CMET AND CBC TODAY        If you have labs (blood work) drawn today and your tests are completely normal, you will receive your results only by: MyChart Message (if you have MyChart) OR A paper copy in the mail If you have any lab test that is abnormal or we need to change your treatment, we will call you to review the results.    Testing/Procedures: NONE ORDERED  TODAY    Follow-Up: At [facility], you and your health needs are our priority.  As part of our continuing mission to provide you with exceptional heart care, our providers are all part of one team.  This team includes your primary Cardiologist (physician) and Advanced Practice Providers or APPs (Physician Assistants and Nurse Practitioners) who all work together to provide you with the care you need, when you need it.  Your next appointment:   6 month(s)   Provider:   Alm Clay, MD   We recommend signing up for the patient portal called MyChart.  Sign up information is provided on this After Visit Summary.  MyChart is used to connect with patients for Virtual Visits (Telemedicine).  Patients are able to view lab/test results, encounter notes, upcoming appointments, etc.  Non-urgent messages can be sent to your provider as well.   To learn more about what you can do with MyChart, go to [email or web address].   Other Instructions

## 2023-12-17 LAB — COMPREHENSIVE METABOLIC PANEL WITH GFR
ALT: 24 IU/L (ref 0–32)
AST: 17 IU/L (ref 0–40)
Albumin: 3.8 g/dL (ref 3.8–4.8)
Alkaline Phosphatase: 90 IU/L (ref 49–135)
BUN/Creatinine Ratio: 28 (ref 12–28)
BUN: 27 mg/dL (ref 8–27)
Bilirubin Total: 0.6 mg/dL (ref 0.0–1.2)
CO2: 20 mmol/L (ref 20–29)
Calcium: 9 mg/dL (ref 8.7–10.3)
Chloride: 104 mmol/L (ref 96–106)
Creatinine, Ser: 0.95 mg/dL (ref 0.57–1.00)
Globulin, Total: 2.4 g/dL (ref 1.5–4.5)
Glucose: 190 mg/dL — ABNORMAL HIGH (ref 70–99)
Potassium: 4.7 mmol/L (ref 3.5–5.2)
Sodium: 139 mmol/L (ref 134–144)
Total Protein: 6.2 g/dL (ref 6.0–8.5)
eGFR: 61 mL/min/1.73 (ref >59)

## 2023-12-17 LAB — CBC
Hematocrit: 37.9 % (ref 34.0–46.6)
Hemoglobin: 11.9 g/dL (ref 11.1–15.9)
MCH: 31.7 pg (ref 26.6–33.0)
MCHC: 31.4 g/dL — ABNORMAL LOW (ref 31.5–35.7)
MCV: 101 fL — ABNORMAL HIGH (ref 79–97)
Platelets: 179 x10E3/uL (ref 150–450)
RBC: 3.75 x10E6/uL — ABNORMAL LOW (ref 3.77–5.28)
RDW: 12.7 % (ref 11.7–15.4)
WBC: 6.8 x10E3/uL (ref 3.4–10.8)

## 2023-12-19 DIAGNOSIS — E1142 Type 2 diabetes mellitus with diabetic polyneuropathy: Secondary | ICD-10-CM | POA: Diagnosis not present

## 2023-12-19 NOTE — Progress Notes (Signed)
 Remote Loop Recorder Transmission

## 2023-12-22 NOTE — Progress Notes (Signed)
 Remote Loop Recorder Transmission

## 2024-01-04 NOTE — Progress Notes (Signed)
 Remote Loop Recorder Transmission

## 2024-01-11 ENCOUNTER — Ambulatory Visit (INDEPENDENT_AMBULATORY_CARE_PROVIDER_SITE_OTHER)

## 2024-01-11 ENCOUNTER — Other Ambulatory Visit (HOSPITAL_COMMUNITY): Payer: Self-pay | Admitting: Physician Assistant

## 2024-01-11 DIAGNOSIS — I480 Paroxysmal atrial fibrillation: Secondary | ICD-10-CM | POA: Diagnosis not present

## 2024-01-12 ENCOUNTER — Encounter

## 2024-01-12 LAB — CUP PACEART REMOTE DEVICE CHECK
Date Time Interrogation Session: 20251014230501
Implantable Pulse Generator Implant Date: 20210505

## 2024-01-16 NOTE — Progress Notes (Signed)
 Remote Loop Recorder Transmission

## 2024-01-18 ENCOUNTER — Ambulatory Visit: Payer: Self-pay | Admitting: Cardiology

## 2024-02-11 ENCOUNTER — Encounter

## 2024-02-12 ENCOUNTER — Ambulatory Visit: Attending: Cardiology

## 2024-02-12 DIAGNOSIS — I639 Cerebral infarction, unspecified: Secondary | ICD-10-CM | POA: Diagnosis not present

## 2024-02-13 ENCOUNTER — Encounter

## 2024-02-13 LAB — CUP PACEART REMOTE DEVICE CHECK
Date Time Interrogation Session: 20251114230537
Implantable Pulse Generator Implant Date: 20210505

## 2024-02-14 ENCOUNTER — Ambulatory Visit: Payer: Self-pay | Admitting: Cardiology

## 2024-02-14 NOTE — Progress Notes (Signed)
 Remote Loop Recorder Transmission

## 2024-02-28 DIAGNOSIS — N200 Calculus of kidney: Secondary | ICD-10-CM | POA: Diagnosis not present

## 2024-03-13 ENCOUNTER — Encounter

## 2024-03-14 ENCOUNTER — Ambulatory Visit

## 2024-03-14 DIAGNOSIS — I639 Cerebral infarction, unspecified: Secondary | ICD-10-CM

## 2024-03-15 ENCOUNTER — Ambulatory Visit: Payer: Self-pay | Admitting: Cardiology

## 2024-03-15 ENCOUNTER — Encounter

## 2024-03-15 LAB — CUP PACEART REMOTE DEVICE CHECK
Date Time Interrogation Session: 20251216230405
Implantable Pulse Generator Implant Date: 20210505

## 2024-03-15 NOTE — Progress Notes (Signed)
 Remote Loop Recorder Transmission

## 2024-03-20 ENCOUNTER — Other Ambulatory Visit (HOSPITAL_COMMUNITY): Payer: Self-pay | Admitting: Physician Assistant

## 2024-04-13 ENCOUNTER — Encounter

## 2024-04-14 ENCOUNTER — Ambulatory Visit: Attending: Cardiology

## 2024-04-14 DIAGNOSIS — I480 Paroxysmal atrial fibrillation: Secondary | ICD-10-CM

## 2024-04-16 ENCOUNTER — Encounter

## 2024-04-17 ENCOUNTER — Ambulatory Visit: Payer: Self-pay | Admitting: Cardiology

## 2024-04-17 LAB — CUP PACEART REMOTE DEVICE CHECK
Date Time Interrogation Session: 20260116230508
Implantable Pulse Generator Implant Date: 20210505

## 2024-04-19 NOTE — Progress Notes (Signed)
 Remote Loop Recorder Transmission

## 2024-05-14 ENCOUNTER — Encounter

## 2024-05-15 ENCOUNTER — Ambulatory Visit

## 2024-05-17 ENCOUNTER — Encounter

## 2024-06-14 ENCOUNTER — Encounter

## 2024-06-15 ENCOUNTER — Ambulatory Visit

## 2024-06-18 ENCOUNTER — Encounter

## 2024-07-15 ENCOUNTER — Encounter

## 2024-07-16 ENCOUNTER — Ambulatory Visit

## 2024-07-19 ENCOUNTER — Encounter
